# Patient Record
Sex: Male | Born: 1955
Health system: Southern US, Community
[De-identification: ages and names within clinical notes are randomized; demographics above are authoritative.]

## PROBLEM LIST (undated history)

## (undated) DIAGNOSIS — F17200 Nicotine dependence, unspecified, uncomplicated: Secondary | ICD-10-CM

## (undated) DIAGNOSIS — M1990 Unspecified osteoarthritis, unspecified site: Secondary | ICD-10-CM

## (undated) DIAGNOSIS — I10 Essential (primary) hypertension: Secondary | ICD-10-CM

## (undated) DIAGNOSIS — E785 Hyperlipidemia, unspecified: Secondary | ICD-10-CM

## (undated) DIAGNOSIS — G4730 Sleep apnea, unspecified: Secondary | ICD-10-CM

## (undated) DIAGNOSIS — R079 Chest pain, unspecified: Secondary | ICD-10-CM

## (undated) DIAGNOSIS — G510 Bell's palsy: Secondary | ICD-10-CM

## (undated) HISTORY — DX: Unspecified osteoarthritis, unspecified site: M19.90

## (undated) HISTORY — DX: Sleep apnea, unspecified: G47.30

## (undated) HISTORY — DX: Nicotine dependence, unspecified, uncomplicated: F17.200

## (undated) HISTORY — DX: Bell's palsy: G51.0

## (undated) HISTORY — DX: Essential (primary) hypertension: I10

## (undated) HISTORY — DX: Hyperlipidemia, unspecified: E78.5

## (undated) HISTORY — PX: NECK SURGERY: SHX720

## (undated) HISTORY — DX: Chest pain, unspecified: R07.9

---

## 1990-03-23 HISTORY — PX: WRIST SURGERY: SHX841

## 1998-01-29 ENCOUNTER — Ambulatory Visit (HOSPITAL_BASED_OUTPATIENT_CLINIC_OR_DEPARTMENT_OTHER): Admission: RE | Admit: 1998-01-29 | Discharge: 1998-01-29 | Payer: Self-pay

## 1998-04-22 ENCOUNTER — Ambulatory Visit (HOSPITAL_BASED_OUTPATIENT_CLINIC_OR_DEPARTMENT_OTHER): Admission: RE | Admit: 1998-04-22 | Discharge: 1998-04-22 | Payer: Self-pay

## 1998-04-23 HISTORY — PX: HERNIA REPAIR: SHX51

## 1999-07-09 ENCOUNTER — Emergency Department (HOSPITAL_COMMUNITY): Admission: EM | Admit: 1999-07-09 | Discharge: 1999-07-09 | Payer: Self-pay | Admitting: Emergency Medicine

## 2002-05-22 ENCOUNTER — Ambulatory Visit (HOSPITAL_COMMUNITY): Admission: RE | Admit: 2002-05-22 | Discharge: 2002-05-22 | Payer: Self-pay | Admitting: Sports Medicine

## 2002-05-22 ENCOUNTER — Encounter: Payer: Self-pay | Admitting: Chiropractic Medicine

## 2002-06-20 ENCOUNTER — Inpatient Hospital Stay (HOSPITAL_COMMUNITY): Admission: RE | Admit: 2002-06-20 | Discharge: 2002-06-21 | Payer: Self-pay | Admitting: Neurosurgery

## 2002-06-20 ENCOUNTER — Encounter: Payer: Self-pay | Admitting: Neurosurgery

## 2004-10-26 ENCOUNTER — Emergency Department (HOSPITAL_COMMUNITY): Admission: EM | Admit: 2004-10-26 | Discharge: 2004-10-26 | Payer: Self-pay | Admitting: Emergency Medicine

## 2005-11-18 ENCOUNTER — Emergency Department (HOSPITAL_COMMUNITY): Admission: EM | Admit: 2005-11-18 | Discharge: 2005-11-18 | Payer: Self-pay | Admitting: Family Medicine

## 2005-12-22 ENCOUNTER — Ambulatory Visit (HOSPITAL_COMMUNITY): Admission: RE | Admit: 2005-12-22 | Discharge: 2005-12-22 | Payer: Self-pay | Admitting: Chiropractic Medicine

## 2006-01-22 ENCOUNTER — Ambulatory Visit (HOSPITAL_COMMUNITY): Admission: RE | Admit: 2006-01-22 | Discharge: 2006-01-23 | Payer: Self-pay | Admitting: Orthopaedic Surgery

## 2008-06-21 ENCOUNTER — Emergency Department (HOSPITAL_COMMUNITY): Admission: EM | Admit: 2008-06-21 | Discharge: 2008-06-21 | Payer: Self-pay | Admitting: Emergency Medicine

## 2008-06-21 DIAGNOSIS — G510 Bell's palsy: Secondary | ICD-10-CM

## 2008-06-21 HISTORY — DX: Bell's palsy: G51.0

## 2010-04-12 ENCOUNTER — Encounter: Payer: Self-pay | Admitting: Chiropractic Medicine

## 2010-08-08 NOTE — Op Note (Signed)
Shaun Lang, HOPFENSPERGER                ACCOUNT NO.:  [id number]   MEDICAL RECORD NO.:  [id number]          PATIENT TYPE:  OIB   LOCATION:  5015                         FACILITY:  [facility]   PHYSICIAN:  Sharolyn Douglas, M.D.        DATE OF BIRTH:  02-Mar-1956   DATE OF PROCEDURE:  01/22/2006  DATE OF DISCHARGE:  01/23/2006                                 OPERATIVE REPORT   DIAGNOSIS:  Cervical spondylotic myeloradiculopathy C6-C7 and C7-T1 below  the previous C5-C6 fusion.   PROCEDURE:  1. Exploration of C5-C6 fusion.  2. Anterior cervical discectomy C6-C7 and C7-T1 with decompression of the      spinal cord and nerve roots bilaterally.  3. Anterior cervical arthrodesis C6-C7 and C7-T1 with placement of two      allograft prosthesis spacers packed with local autogenous bone graft.  4. Anterior cervical plating C6 to T1 using the Abbott spine system.   SURGEON:  Sharolyn Douglas, M.D.   ASSISTANT:  Colleen Mahar, P.A.-C.   ANESTHESIA:  General endotracheal.   ESTIMATED BLOOD LOSS:  Minimal.   COMPLICATIONS:  None.   COUNTS:  Needle and sponge count correct.   INDICATIONS:  The patient is a pleasant 55 year old male with progressive  neck and bilateral upper extremity pain consistent with a  myeloradiculopathy.  He has had a previous C5-C6 fusion done by another  surgeon using a BAK cage.  He has developed severe adjacent degenerative  changes with spinal stenosis and foraminal narrowing.  It is unclear from  his radiographs whether the fusion at C5-C6 is fused.  He now presents for  exploration of the fusion at C5-C6, extension of the decompression and  fusion across C6-C7 and C7-T1 anteriorly.  The risks, benefits, and  alternatives were reviewed and he elected to proceed.   DESCRIPTION OF PROCEDURE:  After informed consent, he was taken to the  operating room.  He underwent general endotracheal anesthesia without  difficulty and given prophylactic IV antibiotics.  He was carefully  positioned with his neck in slight extension.  5 pounds of halter traction  applied.  The neck was prepped and draped in the usual sterile fashion.  A  transverse incision was made just below the cricoid cartilage.  Dissection  was carried sharply through the platysma muscle in a longitudinal fashion.  There was quite a bit of scar from the previous operation and great care was  taken to develop the plane between the SCM and strap muscles medially.  The  prevertebral space was entered and, again, there was scarring noted from the  previous operation.  This was dissected bluntly.  We then exposed the  anterior cervical spine.  There was an osteophyte at C6-C7 which was easily  identifiable.  At C5-C6, the previous anterior cervical fusion could be  identified.  A spinal needle was placed in C6-C7 to confirm the level.  We  then completed careful exposure of the previous fusion at C5-C6.  We found  that there was some bridging bone across the disc space anteriorly and could  not identify any evidence  of a pseudoarthrosis.  We then completed  additional exposure of the C6-C7 and C7-T1 disc spaces by elevating the  longus coli muscle.  Deep Shadowline retractors were placed.  The esophagus,  trachea, and carotid sheath were identified and protected at all times.  Caspar distraction pins were placed in the C6, C7 and T1 vertebral bodies.  Gentle distraction applied.  The microscope was draped and brought into the  field.  The remainder of the operation was done directly under the  microscope.  We then completed discectomies at C6-C7 and C7-T1.  At C6-C7,  the disc was extremely narrowed and degenerative.  The uncovertebral joints  and posterior vertebral margins were taken down using a high speed bur.  This bone was saved for later autografting.  We then completed wide  foraminotomies using the micro-Kerrison punches.  The posterior longitudinal  ligament was taken down.  Once we were satisfied  with the decompression, we  placed a 6-mm allograft prosthesis spacer which had been packed with local  bone graft obtained from the drill.  This was countersunk 2 mm into the  interspace.  We had good distraction.  We then performed a similar procedure  at C7-T1.  At this level, the disc space was a little bit higher, although  still quite degenerative.  The uncovertebral joints were drilled out and  wide foraminotomies were completed, the posterior longitudinal ligament  taken down, and the spinal canal decompressed.  We placed a 7 mm allograft  prosthesis spacer packed with local bone graft at this level and again  countersunk it 2 mm.  We then turned our attention to placing an anterior  cervical plate from C6 to T1 which measured 44 mm.  We utilized six 13 mm  screws with good purchase.  We ensured that the locking mechanism engaged.  Hemostasis was achieved.  A deep Penrose drain was left in place.  The  platysma was closed with interrupted 2-0 Vicryl, the subcutaneous layer  closed with interrupted 3-0 Vicryl, followed by a running 4-0 subcuticular  Vicryl suture on the skin edges.  Benzoin and Steri-Strips placed.  A  sterile dressing was applied.  The patient was then transferred to recovery  in stable condition able to move his upper and lower extremities.   It should be noted my assistant [facility], P.A.-C., was present  throughout the procedure including the positioning, the exposure, the  decompression, the instrumentation and she also assisted with wound closure.      Sharolyn Douglas, M.D.  Electronically Signed     MC/MEDQ  D:  01/22/2006  T:  01/23/2006  Job:  [id number]

## 2010-08-08 NOTE — Op Note (Signed)
Shaun Lang, Shaun Lang                          ACCOUNT NO.:  [id number]   MEDICAL RECORD NO.:  [id number]                   PATIENT TYPE:  INP   LOCATION:  3172                                 FACILITY:  [facility]   PHYSICIAN:  Clydene Fake, M.D.               DATE OF BIRTH:  1955/10/18   DATE OF PROCEDURE:  06/20/2002  DATE OF DISCHARGE:                                 OPERATIVE REPORT   PREOPERATIVE DIAGNOSIS:  Herniated nucleus pulposus, spondylosis, cord  compression, myelopathy.   POSTOPERATIVE DIAGNOSIS:  Herniated nucleus pulposus, spondylosis, cord  compression, myelopathy.   OPERATION PERFORMED:  Anterior cervical decompression and diskectomy with  fusion at C5-6 with a BAK/C cage.   SURGEON:  Clydene Fake, M.D.   ASSISTANT:  Danae Orleans. Venetia Maxon, M.D.   ANESTHESIA:  General endotracheal tube.   ESTIMATED BLOOD LOSS:  None.   BLOOD REPLACED:  None.   DRAINS:  None.   COMPLICATIONS:  None.   INDICATIONS FOR PROCEDURE:  The patient is a 55 year old right-handed  gentleman with a neck injury four or five weeks ago and has posterior neck  pain with trouble moving his right arm, moving his fingers, some burning  like pain down the arm and some problems __________ gait.  Some of burning  pains resolved, the weakness persists.  MRI was done showing cord contusion  at 5-6 with severe stenosis at that level due to the spondylosis and HNP.  The patient brought in for decompression and fusion at this level.   DESCRIPTION OF PROCEDURE:  The patient was brought to the operating room and  general anesthesia induced.  The patient was placed in halter traction with  10 pounds, prepped and draped in the usual sterile fashion.  Site of  incision was injected with 10 ml of 1% lidocaine with epinephrine.  An  incision was then made in the left side of the neck from the midline to the  anterior border of the sternocleidomastoid muscle and incision taken down to  the platysma and  hemostasis obtained with Bovie cauterization.  The platysma  was incised with the Bovie with blunt dissection taken from the anterior  cervical fascia to the anterior cervical spine.  Needle was placed in the  interspace.  Fluoroscopy was used and this confirmed we were at the 5-6  level.  The disk space was incised with a 16 blade.  The needle was removed.  The longus colli muscle was reflected laterally on each side using the Bovie  and self-retaining retractor system placed.  The osteophytes over the disk  space were then removed with an osteophyte remover.  Partial diskectomy  performed with pituitary rongeurs.  Distraction pins were placed in the C5  and 6 and the interspace distracted as we started the diskectomy.  Using  high speed drill, curets and pituitary rongeurs, diskectomy was performed  taking it down to posterior  longitudinal ligament.  The space was extremely  deep.  A high speed drill was used to remove the cartilaginous end plate on  each side opening up the interspace.  The posterior longitudinal ligament  along with posterior osteophytes was removed with 1 to 2 mm Kerrison  punches.  A 1 mm Kerrison punch broke, the small end, was there in the  epidural space as we were attempting to pull it out.  It was sucked up the  sucker and was not found.  At the end of the case, x-rays showed no metal in  the epidural space at that level.  After a good decompression of the central  thecal sac and bilateral foramen with the Kerrison punches, the wound was  irrigated with antibiotic solution.  The interspace was measured and a 7 mm  paddle for a 10 mm cage was tapped into place over the 5-6 interspace for a  BAK/C cage.  The interspace was reamed down to 20 mm.  We still had plenty  of bone left, about 30 mm, the vertebral body.  Both end plates were fairly  symmetrical in the reaming.  All bone was shaved and placed into a 10 mm  BAK/C cage.  The interspace was tapped and then the  cage was placed down the  __________  was then removed and the cage was put in the final position and  countersunk 3 mm.  Fluoroscopic imaging AP and lateral was done showing good  position of the cage.  The wound was irrigated with antibiotic solution.  Retractors were removed with good hemostasis and the platysma was closed  with 3-0 Vicryl interrupted sutures, the subcutaneous tissue was closed with  the same, the skin closed with Benzoin and Steri-Strips.  Dry sterile  dressing was placed.  Patient was placed in a soft sterile collar and gently  awoke from anesthesia and transferred to recovery room in stable condition.                                               Clydene Fake, M.D.    JRH/MEDQ  D:  06/20/2002  T:  06/20/2002  Job:  [id number]

## 2011-12-23 ENCOUNTER — Encounter (INDEPENDENT_AMBULATORY_CARE_PROVIDER_SITE_OTHER): Payer: Self-pay | Admitting: General Surgery

## 2011-12-23 ENCOUNTER — Ambulatory Visit (INDEPENDENT_AMBULATORY_CARE_PROVIDER_SITE_OTHER): Payer: Medicaid Other | Admitting: General Surgery

## 2011-12-23 VITALS — BP 130/86 | HR 70 | Temp 99.4°F | Ht 68.0 in | Wt 192.0 lb

## 2011-12-23 DIAGNOSIS — R1032 Left lower quadrant pain: Secondary | ICD-10-CM

## 2011-12-23 MED ORDER — PREGABALIN 75 MG PO CAPS: 75.0000 mg | ORAL_CAPSULE | Freq: Two times a day (BID) | ORAL | Status: DC

## 2011-12-23 NOTE — Progress Notes (Signed)
Subjective:     Patient ID: Shaun Lang, male   DOB: 1955/10/01, 56 y.o.   MRN: [id number]  HPI Patient is status post left inguinal hernia repair with mesh by Dr. Orpah Greek In 2000. About 6 months to a year ago he did develop pain around his external inguinal canal. It comes and goes. He has not noticed a mass.He's had no change in bowel or bladder habits. The pain is localized in his left inguinal region medially. It is exacerbated by laying on his left side.  Review of Systems  Constitutional: Negative for fever, chills and unexpected weight change.  HENT: Negative for hearing loss, congestion, sore throat, trouble swallowing and voice change.   Eyes: Negative for visual disturbance.  Respiratory: Negative for cough and wheezing.   Cardiovascular: Negative for chest pain, palpitations and leg swelling.  Gastrointestinal: Negative for nausea, vomiting, abdominal pain, diarrhea, constipation, blood in stool, abdominal distention, anal bleeding and rectal pain.       See history of present illness  Genitourinary: Negative for hematuria and difficulty urinating.  Musculoskeletal: Negative for arthralgias.  Skin: Negative for rash and wound.  Neurological: Negative for seizures, syncope, weakness and headaches.       Chronic pain from cervical disc disease  Hematological: Negative for adenopathy. Does not bruise/bleed easily.  Psychiatric/Behavioral: Negative for confusion.       Objective:   Physical Exam  Constitutional: He is oriented to person, place, and time. He appears well-developed and well-nourished.  HENT:  Head: Normocephalic and atraumatic.  Eyes: EOM are normal. Pupils are equal, round, and reactive to light.  Neck: Normal range of motion. Neck supple.  Cardiovascular: Normal rate, regular rhythm and intact distal pulses.   Pulmonary/Chest: Effort normal. No respiratory distress. He has no wheezes. He has no rales.  Abdominal: Soft. He exhibits no distension. There is no  tenderness. There is no rebound.       Inguinal exam reveals no evidence of left inguinal hernia. There is tenderness over his external ring area. No masses felt. Both testes are descended. No evidence of right inguinal hernia.  Genitourinary: Penis normal. No penile tenderness.  Musculoskeletal: Normal range of motion.  Neurological: He is alert and oriented to person, place, and time.       Assessment:     Left inguinal pain likely due to nerve inflammation with scar tissue from previous left renal hernia repair with mesh   Plan:     Lyrica 75 mg by mouth twice a day. I cautioned the patient to see how it affects him before he drives. This is usually effective in decreasing this nerve inflammation and pain. I will see him back in 6 weeks. We will plan a 2 month course of Lyrica.

## 2011-12-24 ENCOUNTER — Ambulatory Visit (INDEPENDENT_AMBULATORY_CARE_PROVIDER_SITE_OTHER): Payer: Medicaid Other | Admitting: General Surgery

## 2012-01-13 ENCOUNTER — Other Ambulatory Visit (INDEPENDENT_AMBULATORY_CARE_PROVIDER_SITE_OTHER): Payer: Self-pay | Admitting: General Surgery

## 2012-01-13 MED ORDER — PREGABALIN 75 MG PO CAPS: 75.0000 mg | ORAL_CAPSULE | Freq: Two times a day (BID) | ORAL | Status: DC

## 2012-02-03 ENCOUNTER — Ambulatory Visit (INDEPENDENT_AMBULATORY_CARE_PROVIDER_SITE_OTHER): Payer: Medicaid Other | Admitting: General Surgery

## 2012-02-03 ENCOUNTER — Encounter (INDEPENDENT_AMBULATORY_CARE_PROVIDER_SITE_OTHER): Payer: Self-pay | Admitting: General Surgery

## 2012-02-03 VITALS — BP 140/90 | HR 74 | Temp 97.0°F | Resp 18 | Ht 68.0 in | Wt 189.8 lb

## 2012-02-03 DIAGNOSIS — R1032 Left lower quadrant pain: Secondary | ICD-10-CM

## 2012-02-03 MED ORDER — PREGABALIN 75 MG PO CAPS: 75.0000 mg | ORAL_CAPSULE | Freq: Two times a day (BID) | ORAL | Status: DC

## 2012-02-03 NOTE — Progress Notes (Signed)
Subjective:     Patient ID: Shaun Lang, male   DOB: December 21, 1955, 56 y.o.   MRN: [id number]  HPI Patient is status post left anal hernia repair by Dr. Orpah Greek in 2000. I saw him last month for some left inguinal pain the seems neuropathic. He started on Lyrica. He is tolerating this fairly well. The pain is reduced but not completely resolved.  Review of Systems     Objective:   Physical Exam  Constitutional: He is oriented to person, place, and time. He appears well-developed and well-nourished.  Pulmonary/Chest: Effort normal. No respiratory distress.  Neurological: He is alert and oriented to person, place, and time.  Skin: Skin is warm and dry.  Inguinal exam reveals no evidence of right inguinal hernia. Both testes are descended. No evidence of left inguinal hernia. There is pain palpating over his canal and on the left lateral aspect of his pubic tubercle.     Assessment:     Left inguinal pain status post left inguinal hernia repair with mesh likely due to nerve inflammation with scar tissue, improving on their    Plan:     I refilled his Lyrica and I will see him back in 6 weeks. Hopefully this will completely resolve. Otherwise we will pursue physical therapy or pain medicine referral.

## 2012-03-09 ENCOUNTER — Encounter (INDEPENDENT_AMBULATORY_CARE_PROVIDER_SITE_OTHER): Payer: Self-pay | Admitting: General Surgery

## 2012-03-09 ENCOUNTER — Ambulatory Visit (INDEPENDENT_AMBULATORY_CARE_PROVIDER_SITE_OTHER): Payer: Medicaid Other | Admitting: General Surgery

## 2012-03-09 VITALS — BP 130/82 | HR 72 | Temp 98.2°F | Resp 18 | Ht 68.0 in | Wt 183.0 lb

## 2012-03-09 DIAGNOSIS — R1032 Left lower quadrant pain: Secondary | ICD-10-CM

## 2012-03-09 NOTE — Progress Notes (Signed)
Subjective:     Patient ID: Shaun Lang, male   DOB: 08-20-1955, 56 y.o.   MRN: [id number]  HPI Patient status post left inguinal hernia repair by Dr. Rosamaria Lints. He developed some neuropathic pain. It is improved but not resolved on Lyrica.  Review of Systems     Objective:   Physical Exam Awake and alert Abdomen soft and nontender. Left inguinal exam reveals no evidence of recurrent left ankle hernia, there is tenderness at the external canal on palpation, no testicular tenderness or swelling, no evidence of infection    Assessment:     Left inguinal pain with history of left ankle hernia repair in the past, likely neuropathic in origin    Plan:     We will refer for physical therapy. I also offered pain clinic referral for injections. Patient would prefer to try physical therapy option. There is a local therapy office that offers this treatment. I will contact him and make the arrangements.  Return in 2 months.

## 2012-03-10 ENCOUNTER — Other Ambulatory Visit: Payer: Self-pay | Admitting: General Surgery

## 2012-03-10 ENCOUNTER — Telehealth (INDEPENDENT_AMBULATORY_CARE_PROVIDER_SITE_OTHER): Payer: Self-pay

## 2012-03-10 DIAGNOSIS — R1032 Left lower quadrant pain: Secondary | ICD-10-CM

## 2012-03-10 NOTE — Telephone Encounter (Signed)
I called and let the pt know that Dr Janee Morn ordered outpt therapy for his inguinal pain.  The referral is made.  Their # is [phone].  I asked the pt to call.

## 2012-05-11 ENCOUNTER — Other Ambulatory Visit (INDEPENDENT_AMBULATORY_CARE_PROVIDER_SITE_OTHER): Payer: Self-pay

## 2012-05-11 ENCOUNTER — Ambulatory Visit (INDEPENDENT_AMBULATORY_CARE_PROVIDER_SITE_OTHER): Payer: Medicaid Other | Admitting: General Surgery

## 2012-05-11 ENCOUNTER — Encounter (INDEPENDENT_AMBULATORY_CARE_PROVIDER_SITE_OTHER): Payer: Self-pay | Admitting: General Surgery

## 2012-05-11 VITALS — BP 128/82 | HR 66 | Temp 97.2°F | Resp 18 | Ht 68.0 in | Wt 186.0 lb

## 2012-05-11 DIAGNOSIS — R1030 Lower abdominal pain, unspecified: Secondary | ICD-10-CM

## 2012-05-11 DIAGNOSIS — R1032 Left lower quadrant pain: Secondary | ICD-10-CM

## 2012-05-11 MED ORDER — PREGABALIN 75 MG PO CAPS: 75.0000 mg | ORAL_CAPSULE | Freq: Two times a day (BID) | ORAL | Status: DC

## 2012-05-11 NOTE — Progress Notes (Addendum)
Subjective:     Patient ID: Shaun Lang, male   DOB: August 22, 1955, 57 y.o.   MRN: [id number]  HPI Patient is here for followup for left inguinal pain. 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 I referred him for physical therapy. He chose not to go. He is been taking Lyrica 75 mg twice a day with some improvement but not complete relief. He still having some throbbing intermittent pain in his left groin medially near his pubic tubercle.  Review of Systems     Objective:   Physical Exam  Constitutional: He appears well-developed and well-nourished. No distress.  Cardiovascular: Normal rate.   Pulmonary/Chest: Effort normal. No respiratory distress.  Abdominal: Soft. He exhibits no distension and no mass. There is no tenderness. There is no rebound and no guarding.  Right inguinal exam reveals no evidence of inguinal hernia, left inguinal exam reveals no evidence of recurrent left inguinal hernia, there is some tenderness lateral to his pubic tubercle  Genitourinary: Penis normal.       Assessment:     Left internal pain with history of left inguinal hernia repair, likely neuropathic, incomplete response to Lyrica    Plan:     I discussed the benefits of physical therapy again with the patient. I was offered referral to a pain clinic for injection. He has agreed to this and we will arrange. We also discussed the possibility of surgery to cut the nerve. He would like to avoid that. I refilled his Lyrica. I will see him back in 6 weeks. We will contact him tomorrow with the appointment time for his injection.

## 2012-05-11 NOTE — Patient Instructions (Addendum)
We will contact you tomorrow regarding pain clinic referral

## 2012-05-12 ENCOUNTER — Telehealth (INDEPENDENT_AMBULATORY_CARE_PROVIDER_SITE_OTHER): Payer: Self-pay

## 2012-05-12 NOTE — Telephone Encounter (Signed)
I called the pt and let him know that the pain clinic referral was made and I sent all his records to them.  They will call him with an appointment.

## 2012-05-16 ENCOUNTER — Telehealth (INDEPENDENT_AMBULATORY_CARE_PROVIDER_SITE_OTHER): Payer: Self-pay

## 2012-05-16 NOTE — Telephone Encounter (Signed)
I received a fax from [facility] for refill on Lyrica  75mg  po bid #60 and that I need to get prior authorization.  I got the ok from Dr Janee Morn to refill plus one additional.  I called [phone] to get prior authorization.  They gave me a confirmation # of [id number] and said I could check on that in 24 hrs.  I called in the refill and auth # to [facility] [id number].

## 2012-05-18 ENCOUNTER — Encounter: Payer: Self-pay | Admitting: Physical Medicine & Rehabilitation

## 2012-06-01 ENCOUNTER — Ambulatory Visit: Payer: Self-pay | Admitting: Physical Medicine & Rehabilitation

## 2012-06-01 ENCOUNTER — Encounter: Payer: Medicaid Other | Attending: Physical Medicine & Rehabilitation | Admitting: Physical Medicine & Rehabilitation

## 2012-06-01 ENCOUNTER — Encounter: Payer: Self-pay | Admitting: Physical Medicine & Rehabilitation

## 2012-06-01 VITALS — BP 128/77 | HR 83 | Resp 17 | Ht 68.0 in | Wt 185.0 lb

## 2012-06-01 DIAGNOSIS — G5792 Unspecified mononeuropathy of left lower limb: Secondary | ICD-10-CM

## 2012-06-01 DIAGNOSIS — G8928 Other chronic postprocedural pain: Secondary | ICD-10-CM | POA: Insufficient documentation

## 2012-06-01 DIAGNOSIS — R109 Unspecified abdominal pain: Secondary | ICD-10-CM | POA: Insufficient documentation

## 2012-06-01 DIAGNOSIS — E785 Hyperlipidemia, unspecified: Secondary | ICD-10-CM | POA: Insufficient documentation

## 2012-06-01 DIAGNOSIS — G5790 Unspecified mononeuropathy of unspecified lower limb: Secondary | ICD-10-CM

## 2012-06-01 DIAGNOSIS — I10 Essential (primary) hypertension: Secondary | ICD-10-CM | POA: Insufficient documentation

## 2012-06-01 DIAGNOSIS — F17200 Nicotine dependence, unspecified, uncomplicated: Secondary | ICD-10-CM | POA: Insufficient documentation

## 2012-06-01 DIAGNOSIS — R1032 Left lower quadrant pain: Secondary | ICD-10-CM

## 2012-06-01 DIAGNOSIS — Z5181 Encounter for therapeutic drug level monitoring: Secondary | ICD-10-CM

## 2012-06-01 HISTORY — DX: Unspecified mononeuropathy of unspecified lower limb: G57.90

## 2012-06-01 MED ORDER — PREGABALIN 75 MG PO CAPS: 75.0000 mg | ORAL_CAPSULE | Freq: Three times a day (TID) | ORAL | Status: DC

## 2012-06-01 NOTE — Progress Notes (Signed)
Subjective:    Patient ID: Shaun Lang, male    DOB: December 12, 1955, 57 y.o.   MRN: [id number]  HPI  This is a 57 yo AA male who developed left inguinal pain in the 90's. He developed an inguinal hernia which was repaired in 2000 by Dr. Orpah Greek. 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 He was laid off the job, and hasn't been back to work since the (at least regular work.)  He began to notice his pain began increase again in 2004.   The pain bothers him when he sits up in a straight chair or if he lies on his left side. It will bother him during sexual intercourse as well. He notices it while he walks, but the pain is not serious. The pain does not radiate into the inner thigh. The pain is very similar to the pain he was feeling when he had his initial hernia problem. None of the medications he is taking including hydrocodone, flexeril, meloxicam, lyrica seem to have much effect. If anything the hydrocodone helps the most.  He has not tried any stretching exercises, heat, ice, etc. Sometimes positional changes while he sit Pain Inventory Average Pain 7 Pain Right Now 7 My pain is sharp, burning, stabbing and tingling  In the last 24 hours, has pain interfered with the following? General activity 6 Relation with others 7 Enjoyment of life 6 What TIME of day is your pain at its worst? morning, night time Sleep (in general) Poor  Pain is worse with: sitting Pain improves with: n/a Relief from Meds: 0  Mobility walk without assistance how many minutes can you walk? 1 hour ability to climb steps?  yes do you drive?  yes Do you have any goals in this area?  no  Function disabled: date disabled 10-18-11  Neuro/Psych weakness tingling spasms  Prior Studies Any changes since last visit?  no  Physicians involved in your care Primary care Dr Lerry Liner   Family History  Problem Relation Age of Onset  . Cancer Sister     breast   History   Social History  . Marital Status: Single    Spouse Name: N/A    Number of Children: N/A  . Years of Education: N/A   Social History Main Topics  . Smoking status: Current Every Day Smoker -- 1.00 packs/day    Types: Cigarettes  . Smokeless tobacco: None  . Alcohol Use: No  . Drug Use: No  . Sexually Active:    Other Topics Concern  . None   Social History Narrative  . None   Past Surgical History  Procedure Laterality Date  . Neck surgery  07/21/02 and 01/2006  . Hernia repair  04/1998    [facility]  . Wrist surgery  1992    right   Past Medical History  Diagnosis Date  . Hyperlipidemia   . Hypertension    BP 128/77  Pulse 83  Resp 17  Ht 5\' 8"  (1.727 m)  Wt 185 lb (83.915 kg)  BMI 28.14 kg/m2  SpO2 100%      Review of Systems  Constitutional: Positive for diaphoresis and appetite change.  Neurological: Positive for weakness.       Tingling,spasms  All other systems reviewed and are negative.       Objective:   Physical  Exam  General: Alert and oriented x 3, No apparent distress HEENT: Head is normocephalic, atraumatic, PERRLA, EOMI, sclera anicteric, oral mucosa pink and moist, dentition intact, ext ear canals clear,  Neck: Supple without JVD or lymphadenopathy Heart: Reg rate and rhythm. No murmurs rubs or gallops Chest: CTA bilaterally without wheezes, rales, or rhonchi; no distress Abdomen: Soft, non-tender, non-distended, bowel sounds positive. Extremities: No clubbing, cyanosis, or edema. Pulses are 2+ Skin: Clean and intact without signs of breakdown Neuro: Pt is cognitively appropriate with normal insight, memory, and awareness. Cranial nerves 2-12 are intact. Sensory exam is normal. Reflexes are 2+ in all 4's. Fine motor coordination is intact. No tremors. Motor function is grossly 5/5.  Musculoskeletal: tenderness at the left inguinal line with  palpable mesh just medial to the ASIS and superior to the inguinal ligament. There is significant pain with palpation but no consistent referral elsewhere. Pain is a bit more pronounced with lumbar flexion especailly to the left. Posture is fair.  Psych: Pt's affect is appropriate. Pt is cooperative         Assessment & Plan:  1. Chronic left inguinal pain- appears to be related to his prior inguinal hernia (indirect) and mesh repair. He may have some pain in the ilioinguinal neuralgia as well, but I favor this pain being more related to scar tissue, muscle imbalance, posture, etc. 2. Tobacco abuse   Plan: 1. Made a referral to [facility] on [facility] to address core muscle, pelvic, low back, abdominal posture, ROM, strength. I will also have them try ultrasound, deep tissue massage, scar tissue mobilization, heat/ice, etc.  2. Resume regular use of meloxicam 15mg  qd with food 3. Increase lyrica to 75 mg tid for any neuropathic pain component 4. Consider ilioinguinal nerve block although the effects of this would only be temporary unless #1 is addressed in full. 5. Follow up with me in 6 weeks. 30 minutes of face to face patient care time were spent during this visit. All questions were encouraged and answered.

## 2012-06-01 NOTE — Patient Instructions (Signed)
TAKE YOUR LYRICA THREE X PER DAY.    TAKE MELOXICAM EACH DAY WITH BREAKFAST

## 2012-06-01 NOTE — Addendum Note (Signed)
Addended by: Doreene Eland on: 06/01/2012 12:49 PM   Modules accepted: Orders

## 2012-06-09 ENCOUNTER — Ambulatory Visit: Payer: Medicaid Other | Attending: Physical Medicine & Rehabilitation | Admitting: Physical Therapy

## 2012-06-09 DIAGNOSIS — M545 Low back pain, unspecified: Secondary | ICD-10-CM | POA: Insufficient documentation

## 2012-06-09 DIAGNOSIS — IMO0001 Reserved for inherently not codable concepts without codable children: Secondary | ICD-10-CM | POA: Insufficient documentation

## 2012-06-09 DIAGNOSIS — M25559 Pain in unspecified hip: Secondary | ICD-10-CM | POA: Insufficient documentation

## 2012-06-10 ENCOUNTER — Telehealth: Payer: Self-pay

## 2012-06-10 NOTE — Telephone Encounter (Signed)
Message copied by Judd Gaudier on Fri Jun 10, 2012  9:19 AM ------      Message from: Su Monks      Created: Thu Jun 09, 2012  3:37 PM       Please educate patient, he also has to be educated at his next visit, and also tell him that if this happens again it would not be safe for [place] to prescribe narcotics. ------

## 2012-06-10 NOTE — Telephone Encounter (Signed)
Left message for patient to call office regarding urine drug screen results. 

## 2012-06-14 NOTE — Telephone Encounter (Signed)
Patient informed he should not drink alcohol while taking narcotic medications.  He tried to get Lyrica filled but he said the pharmacy needed to contact us regarding the script.  Per [facility] lyrica needs prior authorization.  They will refax request.

## 2012-06-15 ENCOUNTER — Telehealth: Payer: Self-pay

## 2012-06-15 MED ORDER — GABAPENTIN 300 MG PO CAPS: 300.0000 mg | ORAL_CAPSULE | Freq: Three times a day (TID) | ORAL | Status: DC

## 2012-06-15 NOTE — Telephone Encounter (Signed)
Gabapentin refilled.  Left message advising patient gabapentin was sent to pharmacy to replace lyrica.

## 2012-06-15 NOTE — Telephone Encounter (Signed)
Patients insurance will not cover lyrica.  He has to try and fail gabapentin, carbamazepine, tricyclic antidepressant or valproic acid.  Per patient he has not tried any of these.  Please advise.

## 2012-06-15 NOTE — Telephone Encounter (Signed)
i used/increased lyrica as that was what he came to office on.  Gabapentin 300mg  tid #90

## 2012-06-17 ENCOUNTER — Ambulatory Visit: Payer: Medicaid Other

## 2012-06-22 ENCOUNTER — Encounter (INDEPENDENT_AMBULATORY_CARE_PROVIDER_SITE_OTHER): Payer: Self-pay | Admitting: General Surgery

## 2012-06-22 ENCOUNTER — Ambulatory Visit (INDEPENDENT_AMBULATORY_CARE_PROVIDER_SITE_OTHER): Payer: Medicaid Other | Admitting: General Surgery

## 2012-06-22 ENCOUNTER — Encounter (INDEPENDENT_AMBULATORY_CARE_PROVIDER_SITE_OTHER): Payer: Medicaid Other | Admitting: General Surgery

## 2012-06-22 VITALS — BP 120/84 | HR 76 | Temp 97.7°F | Resp 16 | Ht 68.0 in | Wt 185.6 lb

## 2012-06-22 DIAGNOSIS — G5790 Unspecified mononeuropathy of unspecified lower limb: Secondary | ICD-10-CM

## 2012-06-22 DIAGNOSIS — R1032 Left lower quadrant pain: Secondary | ICD-10-CM

## 2012-06-22 DIAGNOSIS — G5792 Unspecified mononeuropathy of left lower limb: Secondary | ICD-10-CM

## 2012-06-22 NOTE — Progress Notes (Signed)
Subjective:     Patient ID: Shaun Lang, male   DOB: 11-03-1955, 57 y.o.   MRN: [id number]  HPI Patient presents for follow left ilioinguinal pain. He has a remote history of Left Inguinal hernia repair.I referred him to Dr. Hermelinda Medicus for further evaluation. He set up physical therapy. He is beginning that program tomorrow. The pain is improved. He was switched to Neurontin by Dr. Hermelinda Medicus due to [facility] coverage. He completed his Lyrica but then switched to Neurontin when his Lyrica ran out. Pain is significantly better but has not completely resolved. He still gets some achiness at night.  Review of Systems     Objective:   Physical Exam  Constitutional: He appears well-developed and well-nourished.  HENT:  Head: Normocephalic.  Neck: Normal range of motion. Neck supple. No tracheal deviation present.  Cardiovascular: Normal rate, normal heart sounds and intact distal pulses.   Pulmonary/Chest: Effort normal and breath sounds normal.  Abdominal: Soft. Bowel sounds are normal. He exhibits no distension. There is no tenderness. There is no rebound and no guarding.  No evidence of right inguinal hernia, no evidence of recurrent left anal hernia, tenderness persists along the external canal, testes descended and nonedematous  Genitourinary: Penis normal.       Assessment:     Left inguinal pain with history of left inguinal hernia repair    Plan:     Continue Neurontin and physical therapy plan as per Dr. Hermelinda Medicus. I will be happy to see him back as needed. I look forward to further followup from Dr. Hermelinda Medicus. His input is appreciated.

## 2012-06-23 ENCOUNTER — Ambulatory Visit: Payer: Medicaid Other | Admitting: Physical Therapy

## 2012-06-27 ENCOUNTER — Ambulatory Visit: Payer: Medicaid Other | Attending: Physical Medicine & Rehabilitation | Admitting: Physical Therapy

## 2012-06-27 DIAGNOSIS — M25559 Pain in unspecified hip: Secondary | ICD-10-CM | POA: Insufficient documentation

## 2012-06-27 DIAGNOSIS — IMO0001 Reserved for inherently not codable concepts without codable children: Secondary | ICD-10-CM | POA: Insufficient documentation

## 2012-06-27 DIAGNOSIS — M545 Low back pain, unspecified: Secondary | ICD-10-CM | POA: Insufficient documentation

## 2012-06-28 ENCOUNTER — Ambulatory Visit: Payer: Medicaid Other | Admitting: Physical Therapy

## 2012-06-30 ENCOUNTER — Ambulatory Visit: Payer: Medicaid Other | Admitting: Physical Therapy

## 2012-07-04 ENCOUNTER — Ambulatory Visit: Payer: Medicaid Other | Admitting: Physical Therapy

## 2012-07-12 ENCOUNTER — Encounter: Payer: Medicaid Other | Attending: Physical Medicine & Rehabilitation | Admitting: Physical Medicine & Rehabilitation

## 2012-07-12 ENCOUNTER — Encounter: Payer: Self-pay | Admitting: Physical Medicine & Rehabilitation

## 2012-07-12 VITALS — BP 131/83 | HR 83 | Resp 14 | Ht 68.0 in | Wt 182.0 lb

## 2012-07-12 DIAGNOSIS — R1032 Left lower quadrant pain: Secondary | ICD-10-CM

## 2012-07-12 DIAGNOSIS — G5792 Unspecified mononeuropathy of left lower limb: Secondary | ICD-10-CM

## 2012-07-12 DIAGNOSIS — G5790 Unspecified mononeuropathy of unspecified lower limb: Secondary | ICD-10-CM

## 2012-07-12 MED ORDER — LIDOCAINE 5 % EX OINT
TOPICAL_OINTMENT | Freq: Three times a day (TID) | CUTANEOUS | Status: DC
Start: 2012-07-12 — End: 2012-09-08

## 2012-07-12 NOTE — Progress Notes (Signed)
Subjective:    Patient ID: Shaun Lang, male    DOB: 01-Nov-1955, 57 y.o.   MRN: [id number]  HPICurtis is back regarding his left inguinal pain. He had 3 visits with PT which he felt were very helpful. He was limited in further visits because of his MCD. His pain during the day is generally much better. He still will have pain when he sleeps sidelying to the left over the area. The pain generally remains localized to his mesh site with occasional referral medial towards the scrotum.  We had to change the lyrica to gabapentin per MCD request and he appears to have done fairly well with this.   Pain Inventory Average Pain 7 Pain Right Now 3 My pain is sharp, stabbing, tingling and aching  In the last 24 hours, has pain interfered with the following? General activity 7 Relation with others 7 Enjoyment of life 7 What TIME of day is your pain at its worst? morning and night Sleep (in general) Poor  Pain is worse with: bending, sitting and some activites Pain improves with: rest, heat/ice and therapy/exercise Relief from Meds: 7  Mobility walk without assistance how many minutes can you walk? 60 ability to climb steps?  yes do you drive?  no Do you have any goals in this area?  yes  Function disabled: date disabled 38 retired  Neuro/Psych bowel control problems  Prior Studies Any changes since last visit?  no  Physicians involved in your care Any changes since last visit?  no   Family History  Problem Relation Age of Onset  . Cancer Sister     breast   History   Social History  . Marital Status: Single    Spouse Name: N/A    Number of Children: N/A  . Years of Education: N/A   Social History Main Topics  . Smoking status: Current Every Day Smoker -- 1.00 packs/day    Types: Cigarettes  . Smokeless tobacco: None  . Alcohol Use: No  . Drug Use: No  . Sexually Active:    Other Topics Concern  . None   Social History Narrative  . None   Past Surgical  History  Procedure Laterality Date  . Neck surgery  07/21/02 and 01/2006  . Hernia repair  04/1998    [facility]  . Wrist surgery  1992    right   Past Medical History  Diagnosis Date  . Hyperlipidemia   . Hypertension    BP 131/83  Pulse 83  Resp 14  Ht 5\' 8"  (1.727 m)  Wt 182 lb (82.555 kg)  BMI 27.68 kg/m2  SpO2 99%     Review of Systems  All other systems reviewed and are negative.       Objective:   Physical Exam General: Alert and oriented x 3, No apparent distress  HEENT: Head is normocephalic, atraumatic, PERRLA, EOMI, sclera anicteric, oral mucosa pink and moist, dentition intact, ext ear canals clear,  Neck: Supple without JVD or lymphadenopathy  Heart: Reg rate and rhythm. No murmurs rubs or gallops  Chest: CTA bilaterally without wheezes, rales, or rhonchi; no distress  Abdomen: Soft, non-tender, non-distended, bowel sounds positive.  Extremities: No clubbing, cyanosis, or edema. Pulses are 2+  Skin: Clean and intact without signs of breakdown  Neuro: Pt is cognitively appropriate with normal insight, memory, and awareness. Cranial nerves 2-12 are intact. Sensory exam is normal. Reflexes are 2+ in all 4's. Fine motor coordination is intact. No tremors. Motor  function is grossly 5/5.  Musculoskeletal: mild tenderness at the left inguinal line with palpable mesh just medial to the ASIS and superior to the inguinal ligament. There is less  pain with palpation with no consistent referral elsewhere. Pain is  more pronounced with lumbar flexion especailly to the left. Posture is fair.  Psych: Pt's affect is appropriate. Pt is cooperative   Assessment & Plan:   1. Chronic left inguinal pain- appears to be related to his prior inguinal hernia (indirect) and mesh repair.  This pain appears to be more related to scar tissue, muscle imbalance, posture, etc. His pain has shown improvement since our last visit and tends to be more of a problem when he lays over or puts pressure  upon the area. 2. Tobacco abuse   Plan:  1. The patient will continue with the principles taught to him by PT. He needs to be regular with, ice/heat stretching, massage, desensitization, strengthening, posture. .  2. Continue regular use of meloxicam 15mg  qd with food  3. Continue neurontin at 300mg  tid.   4. left ilioinguinal nerve block is still in consideration although benefits likely to very transient given the likely etiology of the pain.   5. Will try lidocaine gel 5% to left groin area to give local relief at especially at night when he tries to sleep. 6. Follow up with me in 3 months. 15 minutes of face to face patient care time were spent during this visit. All questions were encouraged and answered.

## 2012-07-12 NOTE — Patient Instructions (Signed)
CONTINUE WORKING ON STRENGTHENING UP YOUR ABDOMEN AND LOW BACK. PRACTICE GOOD POSTURE. AVOID STRAINING.   WORK ON MANAGING YOUR CONSTIPATION AS WELL.   AVOID SLEEPING ON YOUR LEFT SIDE.

## 2012-07-18 HISTORY — PX: COLONOSCOPY: SHX174

## 2012-07-28 ENCOUNTER — Telehealth: Payer: Self-pay | Admitting: Physical Medicine & Rehabilitation

## 2012-07-28 NOTE — Telephone Encounter (Signed)
Patient came to window for a refill for his medication Gabapentin 300mg 

## 2012-07-29 MED ORDER — GABAPENTIN 300 MG PO CAPS: 300.0000 mg | ORAL_CAPSULE | Freq: Three times a day (TID) | ORAL | Status: DC

## 2012-07-29 NOTE — Telephone Encounter (Signed)
Medication refilled and sent to [facility]. Patient aware.

## 2012-09-06 ENCOUNTER — Encounter: Payer: Self-pay | Admitting: Cardiology

## 2012-09-06 DIAGNOSIS — M1990 Unspecified osteoarthritis, unspecified site: Secondary | ICD-10-CM

## 2012-09-06 DIAGNOSIS — I10 Essential (primary) hypertension: Secondary | ICD-10-CM

## 2012-09-06 DIAGNOSIS — G510 Bell's palsy: Secondary | ICD-10-CM

## 2012-09-06 DIAGNOSIS — Z720 Tobacco use: Secondary | ICD-10-CM | POA: Insufficient documentation

## 2012-09-06 DIAGNOSIS — R079 Chest pain, unspecified: Secondary | ICD-10-CM

## 2012-09-06 DIAGNOSIS — F17200 Nicotine dependence, unspecified, uncomplicated: Secondary | ICD-10-CM

## 2012-09-06 DIAGNOSIS — E785 Hyperlipidemia, unspecified: Secondary | ICD-10-CM | POA: Insufficient documentation

## 2012-09-07 ENCOUNTER — Encounter: Payer: Self-pay | Admitting: Cardiovascular Disease

## 2012-09-08 ENCOUNTER — Ambulatory Visit (INDEPENDENT_AMBULATORY_CARE_PROVIDER_SITE_OTHER): Payer: Medicaid Other | Admitting: Cardiovascular Disease

## 2012-09-08 ENCOUNTER — Encounter: Payer: Self-pay | Admitting: Cardiovascular Disease

## 2012-09-08 VITALS — BP 124/80 | HR 74 | Ht 68.0 in | Wt 191.0 lb

## 2012-09-08 DIAGNOSIS — R079 Chest pain, unspecified: Secondary | ICD-10-CM

## 2012-09-08 DIAGNOSIS — I10 Essential (primary) hypertension: Secondary | ICD-10-CM

## 2012-09-08 DIAGNOSIS — E785 Hyperlipidemia, unspecified: Secondary | ICD-10-CM

## 2012-09-08 NOTE — Assessment & Plan Note (Signed)
Well-controlled on current medications 

## 2012-09-08 NOTE — Assessment & Plan Note (Signed)
On statin therapy with an LDL of 59 and a total cholesterol of 113

## 2012-09-08 NOTE — Progress Notes (Signed)
09/08/2012 Shaun Lang   Aug 17, 1955  [id number]  Primary Physician Shaun Lesch, MD Primary Cardiologist: Shaun Gess MD Shaun Lang   HPI:  The patient is a 57 year old thin appearing divorced African American male father of 4, grandfather of 5 grandchildren who I last saw 3 months ago. He was referred for chest pain. He had a negative Myoview. His pain has essentially resolved. Risk factors include continued tobacco abuse of 1 pack per day, hypertension, hyperlipidemia. I did started him on atorvastatin. He is resistant to risk factor modification.TLS saw him 9 months ago he continues to smoke one pack per day. He denies chest pain or shortness of breath. He lipid profile performed 03/30/12 revealing a glucose of 113, LDL 59 and HDL of 40      Current Outpatient Prescriptions  Medication Sig Dispense Refill  . amLODipine (NORVASC) 5 MG tablet Take 5 mg by mouth daily.      [name] atorvastatin (LIPITOR) 20 MG tablet Take 20 mg by mouth daily.      . cyclobenzaprine (FLEXERIL) 10 MG tablet Take 10 mg by mouth 2 (two) times daily.      [name] gabapentin (NEURONTIN) 300 MG capsule Take 1 capsule (300 mg total) by mouth 3 (three) times daily.  90 capsule  1  . HYDROcodone-acetaminophen (NORCO) 10-325 MG per tablet Take 1 tablet by mouth every 6 (six) hours as needed for pain.      . meloxicam (MOBIC) 15 MG tablet Take 15 mg by mouth daily.       No current facility-administered medications for this visit.    No Known Allergies  History   Social History  . Marital Status: Single    Spouse Name: N/A    Number of Children: N/A  . Years of Education: N/A   Occupational History  . Not on file.   Social History Main Topics  . Smoking status: Current Every Day Smoker -- 1.00 packs/day    Types: Cigarettes  . Smokeless tobacco: Not on file  . Alcohol Use: No  . Drug Use: No  . Sexually Active: Not on file   Other Topics Concern  . Not on file   Social History  Narrative  . No narrative on file     Review of Systems: General: negative for chills, fever, night sweats or weight changes.  Cardiovascular: negative for chest pain, dyspnea on exertion, edema, orthopnea, palpitations, paroxysmal nocturnal dyspnea or shortness of breath Dermatological: negative for rash Respiratory: negative for cough or wheezing Urologic: negative for hematuria Abdominal: negative for nausea, vomiting, diarrhea, bright red blood per rectum, melena, or hematemesis Neurologic: negative for visual changes, syncope, or dizziness All other systems reviewed and are otherwise negative except as noted above.    Blood pressure 124/80, pulse 74, height 5\' 8"  (1.727 m), weight 191 lb (86.637 kg).  General appearance: alert and no distress Neck: no adenopathy, no carotid bruit, no JVD, supple, symmetrical, trachea midline and thyroid not enlarged, symmetric, no tenderness/mass/nodules Lungs: clear to auscultation bilaterally Heart: regular rate and rhythm, S1, S2 normal, no murmur, click, rub or gallop Extremities: extremities normal, atraumatic, no cyanosis or edema  EKG normal sinus rhythm at 74 with a left anterior fascicular block and left ventricular hypertrophy by voltage unchanged from prior EKGs  ASSESSMENT AND PLAN:   Hyperlipidemia On statin therapy with an LDL of 59 and a total cholesterol of 113  Hypertension Well-controlled on current medications      Shaun Gess  MD Shaun Lang, Shaun Lang 09/08/2012 10:17 AM

## 2012-09-08 NOTE — Patient Instructions (Addendum)
Dr. Allyson Sabal wants to see you back in 12 months.

## 2012-10-10 ENCOUNTER — Encounter: Payer: Medicaid Other | Attending: Physical Medicine & Rehabilitation | Admitting: Physical Medicine & Rehabilitation

## 2012-10-10 ENCOUNTER — Encounter: Payer: Self-pay | Admitting: Physical Medicine & Rehabilitation

## 2012-10-10 VITALS — BP 141/85 | HR 76 | Resp 14 | Ht 68.0 in | Wt 186.0 lb

## 2012-10-10 DIAGNOSIS — R1032 Left lower quadrant pain: Secondary | ICD-10-CM | POA: Insufficient documentation

## 2012-10-10 DIAGNOSIS — G5790 Unspecified mononeuropathy of unspecified lower limb: Secondary | ICD-10-CM | POA: Insufficient documentation

## 2012-10-10 DIAGNOSIS — G5792 Unspecified mononeuropathy of left lower limb: Secondary | ICD-10-CM

## 2012-10-10 MED ORDER — CYCLOBENZAPRINE HCL 10 MG PO TABS: 10.0000 mg | ORAL_TABLET | Freq: Two times a day (BID) | ORAL | Status: DC | PRN

## 2012-10-10 MED ORDER — HYDROCODONE-ACETAMINOPHEN 10-325 MG PO TABS: 1.0000 | ORAL_TABLET | Freq: Four times a day (QID) | ORAL | Status: DC | PRN

## 2012-10-10 NOTE — Progress Notes (Signed)
Subjective:    Patient ID: Shaun Lang, male    DOB: 07-18-1955, 57 y.o.   MRN: [id number]  HPI  Mr. Lampert is back regarding his groin pain. Hi pain continues to improve. It is down from 7 to a 5. Currently he's using some heat and ice as well as massage. He has also adjusted the position of his bed as well.   He maintains on the meloxicam and gabapentin for pain control. Sometimes he takes two gabapentin other days he takes 3 three. He uses meloxicam essentially PRN a few times a week.   Regarding his hydrocodone he takes about 10 per week. Typically he uses this most at night.     Pain Inventory Average Pain 5 Pain Right Now 5 My pain is dull, stabbing and tingling  In the last 24 hours, has pain interfered with the following? General activity 5 Relation with others 5 Enjoyment of life 5 What TIME of day is your pain at its worst? night Sleep (in general) Poor  Pain is worse with: bending and sitting Pain improves with: medication Relief from Meds: 7  Mobility how many minutes can you walk? 60 ability to climb steps?  yes do you drive?  yes  Function Do you have any goals in this area?  no  Neuro/Psych weakness spasms  Prior Studies Any changes since last visit?  no  Physicians involved in your care Any changes since last visit?  no   Family History  Problem Relation Age of Onset  . Cancer Sister     breast   History   Social History  . Marital Status: Single    Spouse Name: N/A    Number of Children: N/A  . Years of Education: N/A   Social History Main Topics  . Smoking status: Current Every Day Smoker -- 1.00 packs/day    Types: Cigarettes  . Smokeless tobacco: None  . Alcohol Use: No  . Drug Use: No  . Sexually Active: None   Other Topics Concern  . None   Social History Narrative  . None   Past Surgical History  Procedure Laterality Date  . Neck surgery  07/21/02 and 01/2006  . Hernia repair  04/1998    [facility]  . Wrist surgery  1992     right   Past Medical History  Diagnosis Date  . Hyperlipidemia   . Hypertension   . DJD (degenerative joint disease)     disabilty secondary to neck issues  . Bell's palsy April 2010  . Chest pain with low risk for cardiac etiology     Low Risk Myoview Sept 2013  . Smoker    BP 141/85  Pulse 76  Resp 14  Ht 5\' 8"  (1.727 m)  Wt 186 lb (84.369 kg)  BMI 28.29 kg/m2  SpO2 98%     Review of Systems  Respiratory: Positive for cough.   Gastrointestinal: Positive for constipation.  Neurological: Positive for weakness.  All other systems reviewed and are negative.       Objective:   Physical Exam General: Alert and oriented x 3, No apparent distress  HEENT: Head is normocephalic, atraumatic, PERRLA, EOMI, sclera anicteric, oral mucosa pink and moist, dentition intact, ext ear canals clear,  Neck: Supple without JVD or lymphadenopathy  Heart: Reg rate and rhythm. No murmurs rubs or gallops  Chest: CTA bilaterally without wheezes, rales, or rhonchi; no distress  Abdomen: Soft, non-tender, non-distended, bowel sounds positive.  Extremities: No clubbing, cyanosis,  or edema. Pulses are 2+  Skin: Clean and intact without signs of breakdown  Neuro: Pt is cognitively appropriate with normal insight, memory, and awareness. Cranial nerves 2-12 are intact. Sensory exam is normal. Reflexes are 2+ in all 4's. Fine motor coordination is intact. No tremors. Motor function is grossly 5/5.  Musculoskeletal: mild tenderness at the left inguinal line with palpable mesh just medial to the ASIS and superior to the inguinal ligament. There is minimal with palpation with no consistent referral elsewhere. Pain is more pronounced with lumbar flexion especailly to the left. Posture is fair.  Psych: Pt's affect is appropriate. Pt is cooperative    Assessment & Plan:   1. Chronic left inguinal pain- appears to be related to his prior inguinal hernia (indirect) and mesh repair. This pain appears to be  more related to scar tissue, muscle imbalance, posture, etc. His pain has shown improvement since our last visit and tends to be more of a problem when he lays over or puts pressure upon the area.  2. Tobacco abuse    Plan:  1. The patient will continue with the principles taught to him by PT. He needs to be regular with, ice/heat stretching, massage, desensitization, strengthening, posture. He needs to be reasonable with posture, technique, etc as well which we reviewed today.  2. Continue PRN USE ONLY of meloxicam 15mg    with food  3. Continue neurontin at 300mg  bid to tid.  4. left ilioinguinal nerve block is still in consideration although benefits likely to be very transient given the likely etiology of the pain. At this point I don't believe it will be necessary. 5. Refilled hydrocodone and flexeril today. A CSA was signed. 6. Follow up with me in 4 months. 15 minutes of face to face patient care time were spent during this visit. All questions were encouraged and answered.

## 2012-10-10 NOTE — Patient Instructions (Signed)
YOU NEED TO USE PROPER POSTURE AND TECHNIQUE. WORK ON REGULAR STRETCHING

## 2012-10-14 ENCOUNTER — Telehealth: Payer: Self-pay

## 2012-10-14 NOTE — Telephone Encounter (Signed)
Patient had questions about controlled stubstance agreement.  Copy mailed to him.

## 2012-10-25 ENCOUNTER — Other Ambulatory Visit: Payer: Self-pay | Admitting: Cardiovascular Disease

## 2012-10-25 NOTE — Telephone Encounter (Signed)
Rx was sent to pharmacy electronically. 

## 2012-10-31 DIAGNOSIS — M62838 Other muscle spasm: Secondary | ICD-10-CM | POA: Diagnosis not present

## 2012-10-31 DIAGNOSIS — I10 Essential (primary) hypertension: Secondary | ICD-10-CM | POA: Diagnosis not present

## 2012-10-31 DIAGNOSIS — G8929 Other chronic pain: Secondary | ICD-10-CM | POA: Diagnosis not present

## 2012-11-09 ENCOUNTER — Telehealth: Payer: Self-pay | Admitting: Physical Medicine & Rehabilitation

## 2012-11-09 MED ORDER — GABAPENTIN 300 MG PO CAPS: 300.0000 mg | ORAL_CAPSULE | Freq: Three times a day (TID) | ORAL | Status: DC

## 2012-11-09 NOTE — Telephone Encounter (Signed)
Patient came to window requesting a refill on his medication for hydrocodone and gabapentin.  Please call patient

## 2012-11-09 NOTE — Telephone Encounter (Signed)
Gabapentin refilled.  Patient should have 1 refill of hydrocodone per last written script.  Left message informing patient of this.

## 2012-12-15 ENCOUNTER — Encounter
Payer: Medicare Other | Attending: Physical Medicine and Rehabilitation | Admitting: Physical Medicine and Rehabilitation

## 2012-12-15 ENCOUNTER — Encounter: Payer: Self-pay | Admitting: Physical Medicine and Rehabilitation

## 2012-12-15 ENCOUNTER — Telehealth: Payer: Self-pay

## 2012-12-15 VITALS — BP 144/85 | HR 78 | Resp 14 | Ht 68.0 in | Wt 186.0 lb

## 2012-12-15 DIAGNOSIS — G5790 Unspecified mononeuropathy of unspecified lower limb: Secondary | ICD-10-CM | POA: Diagnosis not present

## 2012-12-15 DIAGNOSIS — Z981 Arthrodesis status: Secondary | ICD-10-CM | POA: Diagnosis not present

## 2012-12-15 DIAGNOSIS — R109 Unspecified abdominal pain: Secondary | ICD-10-CM | POA: Insufficient documentation

## 2012-12-15 DIAGNOSIS — M961 Postlaminectomy syndrome, not elsewhere classified: Secondary | ICD-10-CM

## 2012-12-15 DIAGNOSIS — Z79899 Other long term (current) drug therapy: Secondary | ICD-10-CM | POA: Insufficient documentation

## 2012-12-15 DIAGNOSIS — F17200 Nicotine dependence, unspecified, uncomplicated: Secondary | ICD-10-CM | POA: Insufficient documentation

## 2012-12-15 DIAGNOSIS — G8929 Other chronic pain: Secondary | ICD-10-CM | POA: Insufficient documentation

## 2012-12-15 DIAGNOSIS — R1032 Left lower quadrant pain: Secondary | ICD-10-CM

## 2012-12-15 DIAGNOSIS — E785 Hyperlipidemia, unspecified: Secondary | ICD-10-CM | POA: Diagnosis not present

## 2012-12-15 DIAGNOSIS — I10 Essential (primary) hypertension: Secondary | ICD-10-CM | POA: Insufficient documentation

## 2012-12-15 DIAGNOSIS — G5792 Unspecified mononeuropathy of left lower limb: Secondary | ICD-10-CM

## 2012-12-15 DIAGNOSIS — M47817 Spondylosis without myelopathy or radiculopathy, lumbosacral region: Secondary | ICD-10-CM | POA: Diagnosis not present

## 2012-12-15 MED ORDER — HYDROCODONE-ACETAMINOPHEN 10-325 MG PO TABS: 1.0000 | ORAL_TABLET | Freq: Four times a day (QID) | ORAL | Status: DC | PRN

## 2012-12-15 MED ORDER — CYCLOBENZAPRINE HCL 10 MG PO TABS: 10.0000 mg | ORAL_TABLET | Freq: Two times a day (BID) | ORAL | Status: DC | PRN

## 2012-12-15 NOTE — Progress Notes (Signed)
Subjective:    Patient ID: Shaun Lang, male    DOB: 19-Sep-1955, 57 y.o.   MRN: [id number]  HPI Mr. Kienast is back regarding his left groin pain. His pain has been stable.Currently he's using some heat and ice as well as massage and he is doing some stretches, walking and yoga. He has also adjusted the position of his bed as well.  He maintains on the meloxicam and gabapentin for pain control. Sometimes he takes two gabapentin other days he takes 3 three. He uses meloxicam essentially PRN a few times a week.  Regarding his hydrocodone he takes about 10 per week. Typically he uses this most at night.   Pain Inventory Average Pain 8 Pain Right Now 8 My pain is intermittent, sharp, stabbing, tingling and aching  In the last 24 hours, has pain interfered with the following? General activity 7 Relation with others 9 Enjoyment of life 8 What TIME of day is your pain at its worst? evening and night Sleep (in general) Poor  Pain is worse with: sitting, inactivity and standing Pain improves with: rest and medication Relief from Meds: 8  Mobility walk without assistance how many minutes can you walk? 20 ability to climb steps?  yes do you drive?  no  Function disabled: date disabled 2008  Neuro/Psych tingling spasms  Prior Studies Any changes since last visit?  no  Physicians involved in your care Any changes since last visit?  no   Family History  Problem Relation Age of Onset  . Cancer Sister     breast   History   Social History  . Marital Status: Single    Spouse Name: N/A    Number of Children: N/A  . Years of Education: N/A   Social History Main Topics  . Smoking status: Current Every Day Smoker -- 1.00 packs/day    Types: Cigarettes  . Smokeless tobacco: None  . Alcohol Use: No  . Drug Use: No  . Sexual Activity: None   Other Topics Concern  . None   Social History Narrative  . None   Past Surgical History  Procedure Laterality Date  . Neck  surgery  07/21/02 and 01/2006  . Hernia repair  04/1998    [facility]  . Wrist surgery  1992    right   Past Medical History  Diagnosis Date  . Hyperlipidemia   . Hypertension   . DJD (degenerative joint disease)     disabilty secondary to neck issues  . Bell's palsy April 2010  . Chest pain with low risk for cardiac etiology     Low Risk Myoview Sept 2013  . Smoker    BP 144/85  Pulse 78  Resp 14  Ht 5\' 8"  (1.727 m)  Wt 186 lb (84.369 kg)  BMI 28.29 kg/m2  SpO2 98%     Review of Systems  Gastrointestinal: Positive for constipation.  Musculoskeletal:       Spasms  Neurological:       Tingling  All other systems reviewed and are negative.       Objective:   Physical Exam  General: Alert and oriented x 3, No apparent distress  HEENT: Head is normocephalic, atraumatic,    Extremities: No clubbing, cyanosis, or edema.  Skin: Clean and intact without signs of breakdown  Neuro: Pt is cognitively appropriate with normal insight, memory, and awareness.  Symmetric normal motor tone is noted throughout. Normal muscle bulk. Muscle testing reveals 5/5 muscle strength of the  upper extremity,except left triceps 4/5 and 5/5 of the lower extremity, except bilateral gastroc 4-4-/5. Full range of motion in upper and lower extremities. ROM of spine is  restricted. Fine motor movements are normal in both hands. Sensory is intact and symmetric to light touch, pinprick and proprioception. DTR in the upper and lower extremity are present and symmetric 2+, except left biceps trace to 1+. No clonus is noted.  Patient arises from chair without difficulty. Wide based gait with normal arm swing bilateral , able to walk on heels and toes, but only with flexed knees .  Good finger to nose and heel to shin testing. No tremor, dystaxia or dysmetria noted.       Assessment & Plan:  1. Chronic left inguinal pain- appears to be related to his prior inguinal hernia (indirect) and mesh repair. This pain  appears to be more related to scar tissue, muscle imbalance, posture, etc. His pain has shown improvement since our last visit and tends to be more of a problem when he lays over or puts pressure upon the area.  2. Hx of ACDF x2, last in 2007, by Dr. Noel Gerold 3. Spondylosis of L-spine,per patient, could not find images, consider x-ray in the future, and MRI if necessary.  Mild weakness in gastroc bilateral 4. Tobacco abuse  Plan:  1. The patient will continue with the principles taught to him by PT. He needs to be regular with, ice/heat stretching, massage, desensitization, strengthening, posture. He needs to be reasonable with posture, technique, etc as well which we reviewed today.  2. Continue PRN USE ONLY of meloxicam 15mg  with food  3. Continue neurontin at 300mg  bid to tid.  4. left ilioinguinal nerve block is still in consideration although benefits likely to be very transient given the likely etiology of the pain. At this point I don't believe it will be necessary.  5. Refilled hydrocodone and flexeril today. .  6. Follow up with me in 1 months. 25 min of face to face patient care time were spent during this visit, explained to patient in detail how to exercise to strengthen his LE muscles, and how to improve his balance. Recommended and showed him exercises he should do in the pool.. All questions were encouraged and answered.

## 2012-12-15 NOTE — Patient Instructions (Addendum)
Try to work out in the pool, try to [employer], and to walk on your toes with straight knees !

## 2012-12-15 NOTE — Telephone Encounter (Signed)
Patient called requesting a refill on his hydrocodone.  He has missed appointments due to school.  Appointment made today for him to see Shaun Lang to get refill.

## 2012-12-24 DIAGNOSIS — M545 Low back pain: Secondary | ICD-10-CM | POA: Diagnosis not present

## 2012-12-24 DIAGNOSIS — M62838 Other muscle spasm: Secondary | ICD-10-CM | POA: Diagnosis not present

## 2012-12-24 DIAGNOSIS — I10 Essential (primary) hypertension: Secondary | ICD-10-CM | POA: Diagnosis not present

## 2013-01-13 ENCOUNTER — Encounter
Payer: Medicare Other | Attending: Physical Medicine and Rehabilitation | Admitting: Physical Medicine and Rehabilitation

## 2013-01-13 ENCOUNTER — Encounter: Payer: Self-pay | Admitting: Physical Medicine and Rehabilitation

## 2013-01-13 VITALS — BP 152/77 | HR 88 | Resp 14 | Ht 68.0 in | Wt 186.0 lb

## 2013-01-13 DIAGNOSIS — M961 Postlaminectomy syndrome, not elsewhere classified: Secondary | ICD-10-CM

## 2013-01-13 DIAGNOSIS — Z5181 Encounter for therapeutic drug level monitoring: Secondary | ICD-10-CM

## 2013-01-13 DIAGNOSIS — R109 Unspecified abdominal pain: Secondary | ICD-10-CM | POA: Insufficient documentation

## 2013-01-13 DIAGNOSIS — G510 Bell's palsy: Secondary | ICD-10-CM | POA: Diagnosis not present

## 2013-01-13 DIAGNOSIS — G8929 Other chronic pain: Secondary | ICD-10-CM | POA: Insufficient documentation

## 2013-01-13 DIAGNOSIS — Z981 Arthrodesis status: Secondary | ICD-10-CM | POA: Diagnosis not present

## 2013-01-13 DIAGNOSIS — G5790 Unspecified mononeuropathy of unspecified lower limb: Secondary | ICD-10-CM

## 2013-01-13 DIAGNOSIS — M47817 Spondylosis without myelopathy or radiculopathy, lumbosacral region: Secondary | ICD-10-CM | POA: Diagnosis not present

## 2013-01-13 DIAGNOSIS — R1032 Left lower quadrant pain: Secondary | ICD-10-CM | POA: Diagnosis not present

## 2013-01-13 DIAGNOSIS — I10 Essential (primary) hypertension: Secondary | ICD-10-CM | POA: Insufficient documentation

## 2013-01-13 DIAGNOSIS — E785 Hyperlipidemia, unspecified: Secondary | ICD-10-CM | POA: Insufficient documentation

## 2013-01-13 DIAGNOSIS — F17200 Nicotine dependence, unspecified, uncomplicated: Secondary | ICD-10-CM | POA: Diagnosis not present

## 2013-01-13 DIAGNOSIS — G5792 Unspecified mononeuropathy of left lower limb: Secondary | ICD-10-CM

## 2013-01-13 DIAGNOSIS — M1990 Unspecified osteoarthritis, unspecified site: Secondary | ICD-10-CM | POA: Diagnosis not present

## 2013-01-13 DIAGNOSIS — Z79899 Other long term (current) drug therapy: Secondary | ICD-10-CM | POA: Insufficient documentation

## 2013-01-13 MED ORDER — GABAPENTIN 300 MG PO CAPS: ORAL_CAPSULE | ORAL | Status: DC

## 2013-01-13 MED ORDER — HYDROCODONE-ACETAMINOPHEN 10-325 MG PO TABS: 1.0000 | ORAL_TABLET | Freq: Four times a day (QID) | ORAL | Status: DC | PRN

## 2013-01-13 NOTE — Patient Instructions (Signed)
Continue with your exercise program 

## 2013-01-13 NOTE — Progress Notes (Signed)
Subjective:    Patient ID: Shaun Lang, male    DOB: 1956/02/05, 57 y.o.   MRN: [id number]  HPI Shaun Lang is back regarding his left groin pain. His burning nerve pain has increased.Currently he's using some heat and ice as well as massage and he is doing some stretches, walking and yoga. He has also adjusted the position of his bed as well.  He maintains on the meloxicam and gabapentin for pain control. Sometimes he takes two gabapentin other days he takes 3 three. He uses meloxicam essentially PRN a few times a week.     Pain Inventory Average Pain 9 Pain Right Now 8 My pain is sharp, burning, dull, stabbing, tingling and aching  In the last 24 hours, has pain interfered with the following? General activity 9 Relation with others 9 Enjoyment of life 10 What TIME of day is your pain at its worst? day, evening, night Sleep (in general) Poor  Pain is worse with: bending, sitting and standing Pain improves with: rest Relief from Meds: 6  Mobility walk without assistance how many minutes can you walk? 20 ability to climb steps?  yes do you drive?  no Do you have any goals in this area?  no  Function disabled: date disabled 2013  Neuro/Psych tingling spasms dizziness  Prior Studies Any changes since last visit?  no  Physicians involved in your care Any changes since last visit?  no   Family History  Problem Relation Age of Onset  . Cancer Sister     breast   History   Social History  . Marital Status: Single    Spouse Name: N/A    Number of Children: N/A  . Years of Education: N/A   Social History Main Topics  . Smoking status: Current Every Day Smoker -- 1.00 packs/day    Types: Cigarettes  . Smokeless tobacco: None  . Alcohol Use: No  . Drug Use: No  . Sexual Activity: None   Other Topics Concern  . None   Social History Narrative  . None   Past Surgical History  Procedure Laterality Date  . Neck surgery  07/21/02 and 01/2006  . Hernia  repair  04/1998    [facility]  . Wrist surgery  1992    right   Past Medical History  Diagnosis Date  . Hyperlipidemia   . Hypertension   . DJD (degenerative joint disease)     disabilty secondary to neck issues  . Bell's palsy April 2010  . Chest pain with low risk for cardiac etiology     Low Risk Myoview Sept 2013  . Smoker    BP 152/77  Pulse 88  Resp 14  Ht 5\' 8"  (1.727 m)  Wt 186 lb (84.369 kg)  BMI 28.29 kg/m2  SpO2 100%     Review of Systems  Constitutional: Positive for diaphoresis and appetite change.  Gastrointestinal: Positive for abdominal pain and constipation.  Musculoskeletal: Positive for back pain.  Neurological: Positive for dizziness.       Spasms, tingling  All other systems reviewed and are negative.       Objective:   Physical Exam General: Alert and oriented x 3, No apparent distress  HEENT: Head is normocephalic, atraumatic,  Extremities: No clubbing, cyanosis, or edema.  Skin: Clean and intact without signs of breakdown  Neuro: Pt is cognitively appropriate with normal insight, memory, and awareness.  Symmetric normal motor tone is noted throughout. Normal muscle bulk. Muscle testing reveals  5/5 muscle strength of the upper extremity,except left triceps 4/5 and 5/5 of the lower extremity, except bilateral gastroc 4-4-/5. Full range of motion in upper and lower extremities. ROM of spine is restricted. Fine motor movements are normal in both hands.  Sensory is intact and symmetric to light touch, pinprick and proprioception.  DTR in the upper and lower extremity are present and symmetric 2+, except left biceps trace to 1+. No clonus is noted.  Patient arises from chair without difficulty. Wide based gait with normal arm swing bilateral , able to walk on heels and toes, but only with flexed knees .  Good finger to nose and heel to shin testing. No tremor, dystaxia or dysmetria noted.         Assessment & Plan:  1. Chronic left inguinal pain-  appears to be related to his prior inguinal hernia (indirect) and mesh repair, in 2000. This pain appears to be more related to scar tissue, muscle imbalance, posture, etc. His pain has increased since our last visit and tends to be more of a problem when he lays over or puts pressure upon the area.  2. Hx of ACDF x2, last in 2007, by Dr. Noel Gerold  3. Spondylosis of L-spine,per patient, could not find images, consider x-ray in the future, and MRI if necessary. Mild weakness in gastroc bilateral  4. Tobacco abuse  Plan:  1. The patient will continue with the principles taught to him by PT. He needs to be regular with, ice/heat stretching, massage, desensitization, strengthening, posture. He needs to be reasonable with posture, technique, etc as well which we reviewed today.  2. Continue PRN USE ONLY of meloxicam 15mg  with food  3. Continue neurontin at 300mg , he was taking it tid, increased to 1 in the am, 1 at mid day, and 2 at bedtime, for his increased pain especially at night.   4. Wrote referral to general surgeon, Dr. Abbey Chatters, to evaluate his left inguinal pain, patient has Hx of hernia repair, with mesh, also to make sure that nothing is compressing the inguinal nerve on the left.  Left ilioinguinal nerve block is still in consideration although benefits likely to be very transient given the likely etiology of the pain. 5. Refilled hydrocodone and Gabapentin today.   6. Follow up with me in 1 months.   All questions were encouraged and answered.

## 2013-01-25 DIAGNOSIS — G5760 Lesion of plantar nerve, unspecified lower limb: Secondary | ICD-10-CM | POA: Diagnosis not present

## 2013-01-25 DIAGNOSIS — M722 Plantar fascial fibromatosis: Secondary | ICD-10-CM | POA: Diagnosis not present

## 2013-01-26 ENCOUNTER — Ambulatory Visit (INDEPENDENT_AMBULATORY_CARE_PROVIDER_SITE_OTHER): Payer: Medicaid Other | Admitting: General Surgery

## 2013-01-30 ENCOUNTER — Ambulatory Visit (INDEPENDENT_AMBULATORY_CARE_PROVIDER_SITE_OTHER): Payer: Medicare Other | Admitting: General Surgery

## 2013-01-30 ENCOUNTER — Encounter (INDEPENDENT_AMBULATORY_CARE_PROVIDER_SITE_OTHER): Payer: Self-pay

## 2013-01-30 ENCOUNTER — Encounter (INDEPENDENT_AMBULATORY_CARE_PROVIDER_SITE_OTHER): Payer: Self-pay | Admitting: General Surgery

## 2013-01-30 VITALS — BP 138/80 | HR 84 | Temp 98.0°F | Resp 16 | Ht 68.0 in | Wt 181.8 lb

## 2013-01-30 DIAGNOSIS — R1032 Left lower quadrant pain: Secondary | ICD-10-CM | POA: Diagnosis not present

## 2013-01-30 NOTE — Patient Instructions (Signed)
I recommend you have the injection.

## 2013-01-30 NOTE — Progress Notes (Signed)
Subjective:     Patient ID: Shaun Lang, male   DOB: [birth year], 58 y.o.   MRN: [id number]  HPI  He is here for follow up of his chronic neuropathic left groin pain following an left inguinal hernia repair with mesh 2000. He began having the pain in 2003 or 2004. He has been seen by Dr. Janee Morn  a number of times in this office. He tried medical therapy for this but still has some breakthrough pain. A recommendation has been made for him to have the nerve injected.  He denies any left groin bulges.   Review of Systems     Objective:   Physical Exam General-WDWN in NAD GU-left groin scars present. The left inguinal floor is solid. There is tenderness in this area. No bulging. Left inguinal floor demonstrates no bulging.    Assessment:     Chronic neuropathic pain following left inguinal repair with mesh. Still having breakthrough symptoms with medical treatment.     Plan:     I suggested he proceed with the nerve injection to see if he gets relief. This is been offered at the pain clinic before. I do not recommend surgery at this time because even removing the mesh and doing a trineurectomy may not definitely take care of his pain.

## 2013-02-03 DIAGNOSIS — I10 Essential (primary) hypertension: Secondary | ICD-10-CM | POA: Diagnosis not present

## 2013-02-03 DIAGNOSIS — G8929 Other chronic pain: Secondary | ICD-10-CM | POA: Diagnosis not present

## 2013-02-03 DIAGNOSIS — Z5181 Encounter for therapeutic drug level monitoring: Secondary | ICD-10-CM | POA: Diagnosis not present

## 2013-02-03 DIAGNOSIS — Z79899 Other long term (current) drug therapy: Secondary | ICD-10-CM | POA: Diagnosis not present

## 2013-02-09 ENCOUNTER — Encounter: Payer: Medicaid Other | Admitting: Physical Medicine and Rehabilitation

## 2013-02-13 ENCOUNTER — Telehealth: Payer: Self-pay

## 2013-02-13 DIAGNOSIS — G5792 Unspecified mononeuropathy of left lower limb: Secondary | ICD-10-CM

## 2013-02-13 DIAGNOSIS — R1032 Left lower quadrant pain: Secondary | ICD-10-CM

## 2013-02-13 MED ORDER — HYDROCODONE-ACETAMINOPHEN 10-325 MG PO TABS: 1.0000 | ORAL_TABLET | Freq: Four times a day (QID) | ORAL | Status: DC | PRN

## 2013-02-13 NOTE — Telephone Encounter (Signed)
rx printed for Clydie Braun to sign

## 2013-02-13 NOTE — Telephone Encounter (Signed)
rx available for pick up, must keep next appt

## 2013-02-13 NOTE — Telephone Encounter (Signed)
Patient is requesting medication refills(did not say which meds). He had appt last week he canceled, new appt is 12/5.

## 2013-02-24 ENCOUNTER — Encounter: Payer: Medicare Other | Attending: Physical Medicine and Rehabilitation | Admitting: Physical Medicine & Rehabilitation

## 2013-02-24 ENCOUNTER — Encounter: Payer: Self-pay | Admitting: Physical Medicine & Rehabilitation

## 2013-02-24 VITALS — BP 136/75 | HR 82 | Resp 14 | Ht 68.0 in | Wt 185.0 lb

## 2013-02-24 DIAGNOSIS — G5792 Unspecified mononeuropathy of left lower limb: Secondary | ICD-10-CM

## 2013-02-24 DIAGNOSIS — R109 Unspecified abdominal pain: Secondary | ICD-10-CM | POA: Diagnosis not present

## 2013-02-24 DIAGNOSIS — Z79899 Other long term (current) drug therapy: Secondary | ICD-10-CM | POA: Diagnosis not present

## 2013-02-24 DIAGNOSIS — I10 Essential (primary) hypertension: Secondary | ICD-10-CM | POA: Diagnosis not present

## 2013-02-24 DIAGNOSIS — Z981 Arthrodesis status: Secondary | ICD-10-CM | POA: Diagnosis not present

## 2013-02-24 DIAGNOSIS — R1032 Left lower quadrant pain: Secondary | ICD-10-CM | POA: Diagnosis not present

## 2013-02-24 DIAGNOSIS — E785 Hyperlipidemia, unspecified: Secondary | ICD-10-CM | POA: Insufficient documentation

## 2013-02-24 DIAGNOSIS — F17200 Nicotine dependence, unspecified, uncomplicated: Secondary | ICD-10-CM | POA: Insufficient documentation

## 2013-02-24 DIAGNOSIS — M47817 Spondylosis without myelopathy or radiculopathy, lumbosacral region: Secondary | ICD-10-CM | POA: Diagnosis not present

## 2013-02-24 DIAGNOSIS — G5790 Unspecified mononeuropathy of unspecified lower limb: Secondary | ICD-10-CM | POA: Diagnosis not present

## 2013-02-24 DIAGNOSIS — G8929 Other chronic pain: Secondary | ICD-10-CM | POA: Insufficient documentation

## 2013-02-24 NOTE — Patient Instructions (Signed)
CALL ME WITH ANY PROBLEMS OR QUESTIONS (#[phone]).    HAPPY HOLIDAYS!!!!!   

## 2013-02-24 NOTE — Progress Notes (Signed)
Subjective:    Patient ID: Shaun Lang, male    DOB: 01/26/1956, 57 y.o.   MRN: [id number]  HPI  Shaun Lang is back regarding his chronic left groin pain. His left groin has been generally stable. He still does some do some general stretching, yoga. He's not doing much in the way of massage.   He has backed off the gabapentin to 300mg  BID on most days. He will hold off the third dose if he's feeling well on a given day.   He takes hydrocodone once to twice daily for breakthrough pain. Typically he uses the day time dose after activity and the night time dose is more regular.     Pain Inventory Average Pain 8 Pain Right Now 8 My pain is burning, stabbing and aching  In the last 24 hours, has pain interfered with the following? General activity 7 Relation with others 10 Enjoyment of life 9 What TIME of day is your pain at its worst? day/night Sleep (in general) Poor  Pain is worse with: sitting and some activites Pain improves with: medication Relief from Meds: 6  Mobility how many minutes can you walk? 45 Do you have any goals in this area?  no  Function disabled: date disabled 10/22/2011  Neuro/Psych weakness numbness tingling spasms dizziness  Prior Studies Any changes since last visit?  no  Physicians involved in your care Any changes since last visit?  no   Family History  Problem Relation Age of Onset  . Cancer Sister     breast   History   Social History  . Marital Status: Single    Spouse Name: N/A    Number of Children: N/A  . Years of Education: N/A   Social History Main Topics  . Smoking status: Current Every Day Smoker -- 1.00 packs/day    Types: Cigarettes  . Smokeless tobacco: None  . Alcohol Use: No  . Drug Use: No  . Sexual Activity: None   Other Topics Concern  . None   Social History Narrative  . None   Past Surgical History  Procedure Laterality Date  . Neck surgery  07/21/02 and 01/2006  . Hernia repair  04/1998   [facility]  . Wrist surgery  1992    right   Past Medical History  Diagnosis Date  . Hyperlipidemia   . Hypertension   . DJD (degenerative joint disease)     disabilty secondary to neck issues  . Bell's palsy April 2010  . Chest pain with low risk for cardiac etiology     Low Risk Myoview Sept 2013  . Smoker    BP 136/75  Pulse 82  Resp 14  Ht 5\' 8"  (1.727 m)  Wt 185 lb (83.915 kg)  BMI 28.14 kg/m2  SpO2 96%     Review of Systems  Constitutional: Positive for appetite change.  Neurological: Positive for dizziness, weakness and numbness.  All other systems reviewed and are negative.       Objective:   Physical Exam  General: Alert and oriented x 3, No apparent distress  HEENT: Head is normocephalic, atraumatic, PERRLA, EOMI, sclera anicteric, oral mucosa pink and moist, dentition intact, ext ear canals clear,  Neck: Supple without JVD or lymphadenopathy  Heart: Reg rate and rhythm. No murmurs rubs or gallops  Chest: CTA bilaterally without wheezes, rales, or rhonchi; no distress  Abdomen: Soft, non-tender, non-distended, bowel sounds positive.  Extremities: No clubbing, cyanosis, or edema. Pulses are 2+  Skin: Clean and intact without signs of breakdown  Neuro: Pt is cognitively appropriate with normal insight, memory, and awareness. Cranial nerves 2-12 are intact. Sensory exam is normal. Reflexes are 2+ in all 4's. Fine motor coordination is intact. No tremors. Motor function is grossly 5/5.  Musculoskeletal: minimal tenderness at the left inguinal line with palpable mesh just medial to the ASIS and superior to the inguinal ligament. Posture is improved.  Psych: Pt's affect is appropriate. Pt is cooperative   Assessment & Plan:   1. Chronic left inguinal pain- appears to be related to his prior inguinal hernia (indirect) and mesh repair. This pain appears to be more related to scar tissue, muscle imbalance, posture, etc. Overall pain is much improved.  2. Tobacco abuse.    Plan:  1. Continue with regular ice/heat stretching, massage, desensitization, strengthening, posture. He needs to work on the massage more however. 2. Continue neurontin at 300mg  bid to tid.  3. Refilled hydrocodone and flexeril today. A CSA was signed. I would like for him to decrease the hydrocodone further if possible.  6. Follow up with me in 4 months. 15 minutes of face to face patient care time were spent during this visit. All questions were encouraged and answered.

## 2013-03-13 ENCOUNTER — Telehealth: Payer: Self-pay

## 2013-03-13 DIAGNOSIS — R1032 Left lower quadrant pain: Secondary | ICD-10-CM

## 2013-03-13 DIAGNOSIS — G5792 Unspecified mononeuropathy of left lower limb: Secondary | ICD-10-CM

## 2013-03-13 MED ORDER — HYDROCODONE-ACETAMINOPHEN 10-325 MG PO TABS: 1.0000 | ORAL_TABLET | Freq: Four times a day (QID) | ORAL | Status: DC | PRN

## 2013-03-13 NOTE — Telephone Encounter (Signed)
Patient is requesting a refill of Hydrocodone. RX is printed for Dr. Riley Kill to sign.

## 2013-04-17 ENCOUNTER — Telehealth: Payer: Self-pay | Admitting: *Deleted

## 2013-04-17 DIAGNOSIS — R1032 Left lower quadrant pain: Secondary | ICD-10-CM

## 2013-04-17 MED ORDER — HYDROCODONE-ACETAMINOPHEN 10-325 MG PO TABS: 1.0000 | ORAL_TABLET | Freq: Four times a day (QID) | ORAL | Status: DC | PRN

## 2013-04-17 NOTE — Telephone Encounter (Signed)
Refill on his hydrocodone.  Printed for Riley KillSwartz to sign.

## 2013-04-19 NOTE — Telephone Encounter (Signed)
Patient informed his hydrocodone rx is ready for pick up.

## 2013-05-17 ENCOUNTER — Telehealth: Payer: Self-pay

## 2013-05-17 DIAGNOSIS — R1032 Left lower quadrant pain: Secondary | ICD-10-CM

## 2013-05-17 MED ORDER — HYDROCODONE-ACETAMINOPHEN 10-325 MG PO TABS: 1.0000 | ORAL_TABLET | Freq: Four times a day (QID) | ORAL | Status: DC | PRN

## 2013-05-17 MED ORDER — CYCLOBENZAPRINE HCL 10 MG PO TABS: 10.0000 mg | ORAL_TABLET | Freq: Two times a day (BID) | ORAL | Status: DC | PRN

## 2013-05-17 NOTE — Telephone Encounter (Signed)
Patient called requesting a refill on Flexeril and Hydrocodone. Printed RX for Dr. Riley KillSwartz to sign. Patient will pick up around 2:30 pm.

## 2013-06-14 ENCOUNTER — Telehealth: Payer: Self-pay

## 2013-06-14 NOTE — Telephone Encounter (Signed)
Patient called requesting a medication refill.

## 2013-06-14 NOTE — Telephone Encounter (Signed)
Yes, please have him come in for an RN visit to review meds/ get RF etc

## 2013-06-14 NOTE — Telephone Encounter (Signed)
Patient is requesting a refill on Hydrocodone and Gabapentin. Patient was last seen 12/5. OV says F/U in 4 months, did you want patient to come in for a RN visit since he is taking Hydrocodone? Please advise.

## 2013-06-15 NOTE — Telephone Encounter (Signed)
Contacted patient to inform him per Dr. Riley KillSwartz he would need to come in for a RN visit. Patient scheduled a appt for 3/26 @ 1:20pm.

## 2013-06-16 ENCOUNTER — Encounter: Payer: Medicare Other | Attending: Physical Medicine & Rehabilitation | Admitting: *Deleted

## 2013-06-16 ENCOUNTER — Other Ambulatory Visit: Payer: Self-pay | Admitting: *Deleted

## 2013-06-16 ENCOUNTER — Encounter: Payer: Self-pay | Admitting: *Deleted

## 2013-06-16 VITALS — BP 152/74 | HR 85 | Resp 14

## 2013-06-16 DIAGNOSIS — F17200 Nicotine dependence, unspecified, uncomplicated: Secondary | ICD-10-CM | POA: Diagnosis not present

## 2013-06-16 DIAGNOSIS — R1032 Left lower quadrant pain: Secondary | ICD-10-CM

## 2013-06-16 DIAGNOSIS — I10 Essential (primary) hypertension: Secondary | ICD-10-CM | POA: Insufficient documentation

## 2013-06-16 DIAGNOSIS — M1990 Unspecified osteoarthritis, unspecified site: Secondary | ICD-10-CM

## 2013-06-16 DIAGNOSIS — G8929 Other chronic pain: Secondary | ICD-10-CM | POA: Insufficient documentation

## 2013-06-16 MED ORDER — HYDROCODONE-ACETAMINOPHEN 10-325 MG PO TABS: 1.0000 | ORAL_TABLET | Freq: Four times a day (QID) | ORAL | Status: DC | PRN

## 2013-06-16 MED ORDER — GABAPENTIN 300 MG PO CAPS: ORAL_CAPSULE | ORAL | Status: DC

## 2013-06-16 NOTE — Progress Notes (Addendum)
Here for pill count and medication refills. Did not bring bottle today.  Warned he must have at each visit to get new refill.  We will refill today but at next appt if no bottle he will have to go get for refill to be given.  VSS.     Pain level:8  He is having problems with his right shoulder going numb at night and having some spasms with use.  He was encouraged to use his flexeril for this and can discuss with Shaun Lang at next visit.  If this worsens he can call to see if there is a cancellation and can be seen earlier than his 07/07/13 appt.  He has had no falls in the last six months and is a low fall risk.  He does not have an Advanced Directive and I have given him the packet with instructions to call the number in the packet if he wishes to set up.  He will return to see Shaun Lang on 07/07/13.  Also, Shaun Lang's reason for not having his bottle is that he left it on the table where he was eating with a friend who has had neck surgery. I [facility] but I may have been mistaken. I cautioned him about leaving his bottle where other people can see that he is taking narcotics and that he should never throw his bottle away with his name because he risks others breaking in and taking them.  He acknowledges understanding.

## 2013-06-16 NOTE — Telephone Encounter (Signed)
Rx printed for MD to signed for RN visit 06/16/13

## 2013-06-23 ENCOUNTER — Ambulatory Visit: Payer: Medicare Other | Admitting: Physical Medicine & Rehabilitation

## 2013-07-07 ENCOUNTER — Encounter: Payer: Self-pay | Admitting: Physical Medicine & Rehabilitation

## 2013-07-07 ENCOUNTER — Encounter: Payer: Medicare Other | Attending: Physical Medicine & Rehabilitation | Admitting: Physical Medicine & Rehabilitation

## 2013-07-07 VITALS — BP 146/89 | HR 74 | Resp 14 | Ht 68.0 in | Wt 190.0 lb

## 2013-07-07 DIAGNOSIS — M67919 Unspecified disorder of synovium and tendon, unspecified shoulder: Secondary | ICD-10-CM | POA: Diagnosis not present

## 2013-07-07 DIAGNOSIS — M1990 Unspecified osteoarthritis, unspecified site: Secondary | ICD-10-CM | POA: Diagnosis not present

## 2013-07-07 DIAGNOSIS — Z79899 Other long term (current) drug therapy: Secondary | ICD-10-CM | POA: Diagnosis not present

## 2013-07-07 DIAGNOSIS — G5790 Unspecified mononeuropathy of unspecified lower limb: Secondary | ICD-10-CM

## 2013-07-07 DIAGNOSIS — M719 Bursopathy, unspecified: Secondary | ICD-10-CM | POA: Insufficient documentation

## 2013-07-07 DIAGNOSIS — M7581 Other shoulder lesions, right shoulder: Secondary | ICD-10-CM

## 2013-07-07 DIAGNOSIS — R109 Unspecified abdominal pain: Secondary | ICD-10-CM | POA: Insufficient documentation

## 2013-07-07 DIAGNOSIS — R1032 Left lower quadrant pain: Secondary | ICD-10-CM

## 2013-07-07 DIAGNOSIS — Z5181 Encounter for therapeutic drug level monitoring: Secondary | ICD-10-CM | POA: Diagnosis not present

## 2013-07-07 MED ORDER — HYDROCODONE-ACETAMINOPHEN 10-325 MG PO TABS
1.0000 | ORAL_TABLET | Freq: Two times a day (BID) | ORAL | Status: DC | PRN
Start: 2013-07-07 — End: 2013-08-11

## 2013-07-07 NOTE — Patient Instructions (Signed)

## 2013-07-07 NOTE — Progress Notes (Signed)
Subjective:    Patient ID: Shaun Lang, male    DOB: Apr 15, 1955, 58 y.o.   MRN: [id number]  HPI  [name] is back regarding his chronic groin pain. He complains of tingling pain in his right shoulder for the last two weeks associated tingling and pain in his neck.   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 There is no heavy work involved but he has to do a lot of manipulation.  His groin area has remained stable. He continues on the gabapentin and hydrocodone for pain control.   Pain Inventory Average Pain 8 Pain Right Now 7 My pain is sharp, stabbing, tingling and aching  In the last 24 hours, has pain interfered with the following? General activity 8 Relation with others 10 Enjoyment of life 8 What TIME of day is your pain at its worst? n/a Sleep (in general) Fair  Pain is worse with: bending, inactivity and standing Pain improves with: medication Relief from Meds: n/a  Mobility how many minutes can you walk? 60 ability to climb steps?  yes do you drive?  yes Do you have any goals in this area?  yes  Function employed # of hrs/week 24-32 retired  Neuro/Psych numbness tremor tingling spasms  Prior Studies Any changes since last visit?  no  Physicians involved in your care Any changes since last visit?  no   Family History  Problem Relation Age of Onset  . Cancer Sister     breast   History   Social History  . Marital Status: Single    Spouse Name: N/A    Number of Children: N/A  . Years of Education: N/A   Social History Main Topics  . Smoking status: Current Every Day Smoker -- 1.00 packs/day    Types: Cigarettes  . Smokeless tobacco: Never Used  . Alcohol Use: No  . Drug Use: No  . Sexual Activity: None   Other Topics Concern  . None   Social History Narrative  . None   Past Surgical History  Procedure Laterality Date  . Neck surgery  07/21/02 and 01/2006  . Hernia repair  04/1998    [facility]  .  Wrist surgery  1992    right   Past Medical History  Diagnosis Date  . Hyperlipidemia   . Hypertension   . DJD (degenerative joint disease)     disabilty secondary to neck issues  . Bell's palsy April 2010  . Chest pain with low risk for cardiac etiology     Low Risk Myoview Sept 2013  . Smoker    BP 146/89  Pulse 74  Resp 14  Ht 5\' 8"  (1.727 m)  Wt 190 lb (86.183 kg)  BMI 28.90 kg/m2  SpO2 99%  Opioid Risk Score:   Fall Risk Score: High Fall Risk (>13 points) (patient educated handout declined)   Review of Systems  Respiratory: Positive for shortness of breath and wheezing.   Gastrointestinal: Positive for constipation.  Musculoskeletal: Positive for back pain and neck pain.  Neurological: Positive for tremors and numbness.  All other systems reviewed and are negative.      Objective:   Physical Exam  General: Alert and oriented x 3, No apparent distress  HEENT: Head is normocephalic, atraumatic, PERRLA, EOMI, sclera anicteric, oral mucosa pink and moist, dentition intact, ext ear canals clear,  Neck: Supple without JVD or lymphadenopathy  Heart: Reg rate and rhythm. No murmurs rubs or  gallops  Chest: CTA bilaterally without wheezes, rales, or rhonchi; no distress  Abdomen: Soft, non-tender, non-distended, bowel sounds positive.  Extremities: No clubbing, cyanosis, or edema. Pulses are 2+  Skin: Clean and intact without signs of breakdown  Neuro: Pt is cognitively appropriate with normal insight, memory, and awareness. Cranial nerves 2-12 are intact. Sensory exam is normal. Reflexes are 2+ in all 4's. Fine motor coordination is intact. No tremors. Motor function is grossly 5/5 in all muscles tested.  Musculoskeletal: minimal tenderness at the left inguinal line with palpable mesh just medial to the ASIS and superior to the inguinal ligament. Right shoulder is negative for impingement or rotator cuff maneuvers. Tinel's test + at wrists, Phalen's negative.  Psych: Pt's  affect is appropriate. Pt is cooperative   Assessment & Plan:   1. Chronic left inguinal pain- appears to be related to his prior inguinal hernia (indirect) and mesh repair. This pain appears to be more related to scar tissue, muscle imbalance, posture, etc. Overall pain is much improved.  2. Mild right rotator cuff syndrome/bursitis 3. Mild CTS   Plan:  1. Rotator cuff exercises were provided. Also counseled pt on CTS and taking rest breaks.   2. Continue neurontin at 300mg  bid to tid.  3. Refilled hydrocodone and flexeril today.  4. Follow up with me in 4 months. 15 minutes of face to face patient care time were spent during this visit. All questions were encouraged and answered.

## 2013-07-17 ENCOUNTER — Other Ambulatory Visit: Payer: Self-pay | Admitting: Physical Medicine & Rehabilitation

## 2013-07-17 NOTE — Progress Notes (Signed)
Urine drug screen from 07/07/2013 was consistent. 

## 2013-08-11 ENCOUNTER — Telehealth: Payer: Self-pay

## 2013-08-11 DIAGNOSIS — R1032 Left lower quadrant pain: Secondary | ICD-10-CM

## 2013-08-11 DIAGNOSIS — Z79899 Other long term (current) drug therapy: Secondary | ICD-10-CM

## 2013-08-11 DIAGNOSIS — Z5181 Encounter for therapeutic drug level monitoring: Secondary | ICD-10-CM

## 2013-08-11 DIAGNOSIS — G5790 Unspecified mononeuropathy of unspecified lower limb: Secondary | ICD-10-CM

## 2013-08-11 DIAGNOSIS — M1990 Unspecified osteoarthritis, unspecified site: Secondary | ICD-10-CM

## 2013-08-11 DIAGNOSIS — M7581 Other shoulder lesions, right shoulder: Secondary | ICD-10-CM

## 2013-08-11 MED ORDER — HYDROCODONE-ACETAMINOPHEN 10-325 MG PO TABS: 1.0000 | ORAL_TABLET | Freq: Two times a day (BID) | ORAL | Status: DC | PRN

## 2013-08-11 NOTE — Telephone Encounter (Signed)
Patient request oxycontin refill.  Advised him we were closed 05/25 due to holiday.  He said that is fine.  Records show were are prescribing hydrocodone.  May need to call patient to clarify.

## 2013-08-11 NOTE — Telephone Encounter (Signed)
Printed RX for Hydrocodone for Shaun Lang to sign since Dr. Riley Kill had left for the day. Contacted patient to inform him that the refill is ready for pickup.

## 2013-09-12 ENCOUNTER — Telehealth: Payer: Self-pay | Admitting: Physical Medicine & Rehabilitation

## 2013-09-12 ENCOUNTER — Ambulatory Visit: Payer: Medicare Other | Admitting: Cardiovascular Disease

## 2013-09-12 DIAGNOSIS — M1990 Unspecified osteoarthritis, unspecified site: Secondary | ICD-10-CM

## 2013-09-12 DIAGNOSIS — M7581 Other shoulder lesions, right shoulder: Secondary | ICD-10-CM

## 2013-09-12 DIAGNOSIS — G5790 Unspecified mononeuropathy of unspecified lower limb: Secondary | ICD-10-CM

## 2013-09-12 DIAGNOSIS — Z5181 Encounter for therapeutic drug level monitoring: Secondary | ICD-10-CM

## 2013-09-12 DIAGNOSIS — Z79899 Other long term (current) drug therapy: Secondary | ICD-10-CM

## 2013-09-12 DIAGNOSIS — R1032 Left lower quadrant pain: Secondary | ICD-10-CM

## 2013-09-12 MED ORDER — HYDROCODONE-ACETAMINOPHEN 10-325 MG PO TABS: 1.0000 | ORAL_TABLET | Freq: Two times a day (BID) | ORAL | Status: DC | PRN

## 2013-09-12 NOTE — Telephone Encounter (Signed)
Hydrocodone rx printed.  Will contact patient when signed and ready for pick up. 

## 2013-09-12 NOTE — Telephone Encounter (Signed)
Pt came in today to say he needs a refill on his Hydrocod/acetamin he is out/ please call his cell number on file... Thanks so much... Ad

## 2013-09-12 NOTE — Telephone Encounter (Signed)
Patient informed hydrocodone rx is ready for pick up. 

## 2013-10-11 ENCOUNTER — Telehealth: Payer: Self-pay | Admitting: *Deleted

## 2013-10-11 DIAGNOSIS — G5790 Unspecified mononeuropathy of unspecified lower limb: Secondary | ICD-10-CM

## 2013-10-11 DIAGNOSIS — M7581 Other shoulder lesions, right shoulder: Secondary | ICD-10-CM

## 2013-10-11 DIAGNOSIS — M1990 Unspecified osteoarthritis, unspecified site: Secondary | ICD-10-CM

## 2013-10-11 DIAGNOSIS — R1032 Left lower quadrant pain: Secondary | ICD-10-CM

## 2013-10-11 DIAGNOSIS — Z5181 Encounter for therapeutic drug level monitoring: Secondary | ICD-10-CM

## 2013-10-11 DIAGNOSIS — Z79899 Other long term (current) drug therapy: Secondary | ICD-10-CM

## 2013-10-11 MED ORDER — HYDROCODONE-ACETAMINOPHEN 10-325 MG PO TABS
1.0000 | ORAL_TABLET | Freq: Two times a day (BID) | ORAL | Status: DC | PRN
Start: 2013-10-11 — End: 2013-10-25

## 2013-10-11 NOTE — Telephone Encounter (Signed)
Called for refill of hydrocodone.  Refill appropriate.  Has appt 11/06/13 with Dr Riley KillSwartz.

## 2013-10-18 ENCOUNTER — Ambulatory Visit (INDEPENDENT_AMBULATORY_CARE_PROVIDER_SITE_OTHER): Payer: Medicare Other | Admitting: Cardiovascular Disease

## 2013-10-18 ENCOUNTER — Encounter: Payer: Self-pay | Admitting: Cardiovascular Disease

## 2013-10-18 VITALS — BP 138/80 | HR 71 | Ht 68.0 in | Wt 187.5 lb

## 2013-10-18 DIAGNOSIS — E785 Hyperlipidemia, unspecified: Secondary | ICD-10-CM | POA: Diagnosis not present

## 2013-10-18 DIAGNOSIS — I10 Essential (primary) hypertension: Secondary | ICD-10-CM | POA: Diagnosis not present

## 2013-10-18 DIAGNOSIS — Z79899 Other long term (current) drug therapy: Secondary | ICD-10-CM | POA: Diagnosis not present

## 2013-10-18 DIAGNOSIS — F17200 Nicotine dependence, unspecified, uncomplicated: Secondary | ICD-10-CM | POA: Diagnosis not present

## 2013-10-18 NOTE — Patient Instructions (Signed)
PLEASE HAVE LABS -LIVER,LIPIDS  Your physician wants you to follow-up in 12 MONThS DR  [place] You will receive a reminder letter in the mail two months in advance. If you don't receive a letter, please call our office to schedule the follow-up appointment.

## 2013-10-18 NOTE — Progress Notes (Signed)
     10/18/2013 Shaun RichardsCurtis L Dromgoole   01/16/1956  [id number]  Primary Physician Jearld LeschWILLIAMS,DWIGHT M, MD Primary Cardiologist: Runell GessJonathan J. Berry MD Roseanne RenoFACP,FACC,FAHA, FSCAI   HPI:  The patient is a 58 year old thin appearing divorced African American male father of 4, grandfather of 5 grandchildren who I last saw 13 months ago. He was referred for chest pain. He had a negative Myoview. His pain has essentially resolved. Risk factors include continued tobacco abuse of 1 pack per day, hypertension, hyperlipidemia. I did start him on atorvastatin. He is resistant to risk factor modification. Since I saw him one year ago he really denies chest pain. 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   Current Outpatient Prescriptions  Medication Sig Dispense Refill  . amLODipine (NORVASC) 5 MG tablet Take 5 mg by mouth daily.      [name]. atorvastatin (LIPITOR) 20 MG tablet TAKE ONE TABLET BY MOUTH EVERY DAY  30 tablet  10  . cyclobenzaprine (FLEXERIL) 10 MG tablet Take 1 tablet (10 mg total) by mouth 2 (two) times daily as needed for muscle spasms.  60 tablet  3  . HYDROcodone-acetaminophen (NORCO) 10-325 MG per tablet Take 1 tablet by mouth every 12 (twelve) hours as needed.  45 tablet  0  . meloxicam (MOBIC) 15 MG tablet Take 15 mg by mouth daily.       No current facility-administered medications for this visit.    No Known Allergies  History   Social History  . Marital Status: Single    Spouse Name: N/A    Number of Children: N/A  . Years of Education: N/A   Occupational History  . Not on file.   Social History Main Topics  . Smoking status: Current Every Day Smoker -- 1.00 packs/day    Types: Cigarettes  . Smokeless tobacco: Never Used  . Alcohol Use: No  . Drug Use: No  . Sexual Activity: Not on file   Other Topics Concern  . Not on file   Social History Narrative  . No narrative on file     Review of Systems: General: negative for chills, fever, night sweats or  weight changes.  Cardiovascular: negative for chest pain, dyspnea on exertion, edema, orthopnea, palpitations, paroxysmal nocturnal dyspnea or shortness of breath Dermatological: negative for rash Respiratory: negative for cough or wheezing Urologic: negative for hematuria Abdominal: negative for nausea, vomiting, diarrhea, bright red blood per rectum, melena, or hematemesis Neurologic: negative for visual changes, syncope, or dizziness All other systems reviewed and are otherwise negative except as noted above.    Blood pressure 138/80, pulse 71, height 5\' 8"  (1.727 m), weight 187 lb 8 oz (85.049 kg).  General appearance: alert and no distress Neck: no adenopathy, no carotid bruit, no JVD, supple, symmetrical, trachea midline and thyroid not enlarged, symmetric, no tenderness/mass/nodules Lungs: clear to auscultation bilaterally Heart: regular rate and rhythm, S1, S2 normal, no murmur, click, rub or gallop Extremities: extremities normal, atraumatic, no cyanosis or edema  EKG normal sinus rhythm at 71 with nonspecific ST and T-wave changes and left axis deviation  ASSESSMENT AND PLAN:   Hypertension Controlled on current medications  Hyperlipidemia On statin therapy. We will recheck a fasting lipid and liver profile  Smoker Smoking one pack per day but he says he is "trying to stop"      Runell GessJonathan J. Berry MD [facility] HospitalFACP,FACC,FAHA, [facility] CenterFSCAI 10/18/2013 10:15 AM

## 2013-10-18 NOTE — Assessment & Plan Note (Signed)
Controlled on current medications 

## 2013-10-18 NOTE — Assessment & Plan Note (Signed)
On statin therapy. We will recheck a fasting lipid and liver profile

## 2013-10-18 NOTE — Assessment & Plan Note (Signed)
Smoking one pack per day but he says he is "trying to stop"

## 2013-10-25 ENCOUNTER — Encounter: Payer: Self-pay | Admitting: Physical Medicine & Rehabilitation

## 2013-10-25 ENCOUNTER — Encounter: Payer: Medicare Other | Attending: Physical Medicine & Rehabilitation | Admitting: Physical Medicine & Rehabilitation

## 2013-10-25 VITALS — BP 146/77 | HR 82 | Resp 14 | Wt 189.8 lb

## 2013-10-25 DIAGNOSIS — Z5181 Encounter for therapeutic drug level monitoring: Secondary | ICD-10-CM | POA: Diagnosis not present

## 2013-10-25 DIAGNOSIS — M719 Bursopathy, unspecified: Secondary | ICD-10-CM | POA: Diagnosis not present

## 2013-10-25 DIAGNOSIS — Z79899 Other long term (current) drug therapy: Secondary | ICD-10-CM

## 2013-10-25 DIAGNOSIS — R109 Unspecified abdominal pain: Secondary | ICD-10-CM | POA: Diagnosis not present

## 2013-10-25 DIAGNOSIS — G5790 Unspecified mononeuropathy of unspecified lower limb: Secondary | ICD-10-CM

## 2013-10-25 DIAGNOSIS — G8929 Other chronic pain: Secondary | ICD-10-CM | POA: Diagnosis not present

## 2013-10-25 DIAGNOSIS — M67919 Unspecified disorder of synovium and tendon, unspecified shoulder: Secondary | ICD-10-CM | POA: Diagnosis not present

## 2013-10-25 DIAGNOSIS — G5792 Unspecified mononeuropathy of left lower limb: Secondary | ICD-10-CM

## 2013-10-25 DIAGNOSIS — M1990 Unspecified osteoarthritis, unspecified site: Secondary | ICD-10-CM | POA: Diagnosis not present

## 2013-10-25 DIAGNOSIS — R1032 Left lower quadrant pain: Secondary | ICD-10-CM

## 2013-10-25 DIAGNOSIS — M7581 Other shoulder lesions, right shoulder: Secondary | ICD-10-CM

## 2013-10-25 MED ORDER — HYDROCODONE-ACETAMINOPHEN 10-325 MG PO TABS: 1.0000 | ORAL_TABLET | Freq: Two times a day (BID) | ORAL | Status: DC | PRN

## 2013-10-25 NOTE — Patient Instructions (Signed)
PLEASE CALL ME WITH ANY PROBLEMS OR QUESTIONS (#[phone]).      

## 2013-10-25 NOTE — Progress Notes (Signed)
Subjective:    Patient ID: Shaun Lang, male    DOB: 1955-06-15, 58 y.o.   MRN: [id number]  HPI  Shaun Lang is back regarding his chronic left inguinal/groin pain. He states things are about the same.   He worked last night at [employer] standing working on [employer] which exacerbates his symptoms. He typically works [employer] thru Thursday each week. He has been back working for the last several months---work picked up over the last few months. 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 He admittedly is working more than he had been in the spring.  He continues to take his medications as prescribed. No other meds have been added.   Pain Inventory Average Pain 9 Pain Right Now 8 My pain is sharp, stabbing, tingling and aching  In the last 24 hours, has pain interfered with the following? General activity 9 Relation with others 10 Enjoyment of life 9 What TIME of day is your pain at its worst? daytime and evening Sleep (in general) Fair  Pain is worse with: bending, sitting and standing Pain improves with: medication Relief from Meds: did not answer  Mobility how many minutes can you walk? 30 ability to climb steps?  no do you drive?  no  Function employed # of hrs/week 24 retired I need assistance with the following:  household duties  Neuro/Psych weakness tingling spasms  Prior Studies Any changes since last visit?  no  Physicians involved in your care Any changes since last visit?  no   Family History  Problem Relation Age of Onset  . Cancer Sister     breast   History   Social History  . Marital Status: Single    Spouse Name: N/A    Number of Children: N/A  . Years of Education: N/A   Social History Main Topics  . Smoking status: Current Every Day Smoker -- 1.00 packs/day    Types: Cigarettes  . Smokeless tobacco: Never Used  . Alcohol Use: No  . Drug Use: No  .  Sexual Activity: None   Other Topics Concern  . None   Social History Narrative  . None   Past Surgical History  Procedure Laterality Date  . Neck surgery  07/21/02 and 01/2006  . Hernia repair  04/1998    [facility]  . Wrist surgery  1992    right   Past Medical History  Diagnosis Date  . Hyperlipidemia   . Hypertension   . DJD (degenerative joint disease)     disabilty secondary to neck issues  . Bell's palsy April 2010  . Chest pain with low risk for cardiac etiology     Low Risk Myoview Sept 2013  . Smoker    BP 146/77  Pulse 82  Resp 14  Wt 189 lb 12.8 oz (86.093 kg)  SpO2 96%  Opioid Risk Score:   Fall Risk Score: Moderate Fall Risk (6-13 points) (previously educated and handout declined) Review of Systems  Constitutional: Positive for appetite change.  Respiratory: Positive for apnea and shortness of breath.   Gastrointestinal: Positive for constipation.  Endocrine:       High blood sugars  Musculoskeletal:       Spasms  Neurological: Positive for weakness.       Tingling  All other systems reviewed and are negative.      Objective:   Physical Exam  General: Alert and oriented x 3, No apparent distress  HEENT: Head is normocephalic, atraumatic, PERRLA, EOMI, sclera anicteric, oral mucosa pink and moist, dentition intact, ext ear canals clear,  Neck: Supple without JVD or lymphadenopathy  Heart: Reg rate and rhythm. No murmurs rubs or gallops  Chest: CTA bilaterally without wheezes, rales, or rhonchi; no distress  Abdomen: Soft, non-tender, non-distended, bowel sounds positive.  Extremities: No clubbing, cyanosis, or edema. Pulses are 2+  Skin: Clean and intact without signs of breakdown  Neuro: Pt is cognitively appropriate with normal insight, memory, and awareness. Cranial nerves 2-12 are intact. Sensory exam is normal. Reflexes are 2+ in all 4's. Fine motor coordination is intact. No tremors. Motor function is grossly 5/5 in all muscles tested.    Musculoskeletal: mild tenderness at the left inguinal line with palpable mesh just medial to the ASIS and superior to the inguinal ligament. Has pain with weight bearing. Minimal pain with lumbar ROM. Right shoulder is negative for impingement or rotator cuff maneuvers. FABER equivocal.  Psych: Pt's affect is appropriate. Pt is cooperative  Assessment & Plan:   1. Chronic left inguinal pain- appears to be related to his prior inguinal hernia (indirect) and mesh repair. This pain appears to be more related to scar tissue, muscle imbalance, posture, etc. Pain worsens with activity. I have concerns this could be hip joint as well 2. Mild right rotator cuff syndrome/bursitis  3. Mild CTS    Plan:  1. Provided him a note for work allowing him a rest break to sit for 10 minutes every two hours of standing 2. Continue neurontin at 300mg  bid to tid.  3. Refilled hydrocodone   today.  4. Ordered xrays of his left hip given his ongoing groin pain and pain with weight bearing 5. Follow up with me in 2 months. 15 minutes of face to face patient care time were spent during this visit. All questions were encouraged and answered.

## 2013-11-06 ENCOUNTER — Ambulatory Visit: Payer: Medicare Other | Admitting: Physical Medicine & Rehabilitation

## 2013-11-06 ENCOUNTER — Ambulatory Visit (HOSPITAL_COMMUNITY)
Admission: RE | Admit: 2013-11-06 | Discharge: 2013-11-06 | Disposition: A | Discharge status: Home / Self Care | Payer: Medicare Other | Source: Ambulatory Visit | Attending: Physical Medicine & Rehabilitation | Admitting: Physical Medicine & Rehabilitation

## 2013-11-06 DIAGNOSIS — F17200 Nicotine dependence, unspecified, uncomplicated: Secondary | ICD-10-CM | POA: Diagnosis not present

## 2013-11-06 DIAGNOSIS — M25559 Pain in unspecified hip: Secondary | ICD-10-CM | POA: Diagnosis not present

## 2013-11-06 DIAGNOSIS — R1032 Left lower quadrant pain: Secondary | ICD-10-CM

## 2013-11-06 DIAGNOSIS — I10 Essential (primary) hypertension: Secondary | ICD-10-CM | POA: Diagnosis not present

## 2013-11-06 DIAGNOSIS — M169 Osteoarthritis of hip, unspecified: Secondary | ICD-10-CM | POA: Diagnosis not present

## 2013-11-06 DIAGNOSIS — Z79899 Other long term (current) drug therapy: Secondary | ICD-10-CM | POA: Diagnosis not present

## 2013-11-06 DIAGNOSIS — M1990 Unspecified osteoarthritis, unspecified site: Secondary | ICD-10-CM

## 2013-11-06 DIAGNOSIS — M1610 Unilateral primary osteoarthritis, unspecified hip: Secondary | ICD-10-CM | POA: Diagnosis not present

## 2013-11-06 DIAGNOSIS — E785 Hyperlipidemia, unspecified: Secondary | ICD-10-CM | POA: Diagnosis not present

## 2013-11-06 LAB — HEPATIC FUNCTION PANEL
ALT: 18 U/L (ref 0–53)
AST: 18 U/L (ref 0–37)
Albumin: 4.3 g/dL (ref 3.5–5.2)
Alkaline Phosphatase: 49 U/L (ref 39–117)
BILIRUBIN TOTAL: 0.5 mg/dL (ref 0.2–1.2)
Bilirubin, Direct: 0.1 mg/dL (ref 0.0–0.3)
Indirect Bilirubin: 0.4 mg/dL (ref 0.2–1.2)
Total Protein: 6.8 g/dL (ref 6.0–8.3)

## 2013-11-06 LAB — LIPID PANEL
CHOL/HDL RATIO: 2.7 ratio
CHOLESTEROL: 169 mg/dL (ref 0–200)
HDL: 63 mg/dL (ref >39)
LDL Cholesterol: 89 mg/dL (ref 0–99)
Triglycerides: 85 mg/dL (ref <150)
VLDL: 17 mg/dL (ref 0–40)

## 2013-11-07 ENCOUNTER — Encounter: Payer: Self-pay | Admitting: *Deleted

## 2013-11-08 ENCOUNTER — Telehealth: Payer: Self-pay | Admitting: Physical Medicine & Rehabilitation

## 2013-11-08 NOTE — Telephone Encounter (Signed)
Contacted patient to inform him per Dr. Riley KillSwartz his X Ray showed minimal arthritis, and no change in treatment plan.

## 2013-11-08 NOTE — Telephone Encounter (Signed)
Please inform [name] that there is minimal arthritis on his left hip XR.  Thanks---no change in plan

## 2013-12-07 ENCOUNTER — Telehealth: Payer: Self-pay

## 2013-12-07 DIAGNOSIS — Z79899 Other long term (current) drug therapy: Secondary | ICD-10-CM

## 2013-12-07 DIAGNOSIS — M1990 Unspecified osteoarthritis, unspecified site: Secondary | ICD-10-CM

## 2013-12-07 DIAGNOSIS — G5790 Unspecified mononeuropathy of unspecified lower limb: Secondary | ICD-10-CM

## 2013-12-07 DIAGNOSIS — M7581 Other shoulder lesions, right shoulder: Secondary | ICD-10-CM

## 2013-12-07 DIAGNOSIS — Z5181 Encounter for therapeutic drug level monitoring: Secondary | ICD-10-CM

## 2013-12-07 DIAGNOSIS — R1032 Left lower quadrant pain: Secondary | ICD-10-CM

## 2013-12-07 MED ORDER — HYDROCODONE-ACETAMINOPHEN 10-325 MG PO TABS: 1.0000 | ORAL_TABLET | Freq: Two times a day (BID) | ORAL | Status: DC | PRN

## 2013-12-07 NOTE — Telephone Encounter (Signed)
Contacted patient to inform him the Hydrocodone RX is ready for pickup.

## 2013-12-07 NOTE — Telephone Encounter (Signed)
patient is requesting a refill on Hydrocodone. Last fill was 8/5. Next appt is 10/5. RX printed for [place] to sign. Will contacted patient when ready for pickup.

## 2013-12-18 DIAGNOSIS — G510 Bell's palsy: Secondary | ICD-10-CM | POA: Diagnosis not present

## 2013-12-18 DIAGNOSIS — H524 Presbyopia: Secondary | ICD-10-CM | POA: Diagnosis not present

## 2013-12-18 DIAGNOSIS — H2510 Age-related nuclear cataract, unspecified eye: Secondary | ICD-10-CM | POA: Diagnosis not present

## 2013-12-18 DIAGNOSIS — H04219 Epiphora due to excess lacrimation, unspecified lacrimal gland: Secondary | ICD-10-CM | POA: Diagnosis not present

## 2013-12-25 ENCOUNTER — Encounter: Payer: Medicare Other | Attending: Physical Medicine & Rehabilitation | Admitting: Physical Medicine & Rehabilitation

## 2014-01-08 ENCOUNTER — Telehealth: Payer: Self-pay | Admitting: *Deleted

## 2014-01-08 DIAGNOSIS — M1990 Unspecified osteoarthritis, unspecified site: Secondary | ICD-10-CM

## 2014-01-08 DIAGNOSIS — G894 Chronic pain syndrome: Secondary | ICD-10-CM

## 2014-01-08 DIAGNOSIS — M7581 Other shoulder lesions, right shoulder: Secondary | ICD-10-CM

## 2014-01-08 DIAGNOSIS — R1030 Lower abdominal pain, unspecified: Secondary | ICD-10-CM

## 2014-01-08 DIAGNOSIS — G5790 Unspecified mononeuropathy of unspecified lower limb: Secondary | ICD-10-CM

## 2014-01-08 DIAGNOSIS — Z5181 Encounter for therapeutic drug level monitoring: Secondary | ICD-10-CM

## 2014-01-08 DIAGNOSIS — M159 Polyosteoarthritis, unspecified: Secondary | ICD-10-CM

## 2014-01-08 DIAGNOSIS — M150 Primary generalized (osteo)arthritis: Secondary | ICD-10-CM

## 2014-01-08 DIAGNOSIS — R1032 Left lower quadrant pain: Secondary | ICD-10-CM

## 2014-01-08 MED ORDER — HYDROCODONE-ACETAMINOPHEN 10-325 MG PO TABS: 1.0000 | ORAL_TABLET | Freq: Two times a day (BID) | ORAL | Status: DC | PRN

## 2014-01-08 NOTE — Telephone Encounter (Signed)
Printed rx hydrocodone for Dr Riley KillSwartz to sign

## 2014-01-09 NOTE — Telephone Encounter (Signed)
Called patient and left message with patients son to call us back in the morning on Wednesday 01/10/14

## 2014-01-11 NOTE — Telephone Encounter (Signed)
Duplicate - already took care of this

## 2014-02-12 ENCOUNTER — Telehealth: Payer: Self-pay | Admitting: *Deleted

## 2014-02-12 NOTE — Telephone Encounter (Signed)
Needs refill on his medication, hydrocodone-acetaminophen, need to call and make an appt?

## 2014-02-12 NOTE — Telephone Encounter (Signed)
Mr Shaun Lang has an appt 03/02/14 with Dr Riley KillSwartz.  He is needing a refill on his hydrocodone and [name] says to schedule appt.  Can this be moved up like asap?

## 2014-02-12 NOTE — Telephone Encounter (Signed)
Shaun LinkKen Please tell the patient to make an appointment. Thank you

## 2014-02-12 NOTE — Telephone Encounter (Signed)
Dr. Riley KillSwartz does not have anything earlier

## 2014-02-13 ENCOUNTER — Encounter: Payer: Self-pay | Admitting: *Deleted

## 2014-02-13 ENCOUNTER — Other Ambulatory Visit: Payer: Self-pay | Admitting: Physical Medicine & Rehabilitation

## 2014-02-13 ENCOUNTER — Telehealth: Payer: Self-pay | Admitting: *Deleted

## 2014-02-13 ENCOUNTER — Encounter: Payer: Medicare Other | Attending: Physical Medicine & Rehabilitation | Admitting: *Deleted

## 2014-02-13 VITALS — BP 152/98 | HR 74 | Resp 14 | Wt 189.0 lb

## 2014-02-13 DIAGNOSIS — M7581 Other shoulder lesions, right shoulder: Secondary | ICD-10-CM

## 2014-02-13 DIAGNOSIS — M150 Primary generalized (osteo)arthritis: Secondary | ICD-10-CM

## 2014-02-13 DIAGNOSIS — R1032 Left lower quadrant pain: Secondary | ICD-10-CM

## 2014-02-13 DIAGNOSIS — G5790 Unspecified mononeuropathy of unspecified lower limb: Secondary | ICD-10-CM

## 2014-02-13 DIAGNOSIS — R1030 Lower abdominal pain, unspecified: Secondary | ICD-10-CM | POA: Diagnosis not present

## 2014-02-13 DIAGNOSIS — G894 Chronic pain syndrome: Secondary | ICD-10-CM | POA: Diagnosis not present

## 2014-02-13 DIAGNOSIS — Z79899 Other long term (current) drug therapy: Secondary | ICD-10-CM

## 2014-02-13 DIAGNOSIS — Z5181 Encounter for therapeutic drug level monitoring: Secondary | ICD-10-CM | POA: Diagnosis present

## 2014-02-13 DIAGNOSIS — M159 Polyosteoarthritis, unspecified: Secondary | ICD-10-CM

## 2014-02-13 MED ORDER — HYDROCODONE-ACETAMINOPHEN 10-325 MG PO TABS: 1.0000 | ORAL_TABLET | Freq: Two times a day (BID) | ORAL | Status: DC | PRN

## 2014-02-13 NOTE — Progress Notes (Signed)
Mr Shaun Lang in for pill count and med refill.  He is out of his pills and did not bring bottle today.  I have warned him that he must have the bottle for next refill in order to get his next rx. We collected a random UDS today.  His BP is 152/98 and he has not taken his bp medication.  I have encouraged him to take it as soon as he gets home.  He says that sometimes he gets a little fluttering in his chest and has had some dizziness and some pain.  i have encouraged him STRONGLY to go see his PCP to have this checked and have reviewed signs and symptoms of heart attack as well as stroke.  He agrees to follow up.  He will return in December 03/02/14 with a previously scheduled appt with Dr Riley KillSwartz.

## 2014-02-14 LAB — PMP ALCOHOL METABOLITE (ETG): ETGU: NEGATIVE ng/mL

## 2014-02-17 LAB — OPIATES/OPIOIDS (LC/MS-MS)
Codeine Urine: NEGATIVE ng/mL (ref <50)
HYDROCODONE: 110 ng/mL (ref <50)
Hydromorphone: 167 ng/mL (ref <50)
Morphine Urine: NEGATIVE ng/mL (ref <50)
Norhydrocodone, Ur: 302 ng/mL (ref <50)
Noroxycodone, Ur: NEGATIVE ng/mL (ref <50)
OXYCODONE, UR: NEGATIVE ng/mL (ref <50)
Oxymorphone: NEGATIVE ng/mL (ref <50)

## 2014-02-20 LAB — PRESCRIPTION MONITORING PROFILE (SOLSTAS)
Amphetamine/Meth: NEGATIVE ng/mL
Barbiturate Screen, Urine: NEGATIVE ng/mL
Benzodiazepine Screen, Urine: NEGATIVE ng/mL
Buprenorphine, Urine: NEGATIVE ng/mL
CANNABINOID SCRN UR: NEGATIVE ng/mL
CARISOPRODOL, URINE: NEGATIVE ng/mL
COCAINE METABOLITES: NEGATIVE ng/mL
CREATININE, URINE: 149.02 mg/dL (ref >20.0)
Fentanyl, Ur: NEGATIVE ng/mL
MDMA URINE: NEGATIVE ng/mL
MEPERIDINE UR: NEGATIVE ng/mL
METHADONE SCREEN, URINE: NEGATIVE ng/mL
Nitrites, Initial: NEGATIVE ug/mL
OXYCODONE SCRN UR: NEGATIVE ng/mL
Propoxyphene: NEGATIVE ng/mL
Tapentadol, urine: NEGATIVE ng/mL
Tramadol Scrn, Ur: NEGATIVE ng/mL
Zolpidem, Urine: NEGATIVE ng/mL
pH, Initial: 6 pH (ref 4.5–8.9)

## 2014-02-28 NOTE — Progress Notes (Signed)
Urine drug screen for this encounter is consistent for prescribed medication 

## 2014-03-02 ENCOUNTER — Encounter: Payer: Self-pay | Admitting: Physical Medicine & Rehabilitation

## 2014-03-02 ENCOUNTER — Encounter: Payer: Medicare Other | Attending: Physical Medicine & Rehabilitation | Admitting: Physical Medicine & Rehabilitation

## 2014-03-02 VITALS — BP 137/76 | HR 64 | Resp 14 | Ht 68.0 in | Wt 186.0 lb

## 2014-03-02 DIAGNOSIS — G5790 Unspecified mononeuropathy of unspecified lower limb: Secondary | ICD-10-CM | POA: Diagnosis present

## 2014-03-02 DIAGNOSIS — G894 Chronic pain syndrome: Secondary | ICD-10-CM | POA: Insufficient documentation

## 2014-03-02 DIAGNOSIS — Z5181 Encounter for therapeutic drug level monitoring: Secondary | ICD-10-CM | POA: Insufficient documentation

## 2014-03-02 DIAGNOSIS — M150 Primary generalized (osteo)arthritis: Secondary | ICD-10-CM | POA: Insufficient documentation

## 2014-03-02 DIAGNOSIS — M7581 Other shoulder lesions, right shoulder: Secondary | ICD-10-CM | POA: Diagnosis present

## 2014-03-02 DIAGNOSIS — R1032 Left lower quadrant pain: Secondary | ICD-10-CM

## 2014-03-02 DIAGNOSIS — M159 Polyosteoarthritis, unspecified: Secondary | ICD-10-CM

## 2014-03-02 DIAGNOSIS — R1030 Lower abdominal pain, unspecified: Secondary | ICD-10-CM | POA: Diagnosis present

## 2014-03-02 MED ORDER — HYDROCODONE-ACETAMINOPHEN 10-325 MG PO TABS: 1.0000 | ORAL_TABLET | Freq: Two times a day (BID) | ORAL | Status: DC | PRN

## 2014-03-02 NOTE — Progress Notes (Signed)
Subjective:    Patient ID: Shaun Lang, male    DOB: 10/10/1955, 58 y.o.   MRN: [id number]  HPI   Shaun Lang is back regarding his chronic inguinal pain. He has continued to work at [employer] and the more he works the more she generally has pain. He uses his hydrocodone for breakthrough pain.   He does complain of right shoulder pain which has been increasing over the last couple months. He hasn't been doing much to treat the pain other than rest and hydrocodone. He is not using the meloxicam regularly.    Pain Inventory Average Pain 9 Pain Right Now 8 My pain is sharp, burning, tingling and aching  In the last 24 hours, has pain interfered with the following? General activity 8 Relation with others 10 Enjoyment of life 9 What TIME of day is your pain at its worst? evening, night Sleep (in general) Poor  Pain is worse with: walking, bending and some activites Pain improves with: rest and medication Relief from Meds: 7  Mobility walk without assistance how many minutes can you walk? 15 ability to climb steps?  yes do you drive?  yes Do you have any goals in this area?  yes  Function employed # of hrs/week 28 disabled: date disabled . retired  Neuro/Psych tremor spasms dizziness  Prior Studies Any changes since last visit?  no  Physicians involved in your care Any changes since last visit?  no   Family History  Problem Relation Age of Onset  . Cancer Sister     breast   History   Social History  . Marital Status: Single    Spouse Name: N/A    Number of Children: N/A  . Years of Education: N/A   Social History Main Topics  . Smoking status: Current Every Day Smoker -- 1.00 packs/day    Types: Cigarettes  . Smokeless tobacco: Never Used  . Alcohol Use: No  . Drug Use: No  . Sexual Activity: None   Other Topics Concern  . None   Social History Narrative   Past Surgical History  Procedure Laterality Date  . Neck surgery  07/21/02 and 01/2006  .  Hernia repair  04/1998    [facility]  . Wrist surgery  1992    right   Past Medical History  Diagnosis Date  . Hyperlipidemia   . Hypertension   . DJD (degenerative joint disease)     disabilty secondary to neck issues  . Bell's palsy April 2010  . Chest pain with low risk for cardiac etiology     Low Risk Myoview Sept 2013  . Smoker    BP 137/76 mmHg  Pulse 64  Resp 14  Ht 5\' 8"  (1.727 m)  Wt 186 lb (84.369 kg)  BMI 28.29 kg/m2  SpO2 100%  Opioid Risk Score:   Fall Risk Score: Moderate Fall Risk (6-13 points) (pt given fall prevention pamphlet during todays visit)  Review of Systems  Neurological: Positive for dizziness and tremors.       Spasms  All other systems reviewed and are negative.      Objective:   Physical Exam General: Alert and oriented x 3, No apparent distress  HEENT: Head is normocephalic, atraumatic, PERRLA, EOMI, sclera anicteric, oral mucosa pink and moist, dentition intact, ext ear canals clear,  Neck: Supple without JVD or lymphadenopathy  Heart: Reg rate and rhythm. No murmurs rubs or gallops  Chest: CTA bilaterally without wheezes, rales, or rhonchi;  no distress  Abdomen: Soft, non-tender, non-distended, bowel sounds positive.  Extremities: No clubbing, cyanosis, or edema. Pulses are 2+  Skin: Clean and intact without signs of breakdown  Neuro: Pt is cognitively appropriate with normal insight, memory, and awareness. Cranial nerves 2-12 are intact. Sensory exam is normal. Reflexes are 2+ in all 4's. Fine motor coordination is intact. No tremors. Motor function is grossly 5/5 in all muscles tested.  Musculoskeletal: mild tenderness at the left inguinal line with palpable mesh just medial to the ASIS and superior to the inguinal ligament. Has pain with weight bearing. Minimal pain with lumbar ROM. Right shoulder is negative for impingement or rotator cuff maneuvers. FABER equivocal.  Psych: Pt's affect is appropriate. Pt is cooperative    Assessment &  Plan:   1. Chronic left inguinal pain- appears to be related to his prior inguinal hernia (indirect) and mesh repair. This pain appears to be more related to scar tissue, muscle imbalance, posture, etc. Pain worsens with activity. I have concerns this could be hip joint as well  2. Mild right rotator cuff syndrome/bursitis  3. Mild CTS    Plan:  1. Needs to continue to take breaks to help with his work tolerance. He has to be realistic with activity expectations! 2. Continue neurontin at 300mg  bid to tid.  3. Refilled hydrocodone today.  4. Recommended mobic, ice , and heat for right shoulder---symptoms are pretty mild overall. 5. Follow up with me in 3 months. 15 minutes of face to face patient care time were spent during this visit. All questions were encouraged and answered.

## 2014-03-02 NOTE — Patient Instructions (Signed)
TRY ICE, HEAT, GENTLE STRETCHING FOR RIGHT SHOULDER. IF YOUR RIGHT SHOULDER HURTS AT WORK, YOU NEED TO GIVE IT A REST---DON'T KEEP PUSHING THROUGH IT!

## 2014-03-20 NOTE — Telephone Encounter (Signed)
error 

## 2014-04-18 ENCOUNTER — Telehealth: Payer: Self-pay | Admitting: *Deleted

## 2014-04-18 DIAGNOSIS — G5790 Unspecified mononeuropathy of unspecified lower limb: Secondary | ICD-10-CM

## 2014-04-18 DIAGNOSIS — R1032 Left lower quadrant pain: Secondary | ICD-10-CM

## 2014-04-18 DIAGNOSIS — M150 Primary generalized (osteo)arthritis: Secondary | ICD-10-CM

## 2014-04-18 DIAGNOSIS — M159 Polyosteoarthritis, unspecified: Secondary | ICD-10-CM

## 2014-04-18 DIAGNOSIS — M7581 Other shoulder lesions, right shoulder: Secondary | ICD-10-CM

## 2014-04-18 DIAGNOSIS — Z5181 Encounter for therapeutic drug level monitoring: Secondary | ICD-10-CM

## 2014-04-18 MED ORDER — HYDROCODONE-ACETAMINOPHEN 10-325 MG PO TABS: 1.0000 | ORAL_TABLET | Freq: Two times a day (BID) | ORAL | Status: DC | PRN

## 2014-04-18 NOTE — Telephone Encounter (Signed)
I believe he meant to say hydrocodone (he has not been prescribed oxycontin) his next appt is 05/31/13 so he will need two rx for hydrocodone to get him to the appt..  I notified him that he can pick up his rx's and MUST keep his appt 06/01/14 @10 :30 am. Will do UDS at next visit since he is not seen monthly.

## 2014-04-18 NOTE — Telephone Encounter (Signed)
Pt asking for refill on his oxycontin

## 2014-05-01 DIAGNOSIS — M62838 Other muscle spasm: Secondary | ICD-10-CM | POA: Diagnosis not present

## 2014-05-01 DIAGNOSIS — I10 Essential (primary) hypertension: Secondary | ICD-10-CM | POA: Diagnosis not present

## 2014-05-01 DIAGNOSIS — R5382 Chronic fatigue, unspecified: Secondary | ICD-10-CM | POA: Diagnosis not present

## 2014-05-01 DIAGNOSIS — E785 Hyperlipidemia, unspecified: Secondary | ICD-10-CM | POA: Diagnosis not present

## 2014-05-02 DIAGNOSIS — M62838 Other muscle spasm: Secondary | ICD-10-CM | POA: Diagnosis not present

## 2014-05-02 DIAGNOSIS — I10 Essential (primary) hypertension: Secondary | ICD-10-CM | POA: Diagnosis not present

## 2014-05-02 DIAGNOSIS — E785 Hyperlipidemia, unspecified: Secondary | ICD-10-CM | POA: Diagnosis not present

## 2014-05-02 DIAGNOSIS — F5221 Male erectile disorder: Secondary | ICD-10-CM | POA: Diagnosis not present

## 2014-06-01 ENCOUNTER — Ambulatory Visit: Payer: Medicare Other | Admitting: Physical Medicine & Rehabilitation

## 2014-06-05 ENCOUNTER — Other Ambulatory Visit: Payer: Self-pay | Admitting: Physical Medicine & Rehabilitation

## 2014-06-05 ENCOUNTER — Encounter: Payer: Self-pay | Admitting: Physical Medicine & Rehabilitation

## 2014-06-05 ENCOUNTER — Encounter: Payer: Medicare Other | Attending: Physical Medicine & Rehabilitation | Admitting: Physical Medicine & Rehabilitation

## 2014-06-05 VITALS — BP 133/83 | HR 63 | Resp 14

## 2014-06-05 DIAGNOSIS — G5792 Unspecified mononeuropathy of left lower limb: Secondary | ICD-10-CM

## 2014-06-05 DIAGNOSIS — R1030 Lower abdominal pain, unspecified: Secondary | ICD-10-CM | POA: Diagnosis not present

## 2014-06-05 DIAGNOSIS — M25562 Pain in left knee: Secondary | ICD-10-CM | POA: Diagnosis not present

## 2014-06-05 DIAGNOSIS — M25561 Pain in right knee: Secondary | ICD-10-CM | POA: Diagnosis not present

## 2014-06-05 DIAGNOSIS — M159 Polyosteoarthritis, unspecified: Secondary | ICD-10-CM

## 2014-06-05 DIAGNOSIS — G5790 Unspecified mononeuropathy of unspecified lower limb: Secondary | ICD-10-CM | POA: Diagnosis not present

## 2014-06-05 DIAGNOSIS — G5791 Unspecified mononeuropathy of right lower limb: Secondary | ICD-10-CM | POA: Diagnosis not present

## 2014-06-05 DIAGNOSIS — Z79899 Other long term (current) drug therapy: Secondary | ICD-10-CM

## 2014-06-05 DIAGNOSIS — R1032 Left lower quadrant pain: Secondary | ICD-10-CM

## 2014-06-05 DIAGNOSIS — M150 Primary generalized (osteo)arthritis: Secondary | ICD-10-CM | POA: Insufficient documentation

## 2014-06-05 DIAGNOSIS — M7581 Other shoulder lesions, right shoulder: Secondary | ICD-10-CM | POA: Diagnosis present

## 2014-06-05 DIAGNOSIS — Z5181 Encounter for therapeutic drug level monitoring: Secondary | ICD-10-CM

## 2014-06-05 DIAGNOSIS — G894 Chronic pain syndrome: Secondary | ICD-10-CM | POA: Insufficient documentation

## 2014-06-05 MED ORDER — HYDROCODONE-ACETAMINOPHEN 10-325 MG PO TABS: 1.0000 | ORAL_TABLET | Freq: Two times a day (BID) | ORAL | Status: DC | PRN

## 2014-06-05 NOTE — Progress Notes (Signed)
Subjective:    Patient ID: Shaun RichardsCurtis L Lang, male    DOB: [birth year], 59 y.o.   MRN: [id number]  HPI   Shaun Lang is back regarding his chronic inguinal pain.  He states his pain is about the same. He is looking into some work at [employer] with the [employer].   His inguinal region has remained steady. He is able to find positions where he is more comfortable. His pain medication remains effective.   He developed some knee pain after walking 4 miles the other day.    Pain Inventory Average Pain 9 Pain Right Now 7 My pain is sharp, burning, tingling and aching  In the last 24 hours, has pain interfered with the following? General activity 8 Relation with others 5 Enjoyment of life 7 What TIME of day is your pain at its worst? morning, evening  Sleep (in general) Poor  Pain is worse with: bending and some activites Pain improves with: medication Relief from Meds: 8  Mobility walk without assistance how many minutes can you walk? 30 ability to climb steps?  yes do you drive?  yes Do you have any goals in this area?  yes  Function retired  Neuro/Psych spasms  Prior Studies Any changes since last visit?  no  Physicians involved in your care Any changes since last visit?  no   Family History  Problem Relation Age of Onset  . Cancer Sister     breast   History   Social History  . Marital Status: Single    Spouse Name: N/A  . Number of Children: N/A  . Years of Education: N/A   Social History Main Topics  . Smoking status: Current Every Day Smoker -- 1.00 packs/day    Types: Cigarettes  . Smokeless tobacco: Never Used  . Alcohol Use: No  . Drug Use: No  . Sexual Activity: Not on file   Other Topics Concern  . None   Social History Narrative   Past Surgical History  Procedure Laterality Date  . Neck surgery  07/21/02 and 01/2006  . Hernia repair  04/1998    [facility]  . Wrist surgery  1992    right   Past Medical History  Diagnosis Date  .  Hyperlipidemia   . Hypertension   . DJD (degenerative joint disease)     disabilty secondary to neck issues  . Bell's palsy April 2010  . Chest pain with low risk for cardiac etiology     Low Risk Myoview Sept 2013  . Smoker    There were no vitals taken for this visit.  Opioid Risk Score:   Fall Risk Score: Low Fall Risk (0-5 points)  Review of Systems  Gastrointestinal: Positive for constipation.  Neurological:       Spasms   All other systems reviewed and are negative.      Objective:   Physical Exam   General: Alert and oriented x 3, No apparent distress  HEENT: Head is normocephalic, atraumatic, PERRLA, EOMI, sclera anicteric, oral mucosa pink and moist, dentition intact, ext ear canals clear,  Neck: Supple without JVD or lymphadenopathy  Heart: Reg rate and rhythm. No murmurs rubs or gallops  Chest: CTA bilaterally without wheezes, rales, or rhonchi; no distress  Abdomen: Soft, non-tender, non-distended, bowel sounds positive.  Extremities: No clubbing, cyanosis, or edema. Pulses are 2+  Skin: Clean and intact without signs of breakdown  Neuro: Pt is cognitively appropriate with normal insight, memory, and awareness. Cranial  nerves 2-12 are intact. Sensory exam is normal. Reflexes are 2+ in all 4's. Fine motor coordination is intact. No tremors. Motor function is grossly 5/5 in all muscles tested.  Musculoskeletal: mild tenderness at the left inguinal line with palpable mesh just medial to the ASIS and superior to the inguinal ligament. Both knees with mild crepitus and minimal pain.. Minimal pain with lumbar ROM. Right shoulder is negative for impingement or rotator cuff maneuvers. FABER equivocal.  Psych: Pt's affect is appropriate. Pt is cooperative    Assessment & Plan:   1. Chronic left inguinal pain- appears to be related to his prior inguinal hernia (indirect) and mesh repair. This pain appears to be more related to scar tissue, muscle imbalance, posture, etc.  Pain worsens with activity. I have concerns this could be hip joint as well  2. Mild right rotator cuff syndrome/bursitis  3. Mild CTS   Plan:  1. Needs to continue to take breaks to help with his work tolerance. Proper technique was emphasized. 2. Continue neurontin at  bid to tid.  3. Refilled hydrocodone today with second refill for next month. 4. Knee pain is likely due to the sudden increase in his physical activity. Needs to be "step-wise" in his exercise approach 5. Follow up with me in 3 months. 15 minutes of face to face patient care time were spent during this visit. All questions were encouraged and answered.

## 2014-06-05 NOTE — Patient Instructions (Signed)
PRACTICE GOOD POSTURE AND TECHNIQUE  TAKE YOUR TIME AND REMEMBER TO STRETCH

## 2014-06-05 NOTE — Addendum Note (Signed)
Addended by: Angela NevinWESSLING, Gennavieve Huq D on: 06/05/2014 12:06 PM   Modules accepted: Orders

## 2014-06-06 LAB — PMP ALCOHOL METABOLITE (ETG): Ethyl Glucuronide (EtG): NEGATIVE ng/mL

## 2014-06-09 LAB — OPIATES/OPIOIDS (LC/MS-MS)
CODEINE URINE: NEGATIVE ng/mL (ref <50)
HYDROCODONE: 71 ng/mL (ref <50)
Hydromorphone: 108 ng/mL (ref <50)
Morphine Urine: NEGATIVE ng/mL (ref <50)
Norhydrocodone, Ur: 254 ng/mL (ref <50)
Noroxycodone, Ur: NEGATIVE ng/mL (ref <50)
OXYCODONE, UR: NEGATIVE ng/mL (ref <50)
OXYMORPHONE, URINE: NEGATIVE ng/mL (ref <50)

## 2014-06-09 LAB — PRESCRIPTION MONITORING PROFILE (SOLSTAS)
Amphetamine/Meth: NEGATIVE ng/mL
Barbiturate Screen, Urine: NEGATIVE ng/mL
Benzodiazepine Screen, Urine: NEGATIVE ng/mL
Buprenorphine, Urine: NEGATIVE ng/mL
CANNABINOID SCRN UR: NEGATIVE ng/mL
CARISOPRODOL, URINE: NEGATIVE ng/mL
COCAINE METABOLITES: NEGATIVE ng/mL
Creatinine, Urine: 64.88 mg/dL (ref >20.0)
Fentanyl, Ur: NEGATIVE ng/mL
MDMA URINE: NEGATIVE ng/mL
MEPERIDINE UR: NEGATIVE ng/mL
Methadone Screen, Urine: NEGATIVE ng/mL
Nitrites, Initial: NEGATIVE ug/mL
Oxycodone Screen, Ur: NEGATIVE ng/mL
PROPOXYPHENE: NEGATIVE ng/mL
TAPENTADOLUR: NEGATIVE ng/mL
TRAMADOL UR: NEGATIVE ng/mL
Zolpidem, Urine: NEGATIVE ng/mL
pH, Initial: 5.8 pH (ref 4.5–8.9)

## 2014-06-27 NOTE — Progress Notes (Signed)
Urine drug screen for this encounter is consistent for prescribed medication 

## 2014-08-03 ENCOUNTER — Encounter: Payer: Self-pay | Admitting: Registered Nurse

## 2014-08-03 ENCOUNTER — Encounter: Payer: Medicare HMO | Attending: Physical Medicine & Rehabilitation | Admitting: Registered Nurse

## 2014-08-03 VITALS — BP 156/90 | HR 69 | Resp 14

## 2014-08-03 DIAGNOSIS — G894 Chronic pain syndrome: Secondary | ICD-10-CM | POA: Insufficient documentation

## 2014-08-03 DIAGNOSIS — G5790 Unspecified mononeuropathy of unspecified lower limb: Secondary | ICD-10-CM | POA: Insufficient documentation

## 2014-08-03 DIAGNOSIS — M47817 Spondylosis without myelopathy or radiculopathy, lumbosacral region: Secondary | ICD-10-CM

## 2014-08-03 DIAGNOSIS — Z79899 Other long term (current) drug therapy: Secondary | ICD-10-CM

## 2014-08-03 DIAGNOSIS — R1030 Lower abdominal pain, unspecified: Secondary | ICD-10-CM | POA: Insufficient documentation

## 2014-08-03 DIAGNOSIS — M150 Primary generalized (osteo)arthritis: Secondary | ICD-10-CM | POA: Insufficient documentation

## 2014-08-03 DIAGNOSIS — Z5181 Encounter for therapeutic drug level monitoring: Secondary | ICD-10-CM | POA: Insufficient documentation

## 2014-08-03 DIAGNOSIS — R1032 Left lower quadrant pain: Secondary | ICD-10-CM

## 2014-08-03 DIAGNOSIS — M7581 Other shoulder lesions, right shoulder: Secondary | ICD-10-CM | POA: Insufficient documentation

## 2014-08-03 MED ORDER — HYDROCODONE-ACETAMINOPHEN 10-325 MG PO TABS: 1.0000 | ORAL_TABLET | Freq: Two times a day (BID) | ORAL | Status: DC | PRN

## 2014-08-03 NOTE — Progress Notes (Signed)
Subjective:    Patient ID: Shaun RichardsCurtis L Rosekrans, male    DOB: 07-19-1955, 59 y.o.   MRN: [id number]  HPI: Mr. Shaun RichardsCurtis L Boullion is a 59 year old male who returns for follow up for chronic pain and medication refill. He says his pain is located in his neck, left groin and lower back. He rates his pain 9. His current exercise regime performing stretching exercises and walking.    Pain Inventory Average Pain 8 Pain Right Now 9 My pain is sharp, stabbing, tingling and aching  In the last 24 hours, has pain interfered with the following? General activity 9 Relation with others 10 Enjoyment of life 10 What TIME of day is your pain at its worst? daytime, night Sleep (in general) Poor  Pain is worse with: walking, bending and some activites Pain improves with: medication Relief from Meds: 8  Mobility walk without assistance how many minutes can you walk? 20 ability to climb steps?  yes do you drive?  yes Do you have any goals in this area?  yes  Function employed # of hrs/week 15 disabled: date disabled . I need assistance with the following:  household duties Do you have any goals in this area?  yes  Neuro/Psych tremor tingling spasms  Prior Studies Any changes since last visit?  no  Physicians involved in your care Any changes since last visit?  no   Family History  Problem Relation Age of Onset  . Cancer Sister     breast   History   Social History  . Marital Status: Single    Spouse Name: N/A  . Number of Children: N/A  . Years of Education: N/A   Social History Main Topics  . Smoking status: Current Every Day Smoker -- 1.00 packs/day    Types: Cigarettes  . Smokeless tobacco: Never Used  . Alcohol Use: No  . Drug Use: No  . Sexual Activity: Not on file   Other Topics Concern  . None   Social History Narrative   Past Surgical History  Procedure Laterality Date  . Neck surgery  07/21/02 and 01/2006  . Hernia repair  04/1998    [facility]  . Wrist surgery   1992    right   Past Medical History  Diagnosis Date  . Hyperlipidemia   . Hypertension   . DJD (degenerative joint disease)     disabilty secondary to neck issues  . Bell's palsy April 2010  . Chest pain with low risk for cardiac etiology     Low Risk Myoview Sept 2013  . Smoker    BP 156/90 mmHg  Pulse 69  Resp 14  SpO2 100%  Opioid Risk Score:   Fall Risk Score: Low Fall Risk (0-5 points)`1  Depression screen PHQ 2/9  Depression screen PHQ 2/9 06/05/2014  Decreased Interest 1  Down, Depressed, Hopeless 0  PHQ - 2 Score 1  Altered sleeping 3  Tired, decreased energy 2  Change in appetite 1  Feeling bad or failure about yourself  0  Trouble concentrating 2  Moving slowly or fidgety/restless 0  Suicidal thoughts 0  PHQ-9 Score 9     Review of Systems  Constitutional:       Niught sweats Poor appetite   Gastrointestinal: Positive for constipation.  Endocrine:       High blood sugar  Neurological: Positive for tremors.       Tingling Spasms   All other systems reviewed and are negative.  Objective:   Physical Exam  Constitutional: He is oriented to person, place, and time. He appears well-developed and well-nourished.  HENT:  Head: Normocephalic and atraumatic.  Neck: Normal range of motion. Neck supple.  Cardiovascular: Normal rate and regular rhythm.   Pulmonary/Chest: Effort normal and breath sounds normal.  Musculoskeletal:  Normal Muscle Bulk and Muscle Testing Reveals: Upper Extremities: Full ROM and Muscle Strength 5/5 Back without spinal or paraspinal tenderness Lower Extremities: Full ROM and Muscle Strength 5/5 Arises from chair with ease Narrow Based gait  Neurological: He is alert and oriented to person, place, and time.  Skin: Skin is warm and dry.  Psychiatric: He has a normal mood and affect.  Nursing note and vitals reviewed.         Assessment & Plan:  1. Chronic left inguinal pain: S/P inguinal hernia (indirect) and  mesh repair. Continue to Monitor Refilled: Hydrocodone 10/325 mg one tablet every 12 hours as needed #60. Second prescription given 2. Back Pain: Continue with Exercise regime. Continue to Monitor  20 minutes of face to face patient care time was spent during this visit. All questions were encouraged and answered.   F/U in 2 months

## 2014-10-08 ENCOUNTER — Encounter: Payer: Medicare HMO | Attending: Physical Medicine & Rehabilitation | Admitting: Registered Nurse

## 2014-10-08 ENCOUNTER — Encounter: Payer: Self-pay | Admitting: Registered Nurse

## 2014-10-08 VITALS — BP 152/89 | HR 60 | Resp 14

## 2014-10-08 DIAGNOSIS — R1032 Left lower quadrant pain: Secondary | ICD-10-CM

## 2014-10-08 DIAGNOSIS — G5790 Unspecified mononeuropathy of unspecified lower limb: Secondary | ICD-10-CM | POA: Insufficient documentation

## 2014-10-08 DIAGNOSIS — M150 Primary generalized (osteo)arthritis: Secondary | ICD-10-CM | POA: Diagnosis present

## 2014-10-08 DIAGNOSIS — M47817 Spondylosis without myelopathy or radiculopathy, lumbosacral region: Secondary | ICD-10-CM | POA: Diagnosis not present

## 2014-10-08 DIAGNOSIS — M7581 Other shoulder lesions, right shoulder: Secondary | ICD-10-CM | POA: Diagnosis present

## 2014-10-08 DIAGNOSIS — G894 Chronic pain syndrome: Secondary | ICD-10-CM | POA: Diagnosis present

## 2014-10-08 DIAGNOSIS — Z5181 Encounter for therapeutic drug level monitoring: Secondary | ICD-10-CM | POA: Insufficient documentation

## 2014-10-08 DIAGNOSIS — R1030 Lower abdominal pain, unspecified: Secondary | ICD-10-CM

## 2014-10-08 DIAGNOSIS — Z79899 Other long term (current) drug therapy: Secondary | ICD-10-CM

## 2014-10-08 MED ORDER — HYDROCODONE-ACETAMINOPHEN 10-325 MG PO TABS: 1.0000 | ORAL_TABLET | Freq: Two times a day (BID) | ORAL | Status: DC | PRN

## 2014-10-08 NOTE — Progress Notes (Signed)
Subjective:    Patient ID: Shaun Lang, male    DOB: Jan 22, 1956, 59 y.o.   MRN: [id number]  HPI: Shaun Lang is a 59 year old male who returns for follow up for chronic pain and medication refill. He says his pain is located in his right hand and lower back. He rates his pain 7. His current exercise regime performing stretching exercises and walking.   Pain Inventory Average Pain 10 Pain Right Now 7 My pain is intermittent, stabbing, tingling and aching  In the last 24 hours, has pain interfered with the following? General activity 8 Relation with others 9 Enjoyment of life 9 What TIME of day is your pain at its worst? VARIES Sleep (in general) Fair  Pain is worse with: bending, sitting, standing and some activites Pain improves with: rest Relief from Meds: 3  Mobility walk without assistance how many minutes can you walk? 20 ability to climb steps?  yes do you drive?  yes  Function retired  Neuro/Psych tingling spasms dizziness  Prior Studies Any changes since last visit?  no  Physicians involved in your care Any changes since last visit?  no   Family History  Problem Relation Age of Onset  . Cancer Sister     breast   History   Social History  . Marital Status: Single    Spouse Name: N/A  . Number of Children: N/A  . Years of Education: N/A   Social History Main Topics  . Smoking status: Current Every Day Smoker -- 1.00 packs/day    Types: Cigarettes  . Smokeless tobacco: Never Used  . Alcohol Use: No  . Drug Use: No  . Sexual Activity: Not on file   Other Topics Concern  . None   Social History Narrative   Past Surgical History  Procedure Laterality Date  . Neck surgery  07/21/02 and 01/2006  . Hernia repair  04/1998    [facility]  . Wrist surgery  1992    right   Past Medical History  Diagnosis Date  . Hyperlipidemia   . Hypertension   . DJD (degenerative joint disease)     disabilty secondary to neck issues  . Bell's palsy  April 2010  . Chest pain with low risk for cardiac etiology     Low Risk Myoview Sept 2013  . Smoker    BP 152/89 mmHg  Pulse 60  Resp 14  SpO2 100%  Opioid Risk Score:   Fall Risk Score: Low Fall Risk (0-5 points)`1  Depression screen PHQ 2/9  Depression screen PHQ 2/9 06/05/2014  Decreased Interest 1  Down, Depressed, Hopeless 0  PHQ - 2 Score 1  Altered sleeping 3  Tired, decreased energy 2  Change in appetite 1  Feeling bad or failure about yourself  0  Trouble concentrating 2  Moving slowly or fidgety/restless 0  Suicidal thoughts 0  PHQ-9 Score 9     Review of Systems  Constitutional:       Night sweats  HENT: Negative.   Respiratory: Positive for shortness of breath.   Cardiovascular: Negative.   Gastrointestinal: Negative.   Endocrine: Negative.   Genitourinary: Negative.   Musculoskeletal: Positive for back pain, arthralgias and neck pain.       Pain in right shoulder sometimes  Skin: Negative.   Allergic/Immunologic: Negative.   Neurological: Positive for dizziness.       Tingling, spasms  Hematological: Negative.   Psychiatric/Behavioral: Negative.  Objective:   Physical Exam  Constitutional: He is oriented to person, place, and time. He appears well-developed and well-nourished.  HENT:  Head: Normocephalic and atraumatic.  Neck: Normal range of motion. Neck supple.  Cardiovascular: Normal rate and regular rhythm.   Pulmonary/Chest: Effort normal and breath sounds normal.  Musculoskeletal:  Normal Muscle Bulk and Muscle Testing Reveals: Upper Extremities: Full ROM and Muscle Strength 5/5 Lumbar Paraspinal Tenderness: L-3- L-4 Lower Extremities: Full ROM and Muscle Strength 5/5 Arises from chair with ease Narrow Based Gait   Neurological: He is alert and oriented to person, place, and time.  Skin: Skin is warm and dry.  Psychiatric: He has a normal mood and affect.  Nursing note and vitals reviewed.         Assessment & Plan:    1. Chronic left inguinal pain: S/P inguinal hernia (indirect) and mesh repair. Continue to Monitor Refilled: Hydrocodone 10/325 mg one tablet every 12 hours as needed #60. Second prescription given for following month 2. Back Pain: Continue with Exercise regime. Continue to Monitor  20 minutes of face to face patient care time was spent during this visit. All questions were encouraged and answered.   F/U in 2 months

## 2014-10-19 ENCOUNTER — Encounter: Payer: Self-pay | Admitting: Cardiovascular Disease

## 2014-10-19 ENCOUNTER — Ambulatory Visit (INDEPENDENT_AMBULATORY_CARE_PROVIDER_SITE_OTHER): Payer: Medicare HMO | Admitting: Cardiovascular Disease

## 2014-10-19 VITALS — BP 126/90 | HR 67 | Ht 68.0 in | Wt 188.2 lb

## 2014-10-19 DIAGNOSIS — E785 Hyperlipidemia, unspecified: Secondary | ICD-10-CM

## 2014-10-19 DIAGNOSIS — Z79899 Other long term (current) drug therapy: Secondary | ICD-10-CM | POA: Diagnosis not present

## 2014-10-19 DIAGNOSIS — I10 Essential (primary) hypertension: Secondary | ICD-10-CM | POA: Diagnosis not present

## 2014-10-19 NOTE — Assessment & Plan Note (Signed)
Continued tobacco abuse of one pack per day. Recalcitrant to risk factor modification.

## 2014-10-19 NOTE — Assessment & Plan Note (Signed)
History of hyperlipidemia on atorvastatin. We will recheck a lipid and liver profile 

## 2014-10-19 NOTE — Assessment & Plan Note (Signed)
History of hypertension or blood pressure measured 126/90. He is on amlodipine. Continue current meds at current dosing

## 2014-10-19 NOTE — Patient Instructions (Signed)
Your physician recommends that you return for lab work at your earliest convenience - FASTING.  Dr Berry recommends that you schedule a follow-up appointment in 1 year. You will receive a reminder letter in the mail two months in advance. If you don't receive a letter, please call our office to schedule the follow-up appointment. 

## 2014-10-19 NOTE — Progress Notes (Signed)
     10/19/2014 ENES ROKOSZ   10-17-1955  [id number]  Primary Physician No primary care provider on file. Primary Cardiologist: Runell Gess MD [name]   HPI:  The patient is a 59 year old thin appearing divorced African American male father of 4, grandfather of 5 grandchildren who I last saw 12months ago. He was referred for chest pain. He had a negative Myoview. His pain has essentially resolved. Risk factors include continued tobacco abuse of 1 pack per day, hypertension, hyperlipidemia. I did start him on atorvastatin. He is resistant to risk factor modification. Since I saw him one year ago he really denies chest pain. He currently works at [employer].  Current Outpatient Prescriptions  Medication Sig Dispense Refill  . amLODipine (NORVASC) 5 MG tablet Take 5 mg by mouth daily.    [name] atorvastatin (LIPITOR) 20 MG tablet TAKE ONE TABLET BY MOUTH EVERY DAY 30 tablet 10  . cyclobenzaprine (FLEXERIL) 10 MG tablet Take 1 tablet (10 mg total) by mouth 2 (two) times daily as needed for muscle spasms. 60 tablet 3  . HYDROcodone-acetaminophen (NORCO) 10-325 MG per tablet Take 1 tablet by mouth every 12 (twelve) hours as needed. 60 tablet 0  . meloxicam (MOBIC) 15 MG tablet Take 15 mg by mouth daily.     No current facility-administered medications for this visit.    No Known Allergies  History   Social History  . Marital Status: Single    Spouse Name: N/A  . Number of Children: N/A  . Years of Education: N/A   Occupational History  . Not on file.   Social History Main Topics  . Smoking status: Current Every Day Smoker -- 1.00 packs/day    Types: Cigarettes  . Smokeless tobacco: Never Used  . Alcohol Use: No  . Drug Use: No  . Sexual Activity: Not on file   Other Topics Concern  . Not on file   Social History Narrative     Review of Systems: General: negative for chills, fever, night sweats or weight changes.  Cardiovascular:  negative for chest pain, dyspnea on exertion, edema, orthopnea, palpitations, paroxysmal nocturnal dyspnea or shortness of breath Dermatological: negative for rash Respiratory: negative for cough or wheezing Urologic: negative for hematuria Abdominal: negative for nausea, vomiting, diarrhea, bright red blood per rectum, melena, or hematemesis Neurologic: negative for visual changes, syncope, or dizziness All other systems reviewed and are otherwise negative except as noted above.    Blood pressure 126/90, pulse 67, height  (1.727 m), weight 188 lb 3.2 oz (85.367 kg).  General appearance: alert and no distress Neck: no adenopathy, no carotid bruit, no JVD, supple, symmetrical, trachea midline and thyroid not enlarged, symmetric, no tenderness/mass/nodules Lungs: clear to auscultation bilaterally Heart: regular rate and rhythm, S1, S2 normal, no murmur, click, rub or gallop Extremities: extremities normal, atraumatic, no cyanosis or edema  EKG normal sinus rhythm at 67 with evidence of LVH with repolarization changes  ASSESSMENT AND PLAN:   Smoker Continued tobacco abuse of one pack per day. Recalcitrant to risk factor modification.  Hypertension History of hypertension or blood pressure measured 126/90. He is on amlodipine. Continue current meds at current dosing  Hyperlipidemia History of hyperlipidemia on atorvastatin. We will recheck a lipid and liver profile      Runell Gess MD [facility], [facility] 10/19/2014 8:20 AM

## 2014-11-07 LAB — LIPID PANEL
CHOL/HDL RATIO: 3 ratio (ref <=5.0)
CHOLESTEROL: 198 mg/dL (ref 125–200)
HDL: 65 mg/dL (ref >=40)
LDL Cholesterol: 116 mg/dL (ref <130)
Triglycerides: 87 mg/dL (ref <150)
VLDL: 17 mg/dL (ref <30)

## 2014-11-07 LAB — HEPATIC FUNCTION PANEL
ALK PHOS: 49 U/L (ref 40–115)
ALT: 15 U/L (ref 9–46)
AST: 16 U/L (ref 10–35)
Albumin: 4.3 g/dL (ref 3.6–5.1)
BILIRUBIN TOTAL: 0.4 mg/dL (ref 0.2–1.2)
Bilirubin, Direct: 0.1 mg/dL (ref <=0.2)
Indirect Bilirubin: 0.3 mg/dL (ref 0.2–1.2)
Total Protein: 6.7 g/dL (ref 6.1–8.1)

## 2014-11-08 ENCOUNTER — Telehealth: Payer: Self-pay

## 2014-11-08 ENCOUNTER — Other Ambulatory Visit: Payer: Self-pay

## 2014-11-08 DIAGNOSIS — E785 Hyperlipidemia, unspecified: Secondary | ICD-10-CM

## 2014-11-08 DIAGNOSIS — Z79899 Other long term (current) drug therapy: Secondary | ICD-10-CM

## 2014-11-08 MED ORDER — ATORVASTATIN CALCIUM 40 MG PO TABS: 40.0000 mg | ORAL_TABLET | Freq: Every day | ORAL | Status: DC

## 2014-11-08 NOTE — Telephone Encounter (Signed)
-----   Message from Runell Gess, MD sent at 11/07/2014  4:13 PM EDT ----- Lipid and liver profile still not at goal for primary prevention. Increase Atorva to next higher lose and re check

## 2014-11-08 NOTE — Telephone Encounter (Signed)
Atorvastatin increased to 40 mg. A new prescription has been sent to patient's electronically. Patient notified to recheck labs 6-8 weeks. Labs ordered, released, and will be mailed to patient along with lab results.

## 2014-12-03 ENCOUNTER — Encounter: Payer: Self-pay | Admitting: Registered Nurse

## 2014-12-03 ENCOUNTER — Encounter: Payer: Medicare HMO | Attending: Physical Medicine & Rehabilitation | Admitting: Registered Nurse

## 2014-12-03 ENCOUNTER — Other Ambulatory Visit: Payer: Self-pay | Admitting: Registered Nurse

## 2014-12-03 VITALS — BP 144/82 | HR 62

## 2014-12-03 DIAGNOSIS — R1030 Lower abdominal pain, unspecified: Secondary | ICD-10-CM | POA: Diagnosis present

## 2014-12-03 DIAGNOSIS — M47817 Spondylosis without myelopathy or radiculopathy, lumbosacral region: Secondary | ICD-10-CM

## 2014-12-03 DIAGNOSIS — Z79899 Other long term (current) drug therapy: Secondary | ICD-10-CM | POA: Diagnosis not present

## 2014-12-03 DIAGNOSIS — M150 Primary generalized (osteo)arthritis: Secondary | ICD-10-CM | POA: Insufficient documentation

## 2014-12-03 DIAGNOSIS — M7581 Other shoulder lesions, right shoulder: Secondary | ICD-10-CM | POA: Insufficient documentation

## 2014-12-03 DIAGNOSIS — G5790 Unspecified mononeuropathy of unspecified lower limb: Secondary | ICD-10-CM | POA: Diagnosis present

## 2014-12-03 DIAGNOSIS — Z5181 Encounter for therapeutic drug level monitoring: Secondary | ICD-10-CM | POA: Diagnosis not present

## 2014-12-03 DIAGNOSIS — R1032 Left lower quadrant pain: Secondary | ICD-10-CM

## 2014-12-03 DIAGNOSIS — G894 Chronic pain syndrome: Secondary | ICD-10-CM | POA: Insufficient documentation

## 2014-12-03 MED ORDER — HYDROCODONE-ACETAMINOPHEN 10-325 MG PO TABS: 1.0000 | ORAL_TABLET | Freq: Two times a day (BID) | ORAL | Status: DC | PRN

## 2014-12-03 NOTE — Progress Notes (Signed)
Subjective:    Patient ID: Shaun Lang, male    DOB: 22-Dec-1955, 59 y.o.   MRN: [id number]  HPI: [name] is a 59 year old male who returns for follow up for chronic pain and medication refill. He says his pain is located in his right hand and  lower back. He rates his pain 8. His current exercise regime performing stretching exercises and walking.   Pain Inventory Average Pain 9 Pain Right Now 8 My pain is sharp, burning, tingling and aching  In the last 24 hours, has pain interfered with the following? General activity 9 Relation with others 10 Enjoyment of life 10 What TIME of day is your pain at its worst? daytime, night Sleep (in general) NA  Pain is worse with: walking, bending, standing and some activites Pain improves with: na Relief from Meds: na  Mobility how many minutes can you walk? 15 ability to climb steps?  yes do you drive?  yes Do you have any goals in this area?  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  no  Physicians involved in your care na   Family History  Problem Relation Age of Onset  . Cancer Sister     breast   Social History   Social History  . Marital Status: Single    Spouse Name: N/A  . Number of Children: N/A  . Years of Education: N/A   Social History Main Topics  . Smoking status: Current Every Day Smoker -- 1.00 packs/day    Types: Cigarettes  . Smokeless tobacco: Never Used  . Alcohol Use: No  . Drug Use: No  . Sexual Activity: Not Asked   Other Topics Concern  . None   Social History Narrative   Past Surgical History  Procedure Laterality Date  . Neck surgery  07/21/02 and 01/2006  . Hernia repair  04/1998    [facility]  . Wrist surgery  1992    right   Past Medical History  Diagnosis Date  . Hyperlipidemia   . Hypertension   . DJD (degenerative joint disease)     disabilty secondary to  neck issues  . Bell's palsy April 2010  . Chest pain with low risk for cardiac etiology     Low Risk Myoview Sept 2013  . Smoker    BP 144/82 mmHg  Pulse 62  SpO2 99%  Opioid Risk Score:   Fall Risk Score:  `1  Depression screen PHQ 2/9  Depression screen [facility] 2/9 12/03/2014 06/05/2014  Decreased Interest 2 1  Down, Depressed, Hopeless 2 0  PHQ - 2 Score 4 1  Altered sleeping - 3  Tired, decreased energy - 2  Change in appetite - 1  Feeling bad or failure about yourself  - 0  Trouble concentrating - 2  Moving slowly or fidgety/restless - 0  Suicidal thoughts - 0  PHQ-9 Score - 9     Review of Systems  Constitutional:       Night sweats  Respiratory: Positive for wheezing.   Gastrointestinal: Positive for constipation.  All other systems reviewed and are negative.      Objective:   Physical Exam  Constitutional: He is oriented to person, place, and time. He appears well-developed and well-nourished.  HENT:  Head: Normocephalic and atraumatic.  Neck: Normal range of motion. Neck supple.  Cardiovascular: Normal rate and  regular rhythm.   Pulmonary/Chest: Effort normal and breath sounds normal.  Musculoskeletal:  Normal Muscle Bulk and Muscle Testing Reveals: Upper Extremities: Full ROM and Muscle Strength 5/5 Back without spinal or paraspinal tenderness Lower Extremities: Full ROM and Muscle Strength 5/5 Arises from chair with ease Narrow Based gait  Neurological: He is alert and oriented to person, place, and time.  Skin: Skin is warm and dry.  Psychiatric: He has a normal mood and affect.  Nursing note and vitals reviewed.         Assessment & Plan:  1. Chronic left inguinal pain: S/P inguinal hernia (indirect) and mesh repair. Continue to Monitor Refilled: Hydrocodone 10/325 mg one tablet every 12 hours as needed #60. Second prescription given for following month 2. Back Pain: Continue with Exercise regime. Continue to Monitor  20 minutes of face to  face patient care time was spent during this visit. All questions were encouraged and answered.   F/U in 2 months

## 2014-12-04 LAB — PMP ALCOHOL METABOLITE (ETG)

## 2014-12-08 LAB — PRESCRIPTION MONITORING PROFILE (SOLSTAS)
Amphetamine/Meth: NEGATIVE ng/mL
BENZODIAZEPINE SCREEN, URINE: NEGATIVE ng/mL
Barbiturate Screen, Urine: NEGATIVE ng/mL
Buprenorphine, Urine: NEGATIVE ng/mL
COCAINE METABOLITES: NEGATIVE ng/mL
CREATININE, URINE: 115.31 mg/dL (ref >20.0)
Cannabinoid Scrn, Ur: NEGATIVE ng/mL
Carisoprodol, Urine: NEGATIVE ng/mL
ECSTASY: NEGATIVE ng/mL
FENTANYL URINE: NEGATIVE ng/mL
MEPERIDINE UR: NEGATIVE ng/mL
METHADONE SCREEN, URINE: NEGATIVE ng/mL
NITRITES URINE, INITIAL: NEGATIVE ug/mL
OXYCODONE SCRN UR: NEGATIVE ng/mL
PH URINE, INITIAL: 5.2 pH (ref 4.5–8.9)
Propoxyphene: NEGATIVE ng/mL
TAPENTADOLUR: NEGATIVE ng/mL
Tramadol Scrn, Ur: NEGATIVE ng/mL
Zolpidem, Urine: NEGATIVE ng/mL

## 2014-12-08 LAB — OPIATES/OPIOIDS (LC/MS-MS)
CODEINE URINE: NEGATIVE ng/mL (ref <50)
HYDROCODONE: 1842 ng/mL (ref <50)
Hydromorphone: 242 ng/mL (ref <50)
MORPHINE: NEGATIVE ng/mL (ref <50)
NORHYDROCODONE, UR: 1297 ng/mL (ref <50)
Noroxycodone, Ur: NEGATIVE ng/mL (ref <50)
OXYMORPHONE, URINE: NEGATIVE ng/mL (ref <50)
Oxycodone, ur: NEGATIVE ng/mL (ref <50)

## 2014-12-08 LAB — ETHYL GLUCURONIDE, URINE
ETGU: 774 ng/mL — AB (ref <500)
Ethyl Sulfate (ETS): 535 ng/mL — ABNORMAL HIGH (ref <100)

## 2014-12-19 NOTE — Progress Notes (Signed)
Urine drug screen for this encounter is consistent for prescribed medication but positive for alcohol.  He was given a verbal warning in March of 2014 for a previous positive.  A formal warning letter will be mailed.

## 2015-01-30 ENCOUNTER — Encounter: Payer: Medicare HMO | Attending: Physical Medicine & Rehabilitation | Admitting: Physical Medicine & Rehabilitation

## 2015-01-30 ENCOUNTER — Encounter: Payer: Self-pay | Admitting: Physical Medicine & Rehabilitation

## 2015-01-30 VITALS — BP 124/77 | HR 62 | Resp 14

## 2015-01-30 DIAGNOSIS — R1032 Left lower quadrant pain: Secondary | ICD-10-CM

## 2015-01-30 DIAGNOSIS — Z5181 Encounter for therapeutic drug level monitoring: Secondary | ICD-10-CM | POA: Diagnosis present

## 2015-01-30 DIAGNOSIS — G5790 Unspecified mononeuropathy of unspecified lower limb: Secondary | ICD-10-CM | POA: Insufficient documentation

## 2015-01-30 DIAGNOSIS — R1030 Lower abdominal pain, unspecified: Secondary | ICD-10-CM | POA: Insufficient documentation

## 2015-01-30 DIAGNOSIS — M1990 Unspecified osteoarthritis, unspecified site: Secondary | ICD-10-CM

## 2015-01-30 DIAGNOSIS — M7581 Other shoulder lesions, right shoulder: Secondary | ICD-10-CM | POA: Insufficient documentation

## 2015-01-30 DIAGNOSIS — M75102 Unspecified rotator cuff tear or rupture of left shoulder, not specified as traumatic: Secondary | ICD-10-CM | POA: Insufficient documentation

## 2015-01-30 DIAGNOSIS — M150 Primary generalized (osteo)arthritis: Secondary | ICD-10-CM | POA: Insufficient documentation

## 2015-01-30 DIAGNOSIS — M19012 Primary osteoarthritis, left shoulder: Secondary | ICD-10-CM | POA: Insufficient documentation

## 2015-01-30 DIAGNOSIS — G894 Chronic pain syndrome: Secondary | ICD-10-CM | POA: Diagnosis present

## 2015-01-30 MED ORDER — HYDROCODONE-ACETAMINOPHEN 10-325 MG PO TABS: 1.0000 | ORAL_TABLET | Freq: Two times a day (BID) | ORAL | Status: DC | PRN

## 2015-01-30 NOTE — Patient Instructions (Signed)
PLEASE DO NOT MAKE CHANGES TO YOUR MEDICATION REGIMEN (MEDS WE PRESCRIBE) WITHOUT CONTACTING US FIRST.  PLEASE CALL ME WITH ANY PROBLEMS OR QUESTIONS [phone]).  HAVE A GOOD DAY!

## 2015-01-30 NOTE — Progress Notes (Signed)
Subjective:    Patient ID: Shaun Lang, male    DOB: December 02, 1955, 59 y.o.   MRN: [id number]  HPI  Shaun Lang is here in follow up of his chronic pain. [facility] was given a steroid injection and a HEP. He has been taking more of his hydrocodone as a result. The steroid injection helped the lateral shoulder pain but not his medial/clavicular area.   His left inguinal pain has been up and down. It tends to bother him more in the evening after he's been working.      Pain Inventory Average Pain 9 Pain Right Now 9 My pain is sharp, stabbing, tingling and aching  In the last 24 hours, has pain interfered with the following? General activity 10 Relation with others 9 Enjoyment of life 10 What TIME of day is your pain at its worst? morning, evening  Sleep (in general) Poor  Pain is worse with: walking and some activites Pain improves with: medication and injections Relief from Meds: 5  Mobility walk without assistance how many minutes can you walk? 10 ability to climb steps?  yes do you drive?  yes Do you have any goals in this area?  yes  Function employed # of hrs/week 15  Neuro/Psych weakness numbness tingling spasms  Prior Studies Any changes since last visit?  no  Physicians involved in your care Any changes since last visit?  no   Family History  Problem Relation Age of Onset  . Cancer Sister     breast   Social History   Social History  . Marital Status: Single    Spouse Name: N/A  . Number of Children: N/A  . Years of Education: N/A   Social History Main Topics  . Smoking status: Current Every Day Smoker -- 1.00 packs/day    Types: Cigarettes  . Smokeless tobacco: Never Used  . Alcohol Use: No  . Drug Use: No  . Sexual Activity: Not Asked   Other Topics Concern  . None   Social History Narrative   Past Surgical History  Procedure Laterality Date  . Neck surgery  07/21/02 and 01/2006  . Hernia  repair  04/1998    [facility]  . Wrist surgery  1992    right   Past Medical History  Diagnosis Date  . Hyperlipidemia   . Hypertension   . DJD (degenerative joint disease)     disabilty secondary to neck issues  . Bell's palsy April 2010  . Chest pain with low risk for cardiac etiology     Low Risk Myoview Sept 2013  . Smoker    BP 124/77 mmHg  Pulse 62  Resp 14  SpO2 100%  Opioid Risk Score:   Fall Risk Score:  `1  Depression screen PHQ 2/9  [facility] 2/9 12/03/2014 06/05/2014  Decreased Interest 2 1  Down, Depressed, Hopeless 2 0  PHQ - 2 Score 4 1  Altered sleeping - 3  Tired, decreased energy - 2  Change in appetite - 1  Feeling bad or failure about yourself  - 0  Trouble concentrating - 2  Moving slowly or fidgety/restless - 0  Suicidal thoughts - 0  PHQ-9 Score - 9     Review of Systems  Constitutional: Positive for diaphoresis, appetite change and unexpected weight change.  Respiratory: Positive for apnea.   Gastrointestinal: Positive for constipation.  Musculoskeletal:       Spasms  Neurological: Positive  for weakness and numbness.       Tingling  All other systems reviewed and are negative.      Objective:   Physical Exam  General: Alert and oriented x 3, No apparent distress  HEENT: Head is normocephalic, atraumatic, PERRLA, EOMI, sclera anicteric, oral mucosa pink and moist, dentition intact, ext ear canals clear,  Neck: Supple without JVD or lymphadenopathy  Heart: Reg rate and rhythm. No murmurs rubs or gallops  Chest: CTA bilaterally without wheezes, rales, or rhonchi; no distress  Abdomen: Soft, non-tender, non-distended, bowel sounds positive.  Extremities: No clubbing, cyanosis, or edema. Pulses are 2+  Skin: Clean and intact without signs of breakdown  Neuro: Pt is cognitively appropriate with normal insight, memory, and awareness. Cranial nerves 2-12 are intact. Sensory exam is normal. Reflexes are 2+ in all 4's. Fine motor  coordination is intact. No tremors. Motor function is grossly 5/5 in all muscles tested.  Musculoskeletal: mild tenderness at the left inguinal line with palpable mesh just medial to the ASIS and superior to the inguinal ligament. Both knees with mild crepitus and minimal pain.. Minimal pain with lumbar ROM. leftt shoulder is tender with ER/IR. Left AC joint tender. Cross armed maneuver equivocal.    Psych: Pt's affect is appropriate. Pt is cooperative   Assessment & Plan:   1. Chronic left inguinal pain- appears to be related to his prior inguinal hernia (indirect) and mesh repair. This pain appears to be more related to scar tissue, muscle imbalance, posture, etc. Pain worsens with activity. I have concerns this could be hip joint as well  2. Left rotator cuff syndrome/bursitis and associated left AC joint arthritis 3. Mild CTS  Plan:  1. STILL Needs to continue to take breaks to help with his work tolerance. Proper technique was emphasized.  2. Continue neurontin at  bid to tid.  3. Refilled hydrocodone today with second refill for next month. Advised him to call us if he makes any changes to his schedule.  4.  After informed consent and preparation of the skin with betadine and isopropyl alcohol, I injected  (1cc) of celestone and 4cc of 1% lidocaine into  the left AC joint via anterior approach. Additionally, aspiration was performed prior to injection. The patient tolerated well, and no complications were encountered. Afterward the area was cleaned and dressed. Post- injection instructions were provided.   5. Follow up with me in 2 months. 15 minutes of face to face patient care time were spent during this visit. All questions were encouraged and answered.

## 2015-03-27 ENCOUNTER — Other Ambulatory Visit: Payer: Self-pay | Admitting: *Deleted

## 2015-03-27 DIAGNOSIS — R1032 Left lower quadrant pain: Secondary | ICD-10-CM

## 2015-03-27 DIAGNOSIS — G5790 Unspecified mononeuropathy of unspecified lower limb: Secondary | ICD-10-CM

## 2015-03-27 DIAGNOSIS — Z5181 Encounter for therapeutic drug level monitoring: Secondary | ICD-10-CM

## 2015-03-27 MED ORDER — HYDROCODONE-ACETAMINOPHEN 10-325 MG PO TABS: 1.0000 | ORAL_TABLET | Freq: Two times a day (BID) | ORAL | Status: DC | PRN

## 2015-03-27 NOTE — Telephone Encounter (Signed)
[name] called, his appointment 04/01/2015 was cancelled due to the doctor being out of office.  His appointment was rescheduled to 04/08/2015. He will run out of meds.  I checked Dr. Rosalyn ChartersSwartz's schedule and it is booked.  I consulted Dr. Riley KillSwartz. A script was printed and signed. Patient was called and advised that script could be picked up tomorrow. Patient was also told to come to his appointment on the 16th of June.

## 2015-04-01 ENCOUNTER — Ambulatory Visit: Payer: Medicare HMO | Admitting: Physical Medicine & Rehabilitation

## 2015-04-08 ENCOUNTER — Encounter: Payer: Medicare HMO | Attending: Physical Medicine & Rehabilitation | Admitting: Physical Medicine & Rehabilitation

## 2015-04-08 ENCOUNTER — Encounter: Payer: Self-pay | Admitting: Physical Medicine & Rehabilitation

## 2015-04-08 VITALS — BP 135/81 | HR 66 | Resp 14

## 2015-04-08 DIAGNOSIS — M7581 Other shoulder lesions, right shoulder: Secondary | ICD-10-CM | POA: Insufficient documentation

## 2015-04-08 DIAGNOSIS — R1030 Lower abdominal pain, unspecified: Secondary | ICD-10-CM | POA: Insufficient documentation

## 2015-04-08 DIAGNOSIS — Z5181 Encounter for therapeutic drug level monitoring: Secondary | ICD-10-CM

## 2015-04-08 DIAGNOSIS — G894 Chronic pain syndrome: Secondary | ICD-10-CM | POA: Insufficient documentation

## 2015-04-08 DIAGNOSIS — M150 Primary generalized (osteo)arthritis: Secondary | ICD-10-CM | POA: Insufficient documentation

## 2015-04-08 DIAGNOSIS — M1990 Unspecified osteoarthritis, unspecified site: Secondary | ICD-10-CM

## 2015-04-08 DIAGNOSIS — G5790 Unspecified mononeuropathy of unspecified lower limb: Secondary | ICD-10-CM | POA: Insufficient documentation

## 2015-04-08 DIAGNOSIS — G5792 Unspecified mononeuropathy of left lower limb: Secondary | ICD-10-CM

## 2015-04-08 DIAGNOSIS — R1032 Left lower quadrant pain: Secondary | ICD-10-CM

## 2015-04-08 DIAGNOSIS — M19012 Primary osteoarthritis, left shoulder: Secondary | ICD-10-CM

## 2015-04-08 DIAGNOSIS — Z79899 Other long term (current) drug therapy: Secondary | ICD-10-CM

## 2015-04-08 MED ORDER — HYDROCODONE-ACETAMINOPHEN 10-325 MG PO TABS: 1.0000 | ORAL_TABLET | Freq: Two times a day (BID) | ORAL | Status: DC | PRN

## 2015-04-08 MED ORDER — MELOXICAM 15 MG PO TABS: 15.0000 mg | ORAL_TABLET | Freq: Every day | ORAL | Status: DC

## 2015-04-08 NOTE — Progress Notes (Signed)
Subjective:    Patient ID: Shaun Lang, male    DOB: [birth year], 60 y.o.   MRN: [id number]  HPI  Shaun Lang is here in follow up of his chronic pain. He states that his pain is progressively getting worse. The injection i did in November helped a week or two but the pain has since come back. His outer shoulder is also tender. His inguinal region remains an ongoing issue as well.   He has stopped taking the meloxicam because he ran out a few months ago.   Pain Inventory Average Pain 10 Pain Right Now 10 My pain is sharp, burning, stabbing, tingling and aching  In the last 24 hours, has pain interfered with the following? General activity 9 Relation with others 10 Enjoyment of life 10 What TIME of day is your pain at its worst? morning, daytime, evening, night Sleep (in general) NA  Pain is worse with: walking, bending, standing and some activites Pain improves with: NA Relief from Meds: 7  Mobility how many minutes can you walk? a few ability to climb steps?  yes do you drive?  yes Do you have any goals in this area?  yes  Function employed # of hrs/week 15 Do you have any goals in this area?  yes  Neuro/Psych spasms dizziness  Prior Studies Any changes since last visit?  no  Physicians involved in your care Primary care .   Family History  Problem Relation Age of Onset  . Cancer Sister     breast   Social History   Social History  . Marital Status: Single    Spouse Name: N/A  . Number of Children: N/A  . Years of Education: N/A   Social History Main Topics  . Smoking status: Current Every Day Smoker -- 1.00 packs/day    Types: Cigarettes  . Smokeless tobacco: Never Used  . Alcohol Use: No  . Drug Use: No  . Sexual Activity: Not Asked   Other Topics Concern  . None   Social History Narrative   Past Surgical History  Procedure Laterality Date  . Neck surgery  07/21/02 and 01/2006  . Hernia repair  04/1998    [facility]  . Wrist surgery  1992   right   Past Medical History  Diagnosis Date  . Hyperlipidemia   . Hypertension   . DJD (degenerative joint disease)     disabilty secondary to neck issues  . Bell's palsy April 2010  . Chest pain with low risk for cardiac etiology     Low Risk Myoview Sept 2013  . Smoker    BP 135/81 mmHg  Pulse 66  Resp 14  SpO2 98%  Opioid Risk Score:   Fall Risk Score:  `1  Depression screen PHQ 2/9  Depression screen [facility] 2/9 12/03/2014 06/05/2014  Decreased Interest 2 1  Down, Depressed, Hopeless 2 0  PHQ - 2 Score 4 1  Altered sleeping - 3  Tired, decreased energy - 2  Change in appetite - 1  Feeling bad or failure about yourself  - 0  Trouble concentrating - 2  Moving slowly or fidgety/restless - 0  Suicidal thoughts - 0  PHQ-9 Score - 9     Review of Systems  Constitutional: Positive for appetite change.  Respiratory: Positive for apnea and cough.   Gastrointestinal: Positive for constipation.  Neurological: Positive for dizziness.       Spasms  All other systems reviewed and are negative.  Objective:   Physical Exam  General: Alert and oriented x 3, No apparent distress  HEENT: Head is normocephalic, atraumatic, PERRLA, EOMI, sclera anicteric, oral mucosa pink and moist, dentition intact, ext ear canals clear,  Neck: Supple without JVD or lymphadenopathy  Heart: Reg rate and rhythm. No murmurs rubs or gallops  Chest: CTA bilaterally without wheezes, rales, or rhonchi; no distress  Abdomen: Soft, non-tender, non-distended, bowel sounds positive.  Extremities: No clubbing, cyanosis, or edema. Pulses are 2+  Skin: Clean and intact without signs of breakdown  Neuro: Pt is cognitively appropriate with normal insight, memory, and awareness. Cranial nerves 2-12 are intact. Sensory exam is normal. Reflexes are 2+ in all 4's. Fine motor coordination is intact. No tremors. Motor function is grossly 5/5 in all muscles tested.  Musculoskeletal: mild tenderness at the left  inguinal line with palpable mesh just medial to the ASIS and superior to the inguinal ligament. Both knees with mild crepitus and minimal pain.. Minimal pain with lumbar ROM. leftt shoulder is somewhat tender with ER/IR. Left AC joint tender but lesser so today.Shaun Lang. Cross armed maneuver equivocal.  Psych: Pt's affect is appropriate. Pt is cooperative   Assessment & Plan:   1. Chronic left inguinal pain- appears to be related to his prior inguinal hernia (indirect) and mesh repair. This pain appears to be more related to scar tissue, muscle imbalance, posture, etc. Pain worsens with activity. I have concerns this could be hip joint as well  2. Left rotator cuff syndrome/bursitis and associated left AC joint arthritis  3. Mild CTS    Plan:  1. Information regarding AC joint arthritis and management was provided today. Will not pursue an injection at this time. Consider shoulder xr 2. Continue neurontin at 300mg  bid to tid.  3. Refilled hydrocodone today with second refill for next month. Advised him to call us if he makes any changes to his schedule.  4. Resume mobic which should help his left shoulder pain. .   5. Follow up with me in 2 months. 15 minutes of face to face patient care time were spent during this visit. All questions were encouraged and answered.

## 2015-04-08 NOTE — Patient Instructions (Signed)
PLEASE CALL ME WITH ANY PROBLEMS OR QUESTIONS (#[phone]).      Treatment of AC joint arthritis: Nonsurgical Treatment As with other arthritic conditions, initial treatment of arthritis of the shoulder is nonsurgical. Your doctor may recommend the following treatment options:  Rest or change in activities to avoid provoking pain. You may need to change the way you move your arm to do things.  Physical therapy exercises may improve the range of motion in your shoulder.  Nonsteroidal anti-inflammatory medications (NSAIDs), such as aspirin or ibuprofen, may reduce inflammation and pain. These medications can irritate the stomach lining and cause internal bleeding. They should be taken with food. Consult with your doctor before taking over-the-counter NSAIDs if you have a history of ulcers or are taking blood thinning medication.  Corticosteroid injections in the shoulder can dramatically reduce the inflammation and pain. However, the effect is often temporary.  Moist heat  Ice your shoulder for 20 to 30 minutes two or three times a day to reduce inflammation and ease pain.  If you have rheumatoid arthritis, your doctor may prescribe a disease-modifying drug, such as methotrexate.  Dietary supplements, such as glucosamine and chondroitin sulfate may help relieve pain. (Note: There is little scientific evidence to support the use of glucosamine and chondroitin sulfate to treat arthritis. In addition, the [place] Food and Drug Administration does not test dietary supplements. These compounds may cause negative interactions with other medications. Always consult your doctor before taking dietary supplements.)  Surgical Treatment Your doctor may consider surgery if your pain causes disability and is not relieved with nonsurgical options.  Arthroscopy. Cases of mild glenohumeral arthritis may be treated with arthroscopy, During arthroscopy, the surgeon inserts a small camera, called an arthroscope, into the  shoulder joint. The camera displays pictures on a television screen, and the surgeon uses these images to guide miniature surgical instruments.  Because the arthroscope and surgical instruments are thin, the surgeon can use very small incisions (cuts), rather than the larger incision needed for standard, open surgery.  During the procedure, your surgeon can debride (clean out) the inside of the joint. Although the procedure provides pain relief, it will not eliminate the arthritis from the joint. If the arthritis progresses, further surgery may be needed in the future.  Shoulder joint replacement (arthroplasty). Advanced arthritis of the glenohumeral joint can be treated with shoulder replacement surgery, in which the damaged parts of the shoulder are removed and replaced with artificial components, called a prosthesis.   (Left) A conventional total shoulder replacement (arthroplasty) mimics the normal anatomy of the shoulder. (Right) In a reverse total shoulder replacement, the plastic cup inserts on the humerus, and the metal ball screws into the shoulder socket.  Replacement surgery options include: Hemiarthroplasty. Just the head of the humerus is replaced by an artificial component.  Total shoulder arthroplasty. Both the head of the humerus and the glenoid are replaced. A plastic "cup" is fitted into the glenoid, and a metal "ball" is attached to the top of the humerus.  Reverse total shoulder arthroplasty. In a reverse total shoulder replacement, the socket and metal ball are opposite a conventional total shoulder arthroplasty. The metal ball is fixed to the glenoid and the plastic cup is fixed to the upper end of the humerus. A reverse total shoulder replacement works better for people with cuff tear arthropathy because it relies on different muscles - not the rotator cuff - to move the arm.  Resection arthroplasty. The most common surgical procedure used to  treat arthritis of the acromioclavicular  joint is a resection arthroplasty. Your surgeon may choose to do this arthroscopically.  In this procedure, a small amount of bone from the end of the collarbone is removed, leaving a space that gradually fills in with scar tissue.  Recovery. Surgical treatment of arthritis of the shoulder is generally very effective in reducing pain and restoring motion. Recovery time and rehabilitation plans depend upon the type of surgery performed.  Pain management. After surgery, you will feel some pain. This is a natural part of the healing process. Your doctor and nurses will work to reduce your pain, which can help you recover from surgery faster.  Medications are often prescribed for short-term pain relief after surgery. Many types of medicines are available to help manage pain, including opioids, non-steroidal anti-inflammatory drugs (NSAIDs), and local anesthetics. Your doctor may use a combination of these medications to improve pain relief, as well as minimize the need for opioids.  Be aware that although opioids help relieve pain after surgery, they are a narcotic and can be addictive. Opioid dependency and overdose has become a critical public health issue in the [place] It is important to use opioids only as directed by your doctor. As soon as your pain begins to improve, stop taking opioids. Talk to your doctor if your pain has not begun to improve within a few days of your surgery.  Complications. As with all surgeries, there are some risks and possible complications. Potential problems after shoulder surgery include infection, excessive bleeding, blood clots, and damage to blood vessels or nerves.  Your surgeon will discuss the possible complications with you before your operation.

## 2015-04-08 NOTE — Addendum Note (Signed)
Addended by: Doreene ElandSHUMAKER, Daizha Anand W on: 04/08/2015 03:34 PM   Modules accepted: Orders

## 2015-04-13 LAB — TOXASSURE SELECT,+ANTIDEPR,UR: PDF: 0

## 2015-04-19 NOTE — Progress Notes (Signed)
Urine drug screen for this encounter is consistent for prescribed medication 

## 2015-05-08 ENCOUNTER — Encounter: Payer: Medicare HMO | Attending: Physical Medicine & Rehabilitation | Admitting: Physical Medicine & Rehabilitation

## 2015-05-08 ENCOUNTER — Encounter: Payer: Self-pay | Admitting: Physical Medicine & Rehabilitation

## 2015-05-08 VITALS — BP 137/80 | HR 63

## 2015-05-08 DIAGNOSIS — G5792 Unspecified mononeuropathy of left lower limb: Secondary | ICD-10-CM | POA: Diagnosis not present

## 2015-05-08 DIAGNOSIS — G5790 Unspecified mononeuropathy of unspecified lower limb: Secondary | ICD-10-CM | POA: Insufficient documentation

## 2015-05-08 DIAGNOSIS — M7581 Other shoulder lesions, right shoulder: Secondary | ICD-10-CM | POA: Diagnosis present

## 2015-05-08 DIAGNOSIS — G894 Chronic pain syndrome: Secondary | ICD-10-CM | POA: Insufficient documentation

## 2015-05-08 DIAGNOSIS — M1990 Unspecified osteoarthritis, unspecified site: Secondary | ICD-10-CM

## 2015-05-08 DIAGNOSIS — R1032 Left lower quadrant pain: Secondary | ICD-10-CM

## 2015-05-08 DIAGNOSIS — M150 Primary generalized (osteo)arthritis: Secondary | ICD-10-CM | POA: Diagnosis present

## 2015-05-08 DIAGNOSIS — R1030 Lower abdominal pain, unspecified: Secondary | ICD-10-CM | POA: Insufficient documentation

## 2015-05-08 DIAGNOSIS — Z5181 Encounter for therapeutic drug level monitoring: Secondary | ICD-10-CM | POA: Diagnosis not present

## 2015-05-08 DIAGNOSIS — M19012 Primary osteoarthritis, left shoulder: Secondary | ICD-10-CM

## 2015-05-08 MED ORDER — HYDROCODONE-ACETAMINOPHEN 10-325 MG PO TABS: 1.0000 | ORAL_TABLET | Freq: Two times a day (BID) | ORAL | Status: DC | PRN

## 2015-05-08 NOTE — Patient Instructions (Signed)
REMEMBER TO USE ICE THREE X PER DAY TO YOUR LEFT SHOULDER  WORK ON SIMPLE RANGE OF MOTION WITHOUT RESISTANCE ON THE LEFT SIDE UNTIL PAIN IS LESS   AVOID SLEEPING ON YOUR LEFT SIDE   I CAN INJECT THE JOINT IF NEEDED.    PLEASE CALL ME WITH ANY PROBLEMS OR QUESTIONS (#[phone]).

## 2015-05-08 NOTE — Progress Notes (Signed)
Subjective:    Patient ID: Shaun Lang, male    DOB: [birth year], 60 y.o.   MRN: [id number]  HPI   Hakim is here in follow up of his chronic pain. He states his back is a "little" tight with the cool weather. His left inguinal area is stable. His left shoulder is still hurting, but is doing a bit better. He has not been icing the shoulder but has been stretching and doing some resistance exercises. He has not been consistent with the meloxicam. Lying on the left side irritates his back ( he likes to sleep on his left side)  He is using the vicodin for breakthrough pain.    Pain Inventory Average Pain 8 Pain Right Now 9 My pain is burning, stabbing, tingling and aching  In the last 24 hours, has pain interfered with the following? General activity 9 Relation with others 10 Enjoyment of life 10 What TIME of day is your pain at its worst? morning and evening Sleep (in general) Poor  Pain is worse with: walking, bending and some activites Pain improves with: medication Relief from Meds: 7  Mobility walk without assistance how many minutes can you walk? 20 ability to climb steps?  yes do you drive?  yes  Function employed # of hrs/week 15 disabled: date disabled .  Neuro/Psych weakness spasms dizziness  Prior Studies Any changes since last visit?  no  Physicians involved in your care Any changes since last visit?  no   Family History  Problem Relation Age of Onset  . Cancer Sister     breast   Social History   Social History  . Marital Status: Single    Spouse Name: N/A  . Number of Children: N/A  . Years of Education: N/A   Social History Main Topics  . Smoking status: Current Every Day Smoker -- 1.00 packs/day    Types: Cigarettes  . Smokeless tobacco: Never Used  . Alcohol Use: No  . Drug Use: No  . Sexual Activity: Not Asked   Other Topics Concern  . None   Social History Narrative   Past Surgical History  Procedure Laterality Date  .  Neck surgery  07/21/02 and 01/2006  . Hernia repair  04/1998    [facility]  . Wrist surgery  1992    right   Past Medical History  Diagnosis Date  . Hyperlipidemia   . Hypertension   . DJD (degenerative joint disease)     disabilty secondary to neck issues  . Bell's palsy April 2010  . Chest pain with low risk for cardiac etiology     Low Risk Myoview Sept 2013  . Smoker    BP 137/80 mmHg  Pulse 63  SpO2 100%  Opioid Risk Score:   Fall Risk Score:  `1  Depression screen PHQ 2/9  Depression screen [facility] 2/9 05/08/2015 12/03/2014 06/05/2014  Decreased Interest Down, Depressed, Hopeless 2 2 0  PHQ - 2 Score Altered sleeping - - 3  Tired, decreased energy - - 2  Change in appetite - - 1  Feeling bad or failure about yourself  - - 0  Trouble concentrating - - 2  Moving slowly or fidgety/restless - - 0  Suicidal thoughts - - 0  PHQ-9 Score - - 9     Review of Systems  Constitutional: Positive for diaphoresis.  Gastrointestinal: Positive for constipation.  All other systems reviewed and are negative.  Objective:   Physical Exam  General: Alert and oriented x 3, No apparent distress  HEENT: Head is normocephalic, atraumatic, PERRLA, EOMI, sclera anicteric, oral mucosa pink and moist, dentition intact, ext ear canals clear,  Neck: Supple without JVD or lymphadenopathy  Heart: Reg rate and rhythm. No murmurs rubs or gallops  Chest: CTA bilaterally without wheezes, rales, or rhonchi; no distress  Abdomen: Soft, non-tender, non-distended, bowel sounds positive.  Extremities: No clubbing, cyanosis, or edema. Pulses are 2+  Skin: Clean and intact without signs of breakdown  Neuro: Pt is cognitively appropriate with normal insight, memory, and awareness. Cranial nerves 2-12 are intact. Sensory exam is normal. Reflexes are 2+ in all 4's. Fine motor coordination is intact. No tremors. Motor function is grossly 5/5 in all muscles tested.  Musculoskeletal: mild tenderness  at the left inguinal line with palpable mesh just medial to the ASIS and superior to the inguinal ligament. Both knees with mild crepitus and minimal pain.. Minimal pain with lumbar ROM. leftt shoulder is somewhat tender with ER/IR. Left AC joint tender but lesser so today.Jed Limerick armed maneuver equivocal.  Psych: Pt's affect is appropriate. Pt is cooperative   Assessment & Plan:   1. Chronic left inguinal pain- appears to be related to his prior inguinal hernia (indirect) and mesh repair. This pain appears to be more related to scar tissue, muscle imbalance, posture, etc. Pain worsens with activity. I have concerns this could be hip joint as well  2. Left rotator cuff syndrome/bursitis and associated left AC joint arthritis  3. Mild CTS    Plan:  1. Needs to ice shoulder. Consider injection if need be. Encouraged ROM exercises without resistance until pain decreases. Needs to avoid sleeping on his left shoulder also  2. Continue neurontin at  bid to tid.  3. Refilled hydrocodone today with second refill for next month. Advised him to call us if he makes any changes to his schedule.  4. Needs to take mobic daily.   5. Follow up with me in 2 months. 15 minutes of face to face patient care time were spent during this visit. All questions were encouraged and answered.

## 2015-07-03 ENCOUNTER — Encounter: Payer: Medicare HMO | Attending: Physical Medicine & Rehabilitation | Admitting: Physical Medicine & Rehabilitation

## 2015-07-03 ENCOUNTER — Encounter: Payer: Self-pay | Admitting: Physical Medicine & Rehabilitation

## 2015-07-03 VITALS — BP 158/91 | HR 61

## 2015-07-03 DIAGNOSIS — G5790 Unspecified mononeuropathy of unspecified lower limb: Secondary | ICD-10-CM

## 2015-07-03 DIAGNOSIS — M75102 Unspecified rotator cuff tear or rupture of left shoulder, not specified as traumatic: Secondary | ICD-10-CM | POA: Diagnosis not present

## 2015-07-03 DIAGNOSIS — M1990 Unspecified osteoarthritis, unspecified site: Secondary | ICD-10-CM | POA: Diagnosis not present

## 2015-07-03 DIAGNOSIS — M150 Primary generalized (osteo)arthritis: Secondary | ICD-10-CM | POA: Insufficient documentation

## 2015-07-03 DIAGNOSIS — M19012 Primary osteoarthritis, left shoulder: Secondary | ICD-10-CM

## 2015-07-03 DIAGNOSIS — R1030 Lower abdominal pain, unspecified: Secondary | ICD-10-CM | POA: Diagnosis not present

## 2015-07-03 DIAGNOSIS — Z5181 Encounter for therapeutic drug level monitoring: Secondary | ICD-10-CM

## 2015-07-03 DIAGNOSIS — G894 Chronic pain syndrome: Secondary | ICD-10-CM | POA: Diagnosis present

## 2015-07-03 DIAGNOSIS — M7581 Other shoulder lesions, right shoulder: Secondary | ICD-10-CM | POA: Insufficient documentation

## 2015-07-03 DIAGNOSIS — R1032 Left lower quadrant pain: Secondary | ICD-10-CM

## 2015-07-03 MED ORDER — HYDROCODONE-ACETAMINOPHEN 10-325 MG PO TABS
1.0000 | ORAL_TABLET | Freq: Two times a day (BID) | ORAL | Status: DC | PRN
Start: 2015-07-03 — End: 2015-08-28

## 2015-07-03 MED ORDER — HYDROCODONE-ACETAMINOPHEN 10-325 MG PO TABS: 1.0000 | ORAL_TABLET | Freq: Two times a day (BID) | ORAL | Status: DC | PRN

## 2015-07-03 NOTE — Progress Notes (Signed)
Subjective:    Patient ID: Shaun Lang, male    DOB: [birth year], 60 y.o.   MRN: [id number]  HPI   Shaun Lang is here in follow up of his chronic pain. 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 He states things are fairly stable. I asked him about his "9" pain rating and after discussion he realized that it wasn't an accurate portrayal of his pain level. He states it's more like a 4-5. The pain is predominantly in his left groin. He also has pain in his left shoulder and neck. He still often sleeps on his left side.       Pain Inventory Average Pain 9 Pain Right Now 9 My pain is sharp, burning, stabbing, tingling and aching  In the last 24 hours, has pain interfered with the following? General activity 8 Relation with others 10 Enjoyment of life 9 What TIME of day is your pain at its worst? evening and night Sleep (in general) Poor  Pain is worse with: bending, standing and some activites Pain improves with: pacing activities Relief from Meds: 8  Mobility walk without assistance how many minutes can you walk? 15 ability to climb steps?  yes do you drive?  yes  Function employed # of hrs/week 15 retired  Neuro/Psych spasms  Prior Studies Any changes since last visit?  no  Physicians involved in your care Any changes since last visit?  no   Family History  Problem Relation Age of Onset  . Cancer Sister     breast   Social History   Social History  . Marital Status: Single    Spouse Name: N/A  . Number of Children: N/A  . Years of Education: N/A   Social History Main Topics  . Smoking status: Current Every Day Smoker -- 1.00 packs/day    Types: Cigarettes  . Smokeless tobacco: Never Used  . Alcohol Use: No  . Drug Use: No  . Sexual Activity: Not Asked   Other Topics Concern  . None   Social History Narrative   Past Surgical History  Procedure Laterality Date  . Neck surgery  07/21/02 and 01/2006  . Hernia repair  04/1998   [facility]  . Wrist surgery  1992    right   Past Medical History  Diagnosis Date  . Hyperlipidemia   . Hypertension   . DJD (degenerative joint disease)     disabilty secondary to neck issues  . Bell's palsy April 2010  . Chest pain with low risk for cardiac etiology     Low Risk Myoview Sept 2013  . Smoker    BP 158/91 mmHg  Pulse 61  SpO2 100%  Opioid Risk Score:   Fall Risk Score:  `1  Depression screen PHQ 2/9  [facility] 07/03/2015 05/08/2015 12/03/2014 06/05/2014  Decreased Interest 2 2 2 1   Down, Depressed, Hopeless 2 2 2  0  PHQ - 2 Score 4 4 4 1   Altered sleeping - - - 3  Tired, decreased energy - - - 2  Change in appetite - - - 1  Feeling bad or failure about yourself  - - - 0  Trouble concentrating - - - 2  Moving slowly or fidgety/restless - - - 0  Suicidal thoughts - - - 0  PHQ-9 Score - - - 9     Review of Systems  Constitutional: Positive for diaphoresis and appetite change.  Gastrointestinal: Positive for constipation.  All other systems reviewed and are negative.      Objective:   Physical Exam  General: Alert and oriented x 3, No apparent distress  HEENT: Head is normocephalic, atraumatic, PERRLA, EOMI, sclera anicteric, oral mucosa pink and moist, dentition intact, ext ear canals clear,  Neck: Supple without JVD or lymphadenopathy  Heart: Reg rate and rhythm. No murmurs rubs or gallops  Chest: CTA bilaterally without wheezes, rales, or rhonchi; no distress  Abdomen: Soft, non-tender, non-distended, bowel sounds positive.  Extremities: No clubbing, cyanosis, or edema. Pulses are 2+  Skin: Clean and intact without signs of breakdown  Neuro: Pt is cognitively appropriate with normal insight, memory, and awareness. Cranial nerves 2-12 are intact. Sensory exam is normal. Reflexes are 2+ in all 4's. Fine motor coordination is intact. No tremors. Motor function is grossly 5/5 in all muscles tested.  Musculoskeletal: mild tenderness at the left  inguinal line with palpable mesh just medial to the ASIS and superior to the inguinal ligament. Both knees with mild crepitus and minimal pain.. Minimal pain with lumbar ROM. leftt shoulder is somewhat tender with ER/IR. Left AC joint tender but lesser so today.Jed Limerick armed maneuver equivocal.  Psych: Pt's affect is appropriate. Pt is cooperative    Assessment & Plan:   1. Chronic left inguinal pain- appears to be related to his prior inguinal hernia (indirect) and mesh repair. This pain appears to be more related to scar tissue, muscle imbalance, posture, etc. Pain worsens with activity. I have concerns this could be hip joint as well  2. Left rotator cuff syndrome/bursitis and associated left AC joint arthritis  3. Mild CTS    Plan:  1. Reviewed posture with sleeping. Provided SCM stretches today 2. Continue neurontin at  bid to tid.  3. Refilled hydrocodone today with second refill for next month. Advised him to call us if he makes any changes to his schedule.  4. Advised to use mobic daily with food.  5. Follow up with NP in 2 months. 15 minutes of face to face patient care time were spent during this visit. All questions were encouraged and answered.

## 2015-08-28 ENCOUNTER — Encounter: Payer: Self-pay | Admitting: Registered Nurse

## 2015-08-28 ENCOUNTER — Encounter: Payer: Medicare HMO | Attending: Physical Medicine & Rehabilitation | Admitting: Registered Nurse

## 2015-08-28 VITALS — BP 153/90 | HR 61

## 2015-08-28 DIAGNOSIS — M150 Primary generalized (osteo)arthritis: Secondary | ICD-10-CM | POA: Diagnosis present

## 2015-08-28 DIAGNOSIS — Z5181 Encounter for therapeutic drug level monitoring: Secondary | ICD-10-CM | POA: Insufficient documentation

## 2015-08-28 DIAGNOSIS — G894 Chronic pain syndrome: Secondary | ICD-10-CM | POA: Diagnosis present

## 2015-08-28 DIAGNOSIS — R1030 Lower abdominal pain, unspecified: Secondary | ICD-10-CM | POA: Insufficient documentation

## 2015-08-28 DIAGNOSIS — M75102 Unspecified rotator cuff tear or rupture of left shoulder, not specified as traumatic: Secondary | ICD-10-CM

## 2015-08-28 DIAGNOSIS — G5790 Unspecified mononeuropathy of unspecified lower limb: Secondary | ICD-10-CM | POA: Diagnosis present

## 2015-08-28 DIAGNOSIS — M7581 Other shoulder lesions, right shoulder: Secondary | ICD-10-CM | POA: Insufficient documentation

## 2015-08-28 DIAGNOSIS — M47817 Spondylosis without myelopathy or radiculopathy, lumbosacral region: Secondary | ICD-10-CM

## 2015-08-28 DIAGNOSIS — Z79899 Other long term (current) drug therapy: Secondary | ICD-10-CM | POA: Diagnosis not present

## 2015-08-28 DIAGNOSIS — R1032 Left lower quadrant pain: Secondary | ICD-10-CM

## 2015-08-28 MED ORDER — HYDROCODONE-ACETAMINOPHEN 10-325 MG PO TABS
1.0000 | ORAL_TABLET | Freq: Two times a day (BID) | ORAL | Status: DC | PRN
Start: 2015-08-28 — End: 2015-08-28

## 2015-08-28 MED ORDER — HYDROCODONE-ACETAMINOPHEN 10-325 MG PO TABS
1.0000 | ORAL_TABLET | Freq: Two times a day (BID) | ORAL | Status: DC | PRN
Start: 2015-08-28 — End: 2015-11-05

## 2015-08-28 NOTE — Progress Notes (Signed)
Subjective:    Patient ID: Shaun RichardsCurtis L Fermin, male    DOB: Jan 16, 1956, 60 y.o.   MRN: [id number]  HPI: Mr. Shaun RichardsCurtis L Gene is a 60 year old male who returns for follow up for chronic pain and medication refill. He states his pain is located in his neck, left shoulder and lower back. He rates his pain 5. His current exercise regime performing stretching exercises and walking.   Pain Inventory Average Pain 6 Pain Right Now 5 My pain is burning, tingling and aching  In the last 24 hours, has pain interfered with the following? General activity 8 Relation with others 8 Enjoyment of life 7 What TIME of day is your pain at its worst? evening Sleep (in general) NA  Pain is worse with: bending, sitting, standing and some activites Pain improves with: NA Relief from Meds: NA  Mobility how many minutes can you walk? 5 ability to climb steps?  yes do you drive?  yes  Function retired Do you have any goals in this area?  no  Neuro/Psych spasms  Prior Studies Any changes since last visit?  no  Physicians involved in your care Any changes since last visit?  no   Family History  Problem Relation Age of Onset  . Cancer Sister     breast   Social History   Social History  . Marital Status: Single    Spouse Name: N/A  . Number of Children: N/A  . Years of Education: N/A   Social History Main Topics  . Smoking status: Current Every Day Smoker -- 1.00 packs/day    Types: Cigarettes  . Smokeless tobacco: Never Used  . Alcohol Use: No  . Drug Use: No  . Sexual Activity: Not on file   Other Topics Concern  . Not on file   Social History Narrative   Past Surgical History  Procedure Laterality Date  . Neck surgery  07/21/02 and 01/2006  . Hernia repair  04/1998    [facility]  . Wrist surgery  1992    right   Past Medical History  Diagnosis Date  . Hyperlipidemia   . Hypertension   . DJD (degenerative joint disease)     disabilty secondary to neck issues  . Bell's palsy  April 2010  . Chest pain with low risk for cardiac etiology     Low Risk Myoview Sept 2013  . Smoker    There were no vitals taken for this visit.  Opioid Risk Score:   Fall Risk Score:  `1  Depression screen PHQ 2/9  [facility] 07/03/2015 05/08/2015 12/03/2014 06/05/2014  Decreased Interest 2 2 2 1   Down, Depressed, Hopeless 2 2 2  0  PHQ - 2 Score 4 4 4 1   Altered sleeping - - - 3  Tired, decreased energy - - - 2  Change in appetite - - - 1  Feeling bad or failure about yourself  - - - 0  Trouble concentrating - - - 2  Moving slowly or fidgety/restless - - - 0  Suicidal thoughts - - - 0  PHQ-9 Score - - - 9        Review of Systems  All other systems reviewed and are negative.      Objective:   Physical Exam  Constitutional: He is oriented to person, place, and time. He appears well-developed and well-nourished.  HENT:  Head: Normocephalic and atraumatic.  Neck: Normal range of motion. Neck supple.  Cardiovascular: Normal  rate and regular rhythm.   Pulmonary/Chest: Effort normal and breath sounds normal.  Musculoskeletal:  Normal Muscle Bulk and Muscle Testing Reveals: Upper Extremities: Full ROM and Muscle Strength 5/5 Lumbar Paraspinal Tenderness: L-4- L-5 Lower Extremities: Full ROM and Muscle Strength 5/5  Arises from chair with ease Narrow based Gait   Neurological: He is alert and oriented to person, place, and time.  Skin: Skin is warm and dry.  Psychiatric: He has a normal mood and affect.  Nursing note and vitals reviewed.         Assessment & Plan:  1. Chronic left inguinal pain: S/P inguinal hernia (indirect) and mesh repair. Continue to Monitor Refilled: Hydrocodone 10/325 mg one tablet every 12 hours as needed #60. Second prescription given for following month. We will continue the opioid monitoring program, this consists of regular clinic visits, examinations, urine drug screen, pill counts as well as use of [place]  Controlled Substance Reporting system. 2. Back Pain: Continue with Exercise regime. Continue to Monitor  20 minutes of face to face patient care time was spent during this visit. All questions were encouraged and answered.   F/U in 2 months

## 2015-09-03 ENCOUNTER — Ambulatory Visit: Payer: Medicare HMO | Admitting: Registered Nurse

## 2015-09-04 LAB — TOXASSURE SELECT,+ANTIDEPR,UR: PDF: 0

## 2015-09-12 NOTE — Progress Notes (Signed)
Urine drug screen for this encounter is consistent for prescribed medication 

## 2015-10-04 ENCOUNTER — Telehealth: Payer: Self-pay | Admitting: Cardiovascular Disease

## 2015-10-09 NOTE — Telephone Encounter (Signed)
Closed encounter °

## 2015-10-22 ENCOUNTER — Ambulatory Visit (INDEPENDENT_AMBULATORY_CARE_PROVIDER_SITE_OTHER): Payer: Medicare Other | Admitting: Cardiovascular Disease

## 2015-10-22 ENCOUNTER — Encounter: Payer: Self-pay | Admitting: Cardiovascular Disease

## 2015-10-22 VITALS — BP 144/80 | HR 74 | Ht 68.0 in | Wt 188.0 lb

## 2015-10-22 DIAGNOSIS — Z79899 Other long term (current) drug therapy: Secondary | ICD-10-CM

## 2015-10-22 DIAGNOSIS — E785 Hyperlipidemia, unspecified: Secondary | ICD-10-CM

## 2015-10-22 DIAGNOSIS — F17200 Nicotine dependence, unspecified, uncomplicated: Secondary | ICD-10-CM

## 2015-10-22 DIAGNOSIS — Z720 Tobacco use: Secondary | ICD-10-CM

## 2015-10-22 NOTE — Assessment & Plan Note (Signed)
History of hypertensive blood pressure measured at 144/80. He is on amlodipine. Continue current meds at current dosing

## 2015-10-22 NOTE — Progress Notes (Signed)
10/22/2015 Shaun Lang   17-Dec-1955  [id number]  Primary Physician Jearld Lesch, MD Primary Cardiologist: Runell Gess MD Nicholes Calam[place], [place]  HPI:  The patient is a 60 year old thin appearing divorced African American male father of 4, grandfather of 5 grandchildren who I last saw 10/19/14. He was referred for chest pain. He had a negative Myoview. His pain has essentially resolved. Risk factors include continued tobacco abuse of 1 pack per day although he says he is trying to quit, hypertension, hyperlipidemia. I did start him on atorvastatin. He is resistant to risk factor modification. Since I saw him one year ago he really denies chest pain. He currently does not work and is disabled.   Current Outpatient Prescriptions  Medication Sig Dispense Refill  . amLODipine (NORVASC) 5 MG tablet Take 5 mg by mouth daily.    [name] atorvastatin (LIPITOR) 40 MG tablet Take 1 tablet (40 mg total) by mouth daily at 6 PM. 30 tablet 5  . cyclobenzaprine (FLEXERIL) 10 MG tablet Take 1 tablet (10 mg total) by mouth 2 (two) times daily as needed for muscle spasms. 60 tablet 3  . gabapentin (NEURONTIN) 300 MG capsule Take 300 mg by mouth 2 (two) times daily. Bid to tid    . HYDROcodone-acetaminophen (NORCO) 10-325 MG tablet Take 1 tablet by mouth every 12 (twelve) hours as needed. 60 tablet 0  . meloxicam (MOBIC) 15 MG tablet Take 1 tablet (15 mg total) by mouth daily. 30 tablet 4   No current facility-administered medications for this visit.     No Known Allergies  Social History   Social History  . Marital status: Single    Spouse name: N/A  . Number of children: N/A  . Years of education: N/A   Occupational History  . Not on file.   Social History Main Topics  . Smoking status: Current Every Day Smoker    Packs/day: 1.00    Types: Cigarettes  . Smokeless tobacco: Never Used  . Alcohol use No  . Drug use: No  . Sexual activity: Not on file   Other Topics Concern  . Not  on file   Social History Narrative  . No narrative on file     Review of Systems: General: negative for chills, fever, night sweats or weight changes.  Cardiovascular: negative for chest pain, dyspnea on exertion, edema, orthopnea, palpitations, paroxysmal nocturnal dyspnea or shortness of breath Dermatological: negative for rash Respiratory: negative for cough or wheezing Urologic: negative for hematuria Abdominal: negative for nausea, vomiting, diarrhea, bright red blood per rectum, melena, or hematemesis Neurologic: negative for visual changes, syncope, or dizziness All other systems reviewed and are otherwise negative except as noted above.    Blood pressure (!) 144/80, pulse 74, height 5\' 8"  (1.727 m), weight 188 lb (85.3 kg).  General appearance: alert and no distress Neck: no adenopathy, no carotid bruit, no JVD, supple, symmetrical, trachea midline and thyroid not enlarged, symmetric, no tenderness/mass/nodules Lungs: clear to auscultation bilaterally Heart: regular rate and rhythm, S1, S2 normal, no murmur, click, rub or gallop Extremities: extremities normal, atraumatic, no cyanosis or edema  EKG normal sinus at 74 evidence of LVH with repolarization changes and left axis deviation. Hypertensive urgency. She  ASSESSMENT AND PLAN:   Hypertension History of hypertensive blood pressure measured at 144/80. He is on amlodipine. Continue current meds at current dosing  Hyperlipidemia History of hyperlipidemia on statin therapy when he remembers. We will check a lipid  liver profile.  Smoker History of tobacco abuse with intention to quit      Runell Gess MD [facility], [facility] 10/22/2015 2:33 PM

## 2015-10-22 NOTE — Assessment & Plan Note (Signed)
History of tobacco abuse with intention to quit

## 2015-10-22 NOTE — Patient Instructions (Signed)
Medication Instructions:  Your physician recommends that you continue on your current medications as directed. Please refer to the Current Medication list given to you today.   Labwork: Your physician recommends that you return for lab work AT YOUR EARLIEST CONVENIENCE. You will need to be fasting. The lab can be found on the FIRST FLOOR of out building in [address]   Follow-Up: Your physician wants you to follow-up in: 12 MONTHS WITH DR BERRY. You will receive a reminder letter in the mail two months in advance. If you don't receive a letter, please call our office to schedule the follow-up appointment.  If you need a refill on your cardiac medications before your next appointment, please call your pharmacy.   

## 2015-10-22 NOTE — Assessment & Plan Note (Signed)
History of hyperlipidemia on statin therapy when he remembers. We will check a lipid liver profile.

## 2015-10-25 NOTE — Addendum Note (Signed)
Addended by: Freddi Starr on: 10/25/2015 09:01 AM   Modules accepted: Orders

## 2015-11-05 ENCOUNTER — Telehealth: Payer: Self-pay | Admitting: Physical Medicine & Rehabilitation

## 2015-11-05 ENCOUNTER — Encounter: Payer: Self-pay | Admitting: Registered Nurse

## 2015-11-05 ENCOUNTER — Encounter: Payer: Medicare Other | Attending: Physical Medicine & Rehabilitation | Admitting: Registered Nurse

## 2015-11-05 VITALS — BP 144/88 | HR 64 | Resp 16

## 2015-11-05 DIAGNOSIS — M47817 Spondylosis without myelopathy or radiculopathy, lumbosacral region: Secondary | ICD-10-CM

## 2015-11-05 DIAGNOSIS — Z5181 Encounter for therapeutic drug level monitoring: Secondary | ICD-10-CM | POA: Insufficient documentation

## 2015-11-05 DIAGNOSIS — G5790 Unspecified mononeuropathy of unspecified lower limb: Secondary | ICD-10-CM | POA: Insufficient documentation

## 2015-11-05 DIAGNOSIS — M150 Primary generalized (osteo)arthritis: Secondary | ICD-10-CM | POA: Insufficient documentation

## 2015-11-05 DIAGNOSIS — Z79899 Other long term (current) drug therapy: Secondary | ICD-10-CM

## 2015-11-05 DIAGNOSIS — R1030 Lower abdominal pain, unspecified: Secondary | ICD-10-CM | POA: Diagnosis present

## 2015-11-05 DIAGNOSIS — M7581 Other shoulder lesions, right shoulder: Secondary | ICD-10-CM | POA: Diagnosis present

## 2015-11-05 DIAGNOSIS — G894 Chronic pain syndrome: Secondary | ICD-10-CM

## 2015-11-05 DIAGNOSIS — M75102 Unspecified rotator cuff tear or rupture of left shoulder, not specified as traumatic: Secondary | ICD-10-CM

## 2015-11-05 DIAGNOSIS — R1032 Left lower quadrant pain: Secondary | ICD-10-CM

## 2015-11-05 MED ORDER — HYDROCODONE-ACETAMINOPHEN 10-325 MG PO TABS: 1.0000 | ORAL_TABLET | Freq: Two times a day (BID) | ORAL | 0 refills | Status: DC | PRN

## 2015-11-05 MED ORDER — HYDROCODONE-ACETAMINOPHEN 10-325 MG PO TABS
1.0000 | ORAL_TABLET | Freq: Two times a day (BID) | ORAL | 0 refills | Status: DC | PRN
Start: 2015-11-05 — End: 2015-11-05

## 2015-11-05 NOTE — Telephone Encounter (Signed)
April will call [name] with PCP phone mnumbers.

## 2015-11-05 NOTE — Progress Notes (Signed)
Subjective:    Patient ID: Shaun Lang, male    DOB: [birth year], 60 y.o.   MRN: [id number]  HPI: [name] is a 60 year old male who returns for follow up for chronic pain and medication refill. He states his pain is located in his neck, left shoulder and lower back. He rates his pain 7. His current exercise regime performing stretching exercises and walking.   Pain Inventory Average Pain 6 Pain Right Now 7 My pain is sharp, burning, stabbing, tingling and aching  In the last 24 hours, has pain interfered with the following? General activity 7 Relation with others 9 Enjoyment of life 8 What TIME of day is your pain at its worst? evening, night Sleep (in general) Poor  Pain is worse with: bending and some activites Pain improves with: medication Relief from Meds: 5  Mobility walk without assistance how many minutes can you walk? 5 ability to climb steps?  yes do you drive?  yes  Function retired Do you have any goals in this area?  yes  Neuro/Psych numbness spasms dizziness anxiety  Prior Studies Any changes since last visit?  no  Physicians involved in your care Any changes since last visit?  no   Family History  Problem Relation Age of Onset  . Cancer Sister     breast   Social History   Social History  . Marital status: Single    Spouse name: N/A  . Number of children: N/A  . Years of education: N/A   Social History Main Topics  . Smoking status: Current Every Day Smoker    Packs/day: 1.00    Types: Cigarettes  . Smokeless tobacco: Never Used  . Alcohol use No  . Drug use: No  . Sexual activity: Not Asked   Other Topics Concern  . None   Social History Narrative  . None   Past Surgical History:  Procedure Laterality Date  . HERNIA REPAIR  04/1998   LI[facility]  . NECK SURGERY  07/21/02 and 01/2006  . WRIST SURGERY  1992   right   Past Medical History:  Diagnosis Date  . Bell's palsy April 2010  . Chest pain with low risk for  cardiac etiology    Low Risk Myoview Sept 2013  . DJD (degenerative joint disease)    disabilty secondary to neck issues  . Hyperlipidemia   . Hypertension   . Smoker    BP (!) 144/88 (BP Location: Right Arm, Patient Position: Sitting, Cuff Size: Normal)   Pulse 64   Resp 16   SpO2 98%   Opioid Risk Score:   Fall Risk Score:  `1  Depression screen PHQ 2/9  [facility] 07/03/2015 05/08/2015 12/03/2014 06/05/2014  Decreased Interest 2 2 2 1   Down, Depressed, Hopeless 2 2 2  0  PHQ - 2 Score 4 4 4 1   Altered sleeping - - - 3  Tired, decreased energy - - - 2  Change in appetite - - - 1  Feeling bad or failure about yourself  - - - 0  Trouble concentrating - - - 2  Moving slowly or fidgety/restless - - - 0  Suicidal thoughts - - - 0  PHQ-9 Score - - - 9    Review of Systems  Constitutional: Positive for diaphoresis.  HENT: Negative.   Eyes: Negative.   Respiratory: Negative.   Gastrointestinal: Positive for constipation.  Endocrine: Negative.   Genitourinary: Negative.   Musculoskeletal:  Spasms   Allergic/Immunologic: Negative.   Neurological: Positive for dizziness and numbness.  Psychiatric/Behavioral: The patient is nervous/anxious.   All other systems reviewed and are negative.      Objective:   Physical Exam  Constitutional: He is oriented to person, place, and time. He appears well-developed and well-nourished.  HENT:  Head: Normocephalic and atraumatic.  Neck: Normal range of motion. Neck supple.  Cardiovascular: Normal rate and regular rhythm.   Pulmonary/Chest: Effort normal and breath sounds normal.  Musculoskeletal:  Normal Muscle Bulk and Muscle Testing Reveals: Upper Extremities: Full ROM and Muscle Strength 5/5 Back without spinal tenderness Lower Extremities: Full ROM and Muscle Strength 5/5 Arises from table with ease Narrow Based Gait  Neurological: He is alert and oriented to person, place, and time.  Skin: Skin is warm and  dry.  Psychiatric: He has a normal mood and affect.  Nursing note and vitals reviewed.         Assessment & Plan:  1. Chronic left inguinal pain: S/P inguinal hernia (indirect) and mesh repair. Continue to Monitor Refilled: Hydrocodone 10/325 mg one tablet every 12 hours as needed #60. Second prescription given for following month. We will continue the opioid monitoring program, this consists of regular clinic visits, examinations, urine drug screen, pill counts as well as [place] Cont[place]. 2. Back Pain: Continue with Exercise regime. Continue to Monitor  20 minutes of face to face patient care time was spent during this visit. All questions were encouraged and answered.   F/U in 2 months

## 2015-11-05 NOTE — Telephone Encounter (Signed)
Patient was just in office and was supposed to get some numbers for physicians to see.  Please call patient.

## 2015-11-07 ENCOUNTER — Other Ambulatory Visit: Payer: Self-pay | Admitting: *Deleted

## 2015-11-07 MED ORDER — ATORVASTATIN CALCIUM 40 MG PO TABS: 40.0000 mg | ORAL_TABLET | Freq: Every day | ORAL | 3 refills | Status: DC

## 2015-11-11 LAB — LIPID PANEL
Cholesterol: 170 mg/dL (ref 125–200)
HDL: 79 mg/dL (ref >=40)
LDL Cholesterol: 75 mg/dL (ref <130)
TRIGLYCERIDES: 79 mg/dL (ref <150)
Total CHOL/HDL Ratio: 2.2 Ratio (ref <=5.0)
VLDL: 16 mg/dL (ref <30)

## 2015-11-11 LAB — HEPATIC FUNCTION PANEL
ALBUMIN: 4.3 g/dL (ref 3.6–5.1)
ALK PHOS: 52 U/L (ref 40–115)
ALT: 18 U/L (ref 9–46)
AST: 17 U/L (ref 10–35)
Bilirubin, Direct: 0.1 mg/dL (ref <=0.2)
Indirect Bilirubin: 0.2 mg/dL (ref 0.2–1.2)
TOTAL PROTEIN: 6.8 g/dL (ref 6.1–8.1)
Total Bilirubin: 0.3 mg/dL (ref 0.2–1.2)

## 2015-11-12 ENCOUNTER — Encounter: Payer: Self-pay | Admitting: *Deleted

## 2015-12-19 ENCOUNTER — Encounter: Payer: Self-pay | Admitting: Family Medicine

## 2015-12-19 ENCOUNTER — Ambulatory Visit (INDEPENDENT_AMBULATORY_CARE_PROVIDER_SITE_OTHER): Payer: Medicare Other | Admitting: Family Medicine

## 2015-12-19 ENCOUNTER — Other Ambulatory Visit: Payer: Self-pay | Admitting: Family Medicine

## 2015-12-19 VITALS — BP 152/88 | HR 68 | Temp 98.5°F | Resp 14 | Ht 68.0 in | Wt 186.0 lb

## 2015-12-19 DIAGNOSIS — Z1159 Encounter for screening for other viral diseases: Secondary | ICD-10-CM

## 2015-12-19 DIAGNOSIS — M1990 Unspecified osteoarthritis, unspecified site: Secondary | ICD-10-CM

## 2015-12-19 DIAGNOSIS — Z114 Encounter for screening for human immunodeficiency virus [HIV]: Secondary | ICD-10-CM

## 2015-12-19 DIAGNOSIS — R1032 Left lower quadrant pain: Secondary | ICD-10-CM

## 2015-12-19 DIAGNOSIS — M19012 Primary osteoarthritis, left shoulder: Secondary | ICD-10-CM

## 2015-12-19 DIAGNOSIS — I10 Essential (primary) hypertension: Secondary | ICD-10-CM | POA: Diagnosis not present

## 2015-12-19 DIAGNOSIS — Z131 Encounter for screening for diabetes mellitus: Secondary | ICD-10-CM | POA: Diagnosis not present

## 2015-12-19 DIAGNOSIS — R1030 Lower abdominal pain, unspecified: Secondary | ICD-10-CM

## 2015-12-19 DIAGNOSIS — Z125 Encounter for screening for malignant neoplasm of prostate: Secondary | ICD-10-CM

## 2015-12-19 LAB — CBC WITH DIFFERENTIAL/PLATELET
BASOS PCT: 1 %
Basophils Absolute: 33 cells/uL (ref 0–200)
EOS PCT: 2 %
Eosinophils Absolute: 66 cells/uL (ref 15–500)
HCT: 39.7 % (ref 38.5–50.0)
HEMOGLOBIN: 13 g/dL — AB (ref 13.2–17.1)
LYMPHS ABS: 1386 {cells}/uL (ref 850–3900)
Lymphocytes Relative: 42 %
MCH: 28.2 pg (ref 27.0–33.0)
MCHC: 32.7 g/dL (ref 32.0–36.0)
MCV: 86.1 fL (ref 80.0–100.0)
MPV: 12 fL (ref 7.5–12.5)
Monocytes Absolute: 231 cells/uL (ref 200–950)
Monocytes Relative: 7 %
NEUTROS ABS: 1584 {cells}/uL (ref 1500–7800)
Neutrophils Relative %: 48 %
Platelets: 156 10*3/uL (ref 140–400)
RBC: 4.61 MIL/uL (ref 4.20–5.80)
RDW: 15.2 % — ABNORMAL HIGH (ref 11.0–15.0)
WBC: 3.3 10*3/uL — AB (ref 3.8–10.8)

## 2015-12-19 LAB — PSA: PSA: 0.6 ng/mL (ref <=4.0)

## 2015-12-19 MED ORDER — AMLODIPINE BESYLATE 5 MG PO TABS: 5.0000 mg | ORAL_TABLET | Freq: Every day | ORAL | 1 refills | Status: DC

## 2015-12-19 MED ORDER — MELOXICAM 15 MG PO TABS: 15.0000 mg | ORAL_TABLET | Freq: Every day | ORAL | 4 refills | Status: DC

## 2015-12-19 NOTE — Patient Instructions (Signed)
Follow-up with pain clinic as planed

## 2015-12-19 NOTE — Progress Notes (Signed)
Shaun Lang, is a 60 y.o. male  UEA:[id number]  YNW:[id number]  DOB - 04/02/1955  CC:  Chief Complaint  Patient presents with  . Establish Care  . Hypertension       HPI: Shaun Lang is a 60 y.o. male here to establish care. He has diagnoses of hypertension, hyperlipidemia, DJD with chronic pain. He is on atorvastatin 40 abd amlodipine 5 mg from [place]. He sees pain management for chronic pain. He denies any new onset of symptoms. He did not take his medications before coming today. His blood pressure is 152/88. He does want a refill of meloxicam. He follows no special diet and only walks occ. He is working on smoking cessation down to 10 a day from a pack a day.  Health maintenance: He declines immunizations today. He reports colonoscopy about 3 years ago. He needs screening for HIV, Hep C, Prostate cancer.  No Known Allergies Past Medical History:  Diagnosis Date  . Bell's palsy April 2010  . Chest pain with low risk for cardiac etiology    Low Risk Myoview Sept 2013  . DJD (degenerative joint disease)    disabilty secondary to neck issues  . Hyperlipidemia   . Hypertension   . Smoker    Current Outpatient Prescriptions on File Prior to Visit  Medication Sig Dispense Refill  . atorvastatin (LIPITOR) 40 MG tablet Take 1 tablet (40 mg total) by mouth daily at 6 PM. 90 tablet 3  . cyclobenzaprine (FLEXERIL) 10 MG tablet Take 1 tablet (10 mg total) by mouth 2 (two) times daily as needed for muscle spasms. 60 tablet 3  . gabapentin (NEURONTIN) 300 MG capsule Take 300 mg by mouth 2 (two) times daily. Bid to tid    . HYDROcodone-acetaminophen (NORCO) 10-325 MG tablet Take 1 tablet by mouth every 12 (twelve) hours as needed. 60 tablet 0   No current facility-administered medications on file prior to visit.    Family History  Problem Relation Age of Onset  . Cancer Sister     breast   Social History   Social History  . Marital status: Single    Spouse name: N/A  . Number of  children: N/A  . Years of education: N/A   Occupational History  . Not on file.   Social History Main Topics  . Smoking status: Current Every Day Smoker    Packs/day: 1.00    Types: Cigarettes  . Smokeless tobacco: Never Used  . Alcohol use Yes     Comment: rare  . Drug use: No  . Sexual activity: Not on file   Other Topics Concern  . Not on file   Social History Narrative  . No narrative on file    Review of Systems: Constitutional: Negative Skin: Negative HENT: Negative  Eyes: Negative  Neck: Negative Respiratory: Negative Cardiovascular: Negative Gastrointestinal: Negative Genitourinary: Negative  Musculoskeletal: +for shoulder, neck, lower back and hip pain Neurological: Negative for Hematological: Negative  Psychiatric/Behavioral: Negative    Objective:   Vitals:   12/19/15 0946  BP: (!) 152/88  Pulse: 68  Resp: 14  Temp: 98.5 F (36.9 C)    Physical Exam: Constitutional: Patient appears well-developed and well-nourished. No distress. HENT: Normocephalic, atraumatic, External right and left ear normal. Oropharynx is clear and moist.  Eyes: Conjunctivae and EOM are normal. PERRLA, no scleral icterus. Neck: Normal ROM. Neck supple. No lymphadenopathy, No thyromegaly. CVS: RRR, S1/S2 +, no murmurs, no gallops, no rubs Pulmonary: Effort and breath sounds normal,  no stridor, rhonchi, wheezes, rales.  Abdominal: Soft. Normoactive BS,, no distension, tenderness, rebound or guarding.  Musculoskeletal: Normal range of motion. No edema and no tenderness.  Neuro: Alert.Normal muscle tone coordination. Non-focal Skin: Skin is warm and dry. No rash noted. Not diaphoretic. No erythema. No pallor. Psychiatric: Normal mood and affect. Behavior, judgment, thought content normal.  No results found for: WBC, HGB, HCT, MCV, PLT No results found for: CREATININE, BUN, NA, K, CL, CO2  No results found for: HGBA1C Lipid Panel     Component Value Date/Time   CHOL 170  11/11/2015 0823   TRIG 79 11/11/2015 0823   HDL 79 11/11/2015 0823   CHOLHDL 2.2 11/11/2015 0823   VLDL 16 11/11/2015 0823   LDLCALC 75 11/11/2015 0823       Assessment and plan:   1. Essential hypertension  - CBC with Differential  2. Screening for diabetes mellitus  - Hemoglobin A1c  3. Screening for HIV (human immunodeficiency virus)  - HIV antibody (with reflex)  4. Need for hepatitis C screening test  - Hepatitis C Antibody  5. Arthritis of left acromioclavicular joint  - meloxicam (MOBIC) 15 MG tablet; Take 1 tablet (15 mg total) by mouth daily.  Dispense: 30 tablet; Refill: 4     Return in about 6 months (around 06/17/2016).  The patient was given clear instructions to go to ER or return to medical center if symptoms don't improve, worsen or new problems develop. The patient verbalized understanding.    Henrietta HooverLinda C Khalie Wince FNP  12/19/2015, 11:23 AM

## 2015-12-20 LAB — HEPATITIS C ANTIBODY: HCV AB: NEGATIVE

## 2015-12-20 LAB — HEMOGLOBIN A1C
Hgb A1c MFr Bld: 5.6 % (ref <5.7)
MEAN PLASMA GLUCOSE: 114 mg/dL

## 2015-12-20 LAB — HIV ANTIBODY (ROUTINE TESTING W REFLEX): HIV: NONREACTIVE

## 2016-01-02 ENCOUNTER — Encounter: Payer: Medicare Other | Attending: Physical Medicine & Rehabilitation | Admitting: Registered Nurse

## 2016-01-02 ENCOUNTER — Encounter: Payer: Self-pay | Admitting: Registered Nurse

## 2016-01-02 VITALS — BP 150/91 | HR 72

## 2016-01-02 DIAGNOSIS — M150 Primary generalized (osteo)arthritis: Secondary | ICD-10-CM | POA: Diagnosis present

## 2016-01-02 DIAGNOSIS — M47817 Spondylosis without myelopathy or radiculopathy, lumbosacral region: Secondary | ICD-10-CM | POA: Diagnosis not present

## 2016-01-02 DIAGNOSIS — G5790 Unspecified mononeuropathy of unspecified lower limb: Secondary | ICD-10-CM | POA: Diagnosis present

## 2016-01-02 DIAGNOSIS — G894 Chronic pain syndrome: Secondary | ICD-10-CM | POA: Diagnosis not present

## 2016-01-02 DIAGNOSIS — R1032 Left lower quadrant pain: Secondary | ICD-10-CM

## 2016-01-02 DIAGNOSIS — Z79899 Other long term (current) drug therapy: Secondary | ICD-10-CM

## 2016-01-02 DIAGNOSIS — M75102 Unspecified rotator cuff tear or rupture of left shoulder, not specified as traumatic: Secondary | ICD-10-CM

## 2016-01-02 DIAGNOSIS — Z5181 Encounter for therapeutic drug level monitoring: Secondary | ICD-10-CM | POA: Insufficient documentation

## 2016-01-02 DIAGNOSIS — R1030 Lower abdominal pain, unspecified: Secondary | ICD-10-CM | POA: Insufficient documentation

## 2016-01-02 DIAGNOSIS — M7581 Other shoulder lesions, right shoulder: Secondary | ICD-10-CM | POA: Diagnosis present

## 2016-01-02 MED ORDER — HYDROCODONE-ACETAMINOPHEN 10-325 MG PO TABS: 1.0000 | ORAL_TABLET | Freq: Two times a day (BID) | ORAL | 0 refills | Status: DC | PRN

## 2016-01-02 NOTE — Progress Notes (Signed)
Subjective:    Patient ID: Shaun RichardsCurtis L Guardino, male    DOB: Nov 14, 1955, 60 y.o.   MRN: [id number]  HPI: Mr. Shaun RichardsCurtis L Base is a 60 year old male who returns for follow up for chronic pain and medication refill. He states his pain is located in his right shoulder and lower back. He rates his pain 6. His current exercise regime performing stretching exercises and walking.   Pain Inventory Average Pain 6 Pain Right Now 6 My pain is burning, stabbing, tingling and aching  In the last 24 hours, has pain interfered with the following? General activity 8 Relation with others 9 Enjoyment of life 8 What TIME of day is your pain at its worst? evening, night Sleep (in general) Poor  Pain is worse with: bending, sitting and some activites Pain improves with: n/a Relief from Meds: 6  Mobility how many minutes can you walk? 5 ability to climb steps?  yes do you drive?  yes Do you have any goals in this area?  yes  Function retired Do you have any goals in this area?  yes  Neuro/Psych spasms dizziness  Prior Studies Any changes since last visit?  no  Physicians involved in your care Any changes since last visit?  no   Family History  Problem Relation Age of Onset  . Cancer Sister     breast   Social History   Social History  . Marital status: Single    Spouse name: N/A  . Number of children: N/A  . Years of education: N/A   Social History Main Topics  . Smoking status: Current Every Day Smoker    Packs/day: 1.00    Types: Cigarettes  . Smokeless tobacco: Never Used  . Alcohol use Yes     Comment: rare  . Drug use: No  . Sexual activity: Not on file   Other Topics Concern  . Not on file   Social History Narrative  . No narrative on file   Past Surgical History:  Procedure Laterality Date  . HERNIA REPAIR  04/1998   LIH  . NECK SURGERY  07/21/02 and 01/2006  . WRIST SURGERY  1992   right   Past Medical History:  Diagnosis Date  . Bell's palsy April 2010    . Chest pain with low risk for cardiac etiology    Low Risk Myoview Sept 2013  . DJD (degenerative joint disease)    disabilty secondary to neck issues  . Hyperlipidemia   . Hypertension   . Smoker    There were no vitals taken for this visit.  Opioid Risk Score:   Fall Risk Score:  `1  Depression screen PHQ 2/9  Dep[facility] screen [facility] 12/19/2015 07/03/2015 05/08/2015 12/03/2014 06/05/2014  Decreased Interest 1 2 2 2 1   Down, Depressed, Hopeless 0 2 2 2  0  PHQ - 2 Score 1 4 4 4 1   Altered sleeping - - - - 3  Tired, decreased energy - - - - 2  Change in appetite - - - - 1  Feeling bad or failure about yourself  - - - - 0  Trouble concentrating - - - - 2  Moving slowly or fidgety/restless - - - - 0  Suicidal thoughts - - - - 0  PHQ-9 Score - - - - 9     Review of Systems  Constitutional: Positive for diaphoresis.  Gastrointestinal: Positive for constipation.  Neurological: Positive for dizziness.  Spasms  All other systems reviewed and are negative.      Objective:   Physical Exam  Constitutional: He is oriented to person, place, and time. He appears well-developed and well-nourished.  HENT:  Head: Normocephalic and atraumatic.  Neck: Normal range of motion. Neck supple.  Cardiovascular: Normal rate and regular rhythm.   Pulmonary/Chest: Effort normal and breath sounds normal.  Musculoskeletal:  Normal Muscle Bulk and Muscle Testing Reveals: Upper Extremities: Full ROM and Muscle Strength 5/5 Back without spinal tenderness Lower Extremities: Full ROM and Muscle Strength 5/5 Arises from Table with ease Narrow Based Gait  Neurological: He is alert and oriented to person, place, and time.  Skin: Skin is warm and dry.  Psychiatric: He has a normal mood and affect.  Nursing note and vitals reviewed.         Assessment & Plan:  1. Chronic left inguinal pain: S/P inguinal hernia (indirect) and mesh repair. Continue to Monitor Refilled: Hydrocodone 10/325  mg one tablet every 12 hours as needed #60. Second prescription given for following month. We will continue the opioid monitoring program, this consists of regular clinic visits, examinations, urine drug screen, pill counts as well as use of [place] Controlled Substance Reporting system. 2. Back Pain: Continue with Exercise regime. Continue to Monitor  20 minutes of face to face patient care time was spent during this visit. All questions were encouraged and answered.   F/U in 2 months

## 2016-01-02 NOTE — Addendum Note (Signed)
Addended by: Osa CraverGUIJOZA, Merinda Victorino M on: 01/02/2016 09:16 AM   Modules accepted: Orders

## 2016-01-09 LAB — TOXASSURE SELECT,+ANTIDEPR,UR

## 2016-01-09 NOTE — Progress Notes (Signed)
Urine drug screen for this encounter is consistent for prescribed medication 

## 2016-02-27 ENCOUNTER — Encounter: Payer: Medicare Other | Attending: Physical Medicine & Rehabilitation | Admitting: Registered Nurse

## 2016-02-27 ENCOUNTER — Encounter: Payer: Self-pay | Admitting: Registered Nurse

## 2016-02-27 VITALS — BP 151/88 | HR 65 | Resp 14

## 2016-02-27 DIAGNOSIS — R1030 Lower abdominal pain, unspecified: Secondary | ICD-10-CM | POA: Diagnosis present

## 2016-02-27 DIAGNOSIS — Z5181 Encounter for therapeutic drug level monitoring: Secondary | ICD-10-CM | POA: Diagnosis not present

## 2016-02-27 DIAGNOSIS — G894 Chronic pain syndrome: Secondary | ICD-10-CM | POA: Diagnosis not present

## 2016-02-27 DIAGNOSIS — Z79899 Other long term (current) drug therapy: Secondary | ICD-10-CM

## 2016-02-27 DIAGNOSIS — M150 Primary generalized (osteo)arthritis: Secondary | ICD-10-CM | POA: Diagnosis present

## 2016-02-27 DIAGNOSIS — G5790 Unspecified mononeuropathy of unspecified lower limb: Secondary | ICD-10-CM | POA: Diagnosis present

## 2016-02-27 DIAGNOSIS — M75102 Unspecified rotator cuff tear or rupture of left shoulder, not specified as traumatic: Secondary | ICD-10-CM

## 2016-02-27 DIAGNOSIS — M7581 Other shoulder lesions, right shoulder: Secondary | ICD-10-CM | POA: Insufficient documentation

## 2016-02-27 DIAGNOSIS — M47817 Spondylosis without myelopathy or radiculopathy, lumbosacral region: Secondary | ICD-10-CM

## 2016-02-27 DIAGNOSIS — R1032 Left lower quadrant pain: Secondary | ICD-10-CM

## 2016-02-27 MED ORDER — HYDROCODONE-ACETAMINOPHEN 10-325 MG PO TABS: 1.0000 | ORAL_TABLET | Freq: Two times a day (BID) | ORAL | 0 refills | Status: DC | PRN

## 2016-02-27 NOTE — Progress Notes (Signed)
Subjective:    Patient ID: Shaun Lang, male    DOB: [birth year], 60 y.o.   MRN: [id number]  HPI: Shaun Lang is a 60 year old male who returns for follow up for chronic pain and medication refill. He states his pain is located in his bilateral shoulders and lower back. He rates his pain 6. His current exercise regime performing stretching exercises and walking.   Pain Inventory Average Pain 6 Pain Right Now 6 My pain is sharp, tingling and aching  In the last 24 hours, has pain interfered with the following? General activity 7 Relation with others 9 Enjoyment of life 7 What TIME of day is your pain at its worst? evening Sleep (in general) Poor  Pain is worse with: walking, bending and some activites Pain improves with: medication Relief from Meds: n/a  Mobility how many minutes can you walk? 10 ability to climb steps?  yes do you drive?  no Do you have any goals in this area?  yes  Function retired  Neuro/Psych numbness tingling spasms  Prior Studies Any changes since last visit?  no  Physicians involved in your care Any changes since last visit?  no   Family History  Problem Relation Age of Onset  . Cancer Sister     breast   Social History   Social History  . Marital status: Single    Spouse name: N/A  . Number of children: N/A  . Years of education: N/A   Social History Main Topics  . Smoking status: Current Every Day Smoker    Packs/day: 1.00    Types: Cigarettes  . Smokeless tobacco: Never Used  . Alcohol use Yes     Comment: rare  . Drug use: No  . Sexual activity: Not Asked   Other Topics Concern  . None   Social History Narrative  . None   Past Surgical History:  Procedure Laterality Date  . HERNIA REPAIR  04/1998   LIH  . NECK SURGERY  07/21/02 and 01/2006  . WRIST SURGERY  1992   right   Past Medical History:  Diagnosis Date  . Bell's palsy April 2010  . Chest pain with low risk for cardiac etiology    Low Risk  Myoview Sept 2013  . DJD (degenerative joint disease)    disabilty secondary to neck issues  . Hyperlipidemia   . Hypertension   . Smoker    BP (!) 151/88   Pulse 65   Resp 14   SpO2 98%   Opioid Risk Score:   Fall Risk Score:  `1  Depression screen PHQ 2/9  Depression screen [facility] 12/19/2015 07/03/2015 05/08/2015 12/03/2014 06/05/2014  Decreased Interest 1 2 2 2 1   Down, Depressed, Hopeless 0 2 2 2  0  PHQ - 2 Score 1 4 4 4 1   Altered sleeping - - - - 3  Tired, decreased energy - - - - 2  Change in appetite - - - - 1  Feeling bad or failure about yourself  - - - - 0  Trouble concentrating - - - - 2  Moving slowly or fidgety/restless - - - - 0  Suicidal thoughts - - - - 0  PHQ-9 Score - - - - 9     Review of Systems  Constitutional: Negative.   HENT: Negative.   Eyes: Negative.   Respiratory: Negative.   Cardiovascular: Negative.   Gastrointestinal: Negative.   Endocrine: Negative.   Genitourinary:  Negative.   Musculoskeletal: Negative.   Skin: Negative.   Allergic/Immunologic: Negative.   Neurological: Negative.   Hematological: Negative.   Psychiatric/Behavioral: Negative.   All other systems reviewed and are negative.      Objective:   Physical Exam  Constitutional: He is oriented to person, place, and time. He appears well-developed and well-nourished.  HENT:  Head: Normocephalic and atraumatic.  Neck: Normal range of motion. Neck supple.  Cardiovascular: Normal rate and regular rhythm.   Pulmonary/Chest: Effort normal and breath sounds normal.  Musculoskeletal:  Normal Muscle Bulk and Muscle Testing Reveals: Upper Extremities: Full ROM and Muscle Strength 5/5 Right AC Joint Tenderness Lower Extremities: Full ROM and Muscle Strength 5/5 Arises from chair with ease Narrow Based Gait   Neurological: He is alert and oriented to person, place, and time.  Skin: Skin is warm and dry.  Psychiatric: He has a normal mood and affect.  Nursing note and  vitals reviewed.         Assessment & Plan:  1. Chronic left inguinal pain: S/P inguinal hernia (indirect) and mesh repair. Continue to Monitor Refilled: Hydrocodone 10/325 mg one tablet every 12 hours as needed #60. Second prescription given for following month. We will continue the opioid monitoring program, this consists of regular clinic visits, examinations, urine drug screen, pill counts as well as use of [place] Cont[place] Sub[place] Reporting system. 2. Lumbosacral Spondylosis: Continue with Exercise regime. Continue to Monitor  20 minutes of face to face patient care time was spent during this visit. All questions were encouraged and answered.   F/U in 2 months

## 2016-04-16 ENCOUNTER — Ambulatory Visit (INDEPENDENT_AMBULATORY_CARE_PROVIDER_SITE_OTHER): Payer: Medicare Other | Admitting: Nurse Practitioner

## 2016-04-16 ENCOUNTER — Encounter: Payer: Self-pay | Admitting: Nurse Practitioner

## 2016-04-16 VITALS — BP 132/88 | HR 67 | Temp 98.8°F | Resp 16 | Ht 68.0 in | Wt 188.0 lb

## 2016-04-16 DIAGNOSIS — F17200 Nicotine dependence, unspecified, uncomplicated: Secondary | ICD-10-CM

## 2016-04-16 DIAGNOSIS — I10 Essential (primary) hypertension: Secondary | ICD-10-CM

## 2016-04-16 DIAGNOSIS — G4700 Insomnia, unspecified: Secondary | ICD-10-CM

## 2016-04-16 DIAGNOSIS — Z0000 Encounter for general adult medical examination without abnormal findings: Secondary | ICD-10-CM

## 2016-04-16 DIAGNOSIS — Z716 Tobacco abuse counseling: Secondary | ICD-10-CM

## 2016-04-16 DIAGNOSIS — E785 Hyperlipidemia, unspecified: Secondary | ICD-10-CM

## 2016-04-16 MED ORDER — SUVOREXANT 10 MG PO TABS: 1.0000 | ORAL_TABLET | Freq: Every evening | ORAL | Status: DC | PRN

## 2016-04-16 MED ORDER — NICOTINE 21 MG/24HR TD PT24: 21.0000 mg | MEDICATED_PATCH | Freq: Every day | TRANSDERMAL | 0 refills | Status: DC

## 2016-04-16 NOTE — Progress Notes (Signed)
Pre visit review using our clinic review tool, if applicable. No additional management support is needed unless otherwise documented below in the visit note. 

## 2016-04-16 NOTE — Patient Instructions (Signed)
Sign medical release to get colonoscopy report from [facility].  Call office if you want prescription for Belsomra.

## 2016-04-16 NOTE — Progress Notes (Signed)
Subjective:    Patient ID: Shaun Lang, male    DOB: June 11, 1955, 61 y.o.   MRN: [id number]  Patient presents today for establish care (new patient)  Insomnia  Primary symptoms: fragmented sleep, no sleep disturbance, difficulty falling asleep, no somnolence, frequent awakening, no malaise/fatigue, no napping.  The current episode started more than one year. The onset quality is gradual. The problem occurs nightly. The problem is unchanged. The symptoms are aggravated by tobacco. How many beverages per day that contain caffeine: 0 - 1.  Types of beverages you drink: coffee. Past treatments include alcohol. The treatment provided no relief. Typical bedtime:  10-11 P.M..  How long after going to bed to you fall asleep: other (3-4hours).   Duration of naps:  One to two hours.  PMH includes: hypertension, no depression, no family stress or anxiety, restless leg syndrome, no work related stressors, chronic pain, no apnea. Prior diagnostic workup includes:  No prior workup.  Nicotine Dependence  Presents for initial visit. Symptoms include cravings, insomnia and irritability. Symptoms are negative for sore throat. Preferred tobacco types include cigarettes. Preferred cigarette types include filtered. Preferred strength is regular. Preferred cigarettes are non-menthol. His urge triggers include company of smokers and stress. Risk factors do not include drinking alcohol or drinking coffee.His first smoke is from 6 to 8 AM. He smokes 1 pack of cigarettes per day. He started smoking when he was >61 years old. Past treatments include nicotine lozenge and nicotine gum. The treatment provided no relief. Compliance with prior treatments has been good. Shaun Lang is ready to quit. Shaun Lang has tried to quit 0 times. There is no history of alcohol abuse and drug use. (Snores at night)   He has been to several providers in last 1year looking for pcp.  Chronic pain: Managed by Dr. Riley KillSwartz.  Immunizations: (TDAP, Hep C  screen, Pneumovax, Influenza, zoster)  Health Maintenance  Topic Date Due  . Colon Cancer Screening  05/19/2005  . Flu Shot  06/21/2016*  . Shingles Vaccine  03/23/2017*  . Tetanus Vaccine  11/21/2024  .  Hepatitis C: One time screening is recommended by Center for Disease Control  (CDC) for  adults born from [birth year] through 1965.   Completed  . HIV Screening  Completed  *Topic was postponed. The date shown is not the original due date.   Diet:regular Weight:  Wt Readings from Last 3 Encounters:  04/16/16 188 lb (85.3 kg)  12/19/15 186 lb (84.4 kg)  10/22/15 188 lb (85.3 kg)   Exercise: walking Fall Risk: tripped on side walk due to improper lens Fall Risk  02/27/2016 01/02/2016 12/19/2015 11/05/2015 07/03/2015 05/08/2015 04/08/2015  Falls in the past year? No Yes No Yes Yes Yes Yes  Number falls in past yr: - 1 - 1 1 1 1   Injury with Fall? - No - No - No No  Risk for fall due to : - - - Medication side effect - Medication side effect -  Follow up - - - Education provided;Falls prevention discussed Education provided;Falls prevention discussed Education provided;Falls prevention discussed -   Home Safety:home alone Depression/Suicide: denies any depression [facility] screen [facility] 2/9 12/19/2015 07/03/2015 05/08/2015 12/03/2014 06/05/2014  Decreased Interest 1 2 2 2 1   Down, Depressed, Hopeless 0 2 2 2  0  PHQ - 2 Score 1 4 4 4 1   Altered sleeping - - - - 3  Tired, decreased energy - - - - 2  Change in appetite - - - - 1  Feeling bad or failure about yourself  - - - - 0  Trouble concentrating - - - - 2  Moving slowly or fidgety/restless - - - - 0  Suicidal thoughts - - - - 0  PHQ-9 Score - - - - 9   No flowsheet data found. Colonoscopy (every 5-55yrs, >50-86yrs):last done 80yrs ago, colon polyp removed, repeat in 58yrs, done by [facility]. PSA (yearly, >39yrs):up to date Vision:up to date cataracts Dental:up to date Advanced Directive: Advanced Directives 02/27/2016  Does Patient  Have a Medical Advance Directive? No  Would patient like information on creating a medical advance directive? -   Sexual History (birth control, marital status, STD):single, 4adults children, 5grand children  Medications and allergies reviewed with patient and updated if appropriate.  Patient Active Problem List   Diagnosis Date Noted  . Rotator cuff syndrome of left shoulder 01/30/2015  . Arthritis of left acromioclavicular joint 01/30/2015  . Knee pain, bilateral 06/05/2014  . Right rotator cuff tendinitis 07/07/2013  . Chest pain with low risk for cardiac etiology   . Hypertension   . Hyperlipidemia   . DJD (degenerative joint disease)   . Smoker   . Ilioinguinal neuralgia 06/01/2012  . Left inguinal pain 12/23/2011  . Bell's palsy 06/21/2008    Current Outpatient Prescriptions on File Prior to Visit  Medication Sig Dispense Refill  . amLODipine (NORVASC) 5 MG tablet Take 1 tablet (5 mg total) by mouth daily. 90 tablet 1  . atorvastatin (LIPITOR) 40 MG tablet Take 1 tablet (40 mg total) by mouth daily at 6 PM. 90 tablet 3  . cyclobenzaprine (FLEXERIL) 10 MG tablet Take 1 tablet (10 mg total) by mouth 2 (two) times daily as needed for muscle spasms. 60 tablet 3  . gabapentin (NEURONTIN) 300 MG capsule Take 300 mg by mouth 2 (two) times daily. Bid to tid    . HYDROcodone-acetaminophen (NORCO) 10-325 MG tablet Take 1 tablet by mouth every 12 (twelve) hours as needed. 60 tablet 0  . meloxicam (MOBIC) 15 MG tablet Take 1 tablet (15 mg total) by mouth daily. 30 tablet 4   No current facility-administered medications on file prior to visit.     Past Medical History:  Diagnosis Date  . Bell's palsy April 2010  . Chest pain with low risk for cardiac etiology    Low Risk Myoview Sept 2013  . DJD (degenerative joint disease)    disabilty secondary to neck issues  . Hyperlipidemia   . Hypertension   . Smoker     Past Surgical History:  Procedure Laterality Date  . HERNIA  REPAIR  04/1998   LIH  . NECK SURGERY  07/21/02 and 01/2006  . WRIST SURGERY  1992   right    Social History   Social History  . Marital status: Single    Spouse name: N/A  . Number of children: N/A  . Years of education: N/A   Social History Main Topics  . Smoking status: Current Every Day Smoker    Packs/day: 1.00    Types: Cigarettes  . Smokeless tobacco: Never Used  . Alcohol use Yes     Comment: rare  . Drug use: No  . Sexual activity: Not on file   Other Topics Concern  . Not on file   Social History Narrative  . No narrative on file    Family History  Problem Relation Age of Onset  . Diabetes Mother   . Cancer Sister  breast  . Diabetes Sister   . Stroke Sister 40        Review of Systems  Constitutional: Positive for irritability. Negative for fever, malaise/fatigue and weight loss.  HENT: Negative for congestion and sore throat.   Eyes:       Negative for visual changes  Respiratory: Negative for apnea, cough and shortness of breath.   Cardiovascular: Negative for chest pain, palpitations and leg swelling.  Gastrointestinal: Negative for blood in stool, constipation, diarrhea and heartburn.  Genitourinary: Negative for dysuria, frequency and urgency.  Musculoskeletal: Negative for falls, joint pain and myalgias.  Skin: Negative for rash.  Neurological: Negative for dizziness, sensory change and headaches.  Endo/Heme/Allergies: Does not bruise/bleed easily.  Psychiatric/Behavioral: Negative for depression, sleep disturbance, substance abuse and suicidal ideas. The patient has insomnia. The patient is not nervous/anxious.     Objective:   Vitals:   04/16/16 1458  BP: 132/88  Pulse: 67  Resp: 16  Temp: 98.8 F (37.1 C)    Body mass index is 28.59 kg/m.   Physical Examination:  Physical Exam  Constitutional: He is oriented to person, place, and time and well-developed, well-nourished, and in no distress. No distress.  HENT:  Right  Ear: External ear normal.  Left Ear: External ear normal.  Nose: Nose normal.  Mouth/Throat: Oropharynx is clear and moist. No oropharyngeal exudate.  Eyes: Conjunctivae and EOM are normal. Pupils are equal, round, and reactive to light. No scleral icterus.  Neck: Normal range of motion. Neck supple. No thyromegaly present.  Cardiovascular: Normal rate, normal heart sounds and intact distal pulses.   Pulmonary/Chest: Effort normal and breath sounds normal. He exhibits no tenderness.  Abdominal: Soft. Bowel sounds are normal. He exhibits no distension. There is no tenderness.  Musculoskeletal: Normal range of motion. He exhibits no edema.  Lymphadenopathy:    He has no cervical adenopathy.  Neurological: He is alert and oriented to person, place, and time. Gait normal.  Skin: Skin is warm and dry.  Psychiatric: Affect and judgment normal.    ASSESSMENT and PLAN:  Diagnoses and all orders for this visit:  Encounter for medical examination to establish care  Tobacco dependence -     nicotine (NICODERM CQ - DOSED IN MG/24 HOURS) 21 mg/24hr patch; Place 1 patch (21 mg total) onto the skin daily.  Encounter for tobacco use cessation counseling -     nicotine (NICODERM CQ - DOSED IN MG/24 HOURS) 21 mg/24hr patch; Place 1 patch (21 mg total) onto the skin daily.  Insomnia, unspecified type -     Suvorexant (BELSOMRA) 10 MG TABS; Take 1 tablet by mouth at bedtime as needed.  Essential hypertension  Hyperlipidemia, unspecified hyperlipidemia type   No problem-specific Assessment & Plan notes found for this encounter.     Follow up: Return in about 6 months (around 10/14/2016) for hyperlipidemia and HTN (fasting), tobacco use.  Alysia Penna, NP

## 2016-04-29 ENCOUNTER — Encounter: Payer: Self-pay | Admitting: Physical Medicine & Rehabilitation

## 2016-04-29 ENCOUNTER — Encounter: Payer: Medicare Other | Attending: Physical Medicine & Rehabilitation | Admitting: Physical Medicine & Rehabilitation

## 2016-04-29 VITALS — BP 162/95 | HR 68

## 2016-04-29 DIAGNOSIS — Z79899 Other long term (current) drug therapy: Secondary | ICD-10-CM

## 2016-04-29 DIAGNOSIS — G5790 Unspecified mononeuropathy of unspecified lower limb: Secondary | ICD-10-CM | POA: Diagnosis present

## 2016-04-29 DIAGNOSIS — R1030 Lower abdominal pain, unspecified: Secondary | ICD-10-CM | POA: Insufficient documentation

## 2016-04-29 DIAGNOSIS — G5792 Unspecified mononeuropathy of left lower limb: Secondary | ICD-10-CM

## 2016-04-29 DIAGNOSIS — M75102 Unspecified rotator cuff tear or rupture of left shoulder, not specified as traumatic: Secondary | ICD-10-CM | POA: Diagnosis not present

## 2016-04-29 DIAGNOSIS — R1032 Left lower quadrant pain: Secondary | ICD-10-CM | POA: Diagnosis not present

## 2016-04-29 DIAGNOSIS — G894 Chronic pain syndrome: Secondary | ICD-10-CM | POA: Diagnosis present

## 2016-04-29 DIAGNOSIS — Z5181 Encounter for therapeutic drug level monitoring: Secondary | ICD-10-CM

## 2016-04-29 DIAGNOSIS — M7581 Other shoulder lesions, right shoulder: Secondary | ICD-10-CM | POA: Diagnosis present

## 2016-04-29 DIAGNOSIS — M150 Primary generalized (osteo)arthritis: Secondary | ICD-10-CM | POA: Insufficient documentation

## 2016-04-29 MED ORDER — HYDROCODONE-ACETAMINOPHEN 10-325 MG PO TABS: 1.0000 | ORAL_TABLET | Freq: Two times a day (BID) | ORAL | 0 refills | Status: DC | PRN

## 2016-04-29 NOTE — Patient Instructions (Addendum)
ESTABLISH BETTER SLEEPING HABITS  YOU NEED TO MORE ACTIVE DURING THE DAY. THIS WILL ALSO HELP YOUR SLEEPING HABITS  MELATONIN FOR SLEEP 3-8MG  AT NIGHT.   DON'T USE THE HYDROCODONE JUST BECAUSE YOU HAVE, USE IT BECAUSE YOU NEED IT   PLEASE FEEL FREE TO CALL OUR OFFICE WITH ANY PROBLEMS OR QUESTIONS [phone])

## 2016-04-29 NOTE — Progress Notes (Signed)
Subjective:    Patient ID: Shaun Lang, male    DOB: [birth year], 61 y.o.   MRN: [id number]  HPI   [name] is here in follow up his chronic pain. He is exercising 3x per week at the [facility]. He unfortunately doesn't do a lot otherwise. He's not stretching. His sleep habits are poor, often going to bed at 6 or 7pm because he has "nothing better to do.". He was given belsomra samples by his primary  He is using the meloxicam and hydrocodone for pain control. He seems to be using less hydrocodone in general. He still uses it almost every day. Does report some constipation. No GI upset.   Otherwise he reports no new issues. Denies any medical changes.    Pain Inventory Average Pain 6 Pain Right Now 7 My pain is burning and dull  In the last 24 hours, has pain interfered with the following? General activity 7 Relation with others 9 Enjoyment of life 8 What TIME of day is your pain at its worst? night Sleep (in general) Poor  Pain is worse with: unsure Pain improves with: medication Relief from Meds: 9  Mobility walk without assistance ability to climb steps?  yes do you drive?  no  Function disabled: date disabled 2013  Neuro/Psych spasms  Prior Studies Any changes since last visit?  no  Physicians involved in your care Any changes since last visit?  no   Family History  Problem Relation Age of Onset  . Diabetes Mother   . Cancer Sister     breast  . Diabetes Sister   . Stroke Sister 16   Social History   Social History  . Marital status: Single    Spouse name: N/A  . Number of children: N/A  . Years of education: N/A   Social History Main Topics  . Smoking status: Current Every Day Smoker    Packs/day: 1.00    Types: Cigarettes  . Smokeless tobacco: Never Used  . Alcohol use Yes     Comment: rare  . Drug use: No  . Sexual activity: Not on file   Other Topics Concern  . Not on file   Social History Narrative  . No narrative on file   Past  Surgical History:  Procedure Laterality Date  . HERNIA REPAIR  04/1998   LIH  . NECK SURGERY  07/21/02 and 01/2006  . WRIST SURGERY  1992   right   Past Medical History:  Diagnosis Date  . Bell's palsy April 2010  . Chest pain with low risk for cardiac etiology    Low Risk Myoview Sept 2013  . DJD (degenerative joint disease)    disabilty secondary to neck issues  . Hyperlipidemia   . Hypertension   . Smoker    There were no vitals taken for this visit.  Opioid Risk Score:   Fall Risk Score:  `1  Depression screen PHQ 2/9  Depression screen [facility] 2/9 12/19/2015 07/03/2015 05/08/2015 12/03/2014 06/05/2014  Decreased Interest 1 2 2 2 1   Down, Depressed, Hopeless 0 2 2 2  0  PHQ - 2 Score 1 4 4 4 1   Altered sleeping - - - - 3  Tired, decreased energy - - - - 2  Change in appetite - - - - 1  Feeling bad or failure about yourself  - - - - 0  Trouble concentrating - - - - 2  Moving slowly or fidgety/restless - - - - 0  Suicidal thoughts - - - - 0  PHQ-9 Score - - - - 9   Review of Systems  Constitutional: Negative.   HENT: Negative.   Eyes: Negative.   Respiratory: Negative.   Cardiovascular: Negative.   Gastrointestinal: Negative.   Endocrine: Negative.   Genitourinary: Negative.   Musculoskeletal: Negative.   Skin: Negative.   Allergic/Immunologic: Negative.   Neurological: Negative.   Hematological: Negative.   Psychiatric/Behavioral: Negative.   All other systems reviewed and are negative.      Objective:   Physical Exam General: Alert and oriented x 3, No apparent distress  HEENT: PERRL Neck: Supple without JVD or lymphadenopathy  Heart: RRR  Chest: CTA  Abdomen: NT/ND.  Extremities: No clubbing, cyanosis, or edema. Pulses are 2+  Skin: Clean and intact without signs of breakdown  Neuro: Pt is cognitively appropriate with normal insight, memory, and awareness. Cranial nerves 2-12 are intact. Sensory exam is normal. Reflexes are 2+ in all 4's. Fine motor  coordination is intact. No tremors. Motor function is grossly 5/5 in all muscles tested.  Musculoskeletal: continued mild tenderness at the left inguinal line with mesh remains palpable just medial to the ASIS and superior to the inguinal ligament. Minimal knee pain. Minimal pain with lumbar ROM. leftt shoulder is less tender with ER/IR. Left AC joint tender to palp and cross arm maneuver Psych: Pt's affect is appropriate. Pt is cooperative    Assessment & Plan:   1. Chronic left inguinal pain- appears to be related to his prior inguinal hernia (indirect) and mesh repair. This pain appears to be more related to scar tissue, muscle imbalance, posture, etc. Pain worsens with activity. I have concerns this could be hip joint as well  2. Left rotator cuff syndrome/bursitis and associated left AC joint arthritis  3. Mild CTS  4. Insomnia   Plan:  1. Needs to be regular with stretching and exercise. This will help with his pain as well as his sleep habits as well.  2. Continue neurontin at 300mg  bid to tid.  3. Refilled hydrocodone today #60. Told him I expect this to last 2 months. He should be using this when he needs it not just because he can.  We will continue the opioid monitoring program, this consists of regular clinic visits, examinations, urine drug screen, pill counts as well as use of [place] Controlled Substance Reporting System. 4. Advised to use mobic daily with food.  5. Insomnia---need to have better sleep habits  -melatonin trial  -belsomra won't be covered by insurance  6. Follow up with NP in 2 months. 15 minutes of face to face patient care time were spent during this visit. All questions were encouraged and answered. Greater than 50% of time during this encounter was spent counseling patient/family in regard to pain mgt, sleep habits.

## 2016-04-29 NOTE — Addendum Note (Signed)
Addended by: Jerry CarasHAYNES, Kirstie Larsen I on: 04/29/2016 10:20 AM   Modules accepted: Orders

## 2016-05-05 LAB — TOXASSURE SELECT,+ANTIDEPR,UR

## 2016-05-08 NOTE — Progress Notes (Signed)
Urine drug screen for this encounter is consistent for prescribed medication 

## 2016-06-01 ENCOUNTER — Telehealth: Payer: Self-pay | Admitting: Physical Medicine & Rehabilitation

## 2016-06-01 ENCOUNTER — Other Ambulatory Visit: Payer: Self-pay | Admitting: Physical Medicine & Rehabilitation

## 2016-06-01 NOTE — Telephone Encounter (Signed)
Patient called back and states wants a refill

## 2016-06-02 NOTE — Telephone Encounter (Signed)
Dr Riley KillSwartz note says specifically says must last 2 months.  Mr Shaun Lang says he hurt his back when he fell down his steps. I explained to him that he must call and report and ask to take more medication, he can not escalate his medication on his own.  We can not give a refill on his hydrocodone.  We will call him back to move his 06/26/16 appt with Riley LamEunice up a little earlier.

## 2016-06-03 NOTE — Telephone Encounter (Signed)
error 

## 2016-06-17 ENCOUNTER — Encounter: Payer: Medicare Other | Admitting: Family Medicine

## 2016-06-17 NOTE — Progress Notes (Signed)
This encounter was created in error - please disregard.

## 2016-06-26 ENCOUNTER — Encounter: Payer: Self-pay | Admitting: Registered Nurse

## 2016-06-26 ENCOUNTER — Encounter: Payer: Medicare Other | Attending: Physical Medicine & Rehabilitation | Admitting: Registered Nurse

## 2016-06-26 VITALS — BP 162/99 | HR 66

## 2016-06-26 DIAGNOSIS — M47817 Spondylosis without myelopathy or radiculopathy, lumbosacral region: Secondary | ICD-10-CM | POA: Diagnosis not present

## 2016-06-26 DIAGNOSIS — G5792 Unspecified mononeuropathy of left lower limb: Secondary | ICD-10-CM | POA: Diagnosis not present

## 2016-06-26 DIAGNOSIS — Z5181 Encounter for therapeutic drug level monitoring: Secondary | ICD-10-CM | POA: Diagnosis not present

## 2016-06-26 DIAGNOSIS — R1032 Left lower quadrant pain: Secondary | ICD-10-CM | POA: Diagnosis not present

## 2016-06-26 DIAGNOSIS — M7581 Other shoulder lesions, right shoulder: Secondary | ICD-10-CM | POA: Insufficient documentation

## 2016-06-26 DIAGNOSIS — M62830 Muscle spasm of back: Secondary | ICD-10-CM

## 2016-06-26 DIAGNOSIS — M150 Primary generalized (osteo)arthritis: Secondary | ICD-10-CM | POA: Diagnosis present

## 2016-06-26 DIAGNOSIS — G894 Chronic pain syndrome: Secondary | ICD-10-CM | POA: Insufficient documentation

## 2016-06-26 DIAGNOSIS — G5790 Unspecified mononeuropathy of unspecified lower limb: Secondary | ICD-10-CM | POA: Insufficient documentation

## 2016-06-26 DIAGNOSIS — R1030 Lower abdominal pain, unspecified: Secondary | ICD-10-CM | POA: Insufficient documentation

## 2016-06-26 DIAGNOSIS — Z79899 Other long term (current) drug therapy: Secondary | ICD-10-CM

## 2016-06-26 MED ORDER — CYCLOBENZAPRINE HCL 10 MG PO TABS: 10.0000 mg | ORAL_TABLET | Freq: Two times a day (BID) | ORAL | 3 refills | Status: DC | PRN

## 2016-06-26 MED ORDER — HYDROCODONE-ACETAMINOPHEN 10-325 MG PO TABS: 1.0000 | ORAL_TABLET | Freq: Two times a day (BID) | ORAL | 0 refills | Status: DC | PRN

## 2016-06-26 NOTE — Progress Notes (Signed)
Subjective:    Patient ID: Shaun Lang, male    DOB: 14-Aug-1955, 61 y.o.   MRN: [id number]  HPI: [name] is a 61 year old male who returns for follow up appointment for chronic pain and medication refill. He states his pain is located in his lower back. He rates his pain 8. His current exercise regime performing stretching exercises and walking.  Spoke with Shaun Lang regarding his Hydrocodone, this prescription must last two months, he verbalizes understanding.   Pain Inventory Average Pain 8 Pain Right Now 8 My pain is sharp, burning, stabbing, tingling and aching  In the last 24 hours, has pain interfered with the following? General activity 8 Relation with others 9 Enjoyment of life 8 What TIME of day is your pain at its worst? day and night Sleep (in general) Poor  Pain is worse with: walking, bending, standing and some activites Pain improves with: medication Relief from Meds: 6  Mobility walk without assistance ability to climb steps?  yes do you drive?  no  Function disabled: date disabled .  Neuro/Psych tingling spasms confusion  Prior Studies Any changes since last visit?  no  Physicians involved in your care Any changes since last visit?  no   Family History  Problem Relation Age of Onset  . Diabetes Mother   . Cancer Sister     breast  . Diabetes Sister   . Stroke Sister 50   Social History   Social History  . Marital status: Single    Spouse name: N/A  . Number of children: N/A  . Years of education: N/A   Social History Main Topics  . Smoking status: Current Every Day Smoker    Packs/day: 1.00    Types: Cigarettes  . Smokeless tobacco: Never Used  . Alcohol use Yes     Comment: rare  . Drug use: No  . Sexual activity: Not Asked   Other Topics Concern  . None   Social History Narrative  . None   Past Surgical History:  Procedure Laterality Date  . HERNIA REPAIR  04/1998   LIH  . NECK SURGERY  07/21/02 and 01/2006   . WRIST SURGERY  1992   right   Past Medical History:  Diagnosis Date  . Bell's palsy April 2010  . Chest pain with low risk for cardiac etiology    Low Risk Myoview Sept 2013  . DJD (degenerative joint disease)    disabilty secondary to neck issues  . Hyperlipidemia   . Hypertension   . Smoker    There were no vitals taken for this visit.  Opioid Risk Score:   Fall Risk Score:  `1  Depression screen PHQ 2/9  Depression screen [facility] 2/9 12/19/2015 07/03/2015 05/08/2015 12/03/2014 06/05/2014  Decreased Interest Down, Depressed, Hopeless 0 0  PHQ - 2 Score Altered sleeping - - - - 3  Tired, decreased energy - - - - 2  Change in appetite - - - - 1  Feeling bad or failure about yourself  - - - - 0  Trouble concentrating - - - - 2  Moving slowly or fidgety/restless - - - - 0  Suicidal thoughts - - - - 0  PHQ-9 Score - - - - 9    Review of Systems  Constitutional: Positive for appetite change and diaphoresis.  HENT: Negative.   Eyes: Negative.  Respiratory: Negative.   Cardiovascular: Negative.   Gastrointestinal: Negative.   Endocrine: Negative.   Genitourinary: Negative.   Musculoskeletal: Negative.   Skin: Negative.   Allergic/Immunologic: Negative.   Neurological: Negative.   Hematological: Negative.   Psychiatric/Behavioral: Negative.   All other systems reviewed and are negative.      Objective:   Physical Exam  Constitutional: He is oriented to person, place, and time. He appears well-developed and well-nourished.  HENT:  Head: Normocephalic and atraumatic.  Neck: Normal range of motion. Neck supple.  Cardiovascular: Normal rate and regular rhythm.   Pulmonary/Chest: Effort normal and breath sounds normal.  Musculoskeletal:  Normal Muscle Bulk and Muscle Testing Reveals: Upper Extremities: Full ROM and Muscle Strength 5/5 Thoracic Paraspinal Tenderness: T-7-T-9 T- 11-T-12 Lumbar Hypersensitivity Lower Extremities: Full ROM  and Muscle Strength 5/5 Arises from chair with ease Narrow Based Gait    Neurological: He is alert and oriented to person, place, and time.  Skin: Skin is warm and dry.  Psychiatric: He has a normal mood and affect.  Nursing note and vitals reviewed.         Assessment & Plan:  1. Chronic left inguinal pain: S/P inguinal hernia (indirect) and mesh repair. Continue to Monitor. 06/26/2016 Refilled: Hydrocodone 10/325 mg one tablet every 12 hours as needed #60. This prescription must  We will continue the opioid monitoring program, this consists of regular clinic visits, examinations, urine drug screen, pill counts as well as use of [place] Controlled Substance Reporting system. 2. Lumbosacral Spondylosis: Continue with Exercise regime. Continue to Monitor.  15 minutes of face to face patient care time was spent during this visit. All questions were encouraged and answered.    F/U in 2 months

## 2016-07-07 ENCOUNTER — Emergency Department (HOSPITAL_COMMUNITY)
Admission: EM | Admit: 2016-07-07 | Discharge: 2016-07-07 | Disposition: A | Discharge status: Home / Self Care | Payer: Medicare Other | Attending: Emergency Medicine | Admitting: Emergency Medicine

## 2016-07-07 ENCOUNTER — Emergency Department (HOSPITAL_COMMUNITY): Payer: Medicare Other

## 2016-07-07 ENCOUNTER — Encounter (HOSPITAL_COMMUNITY): Payer: Self-pay | Admitting: Emergency Medicine

## 2016-07-07 DIAGNOSIS — R51 Headache: Secondary | ICD-10-CM | POA: Diagnosis not present

## 2016-07-07 DIAGNOSIS — F17210 Nicotine dependence, cigarettes, uncomplicated: Secondary | ICD-10-CM | POA: Insufficient documentation

## 2016-07-07 DIAGNOSIS — Y999 Unspecified external cause status: Secondary | ICD-10-CM | POA: Insufficient documentation

## 2016-07-07 DIAGNOSIS — G8929 Other chronic pain: Secondary | ICD-10-CM | POA: Insufficient documentation

## 2016-07-07 DIAGNOSIS — M542 Cervicalgia: Secondary | ICD-10-CM | POA: Diagnosis not present

## 2016-07-07 DIAGNOSIS — Z79899 Other long term (current) drug therapy: Secondary | ICD-10-CM | POA: Diagnosis not present

## 2016-07-07 DIAGNOSIS — M545 Low back pain: Secondary | ICD-10-CM | POA: Insufficient documentation

## 2016-07-07 DIAGNOSIS — I10 Essential (primary) hypertension: Secondary | ICD-10-CM | POA: Diagnosis not present

## 2016-07-07 DIAGNOSIS — Y92410 Unspecified street and highway as the place of occurrence of the external cause: Secondary | ICD-10-CM | POA: Diagnosis not present

## 2016-07-07 DIAGNOSIS — Y939 Activity, unspecified: Secondary | ICD-10-CM | POA: Insufficient documentation

## 2016-07-07 MED ORDER — OXYCODONE-ACETAMINOPHEN 5-325 MG PO TABS: 1.0000 | ORAL_TABLET | ORAL | 0 refills | Status: DC | PRN

## 2016-07-07 MED ORDER — OXYCODONE-ACETAMINOPHEN 5-325 MG PO TABS
1.0000 | ORAL_TABLET | Freq: Once | ORAL | Status: AC
  Administered 2016-07-07: 1 via ORAL
  Filled 2016-07-07: qty 1

## 2016-07-07 MED ORDER — CYCLOBENZAPRINE HCL 10 MG PO TABS
10.0000 mg | ORAL_TABLET | Freq: Once | ORAL | Status: AC
  Administered 2016-07-07: 10 mg via ORAL
  Filled 2016-07-07: qty 1
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
## 2016-07-07 NOTE — ED Notes (Signed)
Patient transported to CT 

## 2016-08-21 ENCOUNTER — Encounter: Payer: Self-pay | Admitting: Registered Nurse

## 2016-08-21 ENCOUNTER — Encounter: Payer: Medicare Other | Attending: Physical Medicine & Rehabilitation | Admitting: Registered Nurse

## 2016-08-21 VITALS — BP 151/93 | HR 73

## 2016-08-21 DIAGNOSIS — G894 Chronic pain syndrome: Secondary | ICD-10-CM

## 2016-08-21 DIAGNOSIS — R1030 Lower abdominal pain, unspecified: Secondary | ICD-10-CM | POA: Insufficient documentation

## 2016-08-21 DIAGNOSIS — Z5181 Encounter for therapeutic drug level monitoring: Secondary | ICD-10-CM

## 2016-08-21 DIAGNOSIS — Z79899 Other long term (current) drug therapy: Secondary | ICD-10-CM | POA: Diagnosis not present

## 2016-08-21 DIAGNOSIS — M150 Primary generalized (osteo)arthritis: Secondary | ICD-10-CM | POA: Diagnosis present

## 2016-08-21 DIAGNOSIS — G5790 Unspecified mononeuropathy of unspecified lower limb: Secondary | ICD-10-CM | POA: Insufficient documentation

## 2016-08-21 DIAGNOSIS — G5792 Unspecified mononeuropathy of left lower limb: Secondary | ICD-10-CM | POA: Diagnosis not present

## 2016-08-21 DIAGNOSIS — R1032 Left lower quadrant pain: Secondary | ICD-10-CM | POA: Diagnosis not present

## 2016-08-21 DIAGNOSIS — M7581 Other shoulder lesions, right shoulder: Secondary | ICD-10-CM | POA: Insufficient documentation

## 2016-08-21 DIAGNOSIS — M47817 Spondylosis without myelopathy or radiculopathy, lumbosacral region: Secondary | ICD-10-CM

## 2016-08-21 DIAGNOSIS — M62830 Muscle spasm of back: Secondary | ICD-10-CM

## 2016-08-21 MED ORDER — HYDROCODONE-ACETAMINOPHEN 10-325 MG PO TABS: 1.0000 | ORAL_TABLET | Freq: Two times a day (BID) | ORAL | 0 refills | Status: DC | PRN

## 2016-08-21 MED ORDER — MELOXICAM 15 MG PO TABS: 15.0000 mg | ORAL_TABLET | Freq: Every day | ORAL | 1 refills | Status: DC
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
## 2016-08-24 ENCOUNTER — Telehealth: Payer: Self-pay | Admitting: Registered Nurse

## 2016-08-24 NOTE — Telephone Encounter (Signed)
On 08/24/2016 the NCCSR was reviewed no conflict was seen on the [facility] [place] [place] Cont[place] System with multiple prescribers. [name] has a signed narcotic contract with our office. If there were any discrepancies this would have been reported to his physician.

## 2016-08-26 LAB — TOXASSURE SELECT,+ANTIDEPR,UR

## 2016-08-31 ENCOUNTER — Telehealth: Payer: Self-pay | Admitting: *Deleted
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
## 2016-10-14 ENCOUNTER — Encounter: Payer: Self-pay | Admitting: Nurse Practitioner

## 2016-10-14 ENCOUNTER — Ambulatory Visit (INDEPENDENT_AMBULATORY_CARE_PROVIDER_SITE_OTHER): Payer: Medicare Other | Admitting: Nurse Practitioner

## 2016-10-14 VITALS — BP 150/86 | HR 59 | Temp 98.0°F | Ht 68.0 in | Wt 188.0 lb

## 2016-10-14 DIAGNOSIS — G4700 Insomnia, unspecified: Secondary | ICD-10-CM | POA: Diagnosis not present

## 2016-10-14 DIAGNOSIS — E785 Hyperlipidemia, unspecified: Secondary | ICD-10-CM | POA: Diagnosis not present

## 2016-10-14 DIAGNOSIS — I10 Essential (primary) hypertension: Secondary | ICD-10-CM

## 2016-10-14 MED ORDER — TRIAMTERENE-HCTZ 37.5-25 MG PO TABS
1.0000 | ORAL_TABLET | Freq: Every day | ORAL | 0 refills | Status: DC
Start: 2016-10-14 — End: 2017-01-14

## 2016-10-14 MED ORDER — ZOLPIDEM TARTRATE 5 MG PO TABS: 5.0000 mg | ORAL_TABLET | Freq: Every evening | ORAL | 0 refills | Status: DC | PRN

## 2016-10-14 NOTE — Assessment & Plan Note (Signed)
BP not at goal with amlodipine. Added maxzide.

## 2016-10-14 NOTE — Assessment & Plan Note (Signed)
LDL at goal with lipitor. Repeat lipid panel today
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
## 2016-10-14 NOTE — Assessment & Plan Note (Signed)
No improvement with benadryl, melatonin, and belsomra. ambien prescribed. He was counseled about risk and benefits of using ambien in combination with hydrocodone. He was advised not to take medications within 2hrs of each other due to risk of respiratory depression

## 2016-10-14 NOTE — Patient Instructions (Addendum)
Go to basement for blood draw. You will be called with results   DASH Eating Plan DASH stands for "Dietary Approaches to Stop Hypertension." The DASH eating plan is a healthy eating plan that has been shown to reduce high blood pressure (hypertension). It may also reduce your risk for type 2 diabetes, heart disease, and stroke. The DASH eating plan may also help with weight loss. What are tips for following this plan? General guidelines  Avoid eating more than 2,300 mg (milligrams) of salt (sodium) a day. If you have hypertension, you may need to reduce your sodium intake to 1,500 mg a day.  Limit alcohol intake to no more than 1 drink a day for nonpregnant women and 2 drinks a day for men. One drink equals 12 oz of beer, 5 oz of wine, or 1 oz of hard liquor.  Work with your health care provider to maintain a healthy body weight or to lose weight. Ask what an ideal weight is for you.  Get at least 30 minutes of exercise that causes your heart to beat faster (aerobic exercise) most days of the week. Activities may include walking, swimming, or biking.  Work with your health care provider or diet and nutrition specialist (dietitian) to adjust your eating plan to your individual calorie needs. Reading food labels  Check food labels for the amount of sodium per serving. Choose foods with less than 5 percent of the Daily Value of sodium. Generally, foods with less than 300 mg of sodium per serving fit into this eating plan.  To find whole grains, look for the word "whole" as the first word in the ingredient list. Shopping  Buy products labeled as "low-sodium" or "no salt added."  Buy fresh foods. Avoid canned foods and premade or frozen meals. Cooking  Avoid adding salt when cooking. Use salt-free seasonings or herbs instead of table salt or sea salt. Check with your health care provider or pharmacist before using salt substitutes.  Do not fry foods. Cook foods using healthy methods such  as baking, boiling, grilling, and broiling instead.  Cook with heart-healthy oils, such as olive, canola, soybean, or sunflower oil. Meal planning   Eat a balanced diet that includes: ? 5 or more servings of fruits and vegetables each day. At each meal, try to fill half of your plate with fruits and vegetables. ? Up to 6-8 servings of whole grains each day. ? Less than 6 oz of lean meat, poultry, or fish each day. A 3-oz serving of meat is about the same size as a deck of cards. One egg equals 1 oz. ? 2 servings of low-fat dairy each day. ? A serving of nuts, seeds, or beans 5 times each week. ? Heart-healthy fats. Healthy fats called Omega-3 fatty acids are found in foods such as flaxseeds and coldwater fish, like sardines, salmon, and mackerel.  Limit how much you eat of the following: ? Canned or prepackaged foods. ? Food that is high in trans fat, such as fried foods. ? Food that is high in saturated fat, such as fatty meat. ? Sweets, desserts, sugary drinks, and other foods with added sugar. ? Full-fat dairy products.  Do not salt foods before eating.  Try to eat at least 2 vegetarian meals each week.  Eat more home-cooked food and less restaurant, buffet, and fast food.  When eating at a restaurant, ask that your food be prepared with less salt or no salt, if possible. What foods are recommended? The  items listed may not be a complete list. Talk with your dietitian about what dietary choices are best for you. Grains Whole-grain or whole-wheat bread. Whole-grain or whole-wheat pasta. Brown rice. [place]. Bulgur. Whole-grain and low-sodium cereals. Pita bread. Low-fat, low-sodium crackers. Whole-wheat flour tortillas. Vegetables Fresh or frozen vegetables (raw, steamed, roasted, or grilled). Low-sodium or reduced-sodium tomato and vegetable juice. Low-sodium or reduced-sodium tomato sauce and tomato paste. Low-sodium or reduced-sodium canned vegetables. Fruits All  fresh, dried, or frozen fruit. Canned fruit in natural juice (without added sugar). Meat and other protein foods Skinless chicken or [place]. Ground chicken or [place]. Pork with fat trimmed off. Fish and seafood. Egg whites. Dried beans, peas, or lentils. Unsalted nuts, nut butters, and seeds. Unsalted canned beans. Lean cuts of beef with fat trimmed off. Low-sodium, lean deli meat. Dairy Low-fat (1%) or fat-free (skim) milk. Fat-free, low-fat, or reduced-fat cheeses. Nonfat, low-sodium ricotta or cottage cheese. Low-fat or nonfat yogurt. Low-fat, low-sodium cheese. Fats and oils Soft margarine without trans fats. Vegetable oil. Low-fat, reduced-fat, or light mayonnaise and salad dressings (reduced-sodium). Canola, safflower, olive, soybean, and sunflower oils. Avocado. Seasoning and other foods Herbs. Spices. Seasoning mixes without salt. Unsalted popcorn and pretzels. Fat-free sweets. What foods are not recommended? The items listed may not be a complete list. Talk with your dietitian about what dietary choices are best for you. Grains Baked goods made with fat, such as croissants, muffins, or some breads. Dry pasta or rice meal packs. Vegetables Creamed or fried vegetables. Vegetables in a cheese sauce. Regular canned vegetables (not low-sodium or reduced-sodium). Regular canned tomato sauce and paste (not low-sodium or reduced-sodium). Regular tomato and vegetable juice (not low-sodium or reduced-sodium). Angie Fava. Olives. Fruits Canned fruit in a light or heavy syrup. Fried fruit. Fruit in cream or butter sauce. Meat and other protein foods Fatty cuts of meat. Ribs. Fried meat. Berniece Salines. Sausage. Bologna and other processed lunch meats. Salami. Fatback. Hotdogs. Bratwurst. Salted nuts and seeds. Canned beans with added salt. Canned or smoked fish. Whole eggs or egg yolks. Chicken or [place] with skin. Dairy Whole or 2% milk, cream, and half-and-half. Whole or full-fat cream cheese. Whole-fat or  sweetened yogurt. Full-fat cheese. Nondairy creamers. Whipped toppings. Processed cheese and cheese spreads. Fats and oils Butter. Stick margarine. Lard. Shortening. Ghee. Bacon fat. Tropical oils, such as coconut, palm kernel, or palm oil. Seasoning and other foods Salted popcorn and pretzels. Onion salt, garlic salt, seasoned salt, table salt, and sea salt. Worcestershire sauce. Tartar sauce. Barbecue sauce. Teriyaki sauce. Soy sauce, including reduced-sodium. Steak sauce. Canned and packaged gravies. Fish sauce. Oyster sauce. Cocktail sauce. Horseradish that you find on the shelf. Ketchup. Mustard. Meat flavorings and tenderizers. Bouillon cubes. Hot sauce and Tabasco sauce. Premade or packaged marinades. Premade or packaged taco seasonings. Relishes. Regular salad dressings. Where to find more information:  National Heart, Lung, and [place]: https://wilson-eaton.com/  American Heart Association: www.heart.org Summary  The DASH eating plan is a healthy eating plan that has been shown to reduce high blood pressure (hypertension). It may also reduce your risk for type 2 diabetes, heart disease, and stroke.  With the DASH eating plan, you should limit salt (sodium) intake to 2,300 mg a day. If you have hypertension, you may need to reduce your sodium intake to 1,500 mg a day.  When on the DASH eating plan, aim to eat more fresh fruits and vegetables, whole grains, lean proteins, low-fat dairy, and heart-healthy fats.  Work with your health care provider  or diet and nutrition specialist (dietitian) to adjust your eating plan to your individual calorie needs. This information is not intended to replace advice given to you by your health care provider. Make sure you discuss any questions you have with your health care provider. Document Released: 02/26/2011 Document Revised: 03/02/2016 Document Reviewed: 03/02/2016 Elsevier Interactive Patient Education  2017 [employer].

## 2016-10-21 ENCOUNTER — Encounter: Payer: Self-pay | Admitting: Registered Nurse

## 2016-10-21 ENCOUNTER — Encounter: Payer: Medicare Other | Attending: Physical Medicine & Rehabilitation | Admitting: Registered Nurse

## 2016-10-21 VITALS — BP 167/92 | HR 67

## 2016-10-21 DIAGNOSIS — M542 Cervicalgia: Secondary | ICD-10-CM | POA: Diagnosis not present

## 2016-10-21 DIAGNOSIS — R1030 Lower abdominal pain, unspecified: Secondary | ICD-10-CM | POA: Insufficient documentation

## 2016-10-21 DIAGNOSIS — M150 Primary generalized (osteo)arthritis: Secondary | ICD-10-CM | POA: Insufficient documentation

## 2016-10-21 DIAGNOSIS — R1032 Left lower quadrant pain: Secondary | ICD-10-CM | POA: Diagnosis not present

## 2016-10-21 DIAGNOSIS — M62830 Muscle spasm of back: Secondary | ICD-10-CM | POA: Diagnosis not present

## 2016-10-21 DIAGNOSIS — M7581 Other shoulder lesions, right shoulder: Secondary | ICD-10-CM | POA: Insufficient documentation

## 2016-10-21 DIAGNOSIS — G5790 Unspecified mononeuropathy of unspecified lower limb: Secondary | ICD-10-CM | POA: Diagnosis present

## 2016-10-21 DIAGNOSIS — G894 Chronic pain syndrome: Secondary | ICD-10-CM | POA: Insufficient documentation

## 2016-10-21 DIAGNOSIS — M47817 Spondylosis without myelopathy or radiculopathy, lumbosacral region: Secondary | ICD-10-CM

## 2016-10-21 DIAGNOSIS — Z79899 Other long term (current) drug therapy: Secondary | ICD-10-CM

## 2016-10-21 DIAGNOSIS — Z5181 Encounter for therapeutic drug level monitoring: Secondary | ICD-10-CM | POA: Diagnosis not present

## 2016-10-21 MED ORDER — HYDROCODONE-ACETAMINOPHEN 10-325 MG PO TABS: 1.0000 | ORAL_TABLET | Freq: Two times a day (BID) | ORAL | 0 refills | Status: DC | PRN

## 2016-10-21 NOTE — Progress Notes (Signed)
Subjective:    Patient ID: Shaun Lang, male    DOB: 06/16/1955, 61 y.o.   MRN: [id number]  HPI: Shaun Lang is a 61year old male who returns for follow up appointmentfor chronic pain and medication refill. He states his pain is located in his neck and  lower back. He rates his pain 7. His current exercise regime is walking.   Last UDS was performed on 08/21/2016.  Pain Inventory Average Pain 7 Pain Right Now 7 My pain is sharp, dull and aching  In the last 24 hours, has pain interfered with the following? General activity 9 Relation with others 9 Enjoyment of life 8 What TIME of day is your pain at its worst? morning Sleep (in general) Poor  Pain is worse with: walking, bending and some activites Pain improves with: medication Relief from Meds: 6  Mobility how many minutes can you walk? 10 ability to climb steps?  yes do you drive?  yes  Function retired  Neuro/Psych tingling spasms  Prior Studies Any changes since last visit?  no  Physicians involved in your care Any changes since last visit?  no   Family History  Problem Relation Age of Onset  . Diabetes Mother   . Cancer Sister        breast  . Diabetes Sister   . Stroke Sister [id number]   Social History   Social History  . Marital status: Single    Spouse name: N/A  . Number of children: N/A  . Years of education: N/A   Social History Main Topics  . Smoking status: Current Every Day Smoker    Packs/day: 1.00    Types: Cigarettes  . Smokeless tobacco: Never Used  . Alcohol use Yes     Comment: rare  . Drug use: No  . Sexual activity: Not Asked   Other Topics Concern  . None   Social History Narrative  . None   Past Surgical History:  Procedure Laterality Date  . HERNIA REPAIR  04/1998   LIH  . NECK SURGERY  07/21/02 and 01/2006  . WRIST SURGERY  1992   right   Past Medical History:  Diagnosis Date  . Bell's palsy April 2010  . Chest pain with low risk for cardiac  etiology    Low Risk Myoview Sept 2013  . DJD (degenerative joint disease)    disabilty secondary to neck issues  . Hyperlipidemia   . Hypertension   . Smoker    BP (!) 167/92 Comment: forgot to take his BP med  Pulse 67   SpO2 97%   Opioid Risk Score:  3 Fall Risk Score:  `1  Depression screen PHQ 2/9  [facility] 10/21/2016 08/21/2016 12/19/2015 07/03/2015 05/08/2015 12/03/2014 06/05/2014  Decreased Interest 0 0 1 2 2 2 1   Down, Depressed, Hopeless 0 0 0 2 2 2  0  PHQ - 2 Score 0 0 1 4 4 4 1   Altered sleeping - - - - - - 3  Tired, decreased energy - - - - - - 2  Change in appetite - - - - - - 1  Feeling bad or failure about yourself  - - - - - - 0  Trouble concentrating - - - - - - 2  Moving slowly or fidgety/restless - - - - - - 0  Suicidal thoughts - - - - - - 0  PHQ-9 Score - - - - - - 9  Review of Systems  Constitutional: Negative.   HENT: Negative.   Eyes: Negative.   Respiratory: Negative.   Cardiovascular: Negative.   Gastrointestinal: Negative.   Endocrine: Negative.   Genitourinary: Negative.   Musculoskeletal:       Spasm  Skin: Negative.   Allergic/Immunologic: Negative.   Neurological:       Tingling  Hematological: Negative.   Psychiatric/Behavioral: Negative.   All other systems reviewed and are negative.      Objective:   Physical Exam  Constitutional: He is oriented to person, place, and time. He appears well-developed and well-nourished.  HENT:  Head: Normocephalic and atraumatic.  Neck: Normal range of motion. Neck supple.  No Cervical Tenderness  Cardiovascular: Normal rate and regular rhythm.   Pulmonary/Chest: Effort normal and breath sounds normal.  Musculoskeletal:  Normal Muscle Bulk and Muscle Testing Reveals: Upper Extremities: Full ROM and Muscle Strength 5/5 Back without spinal tenderness noted Lower Extremities: Full ROM and Muscle Strength 5/5 Arises from chair with ease Narrow Based Gait  Neurological: He is  alert and oriented to person, place, and time.  Skin: Skin is warm and dry.  Psychiatric: He has a normal mood and affect.  Nursing note and vitals reviewed.         Assessment & Plan:  1. Chronic left inguinal pain: S/P inguinal hernia (indirect) and mesh repair. Continue to Monitor. 10/21/2016 Refilled: Hydrocodone 10/325 mg one tablet daily as needed #60. This prescription must last 60 days We will continue the opioid monitoring program, this consists of regular clinic visits, examinations, urine drug screen, pill counts as well as use of [place]  Controlled Substance Reporting system. 2. Lumbosacral Spondylosis: Continue with Exercise regime. Continue to Monitor. 10/21/2016 3. Muscle Spasm: Continue Flexeril. 10/21/2016  20 minutes of face to face patient care time was spent during this visit. All questions were encouraged and answered.  F/U in 2 months

## 2016-10-30 ENCOUNTER — Other Ambulatory Visit (INDEPENDENT_AMBULATORY_CARE_PROVIDER_SITE_OTHER): Payer: Medicare Other

## 2016-10-30 DIAGNOSIS — G4700 Insomnia, unspecified: Secondary | ICD-10-CM | POA: Diagnosis not present

## 2016-10-30 DIAGNOSIS — I10 Essential (primary) hypertension: Secondary | ICD-10-CM | POA: Diagnosis not present

## 2016-10-30 DIAGNOSIS — E785 Hyperlipidemia, unspecified: Secondary | ICD-10-CM | POA: Diagnosis not present

## 2016-10-30 LAB — BASIC METABOLIC PANEL
BUN: 15 mg/dL (ref 6–23)
CHLORIDE: 105 meq/L (ref 96–112)
CO2: 26 meq/L (ref 19–32)
CREATININE: 1.26 mg/dL (ref 0.40–1.50)
Calcium: 9.2 mg/dL (ref 8.4–10.5)
GFR: 74.71 mL/min (ref >60.00)
Glucose, Bld: 99 mg/dL (ref 70–99)
Potassium: 3.8 mEq/L (ref 3.5–5.1)
Sodium: 138 mEq/L (ref 135–145)

## 2016-10-30 LAB — HEPATIC FUNCTION PANEL
ALT: 23 U/L (ref 0–53)
AST: 19 U/L (ref 0–37)
Albumin: 4.5 g/dL (ref 3.5–5.2)
Alkaline Phosphatase: 55 U/L (ref 39–117)
BILIRUBIN DIRECT: 0.1 mg/dL (ref 0.0–0.3)
TOTAL PROTEIN: 7.3 g/dL (ref 6.0–8.3)
Total Bilirubin: 0.6 mg/dL (ref 0.2–1.2)

## 2016-10-30 LAB — LIPID PANEL
CHOL/HDL RATIO: 3
Cholesterol: 143 mg/dL (ref 0–200)
HDL: 51.9 mg/dL (ref >39.00)
LDL Cholesterol: 76 mg/dL (ref 0–99)
NONHDL: 91.57
Triglycerides: 80 mg/dL (ref 0.0–149.0)
VLDL: 16 mg/dL (ref 0.0–40.0)

## 2016-10-30 LAB — TSH: TSH: 0.64 u[IU]/mL (ref 0.35–4.50)

## 2016-11-02 ENCOUNTER — Telehealth: Payer: Self-pay | Admitting: Nurse Practitioner

## 2016-11-02 NOTE — Telephone Encounter (Signed)
Pt stated he will come in and drop off the report tomorrow.   This is a program that they are trying out different brand of cigarette with the low nicotine.

## 2016-11-02 NOTE — Telephone Encounter (Signed)
Spoke with pt, he said that he is trying to get into cigarette program but they require a normal EKG before he can get in. They need a letter or approval from [place] stating it is okey for pt to participate in this study because they found irregular heart beat during the EKG (they had to stop).

## 2016-11-02 NOTE — Telephone Encounter (Signed)
Is this an experimental program?  IF THEY SAY HE HAD IRREGULAR HEART SOUNDS ON EXAM, THEN HE NEEDS OV.

## 2016-11-03 NOTE — Telephone Encounter (Signed)
EKG reviewed. It is not different from ECG done 10/22/2015. No additional testing needed at this time.  I do not know the details of this study, nor how it will be conducted, hence I can not advise if it is safe or not to participate in it.

## 2016-11-03 NOTE — Telephone Encounter (Signed)
Place EKG on the desk to review.

## 2016-11-06 NOTE — Telephone Encounter (Signed)
Pt is aware. He said do not worry about this right now.

## 2016-12-01 ENCOUNTER — Ambulatory Visit (HOSPITAL_COMMUNITY)
Admission: EM | Admit: 2016-12-01 | Discharge: 2016-12-01 | Disposition: A | Discharge status: Home / Self Care | Payer: Medicare Other | Attending: Emergency Medicine | Admitting: Emergency Medicine

## 2016-12-01 ENCOUNTER — Encounter (HOSPITAL_COMMUNITY): Payer: Self-pay | Admitting: *Deleted

## 2016-12-01 DIAGNOSIS — R21 Rash and other nonspecific skin eruption: Secondary | ICD-10-CM | POA: Diagnosis not present

## 2016-12-01 MED ORDER — CEPHALEXIN 500 MG PO CAPS: 500.0000 mg | ORAL_CAPSULE | Freq: Four times a day (QID) | ORAL | 0 refills | Status: AC

## 2016-12-01 MED ORDER — MUPIROCIN 2 % EX OINT: 1.0000 "application " | TOPICAL_OINTMENT | Freq: Three times a day (TID) | CUTANEOUS | 0 refills | Status: DC

## 2016-12-01 NOTE — ED Triage Notes (Signed)
pT  REPORTS  HE  WAS   WORKING  OUTSIDE  ABOUT  1  WEEK  AGO  WHEN HE  DEVELOPED   A  RASH ON  HIS  L  KNEE      THE  LESIONS   ARE  DRAINING     AND  ITCH     AS   WELL HE  DENIES  ANY  SPECEFIC  INJURY  AND  IS  AMBULATORY  WITH STEADY  FLUID  GAIT
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
## 2016-12-01 NOTE — Discharge Instructions (Signed)
Try some Claritin or Zyrtec to help with the itching. Finish the Keflex and Bactroban which will take care of infection. Use the Bactroban for 10 days. Otherwise we'll set, water and keep it covered when you're out and about. If it is still itchy after several days, he may try some hydrocortisone cream on this. This could be a contact dermatitis from poison ivy.

## 2016-12-05 ENCOUNTER — Encounter (HOSPITAL_COMMUNITY): Payer: Self-pay | Admitting: Emergency Medicine

## 2016-12-05 ENCOUNTER — Emergency Department (HOSPITAL_COMMUNITY)
Admission: EM | Admit: 2016-12-05 | Discharge: 2016-12-06 | Disposition: A | Discharge status: Home / Self Care | Payer: Medicare Other | Attending: Emergency Medicine | Admitting: Emergency Medicine

## 2016-12-05 ENCOUNTER — Emergency Department (HOSPITAL_COMMUNITY): Payer: Medicare Other

## 2016-12-05 DIAGNOSIS — F10920 Alcohol use, unspecified with intoxication, uncomplicated: Secondary | ICD-10-CM | POA: Insufficient documentation

## 2016-12-05 DIAGNOSIS — Y939 Activity, unspecified: Secondary | ICD-10-CM | POA: Insufficient documentation

## 2016-12-05 DIAGNOSIS — S0990XA Unspecified injury of head, initial encounter: Secondary | ICD-10-CM | POA: Diagnosis present

## 2016-12-05 DIAGNOSIS — I10 Essential (primary) hypertension: Secondary | ICD-10-CM | POA: Insufficient documentation

## 2016-12-05 DIAGNOSIS — Y92410 Unspecified street and highway as the place of occurrence of the external cause: Secondary | ICD-10-CM | POA: Diagnosis not present

## 2016-12-05 DIAGNOSIS — F17210 Nicotine dependence, cigarettes, uncomplicated: Secondary | ICD-10-CM | POA: Diagnosis not present

## 2016-12-05 DIAGNOSIS — Y999 Unspecified external cause status: Secondary | ICD-10-CM | POA: Insufficient documentation

## 2016-12-05 DIAGNOSIS — Z79899 Other long term (current) drug therapy: Secondary | ICD-10-CM | POA: Diagnosis not present

## 2016-12-05 DIAGNOSIS — R55 Syncope and collapse: Secondary | ICD-10-CM | POA: Diagnosis not present

## 2016-12-05 DIAGNOSIS — S161XXA Strain of muscle, fascia and tendon at neck level, initial encounter: Secondary | ICD-10-CM | POA: Insufficient documentation

## 2016-12-05 LAB — CBG MONITORING, ED: GLUCOSE-CAPILLARY: 127 mg/dL — AB (ref 65–99)
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
## 2016-12-05 NOTE — ED Notes (Signed)
[employer] at bedside 

## 2016-12-05 NOTE — ED Notes (Signed)
Refusing blood draw

## 2016-12-05 NOTE — ED Notes (Signed)
Irrigated bilateral eyes with 0.9% normal saline for 15 minutes.
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
## 2016-12-05 NOTE — ED Notes (Signed)
Consulted Denise from [employer] of exposure to [employer] in eyes, they recommend continous irrigation for 15 minutes with saline.

## 2016-12-05 NOTE — ED Notes (Signed)
Patient transported to CT 

## 2016-12-06 ENCOUNTER — Emergency Department (HOSPITAL_COMMUNITY): Payer: Medicare Other

## 2016-12-06 DIAGNOSIS — S161XXA Strain of muscle, fascia and tendon at neck level, initial encounter: Secondary | ICD-10-CM | POA: Diagnosis not present
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
## 2016-12-06 NOTE — ED Notes (Signed)
Refused forensic lab draw

## 2016-12-06 NOTE — ED Notes (Signed)
Pt left at this time with all belongings.  

## 2016-12-06 NOTE — ED Notes (Signed)
Pt continuing to refuse lab collects. Will discontinue orders.

## 2016-12-16 ENCOUNTER — Encounter: Payer: Medicare Other | Attending: Physical Medicine & Rehabilitation | Admitting: Physical Medicine & Rehabilitation

## 2016-12-16 ENCOUNTER — Encounter: Payer: Self-pay | Admitting: Physical Medicine & Rehabilitation

## 2016-12-16 VITALS — BP 146/90 | HR 65

## 2016-12-16 DIAGNOSIS — R1030 Lower abdominal pain, unspecified: Secondary | ICD-10-CM | POA: Insufficient documentation

## 2016-12-16 DIAGNOSIS — I10 Essential (primary) hypertension: Secondary | ICD-10-CM | POA: Diagnosis not present

## 2016-12-16 DIAGNOSIS — Z5181 Encounter for therapeutic drug level monitoring: Secondary | ICD-10-CM | POA: Insufficient documentation

## 2016-12-16 DIAGNOSIS — M7581 Other shoulder lesions, right shoulder: Secondary | ICD-10-CM | POA: Diagnosis present

## 2016-12-16 DIAGNOSIS — G894 Chronic pain syndrome: Secondary | ICD-10-CM | POA: Diagnosis present

## 2016-12-16 DIAGNOSIS — M150 Primary generalized (osteo)arthritis: Secondary | ICD-10-CM | POA: Insufficient documentation

## 2016-12-16 DIAGNOSIS — G5790 Unspecified mononeuropathy of unspecified lower limb: Secondary | ICD-10-CM | POA: Diagnosis present

## 2016-12-16 DIAGNOSIS — R1032 Left lower quadrant pain: Secondary | ICD-10-CM | POA: Diagnosis not present

## 2016-12-16 MED ORDER — MELOXICAM 15 MG PO TABS: 15.0000 mg | ORAL_TABLET | Freq: Every day | ORAL | 3 refills | Status: DC | PRN

## 2016-12-16 MED ORDER — HYDROCODONE-ACETAMINOPHEN 10-325 MG PO TABS: 1.0000 | ORAL_TABLET | Freq: Two times a day (BID) | ORAL | 0 refills | Status: DC | PRN

## 2016-12-16 NOTE — Patient Instructions (Signed)
PLEASE FEEL FREE TO CALL OUR OFFICE WITH ANY PROBLEMS OR QUESTIONS ([phone])      

## 2016-12-16 NOTE — Progress Notes (Signed)
Subjective:    Patient ID: Shaun Lang, male    DOB: 1955/11/29, 61 y.o.   MRN: [id number]  HPI   Mr. Henard is here in follow up of his chronic pain. His pain levels have been fairly consistent. He hasn't noticed any change in pattern or consistency of pain. His pain medication remains effective. He is using hydrocodone for breakthrough pain usually twice per day. He hasn't been keeping up with a HEP although his weight has remained steady. [name]   His bowels and bladder are moving regularly.   He recently contracted poison ivy rash which has since improved. Otherwise his medical status has been stable. He checks his bp routinely at home and it's been under control. It was slightly elevated today in office which surprised him.      Pain Inventory Average Pain 7 Pain Right Now 8 My pain is sharp, stabbing, tingling and aching  In the last 24 hours, has pain interfered with the following? General activity 5 Relation with others 6 Enjoyment of life 8 What TIME of day is your pain at its worst? evening Sleep (in general) Poor  Pain is worse with: bending, standing and some activites Pain improves with: therapy/exercise and TENS Relief from Meds: 5  Mobility walk without assistance ability to climb steps?  yes do you drive?  no  Function retired  Neuro/Psych No problems in this area  Prior Studies Any changes since last visit?  no  Physicians involved in your care Any changes since last visit?  no   Family History  Problem Relation Age of Onset  . Diabetes Mother   . Cancer Sister        breast  . Diabetes Sister   . Stroke Sister 52   Social History   Social History  . Marital status: Single    Spouse name: N/A  . Number of children: N/A  . Years of education: N/A   Social History Main Topics  . Smoking status: Current Every Day Smoker    Packs/day: 1.00    Types: Cigarettes  . Smokeless tobacco: Never Used  . Alcohol use Yes     Comment: rare  .  Drug use: No  . Sexual activity: Not Asked   Other Topics Concern  . None   Social History Narrative  . None   Past Surgical History:  Procedure Laterality Date  . HERNIA REPAIR  04/1998   [facility]  . NECK SURGERY  07/21/02 and 01/2006  . WRIST SURGERY  1992   right   Past Medical History:  Diagnosis Date  . Bell's palsy April 2010  . Chest pain with low risk for cardiac etiology    Low Risk Myoview Sept 2013  . DJD (degenerative joint disease)    disabilty secondary to neck issues  . Hyperlipidemia   . Hypertension   . Smoker    BP (!) 146/90   Pulse 65   SpO2 98%   Opioid Risk Score:   Fall Risk Score:  `1  Depression screen PHQ 2/9  Depression screen [facility] 2/9 10/21/2016 08/21/2016 12/19/2015 07/03/2015 05/08/2015 12/03/2014 06/05/2014  Decreased Interest 0 0 Down, Depressed, Hopeless 0 0 0 0  PHQ - 2 Score 0 0 Altered sleeping - - - - - - 3  Tired, decreased energy - - - - - - 2  Change in appetite - - - - - -  1  Feeling bad or failure about yourself  - - - - - - 0  Trouble concentrating - - - - - - 2  Moving slowly or fidgety/restless - - - - - - 0  Suicidal thoughts - - - - - - 0  PHQ-9 Score - - - - - - 9     Review of Systems  Constitutional: Negative.   HENT: Negative.   Eyes: Negative.   Respiratory: Negative.   Cardiovascular: Negative.   Gastrointestinal: Negative.   Endocrine: Negative.   Genitourinary: Negative.   Musculoskeletal: Negative.   Skin: Negative.   Allergic/Immunologic: Negative.   Neurological: Negative.   Hematological: Negative.   Psychiatric/Behavioral: Negative.   All other systems reviewed and are negative.      Objective:   Physical Exam  General: NAD, weight stable HEENT:PERRL Neck:Supple without JVD or lymphadenopathy  Heart: RRR Chest:CTA B  Abdomen:NT/ND.  Extremities:No clubbing, cyanosis, or edema. Pulses are 2+  Skin:Clean and intact without signs of breakdown  Neuro:Normal  cognitiion. Strength 5/5 Musculoskeletal:continued pain in the left inguinal line medial to the ASIS and superior to the inguinal ligament. Minimal knee pain. Mild pain in the lumbar spine with ROM.  Able to bend to 90 degrees. Minimal discomfort with extension/rotation or lateral bending Psych: pleasant and cooperative   Assessment & Plan:  1. Chronic left inguinal pain- appears to be related to his prior inguinal hernia (indirect) and mesh repair. This pain appears to be more related to scar tissue, muscle imbalance, posture, etc. Pain worsens with activity. I have concerns this could be hip joint as well  2. Left rotator cuff syndrome/bursitis and associated left AC joint arthritis  3. Mild CTS  4. Insomnia   Plan:  1. Needs to be regular with stretching and exercise. This will help with his pain as well as his sleep habits as well.  2. Continue neurontin at  bid to tid. Refilled meloxicam today --use prn 3. Refilled hydrocodone today #60. Told him I expect this to last 2 months.  We will continue the opioid monitoring program, this consists of regular clinic visits, examinations, urine drug screen, pill counts as well as use of [place] Controlled Substance Reporting System. 4. Advised to use mobic daily with food.  5. Insomnia---reviewed appropriate sleep habits.  6. Follow up with NP in 2 months. 15 minutes of face to face patient care time were spent during this visit. All questions were encouraged and answered. Greater than 50% of time during this encounter was spent counseling patient/family in regard to HEP, pain mgt options, med review.  he will continue to check his bp at home. He was advised to contact his primary if he saw any consistent elevation

## 2017-01-14 ENCOUNTER — Ambulatory Visit: Payer: Medicare Other | Admitting: Nurse Practitioner

## 2017-01-14 ENCOUNTER — Ambulatory Visit (INDEPENDENT_AMBULATORY_CARE_PROVIDER_SITE_OTHER): Payer: Medicare Other | Admitting: Nurse Practitioner

## 2017-01-14 ENCOUNTER — Ambulatory Visit (INDEPENDENT_AMBULATORY_CARE_PROVIDER_SITE_OTHER)
Admission: RE | Admit: 2017-01-14 | Discharge: 2017-01-14 | Disposition: A | Discharge status: Home / Self Care | Payer: Medicare Other | Source: Ambulatory Visit | Attending: Nurse Practitioner | Admitting: Nurse Practitioner

## 2017-01-14 ENCOUNTER — Encounter: Payer: Self-pay | Admitting: Nurse Practitioner

## 2017-01-14 VITALS — BP 142/88 | HR 62 | Temp 97.6°F | Ht 68.0 in | Wt 187.0 lb

## 2017-01-14 DIAGNOSIS — G4700 Insomnia, unspecified: Secondary | ICD-10-CM

## 2017-01-14 DIAGNOSIS — I10 Essential (primary) hypertension: Secondary | ICD-10-CM

## 2017-01-14 DIAGNOSIS — M21612 Bunion of left foot: Secondary | ICD-10-CM

## 2017-01-14 DIAGNOSIS — M79675 Pain in left toe(s): Secondary | ICD-10-CM | POA: Diagnosis not present

## 2017-01-14 DIAGNOSIS — E785 Hyperlipidemia, unspecified: Secondary | ICD-10-CM | POA: Diagnosis not present

## 2017-01-14 DIAGNOSIS — S92412A Displaced fracture of proximal phalanx of left great toe, initial encounter for closed fracture: Secondary | ICD-10-CM

## 2017-01-14 MED ORDER — TRIAMTERENE-HCTZ 37.5-25 MG PO TABS: 1.0000 | ORAL_TABLET | Freq: Every day | ORAL | 1 refills | Status: DC

## 2017-01-14 MED ORDER — ATORVASTATIN CALCIUM 40 MG PO TABS: 40.0000 mg | ORAL_TABLET | Freq: Every day | ORAL | 1 refills | Status: DC

## 2017-01-14 MED ORDER — ZOLPIDEM TARTRATE 5 MG PO TABS: 5.0000 mg | ORAL_TABLET | Freq: Every evening | ORAL | 1 refills | Status: DC | PRN

## 2017-01-14 MED ORDER — AMLODIPINE BESYLATE 5 MG PO TABS: 5.0000 mg | ORAL_TABLET | Freq: Every day | ORAL | 1 refills | Status: DC

## 2017-01-14 NOTE — Patient Instructions (Addendum)
Go to basement for toe x-ray. You will be contacted with results.  You will be contacted to schedule appt for sleep test.  Continue current medications as prescribed. Maintain DASH diet.  Please discuss back pain with pain clinic.
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
## 2017-01-14 NOTE — Assessment & Plan Note (Signed)
No improvement with ambien. Entered referral for sleep study. Maintain ambien.

## 2017-01-14 NOTE — Assessment & Plan Note (Signed)
No improvement in BP. Noncompliance with medication. No change in medications today.

## 2017-01-15 ENCOUNTER — Ambulatory Visit (INDEPENDENT_AMBULATORY_CARE_PROVIDER_SITE_OTHER): Payer: Medicare Other | Admitting: Podiatry

## 2017-01-15 VITALS — BP 149/91 | HR 68 | Ht 68.0 in | Wt 182.0 lb

## 2017-01-15 DIAGNOSIS — M19079 Primary osteoarthritis, unspecified ankle and foot: Secondary | ICD-10-CM | POA: Diagnosis not present

## 2017-01-15 DIAGNOSIS — S92502D Displaced unspecified fracture of left lesser toe(s), subsequent encounter for fracture with routine healing: Secondary | ICD-10-CM
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
## 2017-01-18 ENCOUNTER — Telehealth: Payer: Self-pay | Admitting: Nurse Practitioner

## 2017-01-18 NOTE — Telephone Encounter (Signed)
PA for Ambien started, waiting for result  ZOX:WRUEAVKey:Harvis Mateo (Key: [id number])

## 2017-01-20 NOTE — Telephone Encounter (Signed)
[employer] work from [employer] came back, Editor, commissioningcorrections made, faxed back to company. Waiting for response.

## 2017-01-21 NOTE — Telephone Encounter (Signed)
[facility] notified PA approve for generic zolpidem 5mg .

## 2017-02-16 ENCOUNTER — Other Ambulatory Visit: Payer: Self-pay

## 2017-02-16 ENCOUNTER — Encounter: Payer: Self-pay | Admitting: Physical Medicine & Rehabilitation

## 2017-02-16 ENCOUNTER — Encounter: Payer: Medicare Other | Attending: Physical Medicine & Rehabilitation | Admitting: Physical Medicine & Rehabilitation

## 2017-02-16 VITALS — BP 142/88 | HR 70

## 2017-02-16 DIAGNOSIS — M150 Primary generalized (osteo)arthritis: Secondary | ICD-10-CM | POA: Diagnosis present

## 2017-02-16 DIAGNOSIS — F5101 Primary insomnia: Secondary | ICD-10-CM

## 2017-02-16 DIAGNOSIS — G5790 Unspecified mononeuropathy of unspecified lower limb: Secondary | ICD-10-CM | POA: Diagnosis present

## 2017-02-16 DIAGNOSIS — R1030 Lower abdominal pain, unspecified: Secondary | ICD-10-CM | POA: Insufficient documentation

## 2017-02-16 DIAGNOSIS — G894 Chronic pain syndrome: Secondary | ICD-10-CM | POA: Insufficient documentation

## 2017-02-16 DIAGNOSIS — R1032 Left lower quadrant pain: Secondary | ICD-10-CM | POA: Diagnosis not present

## 2017-02-16 DIAGNOSIS — Z5181 Encounter for therapeutic drug level monitoring: Secondary | ICD-10-CM | POA: Insufficient documentation

## 2017-02-16 DIAGNOSIS — M7581 Other shoulder lesions, right shoulder: Secondary | ICD-10-CM | POA: Insufficient documentation

## 2017-02-16 DIAGNOSIS — M7918 Myalgia, other site: Secondary | ICD-10-CM

## 2017-02-16 MED ORDER — HYDROCODONE-ACETAMINOPHEN 10-325 MG PO TABS: 1.0000 | ORAL_TABLET | Freq: Two times a day (BID) | ORAL | 0 refills | Status: DC | PRN

## 2017-02-16 NOTE — Patient Instructions (Signed)
Cervical Strain and Sprain Rehab Ask your health care provider which exercises are safe for you. Do exercises exactly as told by your health care provider and adjust them as directed. It is normal to feel mild stretching, pulling, tightness, or discomfort as you do these exercises, but you should stop right away if you feel sudden pain or your pain gets worse.Do not begin these exercises until told by your health care provider. Stretching and range of motion exercises These exercises warm up your muscles and joints and improve the movement and flexibility of your neck. These exercises also help to relieve pain, numbness, and tingling. Exercise A: Cervical side bend  1. Using good posture, sit on a stable chair or stand up. 2. Without moving your shoulders, slowly tilt your left / right ear to your shoulder until you feel a stretch in your neck muscles. You should be looking straight ahead. 3. Hold for __________ seconds. 4. Repeat with the other side of your neck. Repeat __________ times. Complete this exercise __________ times a day. Exercise B: Cervical rotation  1. Using good posture, sit on a stable chair or stand up. 2. Slowly turn your head to the side as if you are looking over your left / right shoulder. ? Keep your eyes level with the ground. ? Stop when you feel a stretch along the side and the back of your neck. 3. Hold for __________ seconds. 4. Repeat this by turning to your other side. Repeat __________ times. Complete this exercise __________ times a day. Exercise C: Thoracic extension and pectoral stretch 1. Roll a towel or a small blanket so it is about 4 inches (10 cm) in diameter. 2. Lie down on your back on a firm surface. 3. Put the towel lengthwise, under your spine in the middle of your back. It should not be not under your shoulder blades. The towel should line up with your spine from your middle back to your lower back. 4. Put your hands behind your head and let your  elbows fall out to your sides. 5. Hold for __________ seconds. Repeat __________ times. Complete this exercise __________ times a day. Strengthening exercises These exercises build strength and endurance in your neck. Endurance is the ability to use your muscles for a long time, even after your muscles get tired. Exercise D: Upper cervical flexion, isometric 1. Lie on your back with a thin pillow behind your head and a small rolled-up towel under your neck. 2. Gently tuck your chin toward your chest and nod your head down to look toward your feet. Do not lift your head off the pillow. 3. Hold for __________ seconds. 4. Release the tension slowly. Relax your neck muscles completely before you repeat this exercise. Repeat __________ times. Complete this exercise __________ times a day. Exercise E: Cervical extension, isometric  1. Stand about 6 inches (15 cm) away from a wall, with your back facing the wall. 2. Place a soft object, about 6-8 inches (15-20 cm) in diameter, between the back of your head and the wall. A soft object could be a small pillow, a ball, or a folded towel. 3. Gently tilt your head back and press into the soft object. Keep your jaw and forehead relaxed. 4. Hold for __________ seconds. 5. Release the tension slowly. Relax your neck muscles completely before you repeat this exercise. Repeat __________ times. Complete this exercise __________ times a day. Posture and body mechanics  Body mechanics refers to the movements and positions of   your body while you do your daily activities. Posture is part of body mechanics. Good posture and healthy body mechanics can help to relieve stress in your body's tissues and joints. Good posture means that your spine is in its natural S-curve position (your spine is neutral), your shoulders are pulled back slightly, and your head is not tipped forward. The following are general guidelines for applying improved posture and body mechanics to  your everyday activities. Standing  When standing, keep your spine neutral and keep your feet about hip-width apart. Keep a slight bend in your knees. Your ears, shoulders, and hips should line up.  When you do a task in which you stand in one place for a long time, place one foot up on a stable object that is 2-4 inches (5-10 cm) high, such as a footstool. This helps keep your spine neutral. Sitting   When sitting, keep your spine neutral and your keep feet flat on the floor. Use a footrest, if necessary, and keep your thighs parallel to the floor. Avoid rounding your shoulders, and avoid tilting your head forward.  When working at a desk or a computer, keep your desk at a height where your hands are slightly lower than your elbows. Slide your chair under your desk so you are close enough to maintain good posture.  When working at a computer, place your monitor at a height where you are looking straight ahead and you do not have to tilt your head forward or downward to look at the screen. Resting When lying down and resting, avoid positions that are most painful for you. Try to support your neck in a neutral position. You can use a contour pillow or a small rolled-up towel. Your pillow should support your neck but not push on it. This information is not intended to replace advice given to you by your health care provider. Make sure you discuss any questions you have with your health care provider. Document Released: 03/09/2005 Document Revised: 11/14/2015 Document Reviewed: 02/13/2015 Elsevier Interactive Patient Education  2018 [employer]  

## 2017-02-16 NOTE — Progress Notes (Signed)
Subjective:    Patient ID: Shaun Lang, male    DOB: 06-20-1955, 61 y.o.   MRN: [id number]  HPI   Shaun JesterCurtis is here in follow-up of his chronic inguinal pain.  From the standpoint of the inguinal pain he is been doing fairly well.  His meloxicam as well as gabapentin and hydrocodone seem to control this pain for the most part.  He actually complained more today about some cervical pain that he has been experiencing while he is sleeping and when he first wakes up in the morning.  It has been going on for a few weeks.  It is not continuous by any means and not really even bothering him today that much.  He is tried a little bit of ice on the area but really has not done anything further.  He has not worked on a range of motion etc.  His sleep remains an off and on issue and in fact tomorrow he is seeing neurology for a sleep assessment.  He continues on Ambien for sleep.  Pain Inventory Average Pain 7 Pain Right Now 7 My pain is .  In the last 24 hours, has pain interfered with the following? General activity 8 Relation with others 8 Enjoyment of life 8 What TIME of day is your pain at its worst? morning and night Sleep (in general) Poor  Pain is worse with: bending, inactivity and some activites Pain improves with: . Relief from Meds: 7  Mobility walk without assistance how many minutes can you walk? 10 ability to climb steps?  yes do you drive?  no Do you have any goals in this area?  yes  Function disabled: date disabled .  Neuro/Psych spasms  Prior Studies Any changes since last visit?  no  Physicians involved in your care Any changes since last visit?  no   Family History  Problem Relation Age of Onset  . Diabetes Mother   . Cancer Sister        breast  . Diabetes Sister   . Stroke Sister [id number]   Social History   Socioeconomic History  . Marital status: Single    Spouse name: Not on file  . Number of children: Not on file  . Years of education: Not on file    . Highest education level: Not on file  Social Needs  . Financial resource strain: Not on file  . Food insecurity - worry: Not on file  . Food insecurity - inability: Not on file  . Transportation needs - medical: Not on file  . Transportation needs - non-medical: Not on file  Occupational History  . Not on file  Tobacco Use  . Smoking status: Current Every Day Smoker    Packs/day: 1.00    Types: Cigarettes  . Smokeless tobacco: Never Used  Substance and Sexual Activity  . Alcohol use: Yes    Comment: rare  . Drug use: No  . Sexual activity: Not on file  Other Topics Concern  . Not on file  Social History Narrative  . Not on file   Past Surgical History:  Procedure Laterality Date  . HERNIA REPAIR  04/1998   [facility]  . NECK SURGERY  07/21/02 and 01/2006  . WRIST SURGERY  1992   right   Past Medical History:  Diagnosis Date  . Bell's palsy April 2010  . Chest pain with low risk for cardiac etiology    Low Risk Myoview Sept 2013  . DJD (degenerative  joint disease)    disabilty secondary to neck issues  . Hyperlipidemia   . Hypertension   . Smoker    There were no vitals taken for this visit.  Opioid Risk Score:  3 Fall Risk Score:  `1  Depression screen PHQ 2/9  [facility] 02/16/2017 10/21/2016 08/21/2016 12/19/2015 07/03/2015 05/08/2015 12/03/2014  Decreased Interest 0 0 0 1 2 2 2   Down, Depressed, Hopeless 0 0 0 0 2 2 2   PHQ - 2 Score 0 0 0 1 4 4 4   Altered sleeping - - - - - - -  Tired, decreased energy - - - - - - -  Change in appetite - - - - - - -  Feeling bad or failure about yourself  - - - - - - -  Trouble concentrating - - - - - - -  Moving slowly or fidgety/restless - - - - - - -  Suicidal thoughts - - - - - - -  PHQ-9 Score - - - - - - -     Review of Systems  Constitutional: Negative.   HENT: Negative.   Eyes: Negative.   Respiratory: Negative.   Cardiovascular: Negative.   Gastrointestinal: Positive for constipation.  Endocrine:  Negative.   Genitourinary: Negative.   Musculoskeletal: Negative.   Skin: Negative.   Allergic/Immunologic: Negative.   Neurological: Negative.   Hematological: Negative.   Psychiatric/Behavioral: Negative.        Objective:   Physical Exam General: NAD, weight stable HEENT:PERRL Neck:Supple without JVD or lymphadenopathy  Heart:RRR Chest:CTA B  Abdomen:NT/ND.  Extremities:No clubbing, cyanosis, or edema. Pulses are 2+  Skin:Clean and intact without signs of breakdown  Neuro:Normal cognitiion. Strength 5/5.  Normal sensory function Musculoskeletal:Mild tenderness and taut musculature over the upper trapezius and sternocleidomastoid.  He had some limitations with rotation to the right as well as right lateral bending of the neck.  Cervical extension and flexion were fairly functional although flexion was a bit limited due to discomfort.  Inguinal region was unchanged Psych: pleasant and cooperative   Assessment & Plan:  1. Chronic left inguinal pain- appears to be related to his prior inguinal hernia (indirect) and mesh repair. This pain appears to be more related to scar tissue, muscle imbalance, posture, etc. Pain worsens with activity. I have concerns this could be hip joint as well  2. Left rotator cuff syndrome/bursitis and associated left AC joint arthritis  3. Mild CTS  4. Insomnia 5. Cervical myofascial pain likely as a result of poor posture and sleeping positions.  Plan:  1. HEP discussed. Provided neck ROM exercises. Discussed use of heat /ice.  2. Continue neurontin at 300mg  bid to tid. Refilled meloxicam today 15mg --use prn 3. Refilled hydrocodone today #60 for a two month rx.  We will continue the opioid monitoring program, this consists of regular clinic visits, examinations, urine drug screen, pill counts as well as use of [place] Cont[place]. 4. Advised to use mobic daily with food.  5. Insomnia---reviewed  appropriate, neutral head positioning in bed. He is seeing neurology tomorrow for a formal assessment 6. Follow up with NP in 2 months. Fifteen minutes of face to face patient care time were spent during this visit. All questions were encouraged and answered.  Greater than 50% of time during this encounter was spent counseling patient/family in regard to cervical ROM and posture. .[name]

## 2017-02-17 ENCOUNTER — Ambulatory Visit (INDEPENDENT_AMBULATORY_CARE_PROVIDER_SITE_OTHER): Payer: Medicare Other | Admitting: Neurology

## 2017-02-17 ENCOUNTER — Encounter: Payer: Self-pay | Admitting: Neurology

## 2017-02-17 VITALS — BP 124/80 | HR 69 | Ht 68.0 in | Wt 190.0 lb

## 2017-02-17 DIAGNOSIS — Z79899 Other long term (current) drug therapy: Secondary | ICD-10-CM | POA: Diagnosis not present

## 2017-02-17 DIAGNOSIS — R0683 Snoring: Secondary | ICD-10-CM | POA: Diagnosis not present

## 2017-02-17 DIAGNOSIS — Z72821 Inadequate sleep hygiene: Secondary | ICD-10-CM | POA: Insufficient documentation

## 2017-02-17 DIAGNOSIS — G4719 Other hypersomnia: Secondary | ICD-10-CM | POA: Insufficient documentation

## 2017-02-17 DIAGNOSIS — G4709 Other insomnia: Secondary | ICD-10-CM | POA: Diagnosis not present

## 2017-02-17 NOTE — Patient Instructions (Signed)
Please remember to try to maintain good sleep hygiene, which means: Keep a regular sleep and wake schedule, try not to exercise or have a meal within 2 hours of your bedtime, try to keep your bedroom conducive for sleep, that is, cool and dark, without light distractors such as an illuminated alarm clock, and refrain from watching TV right before sleep or in the middle of the night and do not keep the TV or radio on during the night. Also, try not to use or play on electronic devices at bedtime, such as your cell phone, tablet PC or laptop. If you like to read at bedtime on an electronic device, try to dim the background light as much as possible. Do not eat in the middle of the night.   We will request a sleep study.    We will look for leg twitching and snoring or sleep apnea.   For chronic insomnia, you are best followed by a psychiatrist and/or sleep psychologist.   We will call you with the sleep study results and make a follow up appointment if needed.  Hypersomnia  Hypersomnia Hypersomnia is when you feel extremely tired during the day even though you're getting plenty of sleep at night. You may need to take naps during the day, and you may also be extremely difficult to wake up when you are sleeping. What are the causes? The cause of your hypersomnia may not be known. Hypersomnia may be caused by:  Medicines.  Sleep disorders, such as narcolepsy.  Trauma or injury to your head or nervous system.  Using drugs or alcohol.  Tumors.  Medical conditions, such as depression or hypothyroidism.  Genetics.  What are the signs or symptoms? The main symptoms of hypersomnia include:  Feeling extremely tired throughout the day.  Being very difficult to wake up.  Sleeping for longer and longer periods.  Taking naps throughout the day.  Other symptoms may include:  Feeling: ? Restless. ? Annoyed. ? Anxious. ? Low energy.  Having  difficulty: ? Remembering. ? Speaking. ? Thinking.  Losing your appetite.  Experiencing hallucinations.  How is this diagnosed? Hypersomnia may be diagnosed by:  Medical history and physical exam. This will include a sleep history.  Completing sleep logs.  Tests may also be done, such as: ? Polysomnography. ? Multiple sleep latency test (MSLT).  How is this treated? There is no cure for hypersomnia, but treatment can be very effective in helping manage the condition. Treatment may include:  Lifestyle and sleeping strategies to help cope with the condition.  Stimulant medicines.  Treating any underlying causes of hypersomnia.  Follow these instructions at home:  Take medicines only as directed by your health care provider.  Schedule short naps for when you feel sleepiest during the day. Tell your employer or teachers that you have hypersomnia. You may be able to adjust your schedule to include time for naps.  Avoid drinking alcohol or caffeinated beverages.  Do not eat a heavy meal before bedtime. Eat at about the same times every day.  Do not drive or operate heavy machinery if you are sleepy.  Do not swim or go out on the water without a life jacket.  If possible, adjust your schedule so that you do not have to work or be active at night.  Keep all follow-up visits as directed by your health care provider. This is important. Contact a health care provider if:  You have new symptoms.  Your symptoms get worse. Get  help right away if: You have serious thoughts of hurting yourself or someone else. This information is not intended to replace advice given to you by your health care provider. Make sure you discuss any questions you have with your health care provider. Document Released: 02/27/2002 Document Revised: 08/15/2015 Document Reviewed: 10/12/2013 Elsevier Interactive Patient Education  [employer]2018 [employer]
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
## 2017-02-19 ENCOUNTER — Encounter: Payer: Self-pay | Admitting: Podiatry

## 2017-02-19 ENCOUNTER — Ambulatory Visit (INDEPENDENT_AMBULATORY_CARE_PROVIDER_SITE_OTHER): Payer: Medicare Other | Admitting: Podiatry

## 2017-02-19 DIAGNOSIS — S92502D Displaced unspecified fracture of left lesser toe(s), subsequent encounter for fracture with routine healing: Secondary | ICD-10-CM

## 2017-02-19 DIAGNOSIS — L600 Ingrowing nail: Secondary | ICD-10-CM | POA: Diagnosis not present

## 2017-02-19 DIAGNOSIS — M21612 Bunion of left foot: Secondary | ICD-10-CM | POA: Diagnosis not present

## 2017-02-19 DIAGNOSIS — M2012 Hallux valgus (acquired), left foot: Secondary | ICD-10-CM

## 2017-02-19 NOTE — Progress Notes (Signed)
  Subjective:  Patient ID: Shaun Lang, male    DOB: February 27, 1956,  MRN: [id number]  Chief Complaint  Patient presents with  . Toe Pain    improving pain in toe, but still haing pain on push off at MPJ   61 y.o. male returns for the above complaint.  States that he is pain is somewhat better but he is having some pain when he is trying to push off his great toe and pain on the side as well.  Also complains of a ingrown toenail to his right great toe  Objective:  There were no vitals filed for this visit. General AA&O x3. Normal mood and affect.  Vascular Pedal pulses palpable.  Neurologic Epicritic sensation grossly intact.  Dermatologic No open lesions. Skin normal texture and turgor. Hallux nail slightly incurvated at the lateral border  Orthopedic:  Palpation left first MPJ with prominent dorsomedial eminence, pain on range of motion Palpation of right hallux nail   Assessment & Plan:  Patient was evaluated and treated and all questions answered.  Bunion L Foot -Injection offered, declined. -Pre-fab orthotics offered, declined. -Tube foam dispensed.  -Discussed possible surgical intervention with patient should all conservative therapy fail.  Will hold off at this time  R Hallux ingrowing nail -Nail debrided in slant back fashion  15 minutes of face to face time were spent with the patient. >50% of this was spent on counseling and coordination of care. Specifically discussed with patient the above diagnosis and treatment plans extended conversation had with patient about surgical treatment.  Return if symptoms worsen or fail to improve.

## 2017-03-04 ENCOUNTER — Ambulatory Visit (INDEPENDENT_AMBULATORY_CARE_PROVIDER_SITE_OTHER): Payer: Medicare Other | Admitting: Neurology

## 2017-03-04 DIAGNOSIS — G4710 Hypersomnia, unspecified: Secondary | ICD-10-CM

## 2017-03-04 DIAGNOSIS — G4719 Other hypersomnia: Secondary | ICD-10-CM

## 2017-03-04 DIAGNOSIS — Z79899 Other long term (current) drug therapy: Secondary | ICD-10-CM

## 2017-03-04 DIAGNOSIS — Z72821 Inadequate sleep hygiene: Secondary | ICD-10-CM

## 2017-03-04 DIAGNOSIS — R0683 Snoring: Secondary | ICD-10-CM

## 2017-03-04 DIAGNOSIS — G4709 Other insomnia: Secondary | ICD-10-CM

## 2017-04-05 ENCOUNTER — Other Ambulatory Visit: Payer: Self-pay | Admitting: Neurology

## 2017-04-05 ENCOUNTER — Telehealth: Payer: Self-pay | Admitting: Neurology

## 2017-04-05 DIAGNOSIS — I10 Essential (primary) hypertension: Secondary | ICD-10-CM

## 2017-04-05 DIAGNOSIS — G4719 Other hypersomnia: Secondary | ICD-10-CM

## 2017-04-05 DIAGNOSIS — Z720 Tobacco use: Secondary | ICD-10-CM

## 2017-04-05 DIAGNOSIS — G4733 Obstructive sleep apnea (adult) (pediatric): Secondary | ICD-10-CM

## 2017-04-05 DIAGNOSIS — R079 Chest pain, unspecified: Secondary | ICD-10-CM

## 2017-04-05 NOTE — Procedures (Signed)
[facility] [address] [place], [place] [address] NAME: Shaun DagoCurtis Lang                                                         DOB: 1955-10-31 MEDICAL RECORD No: [id number]                                DOS: 03/29/2017, repeated Home Study for failed apnea link from 03-08-2017 .  REFERRING PHYSICIAN: Bonna Gainsharlotte Lum, N.P. STUDY PERFORMED: HST on watch pat HISTORY: [name] is a 62 year old right-handed African-American gentleman who was seen here for a sleep evaluation based on a concern that he cannot sleep through the night. While he has been told that he snores it has never been mentioned to him if he has apnea.  He does not feel that he gets short of breath at night, air hungry, or experiences any choking sensations.  He wakes up feeling thirsty with a very dry mouth. The patient is trying to quit smoking and is currently using NicoDerm patches, he is also on Lipitor, multivitamin, and Mobic for joint pain, hydrocodone acetaminophen as needed for pain, gabapentin for pain, Flexeril for spasms, Maxzide and Norvasc. He was given 14 tablets of Ambien which have not helped to keep him sleeping through the night. Epworth Sleepiness Score was endorsed at 20 points, Fatigue severity score at 35. The BMI was 28.7  STUDY RESULTS: Total Recording Time: 8 hours, 42 minutes Total Apnea/Hypopnea Index (AHI): 13.9 /hr. RDI was 15.7/hr.  Average Oxygen Saturation:  96 %, Lowest Oxygen Desaturation:  85%  Total Time Oxygen Saturation Below 89% Sp02:  0.2 minutes  Average Heart Rate:  68 bpm (40-86)   IMPRESSION: Mild sleep apnea (AHI 13.9) associated with more severe snoring, but without hypoxemia.  RECOMMENDATION: Given the very high degree of sleepiness, this degree of apnea needs to be treated with CPAP, which will also address the snoring.  I will order auto CPAP 5-15 cm water with 3 cm EPR and mask of choice, under heated humidity.  I certify that I have reviewed  the raw data recording prior to the issuance of this report in accordance with the standards of the American Academy of Sleep Medicine (AASM).  Melvyn Novasarmen Dabid Godown, M.D. 01-14 2018  Medical Director of [facility], Diplomat of the ABPN and ABSM, and accredited by the AASM

## 2017-04-05 NOTE — Telephone Encounter (Signed)
I called pt. I advised pt that Dr. Vickey Hugerohmeier reviewed their sleep study results and found that pt has sleep apnea. Dr. Vickey Hugerohmeier recommends that pt starts CPAP. I reviewed PAP compliance expectations with the pt. Pt is agreeable to starting a CPAP. I advised pt that an order will be sent to a DME, [facility], and [facility] will call the pt within about one week after they file with the pt's insurance. [facility] will show the pt how to use the machine, fit for masks, and troubleshoot the CPAP if needed. A follow up appt was made for insurance purposes with Darrol Angelarolyn Martin, NP on June 21, 2017 at 10:45 am . Pt verbalized understanding to arrive 15 minutes early and bring their CPAP. A letter with all of this information in it will be mailed to the pt as a reminder. I verified with the pt that the address we have on file is correct. Pt verbalized understanding of results. Pt had no questions at this time but was encouraged to call back if questions arise.

## 2017-04-05 NOTE — Telephone Encounter (Signed)
-----   Message from Melvyn Novasarmen Dohmeier, MD sent at 04/05/2017  9:44 AM EST ----- IMPRESSION: Mild sleep apnea (AHI 13.9) associated with more  severe snoring, but without hypoxemia ( in a smoker) .   RECOMMENDATION: Given the very high degree of sleepiness ( Epworth of 20/24 points) , this  degree of apnea needs to be treated with CPAP, which will also  address the snoring.  I will order auto CPAP 5-15 cm water with 3 cm EPR and mask of  choice, under heated humidity.

## 2017-04-15 ENCOUNTER — Encounter: Payer: Self-pay | Admitting: Registered Nurse

## 2017-04-15 ENCOUNTER — Encounter: Payer: Medicare Other | Attending: Physical Medicine & Rehabilitation | Admitting: Registered Nurse

## 2017-04-15 VITALS — BP 149/87 | HR 70

## 2017-04-15 DIAGNOSIS — G894 Chronic pain syndrome: Secondary | ICD-10-CM

## 2017-04-15 DIAGNOSIS — R1030 Lower abdominal pain, unspecified: Secondary | ICD-10-CM | POA: Insufficient documentation

## 2017-04-15 DIAGNOSIS — M150 Primary generalized (osteo)arthritis: Secondary | ICD-10-CM | POA: Insufficient documentation

## 2017-04-15 DIAGNOSIS — Z5181 Encounter for therapeutic drug level monitoring: Secondary | ICD-10-CM | POA: Diagnosis present

## 2017-04-15 DIAGNOSIS — M7581 Other shoulder lesions, right shoulder: Secondary | ICD-10-CM | POA: Insufficient documentation

## 2017-04-15 DIAGNOSIS — M47817 Spondylosis without myelopathy or radiculopathy, lumbosacral region: Secondary | ICD-10-CM

## 2017-04-15 DIAGNOSIS — R1032 Left lower quadrant pain: Secondary | ICD-10-CM

## 2017-04-15 DIAGNOSIS — Z79899 Other long term (current) drug therapy: Secondary | ICD-10-CM | POA: Diagnosis not present

## 2017-04-15 DIAGNOSIS — M7918 Myalgia, other site: Secondary | ICD-10-CM

## 2017-04-15 DIAGNOSIS — G5790 Unspecified mononeuropathy of unspecified lower limb: Secondary | ICD-10-CM | POA: Diagnosis present

## 2017-04-15 DIAGNOSIS — F5101 Primary insomnia: Secondary | ICD-10-CM | POA: Diagnosis not present

## 2017-04-15 MED ORDER — HYDROCODONE-ACETAMINOPHEN 10-325 MG PO TABS: 1.0000 | ORAL_TABLET | Freq: Two times a day (BID) | ORAL | 0 refills | Status: DC | PRN

## 2017-04-15 NOTE — Progress Notes (Signed)
Subjective:    Patient ID: Shaun Lang, male    DOB: Sep 25, 1955, 62 y.o.   MRN: [id number]  HPI: [name] is a 62year old male who returns for follow up appointmentfor chronic pain and medication refill. He states his pain is located in his neck and lower back pain. He rates his pain 8. His current exercise regime is walking. Also reports he had a sleep study awaiting to pick up his Bi-Pap machine.  [name] arrived to office hypertensive, blood pressure re-checked. [name] states he didn't take his antihypertensive, educated on compliance. He verbalizes understanding.   [name] Morphine equivalent is 20.00 MME.  Last UDS was performed on 08/21/2016. Oral Swab Performed Today.   Pain Inventory Average Pain 7 Pain Right Now 8 My pain is sharp, dull and aching  In the last 24 hours, has pain interfered with the following? General activity 8 Relation with others 8 Enjoyment of life 9 What TIME of day is your pain at its worst? morning and evening Sleep (in general) Poor  Pain is worse with: walking, bending and some activites Pain improves with: medication Relief from Meds: 5  Mobility how many minutes can you walk? 5 ability to climb steps?  yes do you drive?  no  Function retired  Neuro/Psych tingling spasms  Prior Studies Any changes since last visit?  no  Physicians involved in your care Any changes since last visit?  no   Family History  Problem Relation Age of Onset  . Diabetes Mother   . Cancer Sister        breast  . Diabetes Sister   . Stroke Sister [id number]   Social History   Socioeconomic History  . Marital status: Single    Spouse name: None  . Number of children: None  . Years of education: None  . Highest education level: None  Social Needs  . Financial resource strain: None  . Food insecurity - worry: None  . Food insecurity - inability: None  . Transportation needs - medical: None  . Transportation needs - non-medical:  None  Occupational History  . None  Tobacco Use  . Smoking status: Current Every Day Smoker    Packs/day: 1.00    Types: Cigarettes  . Smokeless tobacco: Never Used  Substance and Sexual Activity  . Alcohol use: Yes    Comment: rare  . Drug use: No  . Sexual activity: None  Other Topics Concern  . None  Social History Narrative  . None   Past Surgical History:  Procedure Laterality Date  . HERNIA REPAIR  04/1998   [facility]  . NECK SURGERY  07/21/02 and 01/2006  . WRIST SURGERY  1992   right   Past Medical History:  Diagnosis Date  . Bell's palsy April 2010  . Chest pain with low risk for cardiac etiology    Low Risk Myoview Sept 2013  . DJD (degenerative joint disease)    disabilty secondary to neck issues  . Hyperlipidemia   . Hypertension   . Smoker    BP (!) 149/87 (BP Location: Left Arm, Patient Position: Sitting, Cuff Size: Normal)   Pulse 70   SpO2 98%   Opioid Risk Score:  3 Fall Risk Score:  `1  Depression screen PHQ 2/9  Depression screen [facility] 2/[facility] 02/16/2017 62/03/2016 08/21/2016 12/19/2015 07/03/2015 05/08/2015 12/03/2014  Decreased Interest 0 0 0 1 2 2 2   Down, Depressed, Hopeless 0 0 0 0 2  2 2  PHQ - 2 Score 0 0 0 1 4 4 4   Altered sleeping - - - - - - -  Tired, decreased energy - - - - - - -  Change in appetite - - - - - - -  Feeling bad or failure about yourself  - - - - - - -  Trouble concentrating - - - - - - -  Moving slowly or fidgety/restless - - - - - - -  Suicidal thoughts - - - - - - -  PHQ-9 Score - - - - - - -    Review of Systems  Constitutional: Positive for diaphoresis.  HENT: Negative.   Eyes: Negative.   Respiratory: Positive for wheezing.   Cardiovascular: Negative.   Gastrointestinal: Positive for constipation.  Endocrine: Negative.   Genitourinary: Positive for difficulty urinating.  Musculoskeletal:       Spasm  Skin: Negative.   Allergic/Immunologic: Negative.   Neurological:       Tingling  Hematological: Negative.     Psychiatric/Behavioral: Negative.   All other systems reviewed and are negative.      Objective:   Physical Exam  Constitutional: He is oriented to person, place, and time. He appears well-developed and well-nourished.  HENT:  Head: Normocephalic and atraumatic.  Neck: Normal range of motion. Neck supple.  No Cervical Tenderness  Cardiovascular: Normal rate and regular rhythm.  Pulmonary/Chest: Effort normal and breath sounds normal.  Musculoskeletal:  Normal Muscle Bulk and Muscle Testing Reveals: Upper Extremities: Full ROM and Muscle Strength 5/5 Lumbar Paraspinal Tenderness: L-3-L-5 Lower Extremities: Full ROM and Muscle Strength 5/5 Arises from chair with ease Narrow Based Gait  Neurological: He is alert and oriented to person, place, and time.  Skin: Skin is warm and dry.  Psychiatric: He has a normal mood and affect.  Nursing note and vitals reviewed.         Assessment & Plan:  1. Chronic left inguinal pain: S/P inguinal hernia (indirect) and mesh repair. Continue to Monitor. 04/15/2017 Refilled: Hydrocodone 10/325 mg one tablet daily as needed #60. This prescription must last 60 days We will continue the opioid monitoring program, this consists of regular clinic visits, examinations, urine drug screen, pill counts as well as use of [place] Controlled Substance Reporting system. 2. Lumbosacral Spondylosis: Continue with Exercise regime. Continue to Monitor. 04/15/2017 3. Muscle Spasm: Continue Flexeril. 01/24/201 4. Chronic Pain Syndrome: Continue Hydrocodone and Mobic. 04/15/2017.   20 minutes of face to face patient care time was spent during this visit. All questions were encouraged and answered.  F/U in 2 months

## 2017-04-19 LAB — DRUG TOX MONITOR 1 W/CONF, ORAL FLD
AMPHETAMINES: NEGATIVE ng/mL (ref <10)
BARBITURATES: NEGATIVE ng/mL (ref <10)
BUPRENORPHINE: NEGATIVE ng/mL (ref <0.10)
Benzodiazepines: NEGATIVE ng/mL (ref <0.50)
COCAINE: NEGATIVE ng/mL (ref <5.0)
Codeine: NEGATIVE ng/mL (ref <2.5)
Cotinine: 151 ng/mL — ABNORMAL HIGH (ref <5.0)
DIHYDROCODEINE: 3.4 ng/mL — AB (ref <2.5)
Fentanyl: NEGATIVE ng/mL (ref <0.10)
Heroin Metabolite: NEGATIVE ng/mL (ref <1.0)
Hydrocodone: 28.6 ng/mL — ABNORMAL HIGH (ref <2.5)
Hydromorphone: NEGATIVE ng/mL (ref <2.5)
MARIJUANA: NEGATIVE ng/mL (ref <2.5)
MDMA: NEGATIVE ng/mL (ref <10)
MEPROBAMATE: NEGATIVE ng/mL (ref <2.5)
METHADONE: NEGATIVE ng/mL (ref <5.0)
MORPHINE: NEGATIVE ng/mL (ref <2.5)
NORHYDROCODONE: 2.9 ng/mL — AB (ref <2.5)
Nicotine Metabolite: POSITIVE ng/mL — AB (ref <5.0)
Noroxycodone: NEGATIVE ng/mL (ref <2.5)
Opiates: POSITIVE ng/mL — AB (ref <2.5)
Oxycodone: NEGATIVE ng/mL (ref <2.5)
Oxymorphone: NEGATIVE ng/mL (ref <2.5)
Phencyclidine: NEGATIVE ng/mL (ref <10)
TRAMADOL: NEGATIVE ng/mL (ref <5.0)
Tapentadol: NEGATIVE ng/mL (ref <5.0)
Zolpidem: NEGATIVE ng/mL (ref <5.0)

## 2017-04-19 LAB — DRUG TOX ALC METAB W/CON, ORAL FLD: ALCOHOL METABOLITE: NEGATIVE ng/mL (ref <25)

## 2017-04-21 ENCOUNTER — Telehealth: Payer: Self-pay | Admitting: *Deleted

## 2017-04-21 NOTE — Telephone Encounter (Signed)
Oral swab drug screen was consistent for prescribed medications.  ?

## 2017-05-27 ENCOUNTER — Ambulatory Visit (INDEPENDENT_AMBULATORY_CARE_PROVIDER_SITE_OTHER): Payer: Medicare Other

## 2017-05-27 ENCOUNTER — Ambulatory Visit (INDEPENDENT_AMBULATORY_CARE_PROVIDER_SITE_OTHER): Payer: Medicare Other | Admitting: Podiatry

## 2017-05-27 DIAGNOSIS — S92502D Displaced unspecified fracture of left lesser toe(s), subsequent encounter for fracture with routine healing: Secondary | ICD-10-CM

## 2017-05-27 DIAGNOSIS — M21612 Bunion of left foot: Secondary | ICD-10-CM

## 2017-05-27 DIAGNOSIS — M79671 Pain in right foot: Secondary | ICD-10-CM

## 2017-05-27 DIAGNOSIS — M2012 Hallux valgus (acquired), left foot: Secondary | ICD-10-CM | POA: Diagnosis not present

## 2017-05-27 DIAGNOSIS — M19079 Primary osteoarthritis, unspecified ankle and foot: Secondary | ICD-10-CM | POA: Diagnosis not present

## 2017-05-27 NOTE — Patient Instructions (Signed)
Pre-Operative Instructions  Congratulations, you have decided to take an important step towards improving your quality of life.  You can be assured that the doctors and staff at [facility] will be with you every step of the way.  Here are some important things you should know:  1. Plan to be at the surgery center/hospital at least 1 (one) hour prior to your scheduled time, unless otherwise directed by the surgical center/hospital staff.  You must have a responsible adult accompany you, remain during the surgery and drive you home.  Make sure you have directions to the surgical center/hospital to ensure you arrive on time. 2. If you are having surgery at [facility] or [facility] hospitals, you will need a copy of your medical history and physical form from your family physician within one month prior to the date of surgery. We will give you a form for your primary physician to complete.  3. We make every effort to accommodate the date you request for surgery.  However, there are times where surgery dates or times have to be moved.  We will contact you as soon as possible if a change in schedule is required.   4. No aspirin/ibuprofen for one week before surgery.  If you are on aspirin, any non-steroidal anti-inflammatory medications (Mobic, Aleve, Ibuprofen) should not be taken seven (7) days prior to your surgery.  You make take Tylenol for pain prior to surgery.  5. Medications - If you are taking daily heart and blood pressure medications, seizure, reflux, allergy, asthma, anxiety, pain or diabetes medications, make sure you notify the surgery center/hospital before the day of surgery so they can tell you which medications you should take or avoid the day of surgery. 6. No food or drink after midnight the night before surgery unless directed otherwise by surgical center/hospital staff. 7. No alcoholic beverages 24-hours prior to surgery.  No smoking 24-hours prior or 24-hours after  surgery. 8. Wear loose pants or shorts. They should be loose enough to fit over bandages, boots, and casts. 9. Don't wear slip-on shoes. Sneakers are preferred. 10. Bring your boot with you to the surgery center/hospital.  Also bring crutches or a walker if your physician has prescribed it for you.  If you do not have this equipment, it will be provided for you after surgery. 11. If you have not been contacted by the surgery center/hospital by the day before your surgery, call to confirm the date and time of your surgery. 12. Leave-time from work may vary depending on the type of surgery you have.  Appropriate arrangements should be made prior to surgery with your employer. 13. Prescriptions will be provided immediately following surgery by your doctor.  Fill these as soon as possible after surgery and take the medication as directed. Pain medications will not be refilled on weekends and must be approved by the doctor. 14. Remove nail polish on the operative foot and avoid getting pedicures prior to surgery. 15. Wash the night before surgery.  The night before surgery wash the foot and leg well with water and the antibacterial soap provided. Be sure to pay special attention to beneath the toenails and in between the toes.  Wash for at least three (3) minutes. Rinse thoroughly with water and dry well with a towel.  Perform this wash unless told not to do so by your physician.  Enclosed: 1 Ice pack (please put in freezer the night before surgery)   1 Hibiclens skin cleaner     Pre-op instructions  If you have any questions regarding the instructions, please do not hesitate to call our office.  [place]: [address], [place], [place] [address] -- [phone]  [place]: [address] [place], [place] [address] -- [phone]  [place]: [address]  [place], [place] [address] -- [phone]  [place]: [address], [address], [place], [place] [address] -- [phone]  Website:  [email or web address] 

## 2017-06-01 ENCOUNTER — Encounter: Payer: Medicare Other | Admitting: Registered Nurse

## 2017-06-04 ENCOUNTER — Encounter: Payer: Medicare Other | Attending: Physical Medicine & Rehabilitation | Admitting: Registered Nurse

## 2017-06-04 ENCOUNTER — Encounter: Payer: Self-pay | Admitting: Registered Nurse

## 2017-06-04 VITALS — BP 156/95 | HR 63 | Resp 14

## 2017-06-04 DIAGNOSIS — M150 Primary generalized (osteo)arthritis: Secondary | ICD-10-CM | POA: Diagnosis present

## 2017-06-04 DIAGNOSIS — M47817 Spondylosis without myelopathy or radiculopathy, lumbosacral region: Secondary | ICD-10-CM

## 2017-06-04 DIAGNOSIS — R1030 Lower abdominal pain, unspecified: Secondary | ICD-10-CM | POA: Diagnosis present

## 2017-06-04 DIAGNOSIS — M7581 Other shoulder lesions, right shoulder: Secondary | ICD-10-CM | POA: Diagnosis present

## 2017-06-04 DIAGNOSIS — Z5181 Encounter for therapeutic drug level monitoring: Secondary | ICD-10-CM

## 2017-06-04 DIAGNOSIS — Z79899 Other long term (current) drug therapy: Secondary | ICD-10-CM | POA: Diagnosis not present

## 2017-06-04 DIAGNOSIS — M7918 Myalgia, other site: Secondary | ICD-10-CM | POA: Diagnosis not present

## 2017-06-04 DIAGNOSIS — G5790 Unspecified mononeuropathy of unspecified lower limb: Secondary | ICD-10-CM | POA: Insufficient documentation

## 2017-06-04 DIAGNOSIS — M542 Cervicalgia: Secondary | ICD-10-CM | POA: Diagnosis not present

## 2017-06-04 DIAGNOSIS — G894 Chronic pain syndrome: Secondary | ICD-10-CM | POA: Diagnosis present

## 2017-06-04 DIAGNOSIS — R1032 Left lower quadrant pain: Secondary | ICD-10-CM

## 2017-06-04 MED ORDER — CYCLOBENZAPRINE HCL 10 MG PO TABS: 10.0000 mg | ORAL_TABLET | Freq: Two times a day (BID) | ORAL | 3 refills | Status: DC | PRN

## 2017-06-04 MED ORDER — HYDROCODONE-ACETAMINOPHEN 10-325 MG PO TABS
1.0000 | ORAL_TABLET | Freq: Every day | ORAL | 0 refills | Status: DC | PRN
Start: 2017-06-04 — End: 2017-06-04

## 2017-06-04 MED ORDER — HYDROCODONE-ACETAMINOPHEN 10-325 MG PO TABS: 1.0000 | ORAL_TABLET | Freq: Every day | ORAL | 0 refills | Status: DC | PRN

## 2017-06-04 NOTE — Progress Notes (Signed)
Subjective:    Patient ID: Shaun Lang, male    DOB: 11-20-1955, 62 y.o.   MRN: [id number]  HPI: [name] is a 62 year old male who returns for follow up appointmentfor chronic pain and medication refill. He states his pain is located in his neck and lower back, also reports increase intensity of lower back pain, he states his hydrocodone  gives him about 8 hours of relief and his pain is uncontrollable for the remaining 4 hours, we will increase his tablets this month and evaluate next scheduled visit. He verbalizes understanding,  . He rates his pain 8. His current exercise regime is walking.   Shaun Lang states at times he feels pulsation in his neck, he denies at this time. He will follow up with his PCP he states.   Shaun Lang Morphine equivalent is 20.00 MME.  Oral Swab was  Performed on 04/15/2017 it was consistent.  Pain Inventory Average Pain 7 Pain Right Now 8 My pain is burning, stabbing and aching  In the last 24 hours, has pain interfered with the following? General activity 4 Relation with others 3 Enjoyment of life 5 What TIME of day is your pain at its worst? daytime, night Pain is worse with: bending, inactivity and some activites Pain improves with: medication Relief from Meds: 7  Mobility walk without assistance how many minutes can you walk? 5 ability to climb steps?  yes do you drive?  no Do you have any goals in this area?  yes  Function retired  Neuro/Psych spasms dizziness anxiety  Prior Studies Any changes since last visit?  no  Physicians involved in your care Any changes since last visit?  no   Family History  Problem Relation Age of Onset  . Diabetes Mother   . Cancer Sister        breast  . Diabetes Sister   . Stroke Sister 69   Social History   Socioeconomic History  . Marital status: Single    Spouse name: None  . Number of children: None  . Years of education: None  . Highest education level: None  Social  Needs  . Financial resource strain: None  . Food insecurity - worry: None  . Food insecurity - inability: None  . Transportation needs - medical: None  . Transportation needs - non-medical: None  Occupational History  . None  Tobacco Use  . Smoking status: Current Every Day Smoker    Packs/day: 1.00    Types: Cigarettes  . Smokeless tobacco: Never Used  Substance and Sexual Activity  . Alcohol use: Yes    Comment: rare  . Drug use: No  . Sexual activity: None  Other Topics Concern  . None  Social History Narrative  . None   Past Surgical History:  Procedure Laterality Date  . HERNIA REPAIR  04/1998   LIH  . NECK SURGERY  07/21/02 and 01/2006  . WRIST SURGERY  1992   right   Past Medical History:  Diagnosis Date  . Bell's palsy April 2010  . Chest pain with low risk for cardiac etiology    Low Risk Myoview Sept 2013  . DJD (degenerative joint disease)    disabilty secondary to neck issues  . Hyperlipidemia   . Hypertension   . Smoker    There were no vitals taken for this visit.  Opioid Risk Score:  3 Fall Risk Score:  `1  Depression screen PHQ 2/9  Depression  [facility] 02/16/2017 10/21/2016 08/21/2016 12/19/2015 07/03/2015 05/08/2015 12/03/2014  Decreased Interest 0 0 0 1 2 2 2   Down, Depressed, Hopeless 0 0 0 0 2 2 2   PHQ - 2 Score 0 0 0 1 4 4 4   Altered sleeping - - - - - - -  Tired, decreased energy - - - - - - -  Change in appetite - - - - - - -  Feeling bad or failure about yourself  - - - - - - -  Trouble concentrating - - - - - - -  Moving slowly or fidgety/restless - - - - - - -  Suicidal thoughts - - - - - - -  PHQ-9 Score - - - - - - -    Review of Systems  Constitutional: Positive for diaphoresis.  HENT: Negative.   Eyes: Negative.   Respiratory: Positive for apnea.   Cardiovascular: Negative.   Gastrointestinal: Positive for constipation.  Endocrine: Negative.   Genitourinary: Negative.   Musculoskeletal: Positive for back pain, neck pain  and neck stiffness.       Spasm  Skin: Negative.   Allergic/Immunologic: Negative.   Neurological: Positive for dizziness.       Tingling  Hematological: Negative.   Psychiatric/Behavioral: The patient is nervous/anxious.   All other systems reviewed and are negative.      Objective:   Physical Exam  Constitutional: He is oriented to person, place, and time. He appears well-developed and well-nourished.  HENT:  Head: Normocephalic and atraumatic.  Neck: Normal range of motion. Neck supple.  No Cervical Tenderness  Cardiovascular: Normal rate and regular rhythm.  Pulmonary/Chest: Effort normal and breath sounds normal.  Musculoskeletal:  Normal Muscle Bulk and Muscle Testing Reveals: Upper Extremities:Full ROM and Muscle Strength 5/5 Right AC Joint Tenderenss Lumbar Paraspinal Tenderness: L-3-L-5 Lower Extremities: Full ROM and Muscle Strength 5/5 Arises from chair with ease Narrow Based Gait  Neurological: He is alert and oriented to person, place, and time.  Skin: Skin is warm and dry.  Psychiatric: He has a normal mood and affect.  Nursing note and vitals reviewed.         Assessment & Plan:  1. Chronic left inguinal pain: S/P inguinal hernia (indirect) and mesh repair. Continue to Monitor. 06/04/2017 2. Lumbosacral Spondylosis: Continue with Home Exercise regime as Tolerated. Continue to Monitor.  Refilled:Increased Hydrocodone 10/325 mg one tablet daily as needed may take an extra tablet when pain is severe 15 days out of the month #45. Second script given, due to insurance coverage two separate prescriptions were e-scribed.  We will continue the opioid monitoring program, this consists of regular clinic visits, examinations, urine drug screen, pill counts as well as use of [place] Controlled Substance Reporting system. 3. Cervicalgia: Continue HEP and alternate with Heat and Ice Therapy. Continue to Monitor. 06/04/2017 3. Muscle Spasm: Continue Flexeril.  06/04/2017 4. Chronic Pain Syndrome: Continue Hydrocodone and Continue to Monitor. 06/04/2017.   20 minutes of face to face patient care time was spent during this visit. All questions were encouraged and answered.  F/U in 2 months

## 2017-06-12 ENCOUNTER — Encounter: Payer: Self-pay | Admitting: Nurse Practitioner

## 2017-06-16 ENCOUNTER — Encounter: Payer: Medicare Other | Admitting: Podiatry

## 2017-06-16 ENCOUNTER — Ambulatory Visit: Payer: Medicare Other | Admitting: Adult Health
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
## 2017-06-20 NOTE — Progress Notes (Signed)
  Subjective:  Patient ID: Shaun Lang, male    DOB: 1956/01/14,  MRN: [id number]  Chief Complaint  Patient presents with  . Bunions    left with fracture   62 y.o. male returns for the above complaint.  States the pain from his bunion is not getting better.  Would like to discuss further options  Objective:  There were no vitals filed for this visit. General AA&O x3. Normal mood and affect.  Vascular Pedal pulses palpable.  Neurologic Epicritic sensation grossly intact.  Dermatologic No open lesions. Skin normal texture and turgor.  Orthopedic:  Pain palpation left first MPJ with prominent dorsomedial eminence, pain on range of motion   Assessment & Plan:  Patient was evaluated and treated and all questions answered.  Left foot bunion -Patient has failed conservative therapy and wishes to proceed with surgical therapy. -We will plan for correction of left foot bunion with possible double osteotomy.  All risk benefits alternatives explained to patient.  No guarantees given.  Consent reviewed and signed by patient.  We will plan for surgical date in April.  15 minutes of face to face time were spent with the patient. >50% of this was spent on counseling and coordination of care. Specifically discussed with patient the above diagnosis and treatment plan including surgical plan, postoperative plan.  Return in about 1 week (around 06/03/2017), or around 07/14/17, for post op (surgery 07/07/17).

## 2017-06-21 ENCOUNTER — Encounter: Payer: Self-pay | Admitting: Nurse Practitioner

## 2017-06-21 ENCOUNTER — Ambulatory Visit (INDEPENDENT_AMBULATORY_CARE_PROVIDER_SITE_OTHER): Payer: Medicare Other | Admitting: Nurse Practitioner

## 2017-06-21 DIAGNOSIS — Z9989 Dependence on other enabling machines and devices: Secondary | ICD-10-CM

## 2017-06-21 DIAGNOSIS — G4733 Obstructive sleep apnea (adult) (pediatric): Secondary | ICD-10-CM | POA: Diagnosis not present

## 2017-06-21 NOTE — Patient Instructions (Signed)
CPAP compliance 10% greater than 4 hours Try to increase your time on the machine every night Needs to be greater than 70% for 4 hours Contact the equipment company for any issues with the machine Follow-up in 4 months for repeat compliance Good luck with your surgery

## 2017-06-22 NOTE — Progress Notes (Signed)
I agree with the assessment and plan as directed by NP .The patient is known to me .   Kia Stavros, MD  

## 2017-06-29 ENCOUNTER — Ambulatory Visit: Payer: Medicare Other | Admitting: Registered Nurse

## 2017-07-07 ENCOUNTER — Telehealth: Payer: Self-pay | Admitting: Podiatry

## 2017-07-07 ENCOUNTER — Other Ambulatory Visit: Payer: Self-pay | Admitting: Podiatry

## 2017-07-07 DIAGNOSIS — M2012 Hallux valgus (acquired), left foot: Secondary | ICD-10-CM | POA: Diagnosis not present

## 2017-07-07 MED ORDER — CEPHALEXIN 500 MG PO CAPS
500.0000 mg | ORAL_CAPSULE | Freq: Three times a day (TID) | ORAL | 0 refills | Status: DC
Start: 2017-07-07 — End: 2017-07-15

## 2017-07-07 MED ORDER — PROMETHAZINE HCL 25 MG PO TABS: 25.0000 mg | ORAL_TABLET | Freq: Three times a day (TID) | ORAL | 0 refills | Status: DC | PRN

## 2017-07-07 MED ORDER — OXYCODONE-ACETAMINOPHEN 10-325 MG PO TABS: 1.0000 | ORAL_TABLET | ORAL | 0 refills | Status: DC | PRN

## 2017-07-07 NOTE — Progress Notes (Signed)
Rx sent to patient's pharmacy

## 2017-07-07 NOTE — Telephone Encounter (Signed)
This is [facility] calling in regards to a prescription that was sent over by Dr. Samuella CotaPrice. This is in regards to the oxycodone prescription. I just wanted to go over what he has been taking and the last fill date of 04/15 and verify that Dr. Samuella CotaPrice is aware. Please give me a call back at [phone]. Thank you.

## 2017-07-07 NOTE — Telephone Encounter (Addendum)
[facility] [name] states pt received a refill of 30 days of hydrocodone on 07/05/2017, and he wanted to know if Dr. Samuella CotaPrice was aware. I told [name] to hold the oxycodone written today, because our records show pt received hydrocodone on 06/04/2017. I will contact Dr. Samuella CotaPrice by skype and routed message.

## 2017-07-08 ENCOUNTER — Telehealth: Payer: Self-pay | Admitting: Podiatry

## 2017-07-08 ENCOUNTER — Telehealth: Payer: Self-pay

## 2017-07-08 NOTE — Telephone Encounter (Signed)
I had foot surgery yesterday and when I went to pick up my medication they told me they were waiting on the doctor to call back before I can get the narcotics I guess. I was in pain last night. I need to know what's going on and why y'all aint calling back to [facility] on [address] to get the medicine filled. Call me back at [phone]. Thank you.

## 2017-07-08 NOTE — Progress Notes (Signed)
DOS 07/07/17  Correction of bunion left foot with double osteotomy.

## 2017-07-08 NOTE — Telephone Encounter (Signed)
Spoke to patient, he is doing OK after surgery yesterday. He will keep his scheduled post op appointment next week

## 2017-07-08 NOTE — Telephone Encounter (Signed)
I informed pt I had spoken to [facility] 07/07/2017 and he had stated pt had received a months worth of Hydrocodone on 07/05/2017. I told pt the hydrocodone would help with his surgery pain.

## 2017-07-13 ENCOUNTER — Telehealth: Payer: Self-pay | Admitting: *Deleted

## 2017-07-13 NOTE — Telephone Encounter (Signed)
I spoke with pt and he asked if he could take his boot off to shower. I told pt the boot was for protection and positioning and needed to remain in place, to cover boot and shower with a shower seat would be best. Pt states understanding.

## 2017-07-13 NOTE — Telephone Encounter (Signed)
Pt states he needs to know if he can take the pad off before his appt Thursday.

## 2017-07-15 ENCOUNTER — Ambulatory Visit: Payer: Medicare Other | Admitting: Nurse Practitioner

## 2017-07-15 ENCOUNTER — Ambulatory Visit (INDEPENDENT_AMBULATORY_CARE_PROVIDER_SITE_OTHER): Payer: Medicare Other

## 2017-07-15 ENCOUNTER — Encounter: Payer: Self-pay | Admitting: Podiatry

## 2017-07-15 ENCOUNTER — Ambulatory Visit (INDEPENDENT_AMBULATORY_CARE_PROVIDER_SITE_OTHER): Payer: Medicare Other | Admitting: Nurse Practitioner

## 2017-07-15 ENCOUNTER — Encounter: Payer: Medicare Other | Admitting: Podiatry

## 2017-07-15 ENCOUNTER — Ambulatory Visit (INDEPENDENT_AMBULATORY_CARE_PROVIDER_SITE_OTHER): Payer: Medicare Other | Admitting: Podiatry

## 2017-07-15 ENCOUNTER — Encounter: Payer: Self-pay | Admitting: Nurse Practitioner

## 2017-07-15 VITALS — BP 147/95 | HR 78 | Temp 98.0°F

## 2017-07-15 VITALS — BP 142/86 | HR 74 | Temp 97.8°F | Ht 68.0 in | Wt 192.0 lb

## 2017-07-15 DIAGNOSIS — M21612 Bunion of left foot: Secondary | ICD-10-CM

## 2017-07-15 DIAGNOSIS — S92502D Displaced unspecified fracture of left lesser toe(s), subsequent encounter for fracture with routine healing: Secondary | ICD-10-CM | POA: Diagnosis not present

## 2017-07-15 DIAGNOSIS — Z1211 Encounter for screening for malignant neoplasm of colon: Secondary | ICD-10-CM | POA: Diagnosis not present

## 2017-07-15 DIAGNOSIS — Z86010 Personal history of colonic polyps: Secondary | ICD-10-CM

## 2017-07-15 DIAGNOSIS — M2012 Hallux valgus (acquired), left foot: Secondary | ICD-10-CM

## 2017-07-15 DIAGNOSIS — I10 Essential (primary) hypertension: Secondary | ICD-10-CM

## 2017-07-15 DIAGNOSIS — E782 Mixed hyperlipidemia: Secondary | ICD-10-CM | POA: Diagnosis not present

## 2017-07-15 HISTORY — PX: BUNIONECTOMY: SHX129

## 2017-07-15 LAB — COMPREHENSIVE METABOLIC PANEL
ALK PHOS: 63 U/L (ref 39–117)
ALT: 30 U/L (ref 0–53)
AST: 21 U/L (ref 0–37)
Albumin: 4.4 g/dL (ref 3.5–5.2)
BUN: 23 mg/dL (ref 6–23)
CALCIUM: 9.6 mg/dL (ref 8.4–10.5)
CO2: 28 meq/L (ref 19–32)
Chloride: 103 mEq/L (ref 96–112)
Creatinine, Ser: 1.16 mg/dL (ref 0.40–1.50)
GFR: 82 mL/min (ref >60.00)
GLUCOSE: 97 mg/dL (ref 70–99)
Potassium: 4.3 mEq/L (ref 3.5–5.1)
Sodium: 137 mEq/L (ref 135–145)
TOTAL PROTEIN: 7.3 g/dL (ref 6.0–8.3)
Total Bilirubin: 0.3 mg/dL (ref 0.2–1.2)

## 2017-07-15 LAB — LIPID PANEL
CHOL/HDL RATIO: 3
Cholesterol: 176 mg/dL (ref 0–200)
HDL: 54 mg/dL (ref >39.00)
LDL Cholesterol: 105 mg/dL — ABNORMAL HIGH (ref 0–99)
NONHDL: 122.4
TRIGLYCERIDES: 88 mg/dL (ref 0.0–149.0)
VLDL: 17.6 mg/dL (ref 0.0–40.0)

## 2017-07-15 LAB — CBC
HCT: 40 % (ref 39.0–52.0)
Hemoglobin: 13.2 g/dL (ref 13.0–17.0)
MCHC: 33.1 g/dL (ref 30.0–36.0)
MCV: 87.5 fl (ref 78.0–100.0)
PLATELETS: 156 10*3/uL (ref 150.0–400.0)
RBC: 4.57 Mil/uL (ref 4.22–5.81)
RDW: 14.3 % (ref 11.5–15.5)
WBC: 3 10*3/uL — AB (ref 4.0–10.5)

## 2017-07-15 NOTE — Progress Notes (Signed)
Subjective:  Patient ID: Shaun Lang, male    DOB: 1955-10-21  Age: 62 y.o. MRN: [id number]  CC: Follow-up (F/U for HTN, has some issues with his neck as it vibrates??) and Back Pain (back has been getting stuck when he bends over.)  HPI  HTN: Stable BP, but not at goal. Noncompliant with CPAP use and diet. Current use of maxzide. Current tobacco use. BP Readings from Last 3 Encounters:  07/15/17 (!) 142/86  06/21/17 (!) 159/94  06/04/17 (!) 156/95    Chronic neck and back pain: Managed by dr. Maisie Fus with hydrocodone, meloxicam, gabapentin, and flexeril.  Outpatient Medications Prior to Visit  Medication Sig Dispense Refill  . cyclobenzaprine (FLEXERIL) 10 MG tablet Take 1 tablet (10 mg total) by mouth 2 (two) times daily as needed for muscle spasms. 60 tablet 3  . gabapentin (NEURONTIN) 300 MG capsule Take 300 mg by mouth at bedtime as needed (pain). Bid to tid    . HYDROcodone-acetaminophen (NORCO) 10-325 MG tablet Take 1 tablet by mouth daily as needed (pain). May Take an extra tablet when pain is moderate to sever, 15 days out of the month 45 tablet 0  . meloxicam (MOBIC) 15 MG tablet Take 1 tablet (15 mg total) by mouth daily as needed for pain. 30 tablet 3  . triamterene-hydrochlorothiazide (MAXZIDE-25) 37.5-25 MG tablet Take 1 tablet by mouth daily. 90 tablet 1  . hydrocortisone cream 1 % Apply 1 application topically 3 (three) times daily as needed for itching.    . Multiple Vitamin (MULTIVITAMIN WITH MINERALS) TABS tablet Take 1 tablet by mouth daily.    . nicotine (NICODERM CQ - DOSED IN MG/24 HOURS) 21 mg/24hr patch Place 21 mg onto the skin daily.    [name] zolpidem (AMBIEN) 5 MG tablet Take 1-2 tablets (5-10 mg) by mouth at bedtime as needed for sleep. 30 tablet 1  . atorvastatin (LIPITOR) 40 MG tablet Take 40 mg by mouth daily.    . cephALEXin (KEFLEX) 500 MG capsule Take 1 capsule (500 mg total) by mouth 3 (three) times daily. (Patient not taking: Reported on 07/15/2017)  21 capsule 0  . mupirocin ointment (BACTROBAN) 2 % Apply 1 application topically 3 (three) times daily. Apply after warm soak for 10 minutes (Patient not taking: Reported on 07/15/2017) 22 g 0  . oxyCODONE-acetaminophen (PERCOCET) 10-325 MG tablet Take 1 tablet by mouth every 4 (four) hours as needed for pain. (Patient not taking: Reported on 07/15/2017) 20 tablet 0  . promethazine (PHENERGAN) 25 MG tablet Take 1 tablet (25 mg total) by mouth every 8 (eight) hours as needed for nausea or vomiting. (Patient not taking: Reported on 07/15/2017) 20 tablet 0   No facility-administered medications prior to visit.     ROS See HPI  Objective:  BP (!) 142/86 (BP Location: Left Arm, Patient Position: Sitting, Cuff Size: Normal)   Pulse 74   Temp 97.8 F (36.6 C) (Oral)   Ht 5\' 8"  (1.727 m)   Wt 192 lb (87.1 kg)   SpO2 96%   BMI 29.19 kg/m   BP Readings from Last 3 Encounters:  07/15/17 (!) 147/95  07/15/17 (!) 142/86  06/21/17 (!) 159/94    Wt Readings from Last 3 Encounters:  07/15/17 192 lb (87.1 kg)  06/21/17 195 lb (88.5 kg)  02/17/17 190 lb (86.2 kg)    Physical Exam  Constitutional: He is oriented to person, place, and time. No distress.  Neck: Normal range of motion. Neck supple. No  JVD present. No thyromegaly present.  Cardiovascular: Normal rate, regular rhythm and intact distal pulses.  Pulses:      Carotid pulses are 0 on the right side, and 0 on the left side. Unable to assess distal pulse on left LE due to presence of cast.  Pulmonary/Chest: Effort normal and breath sounds normal.  Musculoskeletal: He exhibits no edema.  Neurological: He is alert and oriented to person, place, and time.  Vitals reviewed.   Lab Results  Component Value Date   WBC 3.0 (L) 07/15/2017   HGB 13.2 07/15/2017   HCT 40.0 07/15/2017   PLT 156.0 07/15/2017   GLUCOSE 97 07/15/2017   CHOL 176 07/15/2017   TRIG 88.0 07/15/2017   HDL 54.00 07/15/2017   LDLCALC 105 (H) 07/15/2017   ALT 30  07/15/2017   AST 21 07/15/2017   NA 137 07/15/2017   K 4.3 07/15/2017   CL 103 07/15/2017   CREATININE 1.16 07/15/2017   BUN 23 07/15/2017   CO2 28 07/15/2017   TSH 0.64 10/30/2016   PSA 0.6 12/19/2015   HGBA1C 5.6 12/19/2015    Dg Toe Great Left  Result Date: 01/14/2017 CLINICAL DATA:  Injury. Pain with weight-bearing. Limited range of motion. EXAM: LEFT GREAT TOE COMPARISON:  None. FINDINGS: There is a small chip fracture at the base of the proximal phalanx of the great toe, adjacent to the head of the second metatarsal. This is minimally displaced. There is soft tissue swelling. IMPRESSION: Small chip fracture at the base of the proximal phalanx great toe. Electronically Signed   By: Elsie StainJohn T Curnes M.D.   On: 01/14/2017 11:27    Assessment & Plan:   Shaun JesterCurtis was seen today for follow-up and back pain.  Diagnoses and all orders for this visit:  Essential hypertension -     CBC -     Comprehensive metabolic panel -     triamterene-hydrochlorothiazide (MAXZIDE-25) 37.5-25 MG tablet; Take 1 tablet by mouth daily. -     lisinopril (PRINIVIL,ZESTRIL) 5 MG tablet; Take 1 tablet (5 mg total) by mouth daily.  Mixed hyperlipidemia -     Comprehensive metabolic panel -     Lipid panel -     atorvastatin (LIPITOR) 40 MG tablet; Take 1 tablet (40 mg total) by mouth daily at 6 PM.  Colon cancer screening -     Ambulatory referral to Gastroenterology  Hx of colonic polyp -     Ambulatory referral to Gastroenterology   I have discontinued Shaun Lang's mupirocin ointment, oxyCODONE-acetaminophen, cephALEXin, and promethazine. I have also changed his atorvastatin. Additionally, I am having him start on lisinopril. Lastly, I am having him maintain his gabapentin, hydrocortisone cream, multivitamin with minerals, nicotine, meloxicam, zolpidem, cyclobenzaprine, HYDROcodone-acetaminophen, and triamterene-hydrochlorothiazide.  Meds ordered this encounter  Medications  . atorvastatin  (LIPITOR) 40 MG tablet    Sig: Take 1 tablet (40 mg total) by mouth daily at 6 PM.    Dispense:  90 tablet    Refill:  1    Order Specific Question:   Supervising Provider    Answer:   MATTHEWS, CODY [[id number]]  . triamterene-hydrochlorothiazide (MAXZIDE-25) 37.5-25 MG tablet    Sig: Take 1 tablet by mouth daily.    Dispense:  90 tablet    Refill:  1    Order Specific Question:   Supervising Provider    Answer:   MATTHEWS, CODY [[id number]]  . lisinopril (PRINIVIL,ZESTRIL) 5 MG tablet    Sig: Take 1 tablet (5 mg  total) by mouth daily.    Dispense:  90 tablet    Refill:  1    Order Specific Question:   Supervising Provider    Answer:   MATTHEWS, CODY [[id number]]    Follow-up: Return in about 6 months (around 01/14/2018) for HTN and , hyperlipidemia (fasting).  Alysia Penna, NP

## 2017-07-15 NOTE — Patient Instructions (Addendum)
Sign medical release to get records from [facility].  Stable lab results Medication refill sent  Advise about importance of compliance with DASH diet and CPAP use.

## 2017-07-15 NOTE — Assessment & Plan Note (Addendum)
Stable BP, but not at goal. Noncompliant with CPAP use and diet. Current use of maxzide. Current tobacco use. BP Readings from Last 3 Encounters:  07/15/17 (!) 142/86  06/21/17 (!) 159/94  06/04/17 (!) 156/95   Added lisinopril 5mg 

## 2017-07-16 ENCOUNTER — Encounter: Payer: Self-pay | Admitting: Nurse Practitioner

## 2017-07-16 MED ORDER — TRIAMTERENE-HCTZ 37.5-25 MG PO TABS: 1.0000 | ORAL_TABLET | Freq: Every day | ORAL | 1 refills | Status: DC

## 2017-07-16 MED ORDER — ATORVASTATIN CALCIUM 40 MG PO TABS: 40.0000 mg | ORAL_TABLET | Freq: Every day | ORAL | 1 refills | Status: DC

## 2017-07-16 MED ORDER — LISINOPRIL 5 MG PO TABS: 5.0000 mg | ORAL_TABLET | Freq: Every day | ORAL | 1 refills | Status: DC

## 2017-07-16 NOTE — Assessment & Plan Note (Signed)
Continue lipitor. Lipid Panel     Component Value Date/Time   CHOL 176 07/15/2017 1053   TRIG 88.0 07/15/2017 1053   HDL 54.00 07/15/2017 1053   CHOLHDL 3 07/15/2017 1053   VLDL 17.6 07/15/2017 1053   LDLCALC 105 (H) 07/15/2017 1053

## 2017-07-16 NOTE — Progress Notes (Signed)
  Subjective:  Patient ID: Shaun Lang, male    DOB: 1955/05/11,  MRN: [id number]  Chief Complaint  Patient presents with  . Routine [facility]    dos 04.17.2019 Double Osteotomy Lt " my foot feels fine"     DOS: 07/07/17 Procedure: Raoul PitchL Austin Bunionectomy  62 y.o. male returns for post-op check. Denies N/V/F/Ch. Pain is controlled with current medications.  Objective:   General AA&O x3. Normal mood and affect.  Vascular Foot warm and well perfused.  Neurologic Gross sensation intact.  Dermatologic Skin healing well without signs of infection. Skin edges well coapted without signs of infection.  Orthopedic: Tenderness to palpation noted about the surgical site.    Assessment & Plan:  Patient was evaluated and treated and all questions answered.  S/p Austin Bunionectomy L -X-rays taken reviewed consistent with postop state. -Progressing as expected post-operatively. -Sutures: intact. -Medications refilled: none -Foot redressed.  No follow-ups on file.

## 2017-07-23 ENCOUNTER — Ambulatory Visit (INDEPENDENT_AMBULATORY_CARE_PROVIDER_SITE_OTHER): Payer: Medicare Other | Admitting: Podiatry

## 2017-07-23 DIAGNOSIS — Z9889 Other specified postprocedural states: Secondary | ICD-10-CM

## 2017-07-29 ENCOUNTER — Encounter: Payer: Medicare Other | Attending: Physical Medicine & Rehabilitation | Admitting: Registered Nurse

## 2017-07-29 ENCOUNTER — Encounter: Payer: Self-pay | Admitting: Registered Nurse

## 2017-07-29 ENCOUNTER — Other Ambulatory Visit: Payer: Self-pay

## 2017-07-29 ENCOUNTER — Ambulatory Visit (INDEPENDENT_AMBULATORY_CARE_PROVIDER_SITE_OTHER): Payer: Medicare Other | Admitting: Podiatry

## 2017-07-29 VITALS — BP 117/80 | HR 86 | Ht 68.0 in | Wt 188.8 lb

## 2017-07-29 DIAGNOSIS — Z79899 Other long term (current) drug therapy: Secondary | ICD-10-CM

## 2017-07-29 DIAGNOSIS — Z5181 Encounter for therapeutic drug level monitoring: Secondary | ICD-10-CM | POA: Diagnosis not present

## 2017-07-29 DIAGNOSIS — R1032 Left lower quadrant pain: Secondary | ICD-10-CM

## 2017-07-29 DIAGNOSIS — R1030 Lower abdominal pain, unspecified: Secondary | ICD-10-CM | POA: Insufficient documentation

## 2017-07-29 DIAGNOSIS — Z9889 Other specified postprocedural states: Secondary | ICD-10-CM

## 2017-07-29 DIAGNOSIS — M150 Primary generalized (osteo)arthritis: Secondary | ICD-10-CM | POA: Diagnosis present

## 2017-07-29 DIAGNOSIS — M7918 Myalgia, other site: Secondary | ICD-10-CM | POA: Diagnosis not present

## 2017-07-29 DIAGNOSIS — G894 Chronic pain syndrome: Secondary | ICD-10-CM | POA: Diagnosis present

## 2017-07-29 DIAGNOSIS — M7581 Other shoulder lesions, right shoulder: Secondary | ICD-10-CM | POA: Diagnosis present

## 2017-07-29 DIAGNOSIS — M47817 Spondylosis without myelopathy or radiculopathy, lumbosacral region: Secondary | ICD-10-CM

## 2017-07-29 DIAGNOSIS — G5790 Unspecified mononeuropathy of unspecified lower limb: Secondary | ICD-10-CM | POA: Diagnosis present

## 2017-07-29 MED ORDER — HYDROCODONE-ACETAMINOPHEN 10-325 MG PO TABS: 1.0000 | ORAL_TABLET | Freq: Every day | ORAL | 0 refills | Status: DC | PRN

## 2017-07-29 MED ORDER — HYDROCODONE-ACETAMINOPHEN 10-325 MG PO TABS
1.0000 | ORAL_TABLET | Freq: Every day | ORAL | 0 refills | Status: DC | PRN
Start: 2017-07-29 — End: 2017-07-29

## 2017-07-29 NOTE — Progress Notes (Signed)
Subjective:    Patient ID: Shaun Lang, male    DOB: [birth year], 62 y.o.   MRN: [id number]  HPI: [name] is a 62 year old male who returns for follow up appointment for chronic pain and medication refill. He states his pain is located in his lower back and left foot. He rates his pain 7. His current exercise regime is walking short distances.   Shaun Lang Morphine Equivalent is 14.50 MME.  Last Oral Swab was Performed on 04/15/2017, it was consistent.    Shaun Lang had Left Bunionectomy by Dr. Samuella Cota, wearing open toe shoe.   Pain Inventory Average Pain 7 Pain Right Now 7 My pain is sharp, burning, stabbing and aching  In the last 24 hours, has pain interfered with the following? General activity 8 Relation with others 9 Enjoyment of life 7 What TIME of day is your pain at its worst? daytime evening night Sleep (in general) Poor  Pain is worse with: walking, bending, inactivity and standing Pain improves with: medication Relief from Meds: 6  Mobility walk with assistance use a cane do you drive?  no Do you have any goals in this area?  yes  Function disabled: date disabled n/a  Neuro/Psych spasms  Prior Studies Any changes since last visit?  no  Physicians involved in your care Any changes since last visit?  no   Family History  Problem Relation Age of Onset  . Diabetes Mother   . Cancer Sister        breast  . Diabetes Sister   . Stroke Sister 45   Social History   Socioeconomic History  . Marital status: Single    Spouse name: Not on file  . Number of children: Not on file  . Years of education: Not on file  . Highest education level: Not on file  Occupational History  . Not on file  Social Needs  . Financial resource strain: Not on file  . Food insecurity:    Worry: Not on file    Inability: Not on file  . Transportation needs:    Medical: Not on file    Non-medical: Not on file  Tobacco Use  . Smoking status: Current Every Day  Smoker    Packs/day: 1.00    Types: Cigarettes  . Smokeless tobacco: Never Used  Substance and Sexual Activity  . Alcohol use: Yes    Comment: rare  . Drug use: No  . Sexual activity: Not on file  Lifestyle  . Physical activity:    Days per week: Not on file    Minutes per session: Not on file  . Stress: Not on file  Relationships  . Social connections:    Talks on phone: Not on file    Gets together: Not on file    Attends religious service: Not on file    Active member of club or organization: Not on file    Attends meetings of clubs or organizations: Not on file    Relationship status: Not on file  Other Topics Concern  . Not on file  Social History Narrative  . Not on file   Past Surgical History:  Procedure Laterality Date  . HERNIA REPAIR  04/1998   LIH  . NECK SURGERY  07/21/02 and 01/2006  . WRIST SURGERY  1992   right   Past Medical History:  Diagnosis Date  . Bell's palsy April 2010  . Chest pain with low risk for  cardiac etiology    Low Risk Myoview Sept 2013  . DJD (degenerative joint disease)    disabilty secondary to neck issues  . Hyperlipidemia   . Hypertension   . Smoker    BP 117/80   Pulse 86   Ht  (1.727 m) Comment: pt reported  Wt 188 lb 12.8 oz (85.6 kg)   SpO2 96%   BMI 28.71 kg/m   Opioid Risk Score:   Fall Risk Score:  `1  Depression screen PHQ 2/9  Depression screen [facility] 2/9 07/29/2017 02/16/2017 10/21/2016 08/21/2016 12/19/2015 07/03/2015 05/08/2015  Decreased Interest 0 0 0 0 Down, Depressed, Hopeless 0 0 0 0 0 2 2  PHQ - 2 Score 0 0 0 0 Altered sleeping - - - - - - -  Tired, decreased energy - - - - - - -  Change in appetite - - - - - - -  Feeling bad or failure about yourself  - - - - - - -  Trouble concentrating - - - - - - -  Moving slowly or fidgety/restless - - - - - - -  Suicidal thoughts - - - - - - -  PHQ-9 Score - - - - - - -    Review of Systems  Constitutional:       Night sweats  HENT:  Negative.   Eyes: Negative.   Respiratory: Positive for apnea and cough.   Cardiovascular: Negative.   Gastrointestinal: Negative.   Endocrine: Negative.   Genitourinary: Negative.   Musculoskeletal: Negative.   Skin: Negative.   Allergic/Immunologic: Negative.   Neurological: Negative.   Hematological: Negative.   Psychiatric/Behavioral: Negative.   All other systems reviewed and are negative.      Objective:   Physical Exam  Constitutional: He is oriented to person, place, and time. He appears well-developed and well-nourished.  HENT:  Head: Normocephalic and atraumatic.  Neck: Normal range of motion. Neck supple.  Cardiovascular: Normal rate and regular rhythm.  Pulmonary/Chest: Effort normal and breath sounds normal.  Musculoskeletal:  Normal Muscle Bulk and Muscle Testing Reveals: Upper Extremities: Full ROM and Muscle Strength 5/5 Back without spinal tenderness  Lower Extremities: Full ROM and Muscle Strength 5/5 Arises from Table with ease Narrow Based gait  Neurological: He is alert and oriented to person, place, and time.  Skin: Skin is warm and dry.  Psychiatric: He has a normal mood and affect.  Nursing note and vitals reviewed.         Assessment & Plan:  1. Chronic left inguinal pain: S/P inguinal hernia (indirect) and mesh repair. Continue to Monitor. 07/29/2017 Refilled: Hydrocodone 10/325 mg one tablet daily as needed, may take and extra tablet when pain is moderate to sever 15 days out of the month. # 45. Second script sent for the following month.  We will continue the opioid monitoring program, this consists of regular clinic visits, examinations, urine drug screen, pill counts as well as use of [place] Controlled Substance Reporting system. 2. Lumbosacral Spondylosis: Continue with Exercise regime. Continue to Monitor. 07/29/2017 3. Muscle Spasm: Continue current medication regimen with Flexeril. 07/29/2017 4. Chronic Pain Syndrome: Continue  Hydrocodone. 07/29/2017.   20 minutes of face to face patient care time was spent during this visit. All questions were encouraged and answered.  F/U in 2 months

## 2017-08-12 ENCOUNTER — Ambulatory Visit: Payer: Medicare Other | Admitting: Podiatry

## 2017-08-19 ENCOUNTER — Ambulatory Visit (INDEPENDENT_AMBULATORY_CARE_PROVIDER_SITE_OTHER): Payer: Medicare Other | Admitting: Podiatry

## 2017-08-19 DIAGNOSIS — Z9889 Other specified postprocedural states: Secondary | ICD-10-CM

## 2017-08-19 NOTE — Progress Notes (Signed)
  Subjective:  Patient ID: Shaun Lang, male    DOB: 1955-05-30,  MRN: [id number]  No chief complaint on file.   DOS: 07/07/17 Procedure: Raoul Pitch Bunionectomy  62 y.o. male returns for post-op check. Denies N/V/F/Ch. Doing PT, reports R great toe stiffness.  Objective:   General AA&O x3. Normal mood and affect.  Vascular Foot warm and well perfused.  Neurologic Gross sensation intact.  Dermatologic Skin healing well without signs of infection. Skin edges well coapted without signs of infection.  Orthopedic: Tenderness to palpation noted about the surgical site. R 1st MPJ ROM reduced.    Assessment & Plan:  Patient was evaluated and treated and all questions answered.  S/p Austin Bunionectomy L -Transition to normal shoegear -Continue PT, work on ROM. -F/u in 1 month for recheck with new XRs.  Return in about 1 month (around 09/19/2017).

## 2017-08-20 NOTE — Progress Notes (Signed)
  Subjective:  Patient ID: Shaun Lang, male    DOB: 1955/09/03,  MRN: [id number]  Chief Complaint  Patient presents with  . Routine Post Op    left foot fracture - feeling better   DOS: 07/07/17 Procedure: Raoul Pitch Bunionectomy  62 y.o. male returns for post-op check. Denies N/V/F/Ch. Pain is controlled with current medications.  Doing well  Objective:   General AA&O x3. Normal mood and affect.  Vascular Foot warm and well perfused.  Neurologic Gross sensation intact.  Dermatologic Skin well-healed  Orthopedic: Tenderness to palpation noted about the surgical site.    Assessment & Plan:  Patient was evaluated and treated and all questions answered.  S/p Austin Bunionectomy L -X-rays taken reviewed consistent with postop state. -Progressing as expected post-operatively. -Medications refilled: none -Foot redressed. -Transition to surgical shoe  No follow-ups on file.

## 2017-08-20 NOTE — Progress Notes (Signed)
  Subjective:  Patient ID: Shaun Lang, male    DOB: 12-07-1955,  MRN: [id number]  No chief complaint on file.  DOS: 07/07/17 Procedure: Raoul Pitch Bunionectomy  62 y.o. male returns for post-op check. Denies N/V/F/Ch. Pain is controlled with current medications.  Doing well  Objective:   General AA&O x3. Normal mood and affect.  Vascular Foot warm and well perfused.  Neurologic Gross sensation intact.  Dermatologic Skin well-healed  Orthopedic: Tenderness to palpation noted about the surgical site.    Assessment & Plan:  Patient was evaluated and treated and all questions answered.  S/p Austin Bunionectomy L -X-rays taken reviewed consistent with postop state. -Progressing as expected post-operatively. -Sutures: Removed. -Medications refilled: none -Foot redressed. -Refer to therapy  Return in about 2 weeks (around 08/06/2017) for Post-op.

## 2017-08-31 ENCOUNTER — Telehealth: Payer: Self-pay | Admitting: Nurse Practitioner

## 2017-08-31 NOTE — Telephone Encounter (Signed)
Copied from CRM [phone]. Topic: Referral - Status >> Aug 26, 2017 12:24 PM Trula SladeWalter, Linda F wrote: Reason for CRM:   Patient would like to know the status of his colonoscopy referral  >> Aug 31, 2017  3:53 PM Marcha SoldersWalker, Monica D wrote: Left message on patient voicemail. Per [facility], there office has not received records from [facility] and is unable to schedule until records are received and scanned into chart. I left [facility] office phone number and our office number, if patient has any questions and concerns.

## 2017-08-31 NOTE — Telephone Encounter (Signed)
Called [facility] [phone] to check on the status of the record request we faxed over on 08/25/17, she said they received it and sent to [facility] to be process.   Called [facility] [phone] check on th status, rep said their system is down and might now be back until 09/03/17, she got all information about this and will work on it as soon as they have access to [facility] system.

## 2017-09-02 ENCOUNTER — Telehealth: Payer: Self-pay | Admitting: Gastroenterology

## 2017-09-02 NOTE — Telephone Encounter (Signed)
Received referral for patient to have next colon at this office. Records scanned into Epic for previous colon on 4.28.2014 with [facility]. Patient wants all [place] docs but is not requesting certain doc for office. DOD for referral date 4.25.19 is Dr.Gupta. Please review procedure and path report and advise on scheduling.

## 2017-09-03 NOTE — Telephone Encounter (Signed)
Have reviewed the previous colonoscopy report. Okay to schedule him for colonoscopy with a 2-day preparation.  Please use MiraLAX and Suprep.  I do not see any contraindication for LEC.

## 2017-09-15 NOTE — Telephone Encounter (Signed)
Per Epic note [facility] - 09/02/17, records have been received and review for there office to proceed with referral. They will call patient directly to schedule.

## 2017-09-16 ENCOUNTER — Encounter: Payer: Self-pay | Admitting: Podiatry

## 2017-09-16 ENCOUNTER — Encounter: Payer: Self-pay | Admitting: Gastroenterology

## 2017-09-16 ENCOUNTER — Ambulatory Visit (INDEPENDENT_AMBULATORY_CARE_PROVIDER_SITE_OTHER): Payer: Medicare Other | Admitting: Podiatry

## 2017-09-16 DIAGNOSIS — Z9889 Other specified postprocedural states: Secondary | ICD-10-CM

## 2017-09-16 NOTE — Progress Notes (Signed)
  Subjective:  Patient ID: Norvel RichardsCurtis L Perkin, male    DOB: 09/10/1955,  MRN: [id number]  Chief Complaint  Patient presents with  . Routine Post Op    DOS (07/07/17) s/p bunion surgery; pt stated,  "doing good but still have little pain on bottom under big toe when walking; still unable to bend toe all the way"    DOS: 07/07/17 Procedure: Raoul PitchL Austin Bunionectomy  62 y.o. male returns for post-op check. Denies N/V/F/Ch.  Completed PT still has some stiffness in the toe and a little pain on the big toe.  Objective:   General AA&O x3. Normal mood and affect.  Vascular Foot warm and well perfused.  Neurologic Gross sensation intact.  Dermatologic Skin healing well without signs of infection. Skin edges well coapted without signs of infection.  Orthopedic: No tenderness to palpation noted about the surgical site. R 1st MPJ ROM reduced.    Assessment & Plan:  Patient was evaluated and treated and all questions answered.  S/p Austin Bunionectomy L -Continue normal shoegear -Encouraged continued ROM exercises.  Return in about 2 months (around 11/16/2017) for Post-op.

## 2017-09-20 NOTE — Telephone Encounter (Signed)
Patient was [place]

## 2017-09-28 ENCOUNTER — Ambulatory Visit: Payer: Medicare Other | Admitting: Physical Medicine & Rehabilitation

## 2017-09-29 ENCOUNTER — Encounter: Payer: Self-pay | Admitting: Physical Medicine & Rehabilitation

## 2017-09-29 ENCOUNTER — Encounter: Payer: Medicare Other | Attending: Physical Medicine & Rehabilitation | Admitting: Physical Medicine & Rehabilitation

## 2017-09-29 VITALS — BP 145/89 | HR 65 | Resp 14 | Ht 68.0 in | Wt 189.0 lb

## 2017-09-29 DIAGNOSIS — Z5181 Encounter for therapeutic drug level monitoring: Secondary | ICD-10-CM | POA: Insufficient documentation

## 2017-09-29 DIAGNOSIS — Z79891 Long term (current) use of opiate analgesic: Secondary | ICD-10-CM | POA: Diagnosis not present

## 2017-09-29 DIAGNOSIS — R1032 Left lower quadrant pain: Secondary | ICD-10-CM

## 2017-09-29 DIAGNOSIS — M150 Primary generalized (osteo)arthritis: Secondary | ICD-10-CM | POA: Insufficient documentation

## 2017-09-29 DIAGNOSIS — M7581 Other shoulder lesions, right shoulder: Secondary | ICD-10-CM | POA: Insufficient documentation

## 2017-09-29 DIAGNOSIS — R1030 Lower abdominal pain, unspecified: Secondary | ICD-10-CM | POA: Diagnosis present

## 2017-09-29 DIAGNOSIS — M47817 Spondylosis without myelopathy or radiculopathy, lumbosacral region: Secondary | ICD-10-CM | POA: Diagnosis not present

## 2017-09-29 DIAGNOSIS — G5790 Unspecified mononeuropathy of unspecified lower limb: Secondary | ICD-10-CM | POA: Insufficient documentation

## 2017-09-29 DIAGNOSIS — G894 Chronic pain syndrome: Secondary | ICD-10-CM | POA: Insufficient documentation

## 2017-09-29 MED ORDER — HYDROCODONE-ACETAMINOPHEN 10-325 MG PO TABS: 1.0000 | ORAL_TABLET | Freq: Every day | ORAL | 0 refills | Status: DC | PRN

## 2017-09-29 MED ORDER — DICLOFENAC SODIUM 50 MG PO TBEC: 50.0000 mg | DELAYED_RELEASE_TABLET | Freq: Two times a day (BID) | ORAL | 3 refills | Status: DC

## 2017-09-29 NOTE — Patient Instructions (Signed)
  TRY 1/4" HEEL INSERT LEFT SHOE  VIDEO YOURSELF WALKING TO WATCH YOUR GAIT MECHANICS. YOU WANT TO EVEN OUT YOUR PELVIS SO THAT IT DOESN'T DROP SO MUCH WHEN YOU PUT WEIGHT ON YOUR LEFT LEG.

## 2017-09-29 NOTE — Progress Notes (Signed)
Subjective:    Patient ID: Shaun Lang, male    DOB: 22-Dec-1955, 62 y.o.   MRN: [id number]  HPI  Shaun Lang is here in follow-up of his chronic pain syndrome.  I last saw him in December.  He complains of ongoing low back pain.  Is more involving his left side.  He notices it when he walks or if he stands for longer periods of time.  He can affect his sleep as well.  He is using hydrocodone for breakthrough pain as well as Flexeril for spasms.  He remains on meloxicam which he uses daily as needed.  Pain generally is in the low back with radiation into the left hip and buttock.  He states that he has had ankle surgery since I saw him last.  He has tried using an insert into his left shoe but has been inconsistent at best with this.  Pain Inventory Average Pain 7 Pain Right Now 7 My pain is aching  In the last 24 hours, has pain interfered with the following? General activity 7 Relation with others 9 Enjoyment of life 8 What TIME of day is your pain at its worst? daytime Sleep (in general) Poor  Pain is worse with: bending, inactivity and standing Pain improves with: medication Relief from Meds: 7  Mobility walk without assistance walk with assistance use a cane how many minutes can you walk? 15 ability to climb steps?  yes do you drive?  no Do you have any goals in this area?  yes  Function disabled: date disabled . I need assistance with the following:  household duties  Neuro/Psych numbness tingling spasms  Prior Studies Any changes since last visit?  no  Physicians involved in your care Any changes since last visit?  no   Family History  Problem Relation Age of Onset  . Diabetes Mother   . Cancer Sister        breast  . Diabetes Sister   . Stroke Sister 34   Social History   Socioeconomic History  . Marital status: Single    Spouse name: Not on file  . Number of children: Not on file  . Years of education: Not on file  . Highest education level:  Not on file  Occupational History  . Not on file  Social Needs  . Financial resource strain: Not on file  . Food insecurity:    Worry: Not on file    Inability: Not on file  . Transportation needs:    Medical: Not on file    Non-medical: Not on file  Tobacco Use  . Smoking status: Current Every Day Smoker    Packs/day: 1.00    Types: Cigarettes  . Smokeless tobacco: Never Used  Substance and Sexual Activity  . Alcohol use: Yes    Comment: rare  . Drug use: No  . Sexual activity: Not on file  Lifestyle  . Physical activity:    Days per week: Not on file    Minutes per session: Not on file  . Stress: Not on file  Relationships  . Social connections:    Talks on phone: Not on file    Gets together: Not on file    Attends religious service: Not on file    Active member of club or organization: Not on file    Attends meetings of clubs or organizations: Not on file    Relationship status: Not on file  Other Topics Concern  . Not  on file  Social History Narrative  . Not on file   Past Surgical History:  Procedure Laterality Date  . HERNIA REPAIR  04/1998   LI[facility]  . NECK SURGERY  07/21/02 and 01/2006  . WRIST SURGERY  1992   right   Past Medical History:  Diagnosis Date  . Bell's palsy April 2010  . Chest pain with low risk for cardiac etiology    Low Risk Myoview Sept 2013  . DJD (degenerative joint disease)    disabilty secondary to neck issues  . Hyperlipidemia   . Hypertension   . Smoker    BP (!) 145/89   Pulse 65   Resp 14   Ht 5\' 8"  (1.727 m)   Wt 189 lb (85.7 kg)   SpO2 98%   BMI 28.74 kg/m   Opioid Risk Score:   Fall Risk Score:  `1  Depression screen PHQ 2/9  [facility] 07/29/2017 02/16/2017 10/21/2016 08/21/2016 12/19/2015 07/03/2015 05/08/2015  Decreased Interest 0 0 0 0 1 2 2   Down, Depressed, Hopeless 0 0 0 0 0 2 2  PHQ - 2 Score 0 0 0 0 1 4 4   Altered sleeping - - - - - - -  Tired, decreased energy - - - - - - -  Change in  appetite - - - - - - -  Feeling bad or failure about yourself  - - - - - - -  Trouble concentrating - - - - - - -  Moving slowly or fidgety/restless - - - - - - -  Suicidal thoughts - - - - - - -  PHQ-9 Score - - - - - - -    Review of Systems  Constitutional: Positive for appetite change.  HENT: Negative.   Eyes: Negative.   Respiratory: Positive for apnea.   Cardiovascular: Negative.   Gastrointestinal: Negative.   Endocrine:       High blood sugar  Genitourinary: Negative.   Musculoskeletal: Positive for arthralgias, back pain, gait problem and neck pain.       Spasms  Allergic/Immunologic: Negative.   Neurological: Negative for speech difficulty.       Tingling        Objective:   Physical Exam General: No acute distress HEENT: EOMI, oral membranes moist Cards: reg rate  Chest: normal effort Abdomen: Soft, NT, ND Skin: dry, intact Extremities: no edema Skin:Clean and intact without signs of breakdown  Neuro:Normal cognitiion. Strength 5/5.  Normal sensory function Musculoskeletal:Patient with pain and spasm in the lumbar paraspinal muscles.  More tender on the left than right.  He has elevation of the right hemipelvis over the left side.  When he ambulates he tends to step down into the left leg.  I did not measure his leg length today but it there appears to be a discrepancy based now he appears in sitting and standing.  No obvious lumbar curvature noted.  Pain slightly worse with flexion as opposed to extension. Psych:pleasant and cooperative   Assessment & Plan:  1. Chronic left inguinal pain- appears to be related to his prior inguinal hernia (indirect) and mesh repair. This pain appears to be more related to scar tissue, muscle imbalance, posture, etc. Pain worsens with activity. I have concerns this could be hip joint as well  2. Left rotator cuff syndrome/bursitis and associated left AC joint arthritis  3. Mild CTS  4. Lumbar spondylosis:  5. Cervical  myofascial pain likely as a result  of poor posture and sleeping positions. 6. Left leg length discrepancy  Plan:  1. provided LB exercises and discussed gait mechanics.   -1/4" heel cushion left shoe.  2. Continue neurontin at 300mg  bid to tid. 3. Refilled hydrocodone today #45 for a two month rx. We will continue the controlled substance monitoring program, this consists of regular clinic visits, examinations, routine drug screening, pill counts as well as use of [place] Controlled Substance Reporting System. [place] today.   4. Change mobic to diclofenac 50mg  bid SCHEDULED with food 5. Insomnia- 6. Follow up with NP in63months. Fifteen minutes of face to face patient care time were spent during this visit. All questions were encouraged and answered.  Greater than 50% of time during this encounter was spent counseling patient/family in regard to cervical ROM and posture. [name]

## 2017-10-05 ENCOUNTER — Telehealth: Payer: Self-pay | Admitting: *Deleted

## 2017-10-05 LAB — TOXASSURE SELECT,+ANTIDEPR,UR

## 2017-10-05 NOTE — Telephone Encounter (Signed)
Urine drug screen for this encounter is consistent for prescribed medication 

## 2017-10-18 ENCOUNTER — Ambulatory Visit (AMBULATORY_SURGERY_CENTER): Payer: Self-pay | Admitting: *Deleted

## 2017-10-18 VITALS — Ht 68.0 in | Wt 188.0 lb

## 2017-10-18 DIAGNOSIS — Z86010 Personal history of colonic polyps: Secondary | ICD-10-CM

## 2017-10-18 MED ORDER — NA SULFATE-K SULFATE-MG SULF 17.5-3.13-1.6 GM/177ML PO SOLN: 1.0000 | Freq: Once | ORAL | 0 refills | Status: AC

## 2017-10-18 NOTE — Progress Notes (Signed)
Patient denies any allergies to eggs or soy. Patient denies any problems with anesthesia/sedation. Patient denies any oxygen use at home. Patient denies taking any diet/weight loss medications or blood thinners. EMMI education offered, pt declined. Went over 2 day prep instructions multiple times.
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
## 2017-10-21 ENCOUNTER — Encounter: Payer: Self-pay | Admitting: Nurse Practitioner

## 2017-10-21 ENCOUNTER — Ambulatory Visit (INDEPENDENT_AMBULATORY_CARE_PROVIDER_SITE_OTHER): Payer: Medicare Other | Admitting: Nurse Practitioner

## 2017-10-21 VITALS — BP 125/73 | HR 73 | Ht 68.0 in | Wt 189.8 lb

## 2017-10-21 DIAGNOSIS — Z9989 Dependence on other enabling machines and devices: Secondary | ICD-10-CM

## 2017-10-21 DIAGNOSIS — G4733 Obstructive sleep apnea (adult) (pediatric): Secondary | ICD-10-CM | POA: Diagnosis not present

## 2017-10-21 NOTE — Progress Notes (Signed)
Faxed CPAP order re: patient is noncompliant with CPAP machine. Pick up machine, sent to [facility].

## 2017-10-21 NOTE — Patient Instructions (Signed)
CPAP compliance 0% greater than 4 hours Due to non compliance need to turn the machine in to equipment company Check with your dentist to see if he does a dental appliance for sleep apnea. No follow up here

## 2017-11-01 ENCOUNTER — Ambulatory Visit (AMBULATORY_SURGERY_CENTER): Payer: Medicare Other | Admitting: Gastroenterology

## 2017-11-01 ENCOUNTER — Telehealth: Payer: Self-pay | Admitting: *Deleted

## 2017-11-01 ENCOUNTER — Encounter: Payer: Medicare Other | Admitting: Gastroenterology

## 2017-11-01 ENCOUNTER — Encounter: Payer: Self-pay | Admitting: Gastroenterology

## 2017-11-01 VITALS — BP 110/76 | HR 69 | Temp 99.3°F | Resp 12 | Ht 68.0 in | Wt 189.8 lb

## 2017-11-01 DIAGNOSIS — Z86010 Personal history of colonic polyps: Secondary | ICD-10-CM

## 2017-11-01 DIAGNOSIS — Z5181 Encounter for therapeutic drug level monitoring: Secondary | ICD-10-CM

## 2017-11-01 DIAGNOSIS — R1032 Left lower quadrant pain: Secondary | ICD-10-CM

## 2017-11-01 MED ORDER — SODIUM CHLORIDE 0.9 % IV SOLN: 500.0000 mL | Freq: Once | INTRAVENOUS | Status: DC

## 2017-11-01 NOTE — Op Note (Signed)
[facility] Patient Name: Shaun Lang Procedure Date: 11/01/2017 10:37 AM MRN: [id number] Endoscopist: Lynann Bologna , MD Age: 62 Referring MD:  Date of Birth: 1955-09-17 Gender: Male Account #: [id number] Procedure:                Colonoscopy Indications:              High risk colon cancer surveillance: Personal                            history of colonic polyps Medicines:                Monitored Anesthesia Care Procedure:                Pre-Anesthesia Assessment:                           - Prior to the procedure, a History and Physical                            was performed, and patient medications and                            allergies were reviewed. The patient's tolerance of                            previous anesthesia was also reviewed. The risks                            and benefits of the procedure and the sedation                            options and risks were discussed with the patient.                            All questions were answered, and informed consent                            was obtained. Prior Anticoagulants: The patient has                            taken no previous anticoagulant or antiplatelet                            agents. ASA Grade Assessment: II - A patient with                            mild systemic disease. After reviewing the risks                            and benefits, the patient was deemed in                            satisfactory condition to undergo the procedure.  After obtaining informed consent, the colonoscope                            was passed under direct vision. Throughout the                            procedure, the patient's blood pressure, pulse, and                            oxygen saturations were monitored continuously. The                            Colonoscope was introduced through the anus and                            advanced to the 2 cm into the ileum. The                           colonoscopy was performed without difficulty. The                            patient tolerated the procedure well. The quality                            of the bowel preparation was excellent. Scope In: 10:40:29 AM Scope Out: 10:53:49 AM Scope Withdrawal Time: 0 hours 8 minutes 36 seconds  Total Procedure Duration: 0 hours 13 minutes 20 seconds  Findings:                 The entire examined colon appeared normal on direct                            and retroflexion views. Minimal internal                            hemorrhoids. Complications:            No immediate complications. Estimated Blood Loss:     Estimated blood loss: none. Impression:               - The entire examined colon is normal on direct and                            retroflexion views.                           - No specimens collected. Recommendation:           - Patient has a contact number available for                            emergencies. The signs and symptoms of potential                            delayed complications were discussed with the  patient. Return to normal activities tomorrow.                            Written discharge instructions were provided to the                            patient.                           - Resume previous diet.                           - Continue present medications.                           - Repeat colonoscopy in 10 years for screening                            purposes. , Earlier if he starts having any new                            problems or if there is any change in family                            history.                           - Return to GI clinic PRN. Lynann Bolognaajesh Ilana Prezioso, MD 11/01/2017 10:57:36 AM This report has been signed electronically.

## 2017-11-01 NOTE — Progress Notes (Signed)
Report to PACU, RN, vss, BBS= Clear.  

## 2017-11-01 NOTE — Progress Notes (Signed)
Pt's states no medical or surgical changes since previsit or office visit. 

## 2017-11-01 NOTE — Telephone Encounter (Signed)
Mr Shaun Lang called about his prescription for hydrocodone not being at the pharmacy. There was one for July but not August.  PMP shows the one for 09/29/17 was filled on 09/29/17.

## 2017-11-01 NOTE — Patient Instructions (Signed)
YOU HAD AN ENDOSCOPIC PROCEDURE TODAY AT THE [facility]:   Refer to the procedure report that was given to you for any specific questions about what was found during the examination.  If the procedure report does not answer your questions, please call your gastroenterologist to clarify.  If you requested that your care partner not be given the details of your procedure findings, then the procedure report has been included in a sealed envelope for you to review at your convenience later.  YOU SHOULD EXPECT: Some feelings of bloating in the abdomen. Passage of more gas than usual.  Walking can help get rid of the air that was put into your GI tract during the procedure and reduce the bloating. If you had a lower endoscopy (such as a colonoscopy or flexible sigmoidoscopy) you may notice spotting of blood in your stool or on the toilet paper. If you underwent a bowel prep for your procedure, you may not have a normal bowel movement for a few days.  Please Note:  You might notice some irritation and congestion in your nose or some drainage.  This is from the oxygen used during your procedure.  There is no need for concern and it should clear up in a day or so.  SYMPTOMS TO REPORT IMMEDIATELY:   Following lower endoscopy (colonoscopy or flexible sigmoidoscopy):  Excessive amounts of blood in the stool  Significant tenderness or worsening of abdominal pains  Swelling of the abdomen that is new, acute  Fever of 100F or higher   For urgent or emergent issues, a gastroenterologist can be reached at any hour by calling [phone].   DIET:  We do recommend a small meal at first, but then you may proceed to your regular diet.  Drink plenty of fluids but you should avoid alcoholic beverages for 24 hours.  ACTIVITY:  You should plan to take it easy for the rest of today and you should NOT DRIVE or use heavy machinery until tomorrow (because of the sedation medicines used during the test).     FOLLOW UP: Our staff will call the number listed on your records the next business day following your procedure to check on you and address any questions or concerns that you may have regarding the information given to you following your procedure. If we do not reach you, we will leave a message.  However, if you are feeling well and you are not experiencing any problems, there is no need to return our call.  We will assume that you have returned to your regular daily activities without incident.  If any biopsies were taken you will be contacted by phone or by letter within the next 1-3 weeks.  Please call us at [phone] if you have not heard about the biopsies in 3 weeks.    SIGNATURES/CONFIDENTIALITY: You and/or your care partner have signed paperwork which will be entered into your electronic medical record.  These signatures attest to the fact that that the information above on your After Visit Summary has been reviewed and is understood.  Full responsibility of the confidentiality of this discharge information lies with you and/or your care-partner.  Read all of the handouts given to you by your recovery room nurse. 

## 2017-11-02 ENCOUNTER — Telehealth: Payer: Self-pay

## 2017-11-02 MED ORDER — HYDROCODONE-ACETAMINOPHEN 10-325 MG PO TABS: 1.0000 | ORAL_TABLET | Freq: Every day | ORAL | 0 refills | Status: DC | PRN

## 2017-11-02 NOTE — Telephone Encounter (Signed)
Mr Shaun Lang notified.

## 2017-11-02 NOTE — Telephone Encounter (Signed)
rx sent

## 2017-11-02 NOTE — Telephone Encounter (Signed)
  Follow up Call-  Call Kimmie Doren number 11/01/2017  Post procedure Call Kyrah Schiro phone  # [phone]  Permission to leave phone message Yes  Some recent data might be hidden     Patient questions:  Do you have a fever, pain , or abdominal swelling? No. Pain Score  0 *  Have you tolerated food without any problems? Yes.    Have you been able to return to your normal activities? Yes.    Do you have any questions about your discharge instructions: Diet   No. Medications  No. Follow up visit  No.  Do you have questions or concerns about your Care? No.  Actions: * If pain score is 4 or above: No action needed, pain <4.

## 2017-11-18 ENCOUNTER — Encounter: Payer: Self-pay | Admitting: Podiatry

## 2017-11-18 ENCOUNTER — Ambulatory Visit (INDEPENDENT_AMBULATORY_CARE_PROVIDER_SITE_OTHER): Payer: Medicare Other | Admitting: Podiatry

## 2017-11-18 DIAGNOSIS — M2580 Other specified joint disorders, unspecified joint: Secondary | ICD-10-CM

## 2017-11-18 DIAGNOSIS — M205X2 Other deformities of toe(s) (acquired), left foot: Secondary | ICD-10-CM

## 2017-11-18 DIAGNOSIS — Z9889 Other specified postprocedural states: Secondary | ICD-10-CM

## 2017-11-18 DIAGNOSIS — M899 Disorder of bone, unspecified: Secondary | ICD-10-CM | POA: Diagnosis not present

## 2017-11-18 MED ORDER — MELOXICAM 15 MG PO TABS: 15.0000 mg | ORAL_TABLET | Freq: Every day | ORAL | 0 refills | Status: DC

## 2017-11-18 NOTE — Progress Notes (Signed)
Subjective:  Patient ID: Shaun Lang, male    DOB: 09/02/1955,  MRN: [id number]  Chief Complaint  Patient presents with  . Routine Post Op    left foot - 2 month follow up   62 y.o. male presents with the above complaint. Having pain underneath the big toe but otherwise doing well.  Review of Systems: Negative except as noted in the HPI. Denies N/V/F/Ch.  Past Medical History:  Diagnosis Date  . Bell's palsy April 2010  . Chest pain with low risk for cardiac etiology    Low Risk Myoview Sept 2013  . DJD (degenerative joint disease)    disabilty secondary to neck issues  . Hyperlipidemia   . Hypertension   . Sleep apnea    does not use CPAP  . Smoker     Current Outpatient Medications:  .  atorvastatin (LIPITOR) 40 MG tablet, Take 1 tablet (40 mg total) by mouth daily at 6 PM., Disp: 90 tablet, Rfl: 1 .  cyclobenzaprine (FLEXERIL) 10 MG tablet, Take 1 tablet (10 mg total) by mouth 2 (two) times daily as needed for muscle spasms., Disp: 60 tablet, Rfl: 3 .  diclofenac (VOLTAREN) 50 MG EC tablet, Take 1 tablet (50 mg total) by mouth 2 (two) times daily with a meal., Disp: 60 tablet, Rfl: 3 .  gabapentin (NEURONTIN) 300 MG capsule, Take 300 mg by mouth at bedtime as needed (pain). Bid to tid, Disp: , Rfl:  .  HYDROcodone-acetaminophen (NORCO) 10-325 MG tablet, Take 1 tablet by mouth daily as needed (pain). May Take an extra tablet when pain is moderate to sever, 15 days out of the month, Disp: 45 tablet, Rfl: 0 .  hydrocortisone cream 1 %, Apply 1 application topically 3 (three) times daily as needed for itching., Disp: , Rfl:  .  lisinopril (PRINIVIL,ZESTRIL) 5 MG tablet, Take 1 tablet (5 mg total) by mouth daily., Disp: 90 tablet, Rfl: 1 .  meloxicam (MOBIC) 15 MG tablet, Take 1 tablet (15 mg total) by mouth daily., Disp: 30 tablet, Rfl: 0 .  Multiple Vitamin (MULTIVITAMIN WITH MINERALS) TABS tablet, Take 1 tablet by mouth daily., Disp: , Rfl:  .  nicotine (NICODERM CQ - DOSED  IN MG/24 HOURS) 21 mg/24hr patch, Place 21 mg onto the skin daily., Disp: , Rfl:  .  triamterene-hydrochlorothiazide (MAXZIDE-25) 37.5-25 MG tablet, Take 1 tablet by mouth daily., Disp: 90 tablet, Rfl: 1 .  zolpidem (AMBIEN) 5 MG tablet, Take 1-2 tablets (5-10 mg) by mouth at bedtime as needed for sleep., Disp: 30 tablet, Rfl: 1  Current Facility-Administered Medications:  .  0.9 %  sodium chloride infusion, 500 mL, Intravenous, Once, Lynann Bologna, MD  Social History   Tobacco Use  Smoking Status Current Every Day Smoker  . Packs/day: 1.00  . Types: Cigarettes  Smokeless Tobacco Never Used    No Known Allergies Objective:  There were no vitals filed for this visit. There is no height or weight on file to calculate BMI. Constitutional Well developed. Well nourished.  Vascular Dorsalis pedis pulses palpable bilaterally. Posterior tibial pulses palpable bilaterally. Capillary refill normal to all digits.  No cyanosis or clubbing noted. Pedal hair growth normal.  Neurologic Normal speech. Oriented to person, place, and time. Epicritic sensation to light touch grossly present bilaterally.  Dermatologic Nails well groomed and normal in appearance. No open wounds. No skin lesions.  Orthopedic: Normal joint ROM without pain or crepitus bilaterally except for L 1st MPJ which has decreased ROM. No  bony end feel. No visible deformities. POP L tibial sesamoid.   Radiographs: None today. Assessment:   1. Sesamoiditis   2. Sesamoid pain   3. Hallux limitus of left foot   4. History of bunionectomy    Plan:  Patient was evaluated and treated and all questions answered.  Sesamoiditis L Tibial Sesamoid -Injection delivered to the L tibial sesamoid as below.  -Discussed better shoe gear. -Rx Meloxicam. Educated on taking of medication.  Procedure: Joint Injection Location: Left Tibial sesamoid Skin Prep: Alcohol. Injectate: 0.5 cc 1% lidocaine plain, 0.5 cc dexamethasone  phosphate. Disposition: Patient tolerated procedure moderately well - withdrew foot during injection. Injection site dressed with a band-aid.  Return if symptoms worsen or fail to improve.

## 2017-11-24 NOTE — Progress Notes (Signed)
I agree with the assessment and plan as directed by NP .The patient is known to me .   Lonza Shimabukuro, MD  

## 2017-11-30 ENCOUNTER — Encounter: Payer: Self-pay | Admitting: Physical Medicine & Rehabilitation

## 2017-11-30 ENCOUNTER — Encounter: Payer: Medicare Other | Attending: Physical Medicine & Rehabilitation | Admitting: Physical Medicine & Rehabilitation

## 2017-11-30 ENCOUNTER — Other Ambulatory Visit: Payer: Self-pay

## 2017-11-30 VITALS — BP 154/90 | HR 65 | Ht 68.0 in | Wt 190.0 lb

## 2017-11-30 DIAGNOSIS — M150 Primary generalized (osteo)arthritis: Secondary | ICD-10-CM | POA: Diagnosis present

## 2017-11-30 DIAGNOSIS — R1032 Left lower quadrant pain: Secondary | ICD-10-CM

## 2017-11-30 DIAGNOSIS — Z5181 Encounter for therapeutic drug level monitoring: Secondary | ICD-10-CM | POA: Diagnosis not present

## 2017-11-30 DIAGNOSIS — M47817 Spondylosis without myelopathy or radiculopathy, lumbosacral region: Secondary | ICD-10-CM | POA: Diagnosis not present

## 2017-11-30 DIAGNOSIS — G894 Chronic pain syndrome: Secondary | ICD-10-CM | POA: Insufficient documentation

## 2017-11-30 DIAGNOSIS — M7581 Other shoulder lesions, right shoulder: Secondary | ICD-10-CM | POA: Insufficient documentation

## 2017-11-30 DIAGNOSIS — R1030 Lower abdominal pain, unspecified: Secondary | ICD-10-CM | POA: Diagnosis present

## 2017-11-30 DIAGNOSIS — G5790 Unspecified mononeuropathy of unspecified lower limb: Secondary | ICD-10-CM | POA: Insufficient documentation

## 2017-11-30 MED ORDER — HYDROCODONE-ACETAMINOPHEN 10-325 MG PO TABS: 1.0000 | ORAL_TABLET | Freq: Every day | ORAL | 0 refills | Status: DC | PRN

## 2017-11-30 MED ORDER — DICLOFENAC SODIUM 75 MG PO TBEC: 75.0000 mg | DELAYED_RELEASE_TABLET | Freq: Two times a day (BID) | ORAL | 3 refills | Status: DC

## 2017-11-30 MED ORDER — METHOCARBAMOL 500 MG PO TABS: 500.0000 mg | ORAL_TABLET | Freq: Four times a day (QID) | ORAL | 1 refills | Status: DC

## 2017-11-30 NOTE — Patient Instructions (Signed)
PLEASE FEEL FREE TO CALL OUR OFFICE WITH ANY PROBLEMS OR QUESTIONS ([phone])      

## 2017-11-30 NOTE — Progress Notes (Signed)
Subjective:    Patient ID: Shaun Lang, male    DOB: Aug 11, 1955, 62 y.o.   MRN: [id number]  HPI   Mr. Salsman is here in follow-up of his chronic pain syndrome.  I last saw him in July. He states that about 3 weeks ago he was working in the yard and picking up a bag of grass. He felt a sharp pain hit his back which sent him to the ground, and his back locked up. His back "locked up" again a week ago when he first got out of bed one morning. He finds that his back is stiffest in the morning or if he's sitting for a prolonged period of time and tries to stand up. The pain hits him also when he's standing or walking. He states that his pain medication helps him move a bit.  He also has flexeril and gabapentin, voltaren which help somewhat.   Pain Inventory Average Pain 8 Pain Right Now 7 My pain is burning, dull and aching  In the last 24 hours, has pain interfered with the following? General activity 7 Relation with others 8 Enjoyment of life 7 What TIME of day is your pain at its worst? morning and evening Sleep (in general) Poor  Pain is worse with: bending, sitting, standing and some activites Pain improves with: medication Relief from Meds: 6  Mobility how many minutes can you walk? 4 ability to climb steps?  yes do you drive?  no  Function retired  Neuro/Psych tingling spasms  Prior Studies Any changes since last visit?  no  Physicians involved in your care Any changes since last visit?  no   Family History  Problem Relation Age of Onset  . Diabetes Mother   . Cancer Sister        breast  . Diabetes Sister   . Stroke Sister 82  . Breast cancer Sister   . Colon cancer Neg Hx   . Esophageal cancer Neg Hx   . Rectal cancer Neg Hx   . Stomach cancer Neg Hx    Social History   Socioeconomic History  . Marital status: Single    Spouse name: Not on file  . Number of children: Not on file  . Years of education: Not on file  . Highest education level: Not  on file  Occupational History  . Not on file  Social Needs  . Financial resource strain: Not on file  . Food insecurity:    Worry: Not on file    Inability: Not on file  . Transportation needs:    Medical: Not on file    Non-medical: Not on file  Tobacco Use  . Smoking status: Current Every Day Smoker    Packs/day: 1.00    Types: Cigarettes  . Smokeless tobacco: Never Used  Substance and Sexual Activity  . Alcohol use: Yes    Alcohol/week: 3.0 standard drinks    Types: 3 Shots of liquor per week  . Drug use: No  . Sexual activity: Not on file  Lifestyle  . Physical activity:    Days per week: Not on file    Minutes per session: Not on file  . Stress: Not on file  Relationships  . Social connections:    Talks on phone: Not on file    Gets together: Not on file    Attends religious service: Not on file    Active member of club or organization: Not on file    Attends  meetings of clubs or organizations: Not on file    Relationship status: Not on file  Other Topics Concern  . Not on file  Social History Narrative  . Not on file   Past Surgical History:  Procedure Laterality Date  . BUNIONECTOMY Left 07/15/2017   with big toe fracture repair  . COLONOSCOPY  07/18/2012   polyps-Dr.Outlaw  . HERNIA REPAIR  04/1998   [facility]  . NECK SURGERY  07/21/02 and 01/2006  . WRIST SURGERY  1992   right   Past Medical History:  Diagnosis Date  . Bell's palsy April 2010  . Chest pain with low risk for cardiac etiology    Low Risk Myoview Sept 2013  . DJD (degenerative joint disease)    disabilty secondary to neck issues  . Hyperlipidemia   . Hypertension   . Sleep apnea    does not use CPAP  . Smoker    BP (!) 154/90   Pulse 65   Ht 5\' 8"  (1.727 m)   Wt 190 lb (86.2 kg)   SpO2 98%   BMI 28.89 kg/m   Opioid Risk Score:   Fall Risk Score:  `1  Depression screen PHQ 2/9  Depression screen [facility] 2/9 11/30/2017 07/29/2017 02/16/2017 10/21/2016 08/21/2016 12/19/2015 07/03/2015    Decreased Interest 0 0 0 0 0 1 2  Down, Depressed, Hopeless 0 0 0 0 0 0 2  PHQ - 2 Score 0 0 0 0 0 1 4  Altered sleeping - - - - - - -  Tired, decreased energy - - - - - - -  Change in appetite - - - - - - -  Feeling bad or failure about yourself  - - - - - - -  Trouble concentrating - - - - - - -  Moving slowly or fidgety/restless - - - - - - -  Suicidal thoughts - - - - - - -  PHQ-9 Score - - - - - - -    Review of Systems  Constitutional: Negative.   HENT: Negative.   Eyes: Negative.   Respiratory: Positive for apnea.   Cardiovascular: Negative.   Gastrointestinal: Positive for constipation.  Endocrine: Negative.   Genitourinary: Negative.   Musculoskeletal: Negative.   Skin: Negative.   Allergic/Immunologic: Negative.   Neurological: Negative.   Hematological: Negative.   Psychiatric/Behavioral: Negative.        Objective:   Physical Exam  General: No acute distress HEENT: EOMI, oral membranes moist Cards: reg rate  Chest: normal effort Abdomen: Soft, NT, ND Skin: dry, intact Extremities: no edema Skin:Clean and intact without signs of breakdown  Neuro:Normal cognitiion. Strength 5/5.Normal sensory function Musculoskeletal:Patient with generalized tenderness in the left lumbar spine primarily today.  He struggled moving from a sitting to standing position.  Facet maneuvers were positive on the left as well as side bending as far as provoking pain is concerned.  Had some discomfort with flexion as well.  Hamstrings appear tight.  Right hemipelvis remains elevated slightly over the left side.  Straight leg raising was equivocal except for hamstring tightness.[name] Psych:pleasant and cooperative   Assessment & Plan:  1. Chronic left inguinal pain- appears to be related to his prior inguinal hernia (indirect) and mesh repair. This pain appears to be more related to scar tissue, muscle imbalance, posture, etc. Pain worsens with activity. I have concerns this  could be hip joint as well  2. Left rotator cuff syndrome/bursitis and associated left AC joint  arthritis  3. Mild CTS  4. Lumbar spondylosis:  Likely acute muscle strain in the setting of the spondylosis/facet arthropathy 5. Cervical myofascial painlikely as a result of poor posture and sleeping positions. 6. Left leg length discrepancy  Plan:  1. Referred patient for x-rays of his lumbar spine 2. Continue neurontin at 300mg  bid to tid. 3. Refilled hydrocodone today #53for a two month rx. We will continue the controlled substance monitoring program, this consists of regular clinic visits, examinations, routine drug screening, pill counts as well as use of [place] Controlled Substance Reporting System. [place].   4. Increase diclofenac to 75mg  bid SCHEDULED with food 5.  Change Flexeril to Robaxin 500 mg 4 times daily.  Provided patient lumbar facet stretching exercises which he needs to perform at home.  Consider trial of physical therapy as well.      6. Follow up with me or nurse practitioner in 1 month's time.. 15 minutes of direct patient time was spent in the room today.  This included review of imaging as well as models to demonstrate likely problem.

## 2017-12-08 ENCOUNTER — Ambulatory Visit
Admission: RE | Admit: 2017-12-08 | Discharge: 2017-12-08 | Disposition: A | Discharge status: Home / Self Care | Payer: Medicare Other | Source: Ambulatory Visit | Attending: Physical Medicine & Rehabilitation | Admitting: Physical Medicine & Rehabilitation

## 2017-12-08 DIAGNOSIS — M47817 Spondylosis without myelopathy or radiculopathy, lumbosacral region: Secondary | ICD-10-CM

## 2017-12-10 ENCOUNTER — Telehealth: Payer: Self-pay | Admitting: Physical Medicine & Rehabilitation

## 2017-12-10 NOTE — Telephone Encounter (Signed)
Please let [name] know that I reviewed his lumbar xrays. Mild degenerative disease but no major abnormalities.  thx

## 2017-12-13 NOTE — Telephone Encounter (Signed)
Notified. 

## 2017-12-29 ENCOUNTER — Encounter: Payer: Self-pay | Admitting: Physical Medicine & Rehabilitation

## 2017-12-29 ENCOUNTER — Encounter: Payer: Medicare Other | Attending: Physical Medicine & Rehabilitation | Admitting: Physical Medicine & Rehabilitation

## 2017-12-29 VITALS — BP 152/93 | HR 62 | Resp 14 | Ht 68.0 in | Wt 189.0 lb

## 2017-12-29 DIAGNOSIS — G5790 Unspecified mononeuropathy of unspecified lower limb: Secondary | ICD-10-CM | POA: Insufficient documentation

## 2017-12-29 DIAGNOSIS — R1032 Left lower quadrant pain: Secondary | ICD-10-CM

## 2017-12-29 DIAGNOSIS — M7918 Myalgia, other site: Secondary | ICD-10-CM

## 2017-12-29 DIAGNOSIS — Z5181 Encounter for therapeutic drug level monitoring: Secondary | ICD-10-CM

## 2017-12-29 DIAGNOSIS — M47817 Spondylosis without myelopathy or radiculopathy, lumbosacral region: Secondary | ICD-10-CM | POA: Diagnosis not present

## 2017-12-29 DIAGNOSIS — M150 Primary generalized (osteo)arthritis: Secondary | ICD-10-CM | POA: Diagnosis present

## 2017-12-29 DIAGNOSIS — G894 Chronic pain syndrome: Secondary | ICD-10-CM | POA: Diagnosis present

## 2017-12-29 DIAGNOSIS — R1030 Lower abdominal pain, unspecified: Secondary | ICD-10-CM | POA: Diagnosis present

## 2017-12-29 DIAGNOSIS — M7581 Other shoulder lesions, right shoulder: Secondary | ICD-10-CM | POA: Diagnosis present

## 2017-12-29 MED ORDER — HYDROCODONE-ACETAMINOPHEN 10-325 MG PO TABS: 1.0000 | ORAL_TABLET | Freq: Every day | ORAL | 0 refills | Status: DC | PRN

## 2017-12-29 NOTE — Patient Instructions (Signed)
Low Back Strain Rehab  Ask your health care provider which exercises are safe for you. Do exercises exactly as told by your health care provider and adjust them as directed. It is normal to feel mild stretching, pulling, tightness, or discomfort as you do these exercises, but you should stop right away if you feel sudden pain or your pain gets worse. Do not begin these exercises until told by your health care provider.  Stretching and range of motion exercises  These exercises warm up your muscles and joints and improve the movement and flexibility of your back. These exercises also help to relieve pain, numbness, and tingling.  Exercise A: Single knee to chest    1. Lie on your back on a firm surface with both legs straight.  2. Bend one of your knees. Use your hands to move your knee up toward your chest until you feel a gentle stretch in your lower back and buttock.  ? Hold your leg in this position by holding onto the front of your knee.  ? Keep your other leg as straight as possible.  3. Hold for __________ seconds.  4. Slowly return to the starting position.  5. Repeat with your other leg.  Repeat __________ times. Complete this exercise __________ times a day.  Exercise B: Prone extension on elbows    1. Lie on your abdomen on a firm surface.  2. Prop yourself up on your elbows.  3. Use your arms to help lift your chest up until you feel a gentle stretch in your abdomen and your lower back.  ? This will place some of your body weight on your elbows. If this is uncomfortable, try stacking pillows under your chest.  ? Your hips should stay down, against the surface that you are lying on. Keep your hip and back muscles relaxed.  4. Hold for __________ seconds.  5. Slowly relax your upper body and return to the starting position.  Repeat __________ times. Complete this exercise __________ times a day.  Strengthening exercises  These exercises build strength and endurance in your back. Endurance is the ability to  use your muscles for a long time, even after they get tired.  Exercise C: Pelvic tilt  1. Lie on your back on a firm surface. Bend your knees and keep your feet flat.  2. Tense your abdominal muscles. Tip your pelvis up toward the ceiling and flatten your lower back into the floor.  ? To help with this exercise, you may place a small towel under your lower back and try to push your back into the towel.  3. Hold for __________ seconds.  4. Let your muscles relax completely before you repeat this exercise.  Repeat __________ times. Complete this exercise __________ times a day.  Exercise D: Alternating arm and leg raises    1. Get on your hands and knees on a firm surface. If you are on a hard floor, you may want to use padding to cushion your knees, such as an exercise mat.  2. Line up your arms and legs. Your hands should be below your shoulders, and your knees should be below your hips.  3. Lift your left leg behind you. At the same time, raise your right arm and straighten it in front of you.  ? Do not lift your leg higher than your hip.  ? Do not lift your arm higher than your shoulder.  ? Keep your abdominal and back muscles tight.  ?   Keep your hips facing the ground.  ? Do not arch your back.  ? Keep your balance carefully, and do not hold your breath.  4. Hold for __________ seconds.  5. Slowly return to the starting position and repeat with your right leg and your left arm.  Repeat __________ times. Complete this exercise __________times a day.  Exercise J: Single leg lower with bent knees  1. Lie on your back on a firm surface.  2. Tense your abdominal muscles and lift your feet off the floor, one foot at a time, so your knees and hips are bent in an "L" shape (at about 90 degrees).  ? Your knees should be over your hips and your lower legs should be parallel to the floor.  3. Keeping your abdominal muscles tense and your knee bent, slowly lower one of your legs so your toe touches the ground.  4. Lift your  leg back up to return to the starting position.  ? Do not hold your breath.  ? Do not let your back arch. Keep your back flat against the ground.  5. Repeat with your other leg.  Repeat __________ times. Complete this exercise __________ times a day.  Posture and body mechanics    Body mechanics refers to the movements and positions of your body while you do your daily activities. Posture is part of body mechanics. Good posture and healthy body mechanics can help to relieve stress in your body's tissues and joints. Good posture means that your spine is in its natural S-curve position (your spine is neutral), your shoulders are pulled back slightly, and your head is not tipped forward. The following are general guidelines for applying improved posture and body mechanics to your everyday activities.  Standing     When standing, keep your spine neutral and your feet about hip-width apart. Keep a slight bend in your knees. Your ears, shoulders, and hips should line up.   When you do a task in which you stand in one place for a long time, place one foot up on a stable object that is 2-4 inches (5-10 cm) high, such as a footstool. This helps keep your spine neutral.  Sitting     When sitting, keep your spine neutral and keep your feet flat on the floor. Use a footrest, if necessary, and keep your thighs parallel to the floor. Avoid rounding your shoulders, and avoid tilting your head forward.   When working at a desk or a computer, keep your desk at a height where your hands are slightly lower than your elbows. Slide your chair under your desk so you are close enough to maintain good posture.   When working at a computer, place your monitor at a height where you are looking straight ahead and you do not have to tilt your head forward or downward to look at the screen.  Resting     When lying down and resting, avoid positions that are most painful for you.   If you have pain with activities such as sitting, bending,  stooping, or squatting (flexion-based activities), lie in a position in which your body does not bend very much. For example, avoid curling up on your side with your arms and knees near your chest (fetal position).   If you have pain with activities such as standing for a long time or reaching with your arms (extension-based activities), lie with your spine in a neutral position and bend your knees slightly. Try the   following positions:  ? Lying on your side with a pillow between your knees.  ? Lying on your back with a pillow under your knees.  Lifting     When lifting objects, keep your feet at least shoulder-width apart and tighten your abdominal muscles.   Bend your knees and hips and keep your spine neutral. It is important to lift using the strength of your legs, not your back. Do not lock your knees straight out.   Always ask for help to lift heavy or awkward objects.  This information is not intended to replace advice given to you by your health care provider. Make sure you discuss any questions you have with your health care provider.  Document Released: 03/09/2005 Document Revised: 11/14/2015 Document Reviewed: 12/19/2014  [employer] Interactive Patient Education  2018 [employer]

## 2017-12-29 NOTE — Progress Notes (Signed)
Subjective:    Patient ID: Shaun Lang, male    DOB: 02/29/1956, 62 y.o.   MRN: [id number]  HPI   Shaun Lang is back regarding his chronic pain.  He states that he is doing a bit better than last time I saw him.  I asked him if he is working on the stretches that I gave him and he said that he the stretches were pin to his refrigerator and he really has not done much of them so far.  The diclofenac seem to help a bit with some of his pain.  He admits to being busy with his Shaun who recently moved into college and that has been a bit of a strain on his back as well.  We did send him for lumbar x-rays last month which I reviewed with him today.  Results are as follows:    Normal lumbar segmentation. Improved lordosis compared to 2018. Disc spaces are stable and relatively preserved. There is degenerative anterior endplate spurring, most pronounced at L1-L2 and L3-L4. No pars fracture. Mild bilateral L5-S1 facet hypertrophy. Visible lower thoracic levels appear intact. Sacral ala and SI joints appear normal. Negative abdominal visceral contours. Incidental pelvic Phleboliths.  He is also using hydrocodone for pain for baseline which helps with activity tolerance.  He utilizes Robaxin for muscle spasms.  I asked him if he is using his left shoe insert consistently and he is not.  Pain Inventory Average Pain 7 Pain Right Now 8 My pain is sharp, dull and aching  In the last 24 hours, has pain interfered with the following? General activity 7 Relation with others 8 Enjoyment of life 6 What TIME of day is your pain at its worst? morning, evening  Sleep (in general) Poor  Pain is worse with: bending, sitting, standing and some activites Pain improves with: pacing activities and medication Relief from Meds: 6  Mobility walk without assistance how many minutes can you walk? 10 ability to climb steps?  yes do you drive?  no Do you have any goals in this area?  yes  Function disabled:  date disabled . Do you have any goals in this area?  yes  Neuro/Psych tingling spasms  Prior Studies Any changes since last visit?  no  Physicians involved in your care Any changes since last visit?  no   Family History  Problem Relation Age of Onset  . Diabetes Mother   . Cancer Sister        breast  . Diabetes Sister   . Stroke Sister 81  . Breast cancer Sister   . Colon cancer Neg Hx   . Esophageal cancer Neg Hx   . Rectal cancer Neg Hx   . Stomach cancer Neg Hx    Social History   Socioeconomic History  . Marital status: Single    Spouse name: Not on file  . Number of children: Not on file  . Years of education: Not on file  . Highest education level: Not on file  Occupational History  . Not on file  Social Needs  . Financial resource strain: Not on file  . Food insecurity:    Worry: Not on file    Inability: Not on file  . Transportation needs:    Medical: Not on file    Non-medical: Not on file  Tobacco Use  . Smoking status: Current Every Day Smoker    Packs/day: 1.00    Types: Cigarettes  . Smokeless tobacco: Never Used  Substance and Sexual Activity  . Alcohol use: Yes    Alcohol/week: 3.0 standard drinks    Types: 3 Shots of liquor per week  . Drug use: No  . Sexual activity: Not on file  Lifestyle  . Physical activity:    Days per week: Not on file    Minutes per session: Not on file  . Stress: Not on file  Relationships  . Social connections:    Talks on phone: Not on file    Gets together: Not on file    Attends religious service: Not on file    Active member of club or organization: Not on file    Attends meetings of clubs or organizations: Not on file    Relationship status: Not on file  Other Topics Concern  . Not on file  Social History Narrative  . Not on file   Past Surgical History:  Procedure Laterality Date  . BUNIONECTOMY Left 07/15/2017   with big toe fracture repair  . COLONOSCOPY  07/18/2012   polyps-Dr.Outlaw    . HERNIA REPAIR  04/1998   [facility]  . NECK SURGERY  07/21/02 and 01/2006  . WRIST SURGERY  1992   right   Past Medical History:  Diagnosis Date  . Bell's palsy April 2010  . Chest pain with low risk for cardiac etiology    Low Risk Myoview Sept 2013  . DJD (degenerative joint disease)    disabilty secondary to neck issues  . Hyperlipidemia   . Hypertension   . Sleep apnea    does not use CPAP  . Smoker    BP (!) 152/93 (BP Location: Left Arm, Patient Position: Sitting, Cuff Size: Normal)   Pulse 62   Resp 14   Ht 5\' 8"  (1.727 m)   Wt 189 lb (85.7 kg)   SpO2 98%   BMI 28.74 kg/m   Opioid Risk Score:   Fall Risk Score:  `1  Depression screen PHQ 2/9  Depression screen [facility] 2/9 11/30/2017 07/29/2017 02/16/2017 10/21/2016 08/21/2016 12/19/2015 07/03/2015  Decreased Interest 0 0 0 0 0 1 2  Down, Depressed, Hopeless 0 0 0 0 0 0 2  PHQ - 2 Score 0 0 0 0 0 1 4  Altered sleeping - - - - - - -  Tired, decreased energy - - - - - - -  Change in appetite - - - - - - -  Feeling bad or failure about yourself  - - - - - - -  Trouble concentrating - - - - - - -  Moving slowly or fidgety/restless - - - - - - -  Suicidal thoughts - - - - - - -  PHQ-9 Score - - - - - - -    Review of Systems  Constitutional: Positive for appetite change and diaphoresis.  HENT: Negative.   Eyes: Negative.   Respiratory: Positive for apnea.   Cardiovascular: Negative.   Gastrointestinal: Negative.   Endocrine: Negative.   Genitourinary: Negative.   Musculoskeletal: Positive for arthralgias, back pain and neck pain.       Spasms   Skin: Negative.   Allergic/Immunologic: Negative.   Neurological: Negative.        Tingling  Hematological: Negative.   Psychiatric/Behavioral: Negative.   All other systems reviewed and are negative.      Objective:   Physical Exam General: No acute distress HEENT: EOMI, oral membranes moist Cards: reg rate  Chest: normal effort Abdomen: Soft, NT, ND  Skin: dry,  intact Extremities: no edema  Skin:Clean and intact without signs of breakdown  Neuro:Normal cognitiion. Strength 5/5.Normal sensory function Musculoskeletal:Patient with elevation of the right hemipelvis once again.  He has taut right lumbar paraspinal muscles.  He has pain with palpation throughout the lower to mid lumbar spine.  He is able to actually bend fairly easily today and pick objects up off the floor.  He does have more pain in extension and with facet maneuvers however.  Psych:pleasant and cooperative   Assessment & Plan:  1. Chronic left inguinal pain- appears to be related to his prior inguinal hernia (indirect) and mesh repair. This pain appears to be more related to scar tissue, muscle imbalance, posture, etc. Pain worsens with activity. I have concerns this could be hip joint as well  2. Left rotator cuff syndrome/bursitis and associated left AC joint arthritis  3. Mild CTS  4.Lumbar spondylosis: Likely acute muscle strain in the setting of the spondylosis/facet arthropathy 5. Cervical myofascial painlikely as a result of poor posture and sleeping positions. 6. Left leg length discrepancy    Plan:  1. Reviewed lumbar xrays at length.  Findings are fairly benign  -needs to do facet exercises I provided  -General low back packet provided  -Exercise need to be performed on a daily basis.  I am not sure that his back pain is severe is a severe as he is claiming it is. 2. Continue neurontin at 300mg  bid to tid. 3. Refilled hydrocodone today #19for a two month rx. We will continue the controlled substance monitoring program, this consists of regular clinic visits, examinations, routine drug screening, pill counts as well as use of [place] Controlled Substance Reporting System. [place] today.   -Medication was refilled and a second prescription was sent to the patient's pharmacy for next month.    4.continue diclofenac at 75mg  bid for now.   Would like to move to as needed sometime in the near future. 5.  maintain Robaxin 500 mg 4 times daily.         6. Follow up with nurse practitioner in 2 month's time.  15 minutes of direct patient time was spent in the room today.

## 2018-01-03 ENCOUNTER — Encounter: Payer: Self-pay | Admitting: Nurse Practitioner

## 2018-01-03 ENCOUNTER — Ambulatory Visit (INDEPENDENT_AMBULATORY_CARE_PROVIDER_SITE_OTHER): Payer: Medicare Other | Admitting: Nurse Practitioner

## 2018-01-03 ENCOUNTER — Ambulatory Visit: Payer: Medicare Other | Admitting: Nurse Practitioner

## 2018-01-03 VITALS — BP 112/64 | HR 77 | Temp 98.4°F | Ht 68.0 in | Wt 183.2 lb

## 2018-01-03 DIAGNOSIS — Z125 Encounter for screening for malignant neoplasm of prostate: Secondary | ICD-10-CM

## 2018-01-03 DIAGNOSIS — I10 Essential (primary) hypertension: Secondary | ICD-10-CM | POA: Diagnosis not present

## 2018-01-03 DIAGNOSIS — M47817 Spondylosis without myelopathy or radiculopathy, lumbosacral region: Secondary | ICD-10-CM | POA: Diagnosis not present

## 2018-01-03 DIAGNOSIS — E782 Mixed hyperlipidemia: Secondary | ICD-10-CM

## 2018-01-03 MED ORDER — KETOROLAC TROMETHAMINE 30 MG/ML IJ SOLN
30.0000 mg | Freq: Once | INTRAMUSCULAR | Status: AC
  Administered 2018-01-03: 30 mg via INTRAMUSCULAR

## 2018-01-03 MED ORDER — ATORVASTATIN CALCIUM 40 MG PO TABS: 40.0000 mg | ORAL_TABLET | Freq: Every day | ORAL | 3 refills | Status: DC

## 2018-01-03 MED ORDER — LISINOPRIL 5 MG PO TABS: 5.0000 mg | ORAL_TABLET | Freq: Every day | ORAL | 3 refills | Status: DC

## 2018-01-03 NOTE — Progress Notes (Signed)
Subjective:  Patient ID: NATHYN LUIZ, male    DOB: 1955/05/23  Age: 62 y.o. MRN: [id number]  CC: Follow-up (F/U with HTN) and Medication Refill (would like a refill of ambien.)   HPI  HTN: Controlled with lisinopril. BP Readings from Last 3 Encounters:  01/03/18 112/64  12/29/17 (!) 152/93  11/30/17 (!) 154/90   Hyperlipidemia: Use of atorvastatin with no adverse effects. LDL not at goal. 59yrs cardiovascular risk:23.57% Continues to smoke 1ppd. No interest in quitting at this time. No regular exercise at this time due to chronic back pain. No change in diet. Lipid Panel     Component Value Date/Time   CHOL 176 07/15/2017 1053   TRIG 88.0 07/15/2017 1053   HDL 54.00 07/15/2017 1053   CHOLHDL 3 07/15/2017 1053   VLDL 17.6 07/15/2017 1053   LDLCALC 105 (H) 07/15/2017 1053   OSA: Admits to not using CPAP at all. Reports persistent insomnia and daytime somnolence. Advised him that I do not think it is safe to use any hypnotic in combination with muscle relaxant and narcotic, when noncompliant with CPAP; due to risk of over sedation and death.   Chronic Back pain: Asking for toradol injection due to acute exacerbation. Chronic pain is managed by Dr. Riley Kill with hydrocodone, gabapentin, diclofenac and robaxin. Also needs handicap placard form completed.  Reviewed past Medical, Social and Family history today.  Outpatient Medications Prior to Visit  Medication Sig Dispense Refill  . diclofenac (VOLTAREN) 75 MG EC tablet Take 1 tablet (75 mg total) by mouth 2 (two) times daily with a meal. 60 tablet 3  . gabapentin (NEURONTIN) 300 MG capsule Take 300 mg by mouth at bedtime as needed (pain). Bid to tid    . HYDROcodone-acetaminophen (NORCO) 10-325 MG tablet Take 1 tablet by mouth daily as needed (pain). May Take an extra tablet when pain is moderate to sever, 15 days out of the month 45 tablet 0  . hydrocortisone cream 1 % Apply 1 application topically 3 (three) times  daily as needed for itching.    . methocarbamol (ROBAXIN) 500 MG tablet Take 1 tablet (500 mg total) by mouth 4 (four) times daily. 120 tablet 1  . Multiple Vitamin (MULTIVITAMIN WITH MINERALS) TABS tablet Take 1 tablet by mouth daily.    . nicotine (NICODERM CQ - DOSED IN MG/24 HOURS) 21 mg/24hr patch Place 21 mg onto the skin daily.    [name] triamterene-hydrochlorothiazide (MAXZIDE-25) 37.5-25 MG tablet Take 1 tablet by mouth daily. 90 tablet 1  . atorvastatin (LIPITOR) 40 MG tablet Take 1 tablet (40 mg total) by mouth daily at 6 PM. 90 tablet 1  . lisinopril (PRINIVIL,ZESTRIL) 5 MG tablet Take 1 tablet (5 mg total) by mouth daily. 90 tablet 1  . zolpidem (AMBIEN) 5 MG tablet Take 1-2 tablets (5-10 mg) by mouth at bedtime as needed for sleep. 30 tablet 1   Facility-Administered Medications Prior to Visit  Medication Dose Route Frequency Provider Last Rate Last Dose  . 0.9 %  sodium chloride infusion  500 mL Intravenous Once Lynann Bologna, MD        ROS See HPI  Objective:  BP 112/64 (BP Location: Left Arm, Patient Position: Sitting, Cuff Size: Normal)   Pulse 77   Temp 98.4 F (36.9 C) (Oral)   Ht 5\' 8"  (1.727 m)   Wt 183 lb 3.2 oz (83.1 kg)   SpO2 96%   BMI 27.86 kg/m   BP Readings from Last 3 Encounters:  01/03/18 112/64  12/29/17 (!) 152/93  11/30/17 (!) 154/90    Wt Readings from Last 3 Encounters:  01/03/18 183 lb 3.2 oz (83.1 kg)  12/29/17 189 lb (85.7 kg)  11/30/17 190 lb (86.2 kg)    Physical Exam  Constitutional: He is oriented to person, place, and time. He appears well-developed and well-nourished.  Neck: No JVD present.  Cardiovascular: Normal rate, regular rhythm, normal heart sounds and intact distal pulses.  Pulmonary/Chest: Effort normal and breath sounds normal.  Musculoskeletal: He exhibits no edema.  Neurological: He is alert and oriented to person, place, and time.  Vitals reviewed.   Lab Results  Component Value Date   WBC 3.0 (L) 07/15/2017    HGB 13.2 07/15/2017   HCT 40.0 07/15/2017   PLT 156.0 07/15/2017   GLUCOSE 97 07/15/2017   CHOL 176 07/15/2017   TRIG 88.0 07/15/2017   HDL 54.00 07/15/2017   LDLCALC 105 (H) 07/15/2017   ALT 30 07/15/2017   AST 21 07/15/2017   NA 137 07/15/2017   K 4.3 07/15/2017   CL 103 07/15/2017   CREATININE 1.16 07/15/2017   BUN 23 07/15/2017   CO2 28 07/15/2017   TSH 0.64 10/30/2016   PSA 0.6 12/19/2015   HGBA1C 5.6 12/19/2015    Dg Lumbar Spine Complete  Result Date: 12/08/2017 CLINICAL DATA:  62 year old male with 3 weeks of progressed chronic back pain after working in yd, twisting injury. EXAM: LUMBAR SPINE - COMPLETE 4+ VIEW COMPARISON:  Lumbar radiographs 07/07/2016 and earlier. FINDINGS: Normal lumbar segmentation. Improved lordosis compared to 2018. Disc spaces are stable and relatively preserved. There is degenerative anterior endplate spurring, most pronounced at L1-L2 and L3-L4. No pars fracture. Mild bilateral L5-S1 facet hypertrophy. Visible lower thoracic levels appear intact. Sacral ala and SI joints appear normal. Negative abdominal visceral contours. Incidental pelvic phleboliths. IMPRESSION: 1.  No acute osseous abnormality identified in the lumbar spine. 2. Stable and relatively preserved disc spaces with lumbar endplate and facet hypertrophy. Electronically Signed   By: Odessa Fleming M.D.   On: 12/08/2017 15:23    Assessment & Plan:   Dakarai was seen today for follow-up and medication refill.  Diagnoses and all orders for this visit:  Essential hypertension -     lisinopril (PRINIVIL,ZESTRIL) 5 MG tablet; Take 1 tablet (5 mg total) by mouth daily. -     Cancel: Basic metabolic panel -     Basic metabolic panel; Future  Mixed hyperlipidemia -     atorvastatin (LIPITOR) 40 MG tablet; Take 1 tablet (40 mg total) by mouth daily at 6 PM. -     Cancel: Lipid panel -     Lipid panel; Future  Lumbosacral spondylosis without myelopathy -     ketorolac (TORADOL) 30 MG/ML  injection 30 mg  Prostate cancer screening -     Cancel: PSA -     PSA; Future   I have discontinued Kahari L. Facemire's zolpidem. I am also having him maintain his gabapentin, hydrocortisone cream, multivitamin with minerals, nicotine, triamterene-hydrochlorothiazide, methocarbamol, diclofenac, HYDROcodone-acetaminophen, lisinopril, and atorvastatin. We administered ketorolac. We will continue to administer sodium chloride.  Meds ordered this encounter  Medications  . lisinopril (PRINIVIL,ZESTRIL) 5 MG tablet    Sig: Take 1 tablet (5 mg total) by mouth daily.    Dispense:  90 tablet    Refill:  3    Order Specific Question:   Supervising Provider    Answer:   Dianne Dun [[id number]]  .  atorvastatin (LIPITOR) 40 MG tablet    Sig: Take 1 tablet (40 mg total) by mouth daily at 6 PM.    Dispense:  90 tablet    Refill:  3    Order Specific Question:   Supervising Provider    Answer:   Dianne Dun [[id number]]  . ketorolac (TORADOL) 30 MG/ML injection 30 mg    Follow-up: Return in about 6 months (around 07/05/2018) for CPE (fasting).  Alysia Penna, NP

## 2018-01-03 NOTE — Patient Instructions (Addendum)
Go to lab for blood draw. He requested to return to lab on Thursday for blood draw.  Heart Disease Prevention Heart disease is a leading cause of death. There are many things you can do to help prevent heart disease. Be physically active Physical activity is good for your heart. It helps control your blood pressure, cholesterol levels, and weight. Try to be physically active every day. Ask your health care provider what activities are best for you. Be a healthy weight Extra weight can strain your heart and affect your blood pressure and cholesterol levels. Lose weight with diet and exercise if recommended by your health care provider. Eat heart-healthy foods Follow a healthy eating plan as recommended by your health care provider or dietitian. Heart-healthy foods include:  High-fiber foods. These include oat bran, oatmeal, and whole-grain breads and cereals.  Fruits and vegetables.  Avoid:  Alcohol.  Fried foods.  Foods high in saturated fat. These include meats, butter, whole dairy products, shortening, and coconut or palm oil.  Salty foods. These include canned food, luncheon meat, salty snacks, and fast food.  Keep your cholesterol levels under control Cholesterol is a substance that is used for many important functions. When your cholesterol levels are high, cholesterol can stick to the insides of your blood vessels, making them narrow or clog. This can lead to chest pain (angina) and a heart attack. Keep your cholesterol levels under control as recommended by your health care provider. Have your cholesterol checked at least once a year. Target cholesterol levels (in mg/dL) for most people are:  Total cholesterol below 200.  LDL cholesterol below 100.  HDL cholesterol above 40 in men and above 50 in women.  Triglycerides below 150.  Keep your blood pressure under control Having high blood pressure (hypertension) puts you at risk for stroke and other forms of heart disease.  Keep your blood pressure under control as recommended by your health care provider. Ask your health care provider if you need treatment to lower your blood pressure. If you are 52-58 years of age, have your blood pressure checked every 3-5 years. If you are 4 years of age or older, have your blood pressure checked every year. Do not use tobacco products Tobacco smoke can damage your heart and blood vessels. Do not use any tobacco products including cigarettes, chewing tobacco, or electronic cigarettes. If you need help quitting, ask your health care provider. Take medicines as directed Take medicines only as directed by your health care provider. Ask your health care provider whether you should take an aspirin every day. Taking aspirin can help reduce your risk of heart disease and stroke. Where to find more information: To find out more about heart disease, visit the American Heart Association's website at www.americanheart.org This information is not intended to replace advice given to you by your health care provider. Make sure you discuss any questions you have with your health care provider. Document Released: 10/22/2003 Document Revised: 08/07/2015 Document Reviewed: 05/03/2013 Elsevier Interactive Patient Education  [employer].

## 2018-01-04 ENCOUNTER — Encounter: Payer: Self-pay | Admitting: Nurse Practitioner

## 2018-01-06 ENCOUNTER — Other Ambulatory Visit (INDEPENDENT_AMBULATORY_CARE_PROVIDER_SITE_OTHER): Payer: Medicare Other

## 2018-01-06 DIAGNOSIS — E782 Mixed hyperlipidemia: Secondary | ICD-10-CM

## 2018-01-06 DIAGNOSIS — I10 Essential (primary) hypertension: Secondary | ICD-10-CM | POA: Diagnosis not present

## 2018-01-06 DIAGNOSIS — Z125 Encounter for screening for malignant neoplasm of prostate: Secondary | ICD-10-CM

## 2018-01-06 LAB — BASIC METABOLIC PANEL
BUN: 17 mg/dL (ref 6–23)
CHLORIDE: 105 meq/L (ref 96–112)
CO2: 26 meq/L (ref 19–32)
Calcium: 9.2 mg/dL (ref 8.4–10.5)
Creatinine, Ser: 1.18 mg/dL (ref 0.40–1.50)
GFR: 80.28 mL/min (ref >60.00)
GLUCOSE: 117 mg/dL — AB (ref 70–99)
Potassium: 4 mEq/L (ref 3.5–5.1)
Sodium: 140 mEq/L (ref 135–145)

## 2018-01-06 LAB — LIPID PANEL
CHOLESTEROL: 177 mg/dL (ref 0–200)
HDL: 47.5 mg/dL (ref >39.00)
LDL Cholesterol: 90 mg/dL (ref 0–99)
NonHDL: 129.78
TRIGLYCERIDES: 197 mg/dL — AB (ref 0.0–149.0)
Total CHOL/HDL Ratio: 4
VLDL: 39.4 mg/dL (ref 0.0–40.0)

## 2018-01-06 LAB — PSA: PSA: 0.54 ng/mL (ref 0.10–4.00)

## 2018-02-10 ENCOUNTER — Other Ambulatory Visit: Payer: Self-pay | Admitting: Physical Medicine & Rehabilitation

## 2018-02-10 DIAGNOSIS — R1032 Left lower quadrant pain: Secondary | ICD-10-CM

## 2018-02-28 ENCOUNTER — Telehealth: Payer: Self-pay | Admitting: *Deleted

## 2018-02-28 ENCOUNTER — Other Ambulatory Visit: Payer: Self-pay

## 2018-02-28 ENCOUNTER — Encounter: Payer: Medicare Other | Attending: Physical Medicine & Rehabilitation | Admitting: Registered Nurse

## 2018-02-28 ENCOUNTER — Encounter: Payer: Self-pay | Admitting: Registered Nurse

## 2018-02-28 VITALS — BP 158/88 | HR 70 | Ht 68.0 in | Wt 190.8 lb

## 2018-02-28 DIAGNOSIS — M150 Primary generalized (osteo)arthritis: Secondary | ICD-10-CM | POA: Insufficient documentation

## 2018-02-28 DIAGNOSIS — Z79891 Long term (current) use of opiate analgesic: Secondary | ICD-10-CM

## 2018-02-28 DIAGNOSIS — M47817 Spondylosis without myelopathy or radiculopathy, lumbosacral region: Secondary | ICD-10-CM

## 2018-02-28 DIAGNOSIS — Z5181 Encounter for therapeutic drug level monitoring: Secondary | ICD-10-CM | POA: Diagnosis present

## 2018-02-28 DIAGNOSIS — G5790 Unspecified mononeuropathy of unspecified lower limb: Secondary | ICD-10-CM | POA: Diagnosis present

## 2018-02-28 DIAGNOSIS — R1030 Lower abdominal pain, unspecified: Secondary | ICD-10-CM | POA: Insufficient documentation

## 2018-02-28 DIAGNOSIS — G894 Chronic pain syndrome: Secondary | ICD-10-CM

## 2018-02-28 DIAGNOSIS — R1032 Left lower quadrant pain: Secondary | ICD-10-CM

## 2018-02-28 DIAGNOSIS — M7581 Other shoulder lesions, right shoulder: Secondary | ICD-10-CM | POA: Diagnosis present

## 2018-02-28 DIAGNOSIS — G8929 Other chronic pain: Secondary | ICD-10-CM

## 2018-02-28 DIAGNOSIS — M25511 Pain in right shoulder: Secondary | ICD-10-CM

## 2018-02-28 DIAGNOSIS — W19XXXS Unspecified fall, sequela: Secondary | ICD-10-CM

## 2018-02-28 DIAGNOSIS — M25512 Pain in left shoulder: Secondary | ICD-10-CM

## 2018-02-28 MED ORDER — HYDROCODONE-ACETAMINOPHEN 10-325 MG PO TABS: 1.0000 | ORAL_TABLET | Freq: Every day | ORAL | 0 refills | Status: DC | PRN

## 2018-02-28 NOTE — Telephone Encounter (Signed)
Mr Shaun Lang needs prior auth for his hydrocodone.

## 2018-02-28 NOTE — Progress Notes (Signed)
Subjective:    Patient ID: Shaun Lang, male    DOB: 1956-01-06, 62 y.o.   MRN: [id number]  HPI: Shaun Lang is a 62 y.o. male who returns for follow up appointment for chronic pain and medication refill. He states his  pain is located in his  right shoulder and lower back. Also reports the right shoulder pain has increased in intensity since his fall last week. He states last week Thursday he was getting out of the tub and slipped  And landed against the wall. We will order a X-ray, he verbalizes understanding. He rates his pain 9. His current exercise regime is walking and performing stretching exercises.  Mr. Fredin Morphine equivalent is 15.00  MME. Last UDS was Performed on 09/29/2017, it was consistent.   Pain Inventory Average Pain 10 Pain Right Now 9 My pain is burning and aching  In the last 24 hours, has pain interfered with the following? General activity 10 Relation with others 10 Enjoyment of life 10 What TIME of day is your pain at its worst? morning daytime evening Sleep (in general) Poor  Pain is worse with: bending, sitting, standing and some activites Pain improves with: medication Relief from Meds: 5  Mobility walk with assistance use a cane how many minutes can you walk? 5 do you drive?  no Do you have any goals in this area?  no  Function disabled: date disabled n/a  Neuro/Psych tingling trouble walking spasms  Prior Studies Any changes since last visit?  no  Physicians involved in your care Any changes since last visit?  no Primary care Bonna Gains, NP   Family History  Problem Relation Age of Onset  . Diabetes Mother   . Cancer Sister        breast  . Diabetes Sister   . Stroke Sister 50  . Breast cancer Sister   . Colon cancer Neg Hx   . Esophageal cancer Neg Hx   . Rectal cancer Neg Hx   . Stomach cancer Neg Hx    Social History   Socioeconomic History  . Marital status: Single    Spouse name: Not on file  . Number of  children: Not on file  . Years of education: Not on file  . Highest education level: Not on file  Occupational History    Comment: unemployed  Social Needs  . Financial resource strain: Not on file  . Food insecurity:    Worry: Not on file    Inability: Not on file  . Transportation needs:    Medical: Not on file    Non-medical: Not on file  Tobacco Use  . Smoking status: Current Every Day Smoker    Packs/day: 1.00    Types: Cigarettes  . Smokeless tobacco: Never Used  Substance and Sexual Activity  . Alcohol use: Yes    Alcohol/week: 3.0 standard drinks    Types: 3 Shots of liquor per week  . Drug use: No  . Sexual activity: Yes  Lifestyle  . Physical activity:    Days per week: Not on file    Minutes per session: Not on file  . Stress: Not on file  Relationships  . Social connections:    Talks on phone: Not on file    Gets together: Not on file    Attends religious service: Not on file    Active member of club or organization: Not on file    Attends meetings of clubs or organizations:  Not on file    Relationship status: Not on file  Other Topics Concern  . Not on file  Social History Narrative  . Not on file   Past Surgical History:  Procedure Laterality Date  . BUNIONECTOMY Left 07/15/2017   with big toe fracture repair  . COLONOSCOPY  07/18/2012   polyps-Dr.Outlaw  . HERNIA REPAIR  04/1998   [facility]  . NECK SURGERY  07/21/02 and 01/2006  . WRIST SURGERY  1992   right   Past Medical History:  Diagnosis Date  . Bell's palsy April 2010  . Chest pain with low risk for cardiac etiology    Low Risk Myoview Sept 2013  . DJD (degenerative joint disease)    disabilty secondary to neck issues  . Hyperlipidemia   . Hypertension   . Sleep apnea    does not use CPAP  . Smoker    BP (!) 158/88   Pulse 70   Ht 5\' 8"  (1.727 m)   Wt 190 lb 12.8 oz (86.5 kg)   SpO2 99%   BMI 29.01 kg/m   Opioid Risk Score:   Fall Risk Score:  `1  Depression screen PHQ  2/9  Depression screen [facility] 2/9 02/28/2018 11/30/2017 07/29/2017 02/16/2017 10/21/2016 08/21/2016 12/19/2015  Decreased Interest 0 0 0 0 0 0 1  Down, Depressed, Hopeless 0 0 0 0 0 0 0  PHQ - 2 Score 0 0 0 0 0 0 1  Altered sleeping - - - - - - -  Tired, decreased energy - - - - - - -  Change in appetite - - - - - - -  Feeling bad or failure about yourself  - - - - - - -  Trouble concentrating - - - - - - -  Moving slowly or fidgety/restless - - - - - - -  Suicidal thoughts - - - - - - -  PHQ-9 Score - - - - - - -    Review of Systems  Constitutional: Positive for appetite change and diaphoresis.  HENT: Negative.   Eyes: Negative.   Respiratory: Negative.   Cardiovascular: Negative.   Gastrointestinal: Negative.   Endocrine: Negative.   Genitourinary: Negative.   Musculoskeletal: Negative.   Skin: Negative.   Allergic/Immunologic: Negative.   Neurological: Negative.   Hematological: Negative.   Psychiatric/Behavioral: Negative.   All other systems reviewed and are negative.      Objective:   Physical Exam Vitals signs and nursing note reviewed.  Constitutional:      Appearance: Normal appearance.  Neck:     Musculoskeletal: Normal range of motion and neck supple.  Cardiovascular:     Rate and Rhythm: Normal rate and regular rhythm.     Pulses: Normal pulses.     Heart sounds: Normal heart sounds.  Pulmonary:     Effort: Pulmonary effort is normal.     Breath sounds: Normal breath sounds.  Musculoskeletal:     Comments: Normal Muscle Bulk and Muscle Testing Reveals:  Upper Extremities:  Full ROM and Muscle Strength 5/5 Right AC Joint Tenderness Lumbar Paraspinal Tenderness: L-4-L-5 Lower Extremities: Full ROM and Muscle Strength 5/5 Arises from chair with ease  Narrow Based Gait   Skin:    General: Skin is warm and dry.  Neurological:     Mental Status: He is alert and oriented to person, place, and time.  Psychiatric:        Mood and Affect: Mood normal.  Behavior: Behavior normal.           Assessment & Plan:  1. Chronic left inguinal pain: S/P inguinal hernia (indirect) and mesh repair. Continue to Monitor.02/28/2018 Refilled: Hydrocodone 10/325 mg one tablet daily as needed, may take and extra tablet when pain is moderate to sever 15 days out of the month. # 45. Second script sent for the following month.  We will continue the opioid monitoring program, this consists of regular clinic visits, examinations, urine drug screen, pill counts as well as use of [place] Cont[place]stance Reporting system. 2. Lumbosacral Spondylosis: Continue with Exercise regime. Continue to Monitor.02/28/2018 3. Muscle Spasm: No complaints today. Continue current medication regimen with Robaxin. Continue to monitor. 02/28/2018 4. Chronic Pain Syndrome: Continue Hydrocodone. 02/28/2018.  20 minutes of face to face patient care time was spent during this visit. All questions were encouraged and answered.  F/U in 2 months

## 2018-03-01 ENCOUNTER — Ambulatory Visit: Payer: Medicare Other | Admitting: Registered Nurse

## 2018-03-01 NOTE — Telephone Encounter (Signed)
Prior authorization submitted.

## 2018-03-05 LAB — TOXASSURE SELECT,+ANTIDEPR,UR

## 2018-03-07 ENCOUNTER — Telehealth: Payer: Self-pay | Admitting: *Deleted

## 2018-03-07 NOTE — Telephone Encounter (Signed)
Urine drug screen for this encounter is consistent for being negative for  prescribed medication. His last reported dose was on 02/23/18 and his test was on 02/28/18.

## 2018-04-01 ENCOUNTER — Telehealth: Payer: Self-pay | Admitting: *Deleted

## 2018-04-01 NOTE — Telephone Encounter (Signed)
Shaun Lang is asking if his medication is going to be at [facility] when he goes to get it. He said he called and they said no.  I have explained to him that when two rx are sent at the appt.  He has to go and ask them to fill his prescription and they search for it then, they do not automatically see it and fill it.  He should do that because it is there with do not fill before 1/6 on it.

## 2018-04-26 ENCOUNTER — Encounter: Payer: Self-pay | Admitting: Registered Nurse

## 2018-04-26 ENCOUNTER — Encounter: Payer: Medicare Other | Attending: Physical Medicine & Rehabilitation | Admitting: Registered Nurse

## 2018-04-26 VITALS — BP 137/86 | HR 62 | Ht 68.0 in | Wt 184.0 lb

## 2018-04-26 DIAGNOSIS — M150 Primary generalized (osteo)arthritis: Secondary | ICD-10-CM | POA: Insufficient documentation

## 2018-04-26 DIAGNOSIS — M47817 Spondylosis without myelopathy or radiculopathy, lumbosacral region: Secondary | ICD-10-CM | POA: Diagnosis not present

## 2018-04-26 DIAGNOSIS — Z5181 Encounter for therapeutic drug level monitoring: Secondary | ICD-10-CM | POA: Diagnosis not present

## 2018-04-26 DIAGNOSIS — M25511 Pain in right shoulder: Secondary | ICD-10-CM | POA: Diagnosis not present

## 2018-04-26 DIAGNOSIS — G8929 Other chronic pain: Secondary | ICD-10-CM

## 2018-04-26 DIAGNOSIS — M7918 Myalgia, other site: Secondary | ICD-10-CM

## 2018-04-26 DIAGNOSIS — R1030 Lower abdominal pain, unspecified: Secondary | ICD-10-CM | POA: Diagnosis present

## 2018-04-26 DIAGNOSIS — G5790 Unspecified mononeuropathy of unspecified lower limb: Secondary | ICD-10-CM | POA: Diagnosis present

## 2018-04-26 DIAGNOSIS — G894 Chronic pain syndrome: Secondary | ICD-10-CM

## 2018-04-26 DIAGNOSIS — M7581 Other shoulder lesions, right shoulder: Secondary | ICD-10-CM | POA: Insufficient documentation

## 2018-04-26 DIAGNOSIS — Z79891 Long term (current) use of opiate analgesic: Secondary | ICD-10-CM

## 2018-04-26 DIAGNOSIS — M25512 Pain in left shoulder: Secondary | ICD-10-CM

## 2018-04-26 MED ORDER — HYDROCODONE-ACETAMINOPHEN 10-325 MG PO TABS: 1.0000 | ORAL_TABLET | Freq: Every day | ORAL | 0 refills | Status: DC | PRN

## 2018-04-26 NOTE — Progress Notes (Signed)
Subjective:    Patient ID: Shaun Lang, male    DOB: [birth year], 63 y.o.   MRN: [id number]  HPI: Shaun Lang is a 63 y.o. male who returns for follow up appointment for chronic pain and medication refill. He states his pain is located in his bilateral shoulders R>L and lower back pain with sitting he reports. He rates his pain 7. His current exercise regime is walking and performing stretching exercises. Also states he will be res[facility] starting on 04/27/2018.   Shaun Lang Morphine equivalent is 15.00  MME. Last UDS was performed on 02/28/2018, it was consistent.   Pain Inventory Average Pain 7 Pain Right Now 7 My pain is burning, stabbing and aching  In the last 24 hours, has pain interfered with the following? General activity 7 Relation with others 7 Enjoyment of life 9 What TIME of day is your pain at its worst? morning Sleep (in general) Poor  Pain is worse with: bending, standing and some activites Pain improves with: medication Relief from Meds: 7  Mobility ability to climb steps?  yes do you drive?  no  Function disabled: date disabled .  Neuro/Psych tingling spasms  Prior Studies Any changes since last visit?  no  Physicians involved in your care Any changes since last visit?  no   Family History  Problem Relation Age of Onset  . Diabetes Mother   . Cancer Sister        breast  . Diabetes Sister   . Stroke Sister 440  . Breast cancer Sister   . Colon cancer Neg Hx   . Esophageal cancer Neg Hx   . Rectal cancer Neg Hx   . Stomach cancer Neg Hx    Social History   Socioeconomic History  . Marital status: Single    Spouse name: Not on file  . Number of children: Not on file  . Years of education: Not on file  . Highest education level: Not on file  Occupational History    Comment: unemployed  Social Needs  . Financial resource strain: Not on file  . Food insecurity:    Worry: Not on file    Inability: Not on file  .  Transportation needs:    Medical: Not on file    Non-medical: Not on file  Tobacco Use  . Smoking status: Current Every Day Smoker    Packs/day: 1.00    Types: Cigarettes  . Smokeless tobacco: Never Used  Substance and Sexual Activity  . Alcohol use: Yes    Alcohol/week: 3.0 standard drinks    Types: 3 Shots of liquor per week  . Drug use: No  . Sexual activity: Yes  Lifestyle  . Physical activity:    Days per week: Not on file    Minutes per session: Not on file  . Stress: Not on file  Relationships  . Social connections:    Talks on phone: Not on file    Gets together: Not on file    Attends religious service: Not on file    Active member of club or organization: Not on file    Attends meetings of clubs or organizations: Not on file    Relationship status: Not on file  Other Topics Concern  . Not on file  Social History Narrative  . Not on file   Past Surgical History:  Procedure Laterality Date  . BUNIONECTOMY Left 07/15/2017   with big toe fracture repair  .  COLONOSCOPY  07/18/2012   polyps-Dr.Outlaw  . HERNIA REPAIR  04/1998   [facility]  . NECK SURGERY  07/21/02 and 01/2006  . WRIST SURGERY  1992   right   Past Medical History:  Diagnosis Date  . Bell's palsy April 2010  . Chest pain with low risk for cardiac etiology    Low Risk Myoview Sept 2013  . DJD (degenerative joint disease)    disabilty secondary to neck issues  . Hyperlipidemia   . Hypertension   . Sleep apnea    does not use CPAP  . Smoker    There were no vitals taken for this visit.  Opioid Risk Score:   Fall Risk Score:  `1  Depression screen PHQ 2/9  Depression screen [facility] 2/9 02/28/2018 11/30/2017 07/29/2017 02/16/2017 10/21/2016 08/21/2016 12/19/2015  Decreased Interest 0 0 0 0 0 0 1  Down, Depressed, Hopeless 0 0 0 0 0 0 0  PHQ - 2 Score 0 0 0 0 0 0 1  Altered sleeping - - - - - - -  Tired, decreased energy - - - - - - -  Change in appetite - - - - - - -  Feeling bad or failure about  yourself  - - - - - - -  Trouble concentrating - - - - - - -  Moving slowly or fidgety/restless - - - - - - -  Suicidal thoughts - - - - - - -  PHQ-9 Score - - - - - - -     Review of Systems  Constitutional: Negative.   HENT: Negative.   Eyes: Negative.   Respiratory: Negative.   Cardiovascular: Negative.   Gastrointestinal: Negative.   Endocrine: Negative.   Genitourinary: Negative.   Musculoskeletal: Positive for arthralgias and myalgias.  Skin: Negative.   Allergic/Immunologic: Negative.   Neurological: Positive for numbness.  Hematological: Negative.   Psychiatric/Behavioral: Negative.   All other systems reviewed and are negative.      Objective:   Physical Exam Vitals signs and nursing note reviewed.  Neck:     Musculoskeletal: Normal range of motion.  Cardiovascular:     Rate and Rhythm: Normal rate and regular rhythm.     Pulses: Normal pulses.     Heart sounds: Normal heart sounds.  Pulmonary:     Effort: Pulmonary effort is normal.     Breath sounds: Normal breath sounds.  Musculoskeletal:     Comments: Normal Muscle Bulk and Muscle Testing Reveals:  Upper Extremities:Full ROM and Muscle Strength 5/5  Back without spinal tenderness Lower Extremities: Full ROM and Muscle Strength 5/5 Arises from chair with ease Narrow Based Gait   Skin:    General: Skin is warm and dry.  Neurological:     Mental Status: He is oriented to person, place, and time.  Psychiatric:        Mood and Affect: Mood normal.        Behavior: Behavior normal.           Assessment & Plan:  1. Chronic left inguinal pain: S/P inguinal hernia (indirect) and mesh repair. Continue to Monitor.04/26/2018. Refilled: Hydrocodone 10/325 mg one tablet daily as needed, may take and extra tablet when pain is moderate to sever 15 days out of the month.# 45.Second script sent for the following month. We will continue the opioid monitoring program, this consists of regular clinic visits,  examinations, urine drug screen, pill counts as well as use of [place] Controlled Substance  Reporting system. 2. Lumbosacral Spondylosis: Continue with Exercise regime. Continue to Monitor.04/26/2018 3. Muscle Spasm: No complaints today. Continue current medication regimen with Robaxin. Continue to monitor. 04/26/2018 4. Chronic Pain Syndrome: Continue Hydrocodone. 04/26/2018.  20 minutes of face to face patient care time was spent during this visit. All questions were encouraged and answered.  F/U in 2 months

## 2018-06-22 ENCOUNTER — Encounter: Payer: Medicare Other | Attending: Physical Medicine & Rehabilitation | Admitting: Physical Medicine & Rehabilitation

## 2018-06-22 ENCOUNTER — Other Ambulatory Visit: Payer: Self-pay

## 2018-06-22 ENCOUNTER — Encounter: Payer: Self-pay | Admitting: Physical Medicine & Rehabilitation

## 2018-06-22 DIAGNOSIS — R1032 Left lower quadrant pain: Secondary | ICD-10-CM | POA: Diagnosis not present

## 2018-06-22 DIAGNOSIS — Z5181 Encounter for therapeutic drug level monitoring: Secondary | ICD-10-CM | POA: Diagnosis not present

## 2018-06-22 DIAGNOSIS — M7581 Other shoulder lesions, right shoulder: Secondary | ICD-10-CM | POA: Insufficient documentation

## 2018-06-22 DIAGNOSIS — R1030 Lower abdominal pain, unspecified: Secondary | ICD-10-CM | POA: Insufficient documentation

## 2018-06-22 DIAGNOSIS — M47817 Spondylosis without myelopathy or radiculopathy, lumbosacral region: Secondary | ICD-10-CM

## 2018-06-22 DIAGNOSIS — G894 Chronic pain syndrome: Secondary | ICD-10-CM | POA: Diagnosis present

## 2018-06-22 DIAGNOSIS — M150 Primary generalized (osteo)arthritis: Secondary | ICD-10-CM | POA: Insufficient documentation

## 2018-06-22 DIAGNOSIS — G5790 Unspecified mononeuropathy of unspecified lower limb: Secondary | ICD-10-CM | POA: Diagnosis present

## 2018-06-22 MED ORDER — METHOCARBAMOL 500 MG PO TABS: 500.0000 mg | ORAL_TABLET | Freq: Four times a day (QID) | ORAL | 3 refills | Status: DC

## 2018-06-22 MED ORDER — HYDROCODONE-ACETAMINOPHEN 10-325 MG PO TABS: 1.0000 | ORAL_TABLET | Freq: Every day | ORAL | 0 refills | Status: DC | PRN

## 2018-06-22 NOTE — Progress Notes (Signed)
Subjective:    Patient ID: Shaun Lang, male    DOB: 01-01-1956, 63 y.o.   MRN: [id number]  HPI   Shaun Lang is here in follow-up of his chronic pain.  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  Since he started doing that has had increases in his back pain and pain in areas he never thought he could have pain in including his hands elbows his feet etc.  As of last week the job stopped because of the virus.  He has been trying to lay low since then.  He is doing some exercise and stretching at times although it does not sounds as if he is regular with it.  He remains on hydrocodone for breakthrough pain.  He is also taking diclofenac twice a day gabapentin as needed and Robaxin 4 times a day for spasms.  Pain Inventory Average Pain 8 Pain Right Now 8 My pain is sharp, dull, stabbing and aching  In the last 24 hours, has pain interfered with the following? General activity 7 Relation with others 9 Enjoyment of life 5 What TIME of day is your pain at its worst? morning daytime Sleep (in general) Poor  Pain is worse with: bending, sitting and some activites Pain improves with: medication Relief from Meds: 6  Mobility ability to climb steps?  yes  Function disabled: date disabled na  Neuro/Psych trouble walking spasms  Prior Studies Any changes since last visit?  no  Physicians involved in your care Any changes since last visit?  no   Family History  Problem Relation Age of Onset  . Diabetes Mother   . Cancer Sister        breast  . Diabetes Sister   . Stroke Sister 17  . Breast cancer Sister   . Colon cancer Neg Hx   . Esophageal cancer Neg Hx   . Rectal cancer Neg Hx   . Stomach cancer Neg Hx    Social History   Socioeconomic History  . Marital status: Single    Spouse name: Not on file  . Number of children: Not on file  . Years of education: Not on file  .  Highest education level: Not on file  Occupational History    Comment: unemployed  Social Needs  . Financial resource strain: Not on file  . Food insecurity:    Worry: Not on file    Inability: Not on file  . Transportation needs:    Medical: Not on file    Non-medical: Not on file  Tobacco Use  . Smoking status: Current Every Day Smoker    Packs/day: 1.00    Types: Cigarettes  . Smokeless tobacco: Never Used  Substance and Sexual Activity  . Alcohol use: Yes    Alcohol/week: 3.0 standard drinks    Types: 3 Shots of liquor per week  . Drug use: No  . Sexual activity: Yes  Lifestyle  . Physical activity:    Days per week: Not on file    Minutes per session: Not on file  . Stress: Not on file  Relationships  . Social connections:    Talks on phone: Not on file    Gets together: Not on file    Attends religious service: Not on file    Active member of club or organization: Not on file  Attends meetings of clubs or organizations: Not on file    Relationship status: Not on file  Other Topics Concern  . Not on file  Social History Narrative  . Not on file   Past Surgical History:  Procedure Laterality Date  . BUNIONECTOMY Left 07/15/2017   with big toe fracture repair  . COLONOSCOPY  07/18/2012   polyps-Dr.Outlaw  . HERNIA REPAIR  04/1998   [facility]  . NECK SURGERY  07/21/02 and 01/2006  . WRIST SURGERY  1992   right   Past Medical History:  Diagnosis Date  . Bell's palsy April 2010  . Chest pain with low risk for cardiac etiology    Low Risk Myoview Sept 2013  . DJD (degenerative joint disease)    disabilty secondary to neck issues  . Hyperlipidemia   . Hypertension   . Sleep apnea    does not use CPAP  . Smoker    BP (!) 163/82   Pulse 75   Temp 98.2 F (36.8 C)   Ht 5\' 8"  (1.727 m)   Wt 179 lb 12.8 oz (81.6 kg)   SpO2 99%   BMI 27.34 kg/m   Opioid Risk Score:   Fall Risk Score:  `1  Depression screen PHQ 2/9  Depression screen [facility] 2/9 06/22/2018  02/28/2018 11/30/2017 07/29/2017 02/16/2017 10/21/2016 08/21/2016  Decreased Interest 0 0 0 0 0 0 0  Down, Depressed, Hopeless 0 0 0 0 0 0 0  PHQ - 2 Score 0 0 0 0 0 0 0  Altered sleeping - - - - - - -  Tired, decreased energy - - - - - - -  Change in appetite - - - - - - -  Feeling bad or failure about yourself  - - - - - - -  Trouble concentrating - - - - - - -  Moving slowly or fidgety/restless - - - - - - -  Suicidal thoughts - - - - - - -  PHQ-9 Score - - - - - - -    Review of Systems  Constitutional: Positive for appetite change.  HENT: Negative.   Eyes: Negative.   Respiratory: Negative.   Gastrointestinal: Positive for constipation.  Endocrine: Negative.   Genitourinary: Negative.   Musculoskeletal: Positive for gait problem.  Skin: Negative.   Allergic/Immunologic: Negative.   Hematological: Negative.   Psychiatric/Behavioral: Negative.        Objective:   Physical Exam  General: No acute distress HEENT: EOMI, oral membranes moist Cards: reg rate  Chest: normal effort Abdomen: Soft, NT, ND Skin: dry, intact Extremities: no edema  Skin:Clean and intact without signs of breakdown  Neuro:Normal cognitiion. Strength 5/5.Normal sensory function Musculoskeletal:pain more with extension than flexion today.  He is easily able to touch his toes.[name].  Psych:pleasant and cooperative   Assessment & Plan:  1. Chronic left inguinal pain- appears to be related to his prior inguinal hernia (indirect) and mesh repair. This pain appears to be more related to scar tissue, muscle imbalance, posture, etc. Pain worsens with activity. I have concerns this could be hip joint as well  2. Left rotator cuff syndrome/bursitis and associated left AC joint arthritis  3. Mild CTS  4.Lumbar spondylosis:Likely acute muscle strain in the setting of the spondylosis/facet arthropathy 5. Cervical myofascial painlikely as a result of poor posture and sleeping positions. 6. Left leg length  discrepancy    Plan:  1.Continue HEP  -needs to use some common sense.  He  should really only be working sedentary jobs  -Again described extensive facet/low back stretches that he should be doing before and after activities. 2. Continue neurontin at 300mg  bid to tid. 3. Refilled hydrocodone today #28for a two month rx.   -We will continue the controlled substance monitoring program, this consists of regular clinic visits, examinations, routine drug screening, pill counts as well as use of [place] Controlled Substance Reporting System. [place].          -Medication was refilled and a second prescription was sent to the patient's pharmacy for next month.    4.continue diclofenac at 75mg  bid for now.   5.maintain Robaxin 500 mg 4 times daily. Refill this medication today as well  6. Follow up with nurse practitioner in 2 month's time. 15 minutes of direct patient time was spent in the room today.

## 2018-06-22 NOTE — Patient Instructions (Signed)
PLEASE FEEL FREE TO CALL OUR OFFICE WITH ANY PROBLEMS OR QUESTIONS ([phone])      

## 2018-06-27 ENCOUNTER — Other Ambulatory Visit: Payer: Self-pay | Admitting: Registered Nurse

## 2018-06-27 DIAGNOSIS — R1032 Left lower quadrant pain: Secondary | ICD-10-CM

## 2018-07-04 ENCOUNTER — Encounter: Payer: Self-pay | Admitting: Nurse Practitioner

## 2018-07-04 ENCOUNTER — Ambulatory Visit (INDEPENDENT_AMBULATORY_CARE_PROVIDER_SITE_OTHER): Payer: Medicare Other | Admitting: Nurse Practitioner

## 2018-07-04 DIAGNOSIS — E782 Mixed hyperlipidemia: Secondary | ICD-10-CM

## 2018-07-04 DIAGNOSIS — I10 Essential (primary) hypertension: Secondary | ICD-10-CM | POA: Diagnosis not present

## 2018-07-04 MED ORDER — TRIAMTERENE-HCTZ 37.5-25 MG PO TABS: 1.0000 | ORAL_TABLET | Freq: Every day | ORAL | 1 refills | Status: DC

## 2018-07-04 MED ORDER — ATORVASTATIN CALCIUM 40 MG PO TABS: 40.0000 mg | ORAL_TABLET | Freq: Every day | ORAL | 1 refills | Status: DC

## 2018-07-04 MED ORDER — LISINOPRIL 5 MG PO TABS: 5.0000 mg | ORAL_TABLET | Freq: Every day | ORAL | 1 refills | Status: DC

## 2018-07-04 NOTE — Progress Notes (Signed)
Virtual Visit via Video Note  I connected with Shaun Lang on 07/04/18 at  8:15 AM EDT by a video enabled telemedicine application and verified that I am speaking with the correct person using two identifiers.   I discussed the limitations of evaluation and management by telemedicine and the availability of in person appointments. The patient expressed understanding and agreed to proceed.  CC: follow up on HTN  History of Present Illness: HTN: Denies any acute complaint. Current use of lisinopril and maxzide. BP Readings from Last 3 Encounters:  07/04/18 (!) 148/90  06/22/18 (!) 163/82  04/26/18 137/86   Review of Systems  Constitutional: Negative.   Respiratory: Negative.   Cardiovascular: Negative.    Observations/Objective: Physical Exam  Constitutional: He is oriented to person, place, and time. No distress.  Eyes: Conjunctivae and EOM are normal.  Neck: Normal range of motion. Neck supple.  Pulmonary/Chest: Effort normal.  Neurological: He is alert and oriented to person, place, and time.  Psychiatric: He has a normal mood and affect. His behavior is normal.  Vitals reviewed.  Assessment and Plan: Shaun Lang was seen today for follow-up.  Diagnoses and all orders for this visit:  Mixed hyperlipidemia -     atorvastatin (LIPITOR) 40 MG tablet; Take 1 tablet (40 mg total) by mouth daily at 6 PM.  Essential hypertension -     lisinopril (PRINIVIL,ZESTRIL) 5 MG tablet; Take 1 tablet (5 mg total) by mouth daily. -     triamterene-hydrochlorothiazide (MAXZIDE-25) 37.5-25 MG tablet; Take 1 tablet by mouth daily.   Follow Up Instructions: F/up in 41month (fasting) Continue current medications.   I discussed the assessment and treatment plan with the patient. The patient was provided an opportunity to ask questions and all were answered. The patient agreed with the plan and demonstrated an understanding of the instructions.   The patient was advised to call back or seek  an in-person evaluation if the symptoms worsen or if the condition fails to improve as anticipated.   Alysia Penna, NP

## 2018-08-23 ENCOUNTER — Ambulatory Visit: Payer: Medicare Other | Admitting: Registered Nurse

## 2018-08-23 ENCOUNTER — Other Ambulatory Visit: Payer: Self-pay

## 2018-08-23 ENCOUNTER — Encounter: Payer: Medicare Other | Attending: Physical Medicine & Rehabilitation | Admitting: Registered Nurse

## 2018-08-23 VITALS — BP 137/87 | HR 61 | Temp 98.1°F | Ht 68.0 in | Wt 185.3 lb

## 2018-08-23 DIAGNOSIS — M7918 Myalgia, other site: Secondary | ICD-10-CM | POA: Diagnosis not present

## 2018-08-23 DIAGNOSIS — Z5181 Encounter for therapeutic drug level monitoring: Secondary | ICD-10-CM

## 2018-08-23 DIAGNOSIS — M150 Primary generalized (osteo)arthritis: Secondary | ICD-10-CM | POA: Diagnosis present

## 2018-08-23 DIAGNOSIS — R1032 Left lower quadrant pain: Secondary | ICD-10-CM

## 2018-08-23 DIAGNOSIS — G894 Chronic pain syndrome: Secondary | ICD-10-CM

## 2018-08-23 DIAGNOSIS — M25511 Pain in right shoulder: Secondary | ICD-10-CM

## 2018-08-23 DIAGNOSIS — M47817 Spondylosis without myelopathy or radiculopathy, lumbosacral region: Secondary | ICD-10-CM | POA: Diagnosis not present

## 2018-08-23 DIAGNOSIS — R1030 Lower abdominal pain, unspecified: Secondary | ICD-10-CM | POA: Diagnosis present

## 2018-08-23 DIAGNOSIS — G8929 Other chronic pain: Secondary | ICD-10-CM

## 2018-08-23 DIAGNOSIS — M7581 Other shoulder lesions, right shoulder: Secondary | ICD-10-CM | POA: Diagnosis present

## 2018-08-23 DIAGNOSIS — G5790 Unspecified mononeuropathy of unspecified lower limb: Secondary | ICD-10-CM | POA: Diagnosis present

## 2018-08-23 DIAGNOSIS — M25512 Pain in left shoulder: Secondary | ICD-10-CM | POA: Diagnosis not present

## 2018-08-23 DIAGNOSIS — Z79891 Long term (current) use of opiate analgesic: Secondary | ICD-10-CM

## 2018-08-23 MED ORDER — CYCLOBENZAPRINE HCL 10 MG PO TABS: ORAL_TABLET | ORAL | 2 refills | Status: DC

## 2018-08-23 MED ORDER — HYDROCODONE-ACETAMINOPHEN 10-325 MG PO TABS: 1.0000 | ORAL_TABLET | Freq: Every day | ORAL | 0 refills | Status: DC | PRN

## 2018-08-23 NOTE — Progress Notes (Signed)
Subjective:    Patient ID: Shaun Lang, male    DOB: Aug 17, 1955, 63 y.o.   MRN: [id number]  HPI: Shaun Lang is a 63 y.o. male who returns for follow up appointment for chronic pain and medication refill. He states his pain is located in his bilateral shoulders and lower back. He rates his pain 7. His current exercise regime is walking and performing stretching exercises.  [name] Morphine equivalent is 15.00  MME.  Last UDS was Performed on 02/28/2018, it was consistent.   Pain Inventory Average Pain 7 Pain Right Now 7 My pain is burning, tingling and aching  In the last 24 hours, has pain interfered with the following? General activity 7 Relation with others 8 Enjoyment of life 8 What TIME of day is your pain at its worst? morning and night Sleep (in general) Poor  Pain is worse with: walking, bending and standing Pain improves with: medication Relief from Meds: 5  Mobility how many minutes can you walk? 10 ability to climb steps?  yes do you drive?  no  Function disabled: date disabled na  Neuro/Psych numbness tingling spasms  Prior Studies Any changes since last visit?  no  Physicians involved in your care Any changes since last visit?  no   Family History  Problem Relation Age of Onset  . Diabetes Mother   . Cancer Sister        breast  . Diabetes Sister   . Stroke Sister [id number]  . Breast cancer Sister   . Colon cancer Neg Hx   . Esophageal cancer Neg Hx   . Rectal cancer Neg Hx   . Stomach cancer Neg Hx    Social History   Socioeconomic History  . Marital status: Single    Spouse name: Not on file  . Number of children: Not on file  . Years of education: Not on file  . Highest education level: Not on file  Occupational History    Comment: unemployed  Social Needs  . Financial resource strain: Not on file  . Food insecurity:    Worry: Not on file    Inability: Not on file  . Transportation needs:    Medical: Not on file   Non-medical: Not on file  Tobacco Use  . Smoking status: Current Every Day Smoker    Packs/day: 1.00    Types: Cigarettes  . Smokeless tobacco: Never Used  Substance and Sexual Activity  . Alcohol use: Yes    Alcohol/week: 3.0 standard drinks    Types: 3 Shots of liquor per week  . Drug use: No  . Sexual activity: Yes  Lifestyle  . Physical activity:    Days per week: Not on file    Minutes per session: Not on file  . Stress: Not on file  Relationships  . Social connections:    Talks on phone: Not on file    Gets together: Not on file    Attends religious service: Not on file    Active member of club or organization: Not on file    Attends meetings of clubs or organizations: Not on file    Relationship status: Not on file  Other Topics Concern  . Not on file  Social History Narrative  . Not on file   Past Surgical History:  Procedure Laterality Date  . BUNIONECTOMY Left 07/15/2017   with big toe fracture repair  . COLONOSCOPY  07/18/2012   polyps-Dr.Outlaw  . HERNIA REPAIR  04/1998   LI[facility]  . NECK SURGERY  07/21/02 and 01/2006  . WRIST SURGERY  1992   right   Past Medical History:  Diagnosis Date  . Bell's palsy April 2010  . Chest pain with low risk for cardiac etiology    Low Risk Myoview Sept 2013  . DJD (degenerative joint disease)    disabilty secondary to neck issues  . Hyperlipidemia   . Hypertension   . Sleep apnea    does not use CPAP  . Smoker    BP 137/87   Pulse 61   Temp 98.1 F (36.7 C)   Ht  (1.727 m)   Wt 185 lb 4.8 oz (84.1 kg)   SpO2 97%   BMI 28.17 kg/m   Opioid Risk Score:   Fall Risk Score:  `1  Depression screen PHQ 2/9  Depression screen [facility] 2/9 08/23/2018 06/22/2018 02/28/2018 11/30/2017 07/29/2017 02/16/2017 10/21/2016  Decreased Interest 0 0 0 0 0 0 0  Down, Depressed, Hopeless 0 0 0 0 0 0 0  PHQ - 2 Score 0 0 0 0 0 0 0  Altered sleeping - - - - - - -  Tired, decreased energy - - - - - - -  Change in appetite - - - - - - -   Feeling bad or failure about yourself  - - - - - - -  Trouble concentrating - - - - - - -  Moving slowly or fidgety/restless - - - - - - -  Suicidal thoughts - - - - - - -  PHQ-9 Score - - - - - - -    Review of Systems  Constitutional: Positive for appetite change and diaphoresis.  HENT: Negative.   Eyes: Negative.   Respiratory: Negative.   Cardiovascular: Negative.   Gastrointestinal: Negative.   Endocrine: Negative.   Genitourinary: Negative.   Musculoskeletal: Positive for back pain.       Spasms  Skin: Negative.   Neurological: Positive for numbness.       Tingling  Hematological: Negative.   Psychiatric/Behavioral: Negative.   All other systems reviewed and are negative.      Objective:   Physical Exam Constitutional:      Appearance: Normal appearance.  Neck:     Musculoskeletal: Normal range of motion and neck supple.  Cardiovascular:     Rate and Rhythm: Normal rate and regular rhythm.     Pulses: Normal pulses.     Heart sounds: Normal heart sounds.  Pulmonary:     Effort: Pulmonary effort is normal.     Breath sounds: Normal breath sounds.  Musculoskeletal:     Comments: Normal Muscle Bulk and Muscle Testing Reveals:  Upper Extremities: Full ROM and Muscle Strength 5/5 Back without spinal tenderness Lower Extremities: Full ROM and Muscle Strength  5/5 Arises from Table with ease Narrow Based Gait   Skin:    General: Skin is warm and dry.  Neurological:     Mental Status: He is alert and oriented to person, place, and time.  Psychiatric:        Mood and Affect: Mood normal.        Behavior: Behavior normal.           Assessment & Plan:  1. Chronic left inguinal pain: S/P inguinal hernia (indirect) and mesh repair. Continue to Monitor.08/23/2018. Refilled: Hydrocodone 10/325 mg one tablet daily as needed, may take and extra tablet when pain is moderate to sever 15  days out of the month.# 45.Second script sent for the following month. We  will continue the opioid monitoring program, this consists of regular clinic visits, examinations, urine drug screen, pill counts as well as use of [place] Controlled Substance Reporting system. 2. Lumbosacral Spondylosis: Continue with Exercise regime. Continue to Monitor.08/23/2018 3. Muscle Spasm:No complaints today.Continuecurrent medication regimen with Robaxin. Continue to monitor. 08/23/2018 4.Chronic Bilateral Shoulder Pain/ Chronic Pain Syndrome: Continue Hydrocodone. 08/23/2018.  15 minutes of face to face patient care time was spent during this visit. All questions were encouraged and answered.  F/U in 2 months

## 2018-08-24 ENCOUNTER — Encounter: Payer: Self-pay | Admitting: Registered Nurse

## 2018-09-13 ENCOUNTER — Telehealth: Payer: Self-pay | Admitting: Nurse Practitioner

## 2018-09-13 NOTE — Telephone Encounter (Signed)
I called and left message on patient voicemail to call office and reschedule appointment for 10/13/2018 with Charlotte Nche, provider will not be in office.  °

## 2018-10-06 ENCOUNTER — Telehealth: Payer: Self-pay | Admitting: Nurse Practitioner

## 2018-10-06 NOTE — Telephone Encounter (Signed)

## 2018-10-07 ENCOUNTER — Encounter: Payer: Self-pay | Admitting: Nurse Practitioner

## 2018-10-07 ENCOUNTER — Ambulatory Visit (INDEPENDENT_AMBULATORY_CARE_PROVIDER_SITE_OTHER): Payer: Medicare Other | Admitting: Nurse Practitioner

## 2018-10-07 ENCOUNTER — Other Ambulatory Visit: Payer: Self-pay

## 2018-10-07 VITALS — BP 134/88 | HR 85 | Temp 98.3°F | Ht 68.0 in | Wt 183.8 lb

## 2018-10-07 DIAGNOSIS — E782 Mixed hyperlipidemia: Secondary | ICD-10-CM | POA: Diagnosis not present

## 2018-10-07 DIAGNOSIS — I10 Essential (primary) hypertension: Secondary | ICD-10-CM | POA: Diagnosis not present

## 2018-10-07 LAB — BASIC METABOLIC PANEL
BUN: 12 mg/dL (ref 6–23)
CO2: 28 mEq/L (ref 19–32)
Calcium: 9.2 mg/dL (ref 8.4–10.5)
Chloride: 105 mEq/L (ref 96–112)
Creatinine, Ser: 1.07 mg/dL (ref 0.40–1.50)
GFR: 84.36 mL/min (ref >60.00)
Glucose, Bld: 97 mg/dL (ref 70–99)
Potassium: 4 mEq/L (ref 3.5–5.1)
Sodium: 138 mEq/L (ref 135–145)

## 2018-10-07 LAB — LIPID PANEL
Cholesterol: 200 mg/dL (ref 0–200)
HDL: 59.8 mg/dL (ref >39.00)
LDL Cholesterol: 125 mg/dL — ABNORMAL HIGH (ref 0–99)
NonHDL: 140.37
Total CHOL/HDL Ratio: 3
Triglycerides: 79 mg/dL (ref 0.0–149.0)
VLDL: 15.8 mg/dL (ref 0.0–40.0)

## 2018-10-07 LAB — HEPATIC FUNCTION PANEL
ALT: 18 U/L (ref 0–53)
AST: 16 U/L (ref 0–37)
Albumin: 4.7 g/dL (ref 3.5–5.2)
Alkaline Phosphatase: 55 U/L (ref 39–117)
Bilirubin, Direct: 0.1 mg/dL (ref 0.0–0.3)
Total Bilirubin: 0.4 mg/dL (ref 0.2–1.2)
Total Protein: 7.1 g/dL (ref 6.0–8.3)

## 2018-10-07 LAB — CK: Total CK: 145 U/L (ref 7–232)

## 2018-10-07 MED ORDER — TRIAMTERENE-HCTZ 37.5-25 MG PO TABS: 1.0000 | ORAL_TABLET | Freq: Every day | ORAL | 1 refills | Status: DC

## 2018-10-07 MED ORDER — ATORVASTATIN CALCIUM 40 MG PO TABS: 40.0000 mg | ORAL_TABLET | Freq: Every day | ORAL | 3 refills | Status: DC

## 2018-10-07 MED ORDER — LISINOPRIL 5 MG PO TABS: 5.0000 mg | ORAL_TABLET | Freq: Every day | ORAL | 3 refills | Status: DC

## 2018-10-07 NOTE — Patient Instructions (Addendum)
Stable lab results. Continue current medications

## 2018-10-07 NOTE — Progress Notes (Signed)
Subjective:  Patient ID: Shaun Lang Vitali, male    DOB: 09-30-1955  Age: 63 y.o. MRN: [id number]  CC: Follow-up (HTN and, hyperlipidemia, had coffee with cream and sugar.)  HPI  HTN: Stable with lisinopril and maxzide. BP Readings from Last 3 Encounters:  10/07/18 134/88  08/23/18 137/87  07/04/18 (!) 148/90   Hyperlipidemia: LDL at goal and elevated triglyceride. Reports inconsistent use of lipitor and continue tobacco use. No claudication, no myalgia. Lipid Panel     Component Value Date/Time   CHOL 200 10/07/2018 1027   TRIG 79.0 10/07/2018 1027   HDL 59.80 10/07/2018 1027   CHOLHDL 3 10/07/2018 1027   VLDL 15.8 10/07/2018 1027   LDLCALC 125 (H) 10/07/2018 1027   Reviewed past Medical, Social and Family history today.  Outpatient Medications Prior to Visit  Medication Sig Dispense Refill  . cyclobenzaprine (FLEXERIL) 10 MG tablet TAKE 1 TABLET BY MOUTH TWICE DAILY AS NEEDED FOR MUSCLE SPASM 60 tablet 2  . diclofenac (VOLTAREN) 75 MG EC tablet Take 1 tablet (75 mg total) by mouth 2 (two) times daily with a meal. 60 tablet 3  . gabapentin (NEURONTIN) 300 MG capsule Take 300 mg by mouth at bedtime as needed (pain). Bid to tid    . HYDROcodone-acetaminophen (NORCO) 10-325 MG tablet Take 1 tablet by mouth daily as needed (pain). May Take an extra tablet when pain is moderate to sever, 15 days out of the month 45 tablet 0  . methocarbamol (ROBAXIN) 500 MG tablet Take 1 tablet (500 mg total) by mouth 4 (four) times daily. 120 tablet 3  . Multiple Vitamin (MULTIVITAMIN WITH MINERALS) TABS tablet Take 1 tablet by mouth daily.    . nicotine (NICODERM CQ - DOSED IN MG/24 HOURS) 21 mg/24hr patch Place 21 mg onto the skin daily.    [name]. atorvastatin (LIPITOR) 40 MG tablet Take 1 tablet (40 mg total) by mouth daily at 6 PM. 90 tablet 1  . lisinopril (PRINIVIL,ZESTRIL) 5 MG tablet Take 1 tablet (5 mg total) by mouth daily. 90 tablet 1  . triamterene-hydrochlorothiazide (MAXZIDE-25) 37.5-25  MG tablet Take 1 tablet by mouth daily. 90 tablet 1  . hydrocortisone cream 1 % Apply 1 application topically 3 (three) times daily as needed for itching.     No facility-administered medications prior to visit.     ROS See HPI  Objective:  BP 134/88   Pulse 85   Temp 98.3 F (36.8 C) (Oral)   Ht 5\' 8"  (1.727 m)   Wt 183 lb 12.8 oz (83.4 kg)   SpO2 96%   BMI 27.95 kg/m   BP Readings from Last 3 Encounters:  10/07/18 134/88  08/23/18 137/87  07/04/18 (!) 148/90    Wt Readings from Last 3 Encounters:  10/07/18 183 lb 12.8 oz (83.4 kg)  08/23/18 185 lb 4.8 oz (84.1 kg)  07/04/18 180 lb (81.6 kg)    Physical Exam Vitals signs reviewed.  Cardiovascular:     Rate and Rhythm: Normal rate and regular rhythm.  Pulmonary:     Effort: Pulmonary effort is normal.     Breath sounds: Normal breath sounds.  Musculoskeletal:     Right lower leg: No edema.     Left lower leg: No edema.  Neurological:     Mental Status: He is alert and oriented to person, place, and time.  Psychiatric:        Mood and Affect: Mood normal.        Behavior: Behavior  normal.        Thought Content: Thought content normal.     Lab Results  Component Value Date   WBC 3.0 (Lang) 07/15/2017   HGB 13.2 07/15/2017   HCT 40.0 07/15/2017   PLT 156.0 07/15/2017   GLUCOSE 97 10/07/2018   CHOL 200 10/07/2018   TRIG 79.0 10/07/2018   HDL 59.80 10/07/2018   LDLCALC 125 (H) 10/07/2018   ALT 18 10/07/2018   AST 16 10/07/2018   NA 138 10/07/2018   K 4.0 10/07/2018   CL 105 10/07/2018   CREATININE 1.07 10/07/2018   BUN 12 10/07/2018   CO2 28 10/07/2018   TSH 0.64 10/30/2016   PSA 0.54 01/06/2018   HGBA1C 5.6 12/19/2015    Dg Lumbar Spine Complete  Result Date: 12/08/2017 CLINICAL DATA:  63 year old male with 3 weeks of progressed chronic back pain after working in yd, twisting injury. EXAM: LUMBAR SPINE - COMPLETE 4+ VIEW COMPARISON:  Lumbar radiographs 07/07/2016 and earlier. FINDINGS: Normal  lumbar segmentation. Improved lordosis compared to 2018. Disc spaces are stable and relatively preserved. There is degenerative anterior endplate spurring, most pronounced at L1-L2 and L3-L4. No pars fracture. Mild bilateral L5-S1 facet hypertrophy. Visible lower thoracic levels appear intact. Sacral ala and SI joints appear normal. Negative abdominal visceral contours. Incidental pelvic phleboliths. IMPRESSION: 1.  No acute osseous abnormality identified in the lumbar spine. 2. Stable and relatively preserved disc spaces with lumbar endplate and facet hypertrophy. Electronically Signed   By: Odessa FlemingH  Hall M.D.   On: 12/08/2017 15:23    Assessment & Plan:   [name] was seen today for follow-up.  Diagnoses and all orders for this visit:  Essential hypertension -     Basic metabolic panel -     lisinopril (ZESTRIL) 5 MG tablet; Take 1 tablet (5 mg total) by mouth daily. -     triamterene-hydrochlorothiazide (MAXZIDE-25) 37.5-25 MG tablet; Take 1 tablet by mouth daily.  Mixed hyperlipidemia -     Lipid panel -     Hepatic function panel -     CK -     atorvastatin (LIPITOR) 40 MG tablet; Take 1 tablet (40 mg total) by mouth daily at 6 PM.   I have changed Aleric Lang. Nace's lisinopril. I am also having him maintain his gabapentin, hydrocortisone cream, multivitamin with minerals, nicotine, diclofenac, methocarbamol, cyclobenzaprine, HYDROcodone-acetaminophen, atorvastatin, and triamterene-hydrochlorothiazide.  Meds ordered this encounter  Medications  . atorvastatin (LIPITOR) 40 MG tablet    Sig: Take 1 tablet (40 mg total) by mouth daily at 6 PM.    Dispense:  90 tablet    Refill:  3    Order Specific Question:   Supervising Provider    Answer:   MATTHEWS, CODY [[id number]]  . lisinopril (ZESTRIL) 5 MG tablet    Sig: Take 1 tablet (5 mg total) by mouth daily.    Dispense:  90 tablet    Refill:  3    Order Specific Question:   Supervising Provider    Answer:   MATTHEWS, CODY [[id number]]  .  triamterene-hydrochlorothiazide (MAXZIDE-25) 37.5-25 MG tablet    Sig: Take 1 tablet by mouth daily.    Dispense:  90 tablet    Refill:  1    Order Specific Question:   Supervising Provider    Answer:   MATTHEWS, CODY [[id number]]    Problem List Items Addressed This Visit      Cardiovascular and Mediastinum   Hypertension - Primary   Relevant Medications  atorvastatin (LIPITOR) 40 MG tablet   lisinopril (ZESTRIL) 5 MG tablet   triamterene-hydrochlorothiazide (MAXZIDE-25) 37.5-25 MG tablet   Other Relevant Orders   Basic metabolic panel (Completed)     Other   Hyperlipidemia   Relevant Medications   atorvastatin (LIPITOR) 40 MG tablet   lisinopril (ZESTRIL) 5 MG tablet   triamterene-hydrochlorothiazide (MAXZIDE-25) 37.5-25 MG tablet   Other Relevant Orders   Lipid panel (Completed)   Hepatic function panel (Completed)   CK (Completed)       Follow-up: Return in about 6 months (around 04/09/2019) for HTN, hyperlipidemia.  Wilfred Lacy, NP

## 2018-10-13 ENCOUNTER — Ambulatory Visit: Payer: Medicare Other | Admitting: Nurse Practitioner

## 2018-10-20 ENCOUNTER — Encounter: Payer: Medicare Other | Attending: Physical Medicine & Rehabilitation | Admitting: Registered Nurse

## 2018-10-20 ENCOUNTER — Other Ambulatory Visit: Payer: Self-pay

## 2018-10-20 ENCOUNTER — Encounter: Payer: Self-pay | Admitting: Registered Nurse

## 2018-10-20 VITALS — BP 124/80 | HR 72 | Temp 98.2°F | Resp 12 | Ht 68.0 in | Wt 184.0 lb

## 2018-10-20 DIAGNOSIS — M47817 Spondylosis without myelopathy or radiculopathy, lumbosacral region: Secondary | ICD-10-CM | POA: Diagnosis not present

## 2018-10-20 DIAGNOSIS — M150 Primary generalized (osteo)arthritis: Secondary | ICD-10-CM | POA: Insufficient documentation

## 2018-10-20 DIAGNOSIS — M7918 Myalgia, other site: Secondary | ICD-10-CM | POA: Diagnosis not present

## 2018-10-20 DIAGNOSIS — Z79891 Long term (current) use of opiate analgesic: Secondary | ICD-10-CM

## 2018-10-20 DIAGNOSIS — R1030 Lower abdominal pain, unspecified: Secondary | ICD-10-CM | POA: Diagnosis present

## 2018-10-20 DIAGNOSIS — M25512 Pain in left shoulder: Secondary | ICD-10-CM

## 2018-10-20 DIAGNOSIS — Z5181 Encounter for therapeutic drug level monitoring: Secondary | ICD-10-CM | POA: Insufficient documentation

## 2018-10-20 DIAGNOSIS — G8929 Other chronic pain: Secondary | ICD-10-CM

## 2018-10-20 DIAGNOSIS — G5790 Unspecified mononeuropathy of unspecified lower limb: Secondary | ICD-10-CM | POA: Insufficient documentation

## 2018-10-20 DIAGNOSIS — M7581 Other shoulder lesions, right shoulder: Secondary | ICD-10-CM | POA: Diagnosis present

## 2018-10-20 DIAGNOSIS — R1032 Left lower quadrant pain: Secondary | ICD-10-CM

## 2018-10-20 DIAGNOSIS — G894 Chronic pain syndrome: Secondary | ICD-10-CM | POA: Diagnosis present

## 2018-10-20 DIAGNOSIS — M25511 Pain in right shoulder: Secondary | ICD-10-CM | POA: Diagnosis not present

## 2018-10-20 MED ORDER — HYDROCODONE-ACETAMINOPHEN 10-325 MG PO TABS: 1.0000 | ORAL_TABLET | Freq: Every day | ORAL | 0 refills | Status: DC | PRN

## 2018-10-20 NOTE — Progress Notes (Signed)
Subjective:    Patient ID: Shaun RichardsCurtis L Cardell, male    DOB: Mar 19, 1956, 63 y.o.   MRN: [id number]  HPI: Shaun RichardsCurtis L Mcclurg is a 63 y.o. male who returns for follow up appointment for chronic pain and medication refill. He states his pain is located in his bilateral shoulders, lower back pain and right groin pain. He rates his  Pain 8. His current exercise regime is walking and performing stretching exercises.  Mr. Mariane Baumgartenbmey Morphine equivalent is 15.00MME.  UDS ordered today.   Pain Inventory Average Pain 7 Pain Right Now 8 My pain is sharp, stabbing and tingling  In the last 24 hours, has pain interfered with the following? General activity 8 Relation with others 8 Enjoyment of life 10 What TIME of day is your pain at its worst? daytime Sleep (in general) Poor  Pain is worse with: bending, standing and some activites Pain improves with: pacing activities and medication Relief from Meds: n/a  Mobility ability to climb steps?  yes do you drive?  no  Function disabled: date disabled n/a  Neuro/Psych No problems in this area  Prior Studies n/a  Physicians involved in your care n/a   Family History  Problem Relation Age of Onset  . Diabetes Mother   . Cancer Sister        breast  . Diabetes Sister   . Stroke Sister [id number]  . Breast cancer Sister   . Colon cancer Neg Hx   . Esophageal cancer Neg Hx   . Rectal cancer Neg Hx   . Stomach cancer Neg Hx    Social History   Socioeconomic History  . Marital status: Single    Spouse name: Not on file  . Number of children: Not on file  . Years of education: Not on file  . Highest education level: Not on file  Occupational History    Comment: unemployed  Social Needs  . Financial resource strain: Not on file  . Food insecurity    Worry: Not on file    Inability: Not on file  . Transportation needs    Medical: Not on file    Non-medical: Not on file  Tobacco Use  . Smoking status: Current Every Day Smoker    Packs/day:  1.00    Types: Cigarettes  . Smokeless tobacco: Never Used  Substance and Sexual Activity  . Alcohol use: Yes    Alcohol/week: 3.0 standard drinks    Types: 3 Shots of liquor per week  . Drug use: No  . Sexual activity: Yes  Lifestyle  . Physical activity    Days per week: Not on file    Minutes per session: Not on file  . Stress: Not on file  Relationships  . Social Musicianconnections    Talks on phone: Not on file    Gets together: Not on file    Attends religious service: Not on file    Active member of club or organization: Not on file    Attends meetings of clubs or organizations: Not on file    Relationship status: Not on file  Other Topics Concern  . Not on file  Social History Narrative  . Not on file   Past Surgical History:  Procedure Laterality Date  . BUNIONECTOMY Left 07/15/2017   with big toe fracture repair  . COLONOSCOPY  07/18/2012   polyps-Dr.Outlaw  . HERNIA REPAIR  04/1998   LIH  . NECK SURGERY  07/21/02 and 01/2006  . WRIST SURGERY  1992   right   Past Medical History:  Diagnosis Date  . Bell's palsy April 2010  . Chest pain with low risk for cardiac etiology    Low Risk Myoview Sept 2013  . DJD (degenerative joint disease)    disabilty secondary to neck issues  . Hyperlipidemia   . Hypertension   . Sleep apnea    does not use CPAP  . Smoker    There were no vitals taken for this visit.  Opioid Risk Score:   Fall Risk Score:  `1  Depression screen PHQ 2/9  Depression screen [facility] 2/9 08/23/2018 06/22/2018 02/28/2018 11/30/2017 07/29/2017 02/16/2017 10/21/2016  Decreased Interest 0 0 0 0 0 0 0  Down, Depressed, Hopeless 0 0 0 0 0 0 0  PHQ - 2 Score 0 0 0 0 0 0 0  Altered sleeping - - - - - - -  Tired, decreased energy - - - - - - -  Change in appetite - - - - - - -  Feeling bad or failure about yourself  - - - - - - -  Trouble concentrating - - - - - - -  Moving slowly or fidgety/restless - - - - - - -  Suicidal thoughts - - - - - - -  PHQ-9 Score  - - - - - - -      Review of Systems  Constitutional: Positive for appetite change.  Respiratory: Positive for apnea.   Gastrointestinal: Positive for constipation.       Objective:   Physical Exam Vitals signs and nursing note reviewed.  Constitutional:      Appearance: Normal appearance.  Neck:     Musculoskeletal: Normal range of motion and neck supple.  Cardiovascular:     Rate and Rhythm: Normal rate and regular rhythm.     Pulses: Normal pulses.     Heart sounds: Normal heart sounds.  Pulmonary:     Effort: Pulmonary effort is normal.     Breath sounds: Normal breath sounds.  Musculoskeletal:     Comments: Normal Muscle Bulk and Muscle Testing Reveals:  Upper Extremities: Full ROM and Muscle Strength 5/5   Back without spinal Tenderness Noted Lower Extremities: Full ROM and Muscle Strength 5/5 Arises from chair with ease Narrow Based Gait   Skin:    General: Skin is warm and dry.  Neurological:     Mental Status: He is alert and oriented to person, place, and time.  Psychiatric:        Mood and Affect: Mood normal.        Behavior: Behavior normal.           Assessment & Plan:  1. Chronic left inguinal pain: S/P inguinal hernia (indirect) and mesh repair. Continue to Monitor.10/20/2018. Refilled: Hydrocodone 10/325 mg one tablet daily as needed, may take and extra tablet when pain is moderate to sever 15 days out of the month.# 38.Second script sent for the following month. We will continue the opioid monitoring program, this consists of regular clinic visits, examinations, urine drug screen, pill counts as well as use of [place] Controlled Substance Reporting system. 2. Lumbosacral Spondylosis: Continue with Exercise regime. Continue to Monitor.10/20/2018 3. Muscle Spasm:No complaints today.Continuecurrent medication regimen with Robaxin. Continue to monitor.10/20/2018 4.Chronic Bilateral Shoulder Pain/ Chronic Pain Syndrome: Continue  Hydrocodone. 10/20/2018.  15 minutes of face to face patient care time was spent during this visit. All questions were encouraged and answered.  F/U in 2  months

## 2018-10-27 LAB — TOXASSURE SELECT,+ANTIDEPR,UR

## 2018-10-28 ENCOUNTER — Telehealth: Payer: Self-pay | Admitting: *Deleted

## 2018-10-28 NOTE — Telephone Encounter (Signed)
Urine drug screen for this encounter is consistent for prescribed medication 

## 2018-12-14 ENCOUNTER — Other Ambulatory Visit: Payer: Self-pay

## 2018-12-14 ENCOUNTER — Encounter: Payer: Self-pay | Admitting: Registered Nurse

## 2018-12-14 ENCOUNTER — Encounter: Payer: Medicare Other | Attending: Physical Medicine & Rehabilitation | Admitting: Registered Nurse

## 2018-12-14 VITALS — BP 136/89 | HR 73 | Temp 97.9°F | Ht 68.0 in | Wt 192.0 lb

## 2018-12-14 DIAGNOSIS — M7581 Other shoulder lesions, right shoulder: Secondary | ICD-10-CM | POA: Diagnosis present

## 2018-12-14 DIAGNOSIS — G5790 Unspecified mononeuropathy of unspecified lower limb: Secondary | ICD-10-CM | POA: Insufficient documentation

## 2018-12-14 DIAGNOSIS — Z5181 Encounter for therapeutic drug level monitoring: Secondary | ICD-10-CM | POA: Insufficient documentation

## 2018-12-14 DIAGNOSIS — G894 Chronic pain syndrome: Secondary | ICD-10-CM | POA: Insufficient documentation

## 2018-12-14 DIAGNOSIS — M47817 Spondylosis without myelopathy or radiculopathy, lumbosacral region: Secondary | ICD-10-CM

## 2018-12-14 DIAGNOSIS — M7918 Myalgia, other site: Secondary | ICD-10-CM

## 2018-12-14 DIAGNOSIS — M150 Primary generalized (osteo)arthritis: Secondary | ICD-10-CM | POA: Insufficient documentation

## 2018-12-14 DIAGNOSIS — Z79891 Long term (current) use of opiate analgesic: Secondary | ICD-10-CM

## 2018-12-14 DIAGNOSIS — R1030 Lower abdominal pain, unspecified: Secondary | ICD-10-CM | POA: Diagnosis present

## 2018-12-14 DIAGNOSIS — M542 Cervicalgia: Secondary | ICD-10-CM

## 2018-12-14 MED ORDER — HYDROCODONE-ACETAMINOPHEN 10-325 MG PO TABS: 1.0000 | ORAL_TABLET | Freq: Every day | ORAL | 0 refills | Status: DC | PRN

## 2018-12-14 NOTE — Progress Notes (Signed)
Subjective:     Patient ID: Shaun Lang, male   DOB: Jun 02, 1955, 63 y.o.   MRN: [id number]  HPI: Shaun Lang is a 63 y.o. male who returns for follow up appointment for chronic pain and medication refill. He states his pain is located in his neck and lower back.He rates his pain 7. His current exercise regime is walking and performing stretching exercises.  Shaun Lang Morphine equivalent is 15.00  MME.  Last UDS was Performed on 10/20/2018, it was consistent.   Pain Inventory Average Pain 6 Pain Right Now 7 My pain is sharp and aching  In the last 24 hours, has pain interfered with the following? General activity 7 Relation with others 9 Enjoyment of life 6 What TIME of day is your pain at its worst? morning, evening Sleep (in general) Poor  Pain is worse with: standing and some activites Pain improves with: pacing activities and medication Relief from Meds: 6  Mobility walk without assistance how many minutes can you walk? 8 ability to climb steps?  yes do you drive?  no Do you have any goals in this area?  yes  Function disabled: date disabled .  Neuro/Psych spasms  Prior Studies Any changes since last visit?  no  Physicians involved in your care Any changes since last visit?  no   Family History  Problem Relation Age of Onset  . Diabetes Mother   . Cancer Sister        breast  . Diabetes Sister   . Stroke Sister 7  . Breast cancer Sister   . Colon cancer Neg Hx   . Esophageal cancer Neg Hx   . Rectal cancer Neg Hx   . Stomach cancer Neg Hx    Social History   Socioeconomic History  . Marital status: Single    Spouse name: Not on file  . Number of children: Not on file  . Years of education: Not on file  . Highest education level: Not on file  Occupational History    Comment: unemployed  Social Needs  . Financial resource strain: Not on file  . Food insecurity    Worry: Not on file    Inability: Not on file  . Transportation needs     Medical: Not on file    Non-medical: Not on file  Tobacco Use  . Smoking status: Current Every Day Smoker    Packs/day: 1.00    Types: Cigarettes  . Smokeless tobacco: Never Used  Substance and Sexual Activity  . Alcohol use: Yes    Alcohol/week: 3.0 standard drinks    Types: 3 Shots of liquor per week  . Drug use: No  . Sexual activity: Yes  Lifestyle  . Physical activity    Days per week: Not on file    Minutes per session: Not on file  . Stress: Not on file  Relationships  . Social Musician on phone: Not on file    Gets together: Not on file    Attends religious service: Not on file    Active member of club or organization: Not on file    Attends meetings of clubs or organizations: Not on file    Relationship status: Not on file  Other Topics Concern  . Not on file  Social History Narrative  . Not on file   Past Surgical History:  Procedure Laterality Date  . BUNIONECTOMY Left 07/15/2017   with big toe fracture repair  .  COLONOSCOPY  07/18/2012   polyps-Dr.Outlaw  . HERNIA REPAIR  04/1998   LI[facility]  . NECK SURGERY  07/21/02 and 01/2006  . WRIST SURGERY  1992   right   Past Medical History:  Diagnosis Date  . Bell's palsy April 2010  . Chest pain with low risk for cardiac etiology    Low Risk Myoview Sept 2013  . DJD (degenerative joint disease)    disabilty secondary to neck issues  . Hyperlipidemia   . Hypertension   . Sleep apnea    does not use CPAP  . Smoker    BP 136/89   Pulse 73   Temp 97.9 F (36.6 C)   Ht 5\' 8"  (1.727 m)   Wt 192 lb (87.1 kg)   SpO2 97%   BMI 29.19 kg/m   Opioid Risk Score:   Fall Risk Score:  `1  Depression screen PHQ 2/9  Depression screen [facility] 2/9 08/23/2018 06/22/2018 02/28/2018 11/30/2017 07/29/2017 02/16/2017 10/21/2016  Decreased Interest 0 0 0 0 0 0 0  Down, Depressed, Hopeless 0 0 0 0 0 0 0  PHQ - 2 Score 0 0 0 0 0 0 0  Altered sleeping - - - - - - -  Tired, decreased energy - - - - - - -  Change in appetite  - - - - - - -  Feeling bad or failure about yourself  - - - - - - -  Trouble concentrating - - - - - - -  Moving slowly or fidgety/restless - - - - - - -  Suicidal thoughts - - - - - - -  PHQ-9 Score - - - - - - -    Review of Systems  Constitutional: Positive for unexpected weight change.  HENT: Negative.   Eyes: Negative.   Respiratory: Negative.   Cardiovascular: Negative.   Gastrointestinal: Positive for constipation.  Endocrine: Negative.        High blood sugar  Genitourinary: Negative.   Musculoskeletal: Positive for arthralgias, back pain, neck pain and neck stiffness.       Spasms   Neurological: Negative.   Hematological: Negative.   Psychiatric/Behavioral: Negative.   All other systems reviewed and are negative.      Objective:   Physical Exam Vitals signs and nursing note reviewed.  Constitutional:      Appearance: Normal appearance.  Neck:     Musculoskeletal: Normal range of motion and neck supple.  Cardiovascular:     Rate and Rhythm: Normal rate and regular rhythm.     Pulses: Normal pulses.     Heart sounds: Normal heart sounds.  Pulmonary:     Effort: Pulmonary effort is normal.     Breath sounds: Normal breath sounds.  Musculoskeletal:     Comments: Normal Muscle Bulk and Muscle Testing Reveals:  Upper Extremities: Full ROM and Muscle Strength 5/5   Lumbar Paraspinal Tenderness: L-4-L-5 Lower Extremities: Full ROM and Muscle Strength 5/5 Arises from chair with ease Narrow Based Gait   Skin:    General: Skin is warm and dry.  Neurological:     Mental Status: He is alert and oriented to person, place, and time.  Psychiatric:        Mood and Affect: Mood normal.        Behavior: Behavior normal.       Assessment and Plan  1. Chronic left inguinal pain: S/P inguinal hernia (indirect) and mesh repair. Continue to Monitor.09/23//2020. Refilled: Hydrocodone 10/325 mg  one tablet daily as needed, may take and extra tablet when pain is moderate  to sever 15 days out of the month.# 45.Second script sent for the following month. We will continue the opioid monitoring program, this consists of regular clinic visits, examinations, urine drug screen, pill counts as well as use of [place] Controlled Substance Reporting system. 2. Lumbosacral Spondylosis: Continue with Exercise regime. Continue to Monitor.12/14/2018 3. Muscle Spasm:No complaints today.Continuecurrent medication regimen with Robaxin. Continue to monitor.12/14/2018 4.Chronic Bilateral Shoulder Pain/Chronic Pain Syndrome: No complaints today, Continue HEP as tolerated.  12/14/2018.  of face to face patient care time was spent during this visit. All questions were encouraged and answered.  F/U in 2 months

## 2018-12-23 ENCOUNTER — Encounter: Payer: Self-pay | Admitting: Cardiovascular Disease

## 2018-12-23 ENCOUNTER — Other Ambulatory Visit: Payer: Self-pay

## 2018-12-23 ENCOUNTER — Ambulatory Visit (INDEPENDENT_AMBULATORY_CARE_PROVIDER_SITE_OTHER): Payer: Medicare Other | Admitting: Cardiovascular Disease

## 2018-12-23 DIAGNOSIS — R079 Chest pain, unspecified: Secondary | ICD-10-CM | POA: Diagnosis not present

## 2018-12-23 DIAGNOSIS — E782 Mixed hyperlipidemia: Secondary | ICD-10-CM

## 2018-12-23 DIAGNOSIS — I10 Essential (primary) hypertension: Secondary | ICD-10-CM | POA: Diagnosis not present

## 2018-12-23 DIAGNOSIS — F17200 Nicotine dependence, unspecified, uncomplicated: Secondary | ICD-10-CM | POA: Diagnosis not present

## 2018-12-23 NOTE — Progress Notes (Signed)
12/23/2018 HALIL RENTZ   Nov 15, 1955  [id number]  Primary Physician Nche, Charlene Brooke, NP Primary Cardiologist: Lorretta Harp MD FACP, [place], [place], [place]  HPI:  Shaun Lang is a 63 y.o. moderately overweight divorced African American male father of 44, grandfather of 5 grandchildren who I last saw  10/22/2015. He was referred for chest pain. He had a negative Myoview. His pain has essentially resolved. Risk factors include continued tobacco abuse of 1 pack per day although he says he is trying to quit, hypertension, hyperlipidemia. I did start him on atorvastatin. He is resistant to risk factor modification.   Since I saw him in the office over 3 years ago he continues to do well.  His blood pressure is elevated because he did not take his blood pressure medicines this morning.  He is still continues to smoke 10 cigarettes a day.  He denies chest pain or shortness of breath.  Current Meds  Medication Sig   atorvastatin (LIPITOR) 40 MG tablet Take 1 tablet (40 mg total) by mouth daily at 6 PM.   cyclobenzaprine (FLEXERIL) 10 MG tablet TAKE 1 TABLET BY MOUTH TWICE DAILY AS NEEDED FOR MUSCLE SPASM   diclofenac (VOLTAREN) 75 MG EC tablet Take 1 tablet (75 mg total) by mouth 2 (two) times daily with a meal.   gabapentin (NEURONTIN) 300 MG capsule Take 300 mg by mouth at bedtime as needed (pain). Bid to tid   HYDROcodone-acetaminophen (NORCO) 10-325 MG tablet Take 1 tablet by mouth daily as needed (pain). May Take an extra tablet when pain is moderate to sever, 15 days out of the month   lisinopril (ZESTRIL) 5 MG tablet Take 1 tablet (5 mg total) by mouth daily.   methocarbamol (ROBAXIN) 500 MG tablet Take 1 tablet (500 mg total) by mouth 4 (four) times daily.   Multiple Vitamin (MULTIVITAMIN WITH MINERALS) TABS tablet Take 1 tablet by mouth daily.   triamterene-hydrochlorothiazide (MAXZIDE-25) 37.5-25 MG tablet Take 1 tablet by mouth daily.   [DISCONTINUED] hydrocortisone cream  1 % Apply 1 application topically 3 (three) times daily as needed for itching.   [DISCONTINUED] nicotine (NICODERM CQ - DOSED IN MG/24 HOURS) 21 mg/24hr patch Place 21 mg onto the skin daily.     No Known Allergies  Social History   Socioeconomic History   Marital status: Single    Spouse name: Not on file   Number of children: Not on file   Years of education: Not on file   Highest education level: Not on file  Occupational History    Comment: unemployed  Social Needs   Financial resource strain: Not on file   Food insecurity    Worry: Not on file    Inability: Not on file   Transportation needs    Medical: Not on file    Non-medical: Not on file  Tobacco Use   Smoking status: Current Every Day Smoker    Packs/day: 1.00    Types: Cigarettes   Smokeless tobacco: Never Used  Substance and Sexual Activity   Alcohol use: Yes    Alcohol/week: 3.0 standard drinks    Types: 3 Shots of liquor per week   Drug use: No   Sexual activity: Yes  Lifestyle   Physical activity    Days per week: Not on file    Minutes per session: Not on file   Stress: Not on file  Relationships   Social connections    Talks on phone: Not  on file    Gets together: Not on file    Attends religious service: Not on file    Active member of club or organization: Not on file    Attends meetings of clubs or organizations: Not on file    Relationship status: Not on file   Intimate partner violence    Fear of current or ex partner: Not on file    Emotionally abused: Not on file    Physically abused: Not on file    Forced sexual activity: Not on file  Other Topics Concern   Not on file  Social History Narrative   Not on file     Review of Systems: General: negative for chills, fever, night sweats or weight changes.  Cardiovascular: negative for chest pain, dyspnea on exertion, edema, orthopnea, palpitations, paroxysmal nocturnal dyspnea or shortness of breath Dermatological:  negative for rash Respiratory: negative for cough or wheezing Urologic: negative for hematuria Abdominal: negative for nausea, vomiting, diarrhea, bright red blood per rectum, melena, or hematemesis Neurologic: negative for visual changes, syncope, or dizziness All other systems reviewed and are otherwise negative except as noted above.    Blood pressure (!) 157/91, pulse 63, temperature 98.2 F (36.8 C), height 5\' 8"  (1.727 m), weight 192 lb 9.6 oz (87.4 kg), SpO2 98 %.  General appearance: alert and no distress Neck: no adenopathy, no carotid bruit, no JVD, supple, symmetrical, trachea midline and thyroid not enlarged, symmetric, no tenderness/mass/nodules Lungs: clear to auscultation bilaterally Heart: regular rate and rhythm, S1, S2 normal, no murmur, click, rub or gallop Extremities: extremities normal, atraumatic, no cyanosis or edema Pulses: 2+ and symmetric Skin: Skin color, texture, turgor normal. No rashes or lesions Neurologic: Alert and oriented X 3, normal strength and tone. Normal symmetric reflexes. Normal coordination and gait  EKG sinus rhythm at 60 with evidence of LVH with repolarization changes unchanged from prior EKGs.  I personally reviewed this EKG  ASSESSMENT AND PLAN:   Chest pain with low risk for cardiac etiology History of atypical chest pain in the past with low risk Myoview September 2013.  He denies chest pain currently  Hypertension History of essential hypertension with blood pressure measured today at 157/91.  He is on lisinopril and Maxide.  He did not take his blood pressure medicines this morning.  Hyperlipidemia History of hyperlipidemia on statin therapy with lipid profile performed 01/06/2018 revealing total cholesterol 177, LDL of 90 and HDL 47.  Smoker History of ongoing tobacco abuse currently smoking 10 cigarettes a day recalcitrant to respect modification.      01/08/2018 MD FACP,FACC,FAHA, [facility] 12/23/2018 9:53 AM

## 2018-12-23 NOTE — Assessment & Plan Note (Signed)
History of ongoing tobacco abuse currently smoking 10 cigarettes a day recalcitrant to respect modification.

## 2018-12-23 NOTE — Assessment & Plan Note (Signed)
History of hyperlipidemia on statin therapy with lipid profile performed 01/06/2018 revealing total cholesterol 177, LDL of 90 and HDL 47.

## 2018-12-23 NOTE — Assessment & Plan Note (Signed)
History of atypical chest pain in the past with low risk Myoview September 2013.  He denies chest pain currently

## 2018-12-23 NOTE — Patient Instructions (Signed)

## 2018-12-23 NOTE — Assessment & Plan Note (Signed)
History of essential hypertension with blood pressure measured today at 157/91.  He is on lisinopril and Maxide.  He did not take his blood pressure medicines this morning.

## 2019-01-24 ENCOUNTER — Telehealth: Payer: Self-pay | Admitting: Nurse Practitioner

## 2019-01-24 NOTE — Telephone Encounter (Signed)

## 2019-01-25 ENCOUNTER — Other Ambulatory Visit: Payer: Self-pay

## 2019-01-25 ENCOUNTER — Encounter: Payer: Self-pay | Admitting: Nurse Practitioner

## 2019-01-25 ENCOUNTER — Ambulatory Visit: Payer: Self-pay | Admitting: Nurse Practitioner

## 2019-01-25 ENCOUNTER — Ambulatory Visit: Payer: Medicare Other | Admitting: *Deleted

## 2019-01-25 ENCOUNTER — Ambulatory Visit (INDEPENDENT_AMBULATORY_CARE_PROVIDER_SITE_OTHER): Payer: Medicare Other | Admitting: Nurse Practitioner

## 2019-01-25 VITALS — BP 142/84 | HR 62 | Temp 97.7°F | Ht 68.0 in | Wt 190.8 lb

## 2019-01-25 DIAGNOSIS — M25552 Pain in left hip: Secondary | ICD-10-CM | POA: Diagnosis not present

## 2019-01-25 DIAGNOSIS — I10 Essential (primary) hypertension: Secondary | ICD-10-CM

## 2019-01-25 DIAGNOSIS — R1032 Left lower quadrant pain: Secondary | ICD-10-CM

## 2019-01-25 DIAGNOSIS — G8929 Other chronic pain: Secondary | ICD-10-CM

## 2019-01-25 MED ORDER — TRIAMTERENE-HCTZ 37.5-25 MG PO TABS: 1.0000 | ORAL_TABLET | Freq: Every day | ORAL | 1 refills | Status: DC

## 2019-01-25 NOTE — Patient Instructions (Signed)
You will be contacted to schedule appt with ortho and for pelvic US. Continue current medications. You may use cold compress 2-3x/day as needed. (4mins at a time)  Take BP medications as prescribed.

## 2019-01-25 NOTE — Progress Notes (Signed)
Subjective:  Patient ID: Shaun Lang, male    DOB: December 04, 1955  Age: 63 y.o. MRN: [id number]  CC: Pain (pt is c/o left hip and groin painful/comes and goes--got worse over the weekend)  Groin Pain The patient's primary symptoms include pelvic pain. The patient's pertinent negatives include no genital injury, genital itching, genital lesions, penile discharge, penile pain, priapism, scrotal swelling or testicular pain. This is a recurrent problem. The current episode started more than 1 year ago (2015). The problem occurs intermittently. The problem has been waxing and waning. The pain is severe. Associated symptoms include hesitancy and joint pain. Pertinent negatives include no abdominal pain, anorexia, chills, diarrhea, dysuria, fever, flank pain, frequency, joint swelling, rash, urgency or urinary retention. His past medical history is significant for BPH and an inguinal hernia. There is no history of chlamydia, gonorrhea, HIV, kidney stones, prostatitis, syphilis or varicocele.  s/p left inguinal hernia repair 2015 Left Hip x-ray done 2015: degenerative changes Completed PT in 2015. He does not remember if this was helpful or not. Current use of hydrocodone/acetaminophen, diclofenac, muscle relaxant, and gabapentin with moderate relief. He denies any recent fall.  Reviewed past Medical, Social and Family history today.  Outpatient Medications Prior to Visit  Medication Sig Dispense Refill  . atorvastatin (LIPITOR) 40 MG tablet Take 1 tablet (40 mg total) by mouth daily at 6 PM. 90 tablet 3  . cyclobenzaprine (FLEXERIL) 10 MG tablet TAKE 1 TABLET BY MOUTH TWICE DAILY AS NEEDED FOR MUSCLE SPASM 60 tablet 2  . diclofenac (VOLTAREN) 75 MG EC tablet Take 1 tablet (75 mg total) by mouth 2 (two) times daily with a meal. 60 tablet 3  . gabapentin (NEURONTIN) 300 MG capsule Take 300 mg by mouth at bedtime as needed (pain). Bid to tid    . HYDROcodone-acetaminophen (NORCO) 10-325 MG tablet Take 1  tablet by mouth daily as needed (pain). May Take an extra tablet when pain is moderate to sever, 15 days out of the month 45 tablet 0  . lisinopril (ZESTRIL) 5 MG tablet Take 1 tablet (5 mg total) by mouth daily. 90 tablet 3  . methocarbamol (ROBAXIN) 500 MG tablet Take 1 tablet (500 mg total) by mouth 4 (four) times daily. 120 tablet 3  . triamterene-hydrochlorothiazide (MAXZIDE-25) 37.5-25 MG tablet Take 1 tablet by mouth daily. 90 tablet 1  . Multiple Vitamin (MULTIVITAMIN WITH MINERALS) TABS tablet Take 1 tablet by mouth daily.     No facility-administered medications prior to visit.     ROS See HPI  Objective:  BP (!) 142/84   Pulse 62   Temp 97.7 F (36.5 C) (Tympanic)   Ht 5\' 8"  (1.727 m)   Wt 190 lb 12.8 oz (86.5 kg)   SpO2 98%   BMI 29.01 kg/m   BP Readings from Last 3 Encounters:  01/25/19 (!) 142/84  12/23/18 (!) 157/91  12/14/18 136/89    Wt Readings from Last 3 Encounters:  01/25/19 190 lb 12.8 oz (86.5 kg)  12/23/18 192 lb 9.6 oz (87.4 kg)  12/14/18 192 lb (87.1 kg)    Physical Exam Exam conducted with a chaperone present.  Cardiovascular:     Rate and Rhythm: Normal rate.     Pulses: Normal pulses.  Pulmonary:     Effort: Pulmonary effort is normal.  Musculoskeletal: Normal range of motion.        General: Tenderness present. No swelling, deformity or signs of injury.     Right hip: Normal.  Left hip: He exhibits tenderness. He exhibits normal range of motion, normal strength and no bony tenderness.     Right knee: Normal.     Left knee: Normal.     Right upper leg: Normal.     Left upper leg: Normal.     Right lower leg: No edema.     Left lower leg: No edema.       Legs:  Skin:    Findings: No erythema or rash.  Neurological:     Mental Status: He is alert and oriented to person, place, and time.    Lab Results  Component Value Date   WBC 3.0 (L) 07/15/2017   HGB 13.2 07/15/2017   HCT 40.0 07/15/2017   PLT 156.0 07/15/2017    GLUCOSE 97 10/07/2018   CHOL 200 10/07/2018   TRIG 79.0 10/07/2018   HDL 59.80 10/07/2018   LDLCALC 125 (H) 10/07/2018   ALT 18 10/07/2018   AST 16 10/07/2018   NA 138 10/07/2018   K 4.0 10/07/2018   CL 105 10/07/2018   CREATININE 1.07 10/07/2018   BUN 12 10/07/2018   CO2 28 10/07/2018   TSH 0.64 10/30/2016   PSA 0.54 01/06/2018   HGBA1C 5.6 12/19/2015    Dg Lumbar Spine Complete  Result Date: 12/08/2017 CLINICAL DATA:  63 year old male with 3 weeks of progressed chronic back pain after working in yd, twisting injury. EXAM: LUMBAR SPINE - COMPLETE 4+ VIEW COMPARISON:  Lumbar radiographs 07/07/2016 and earlier. FINDINGS: Normal lumbar segmentation. Improved lordosis compared to 2018. Disc spaces are stable and relatively preserved. There is degenerative anterior endplate spurring, most pronounced at L1-L2 and L3-L4. No pars fracture. Mild bilateral L5-S1 facet hypertrophy. Visible lower thoracic levels appear intact. Sacral ala and SI joints appear normal. Negative abdominal visceral contours. Incidental pelvic phleboliths. IMPRESSION: 1.  No acute osseous abnormality identified in the lumbar spine. 2. Stable and relatively preserved disc spaces with lumbar endplate and facet hypertrophy. Electronically Signed   By: Odessa FlemingH  Hall M.D.   On: 12/08/2017 15:23    Assessment & Plan:   [name] was seen today for pain.  Diagnoses and all orders for this visit:  Chronic left hip pain -     AMB referral to orthopedics  Essential hypertension -     triamterene-hydrochlorothiazide (MAXZIDE-25) 37.5-25 MG tablet; Take 1 tablet by mouth daily.  Left groin pain -     AMB referral to orthopedics -     US Pelvis Limited; Future   I am having Krishan L. Scantlin maintain his gabapentin, multivitamin with minerals, diclofenac, methocarbamol, cyclobenzaprine, atorvastatin, lisinopril, HYDROcodone-acetaminophen, and triamterene-hydrochlorothiazide.  Meds ordered this encounter  Medications  .  triamterene-hydrochlorothiazide (MAXZIDE-25) 37.5-25 MG tablet    Sig: Take 1 tablet by mouth daily.    Dispense:  90 tablet    Refill:  1    Order Specific Question:   Supervising Provider    Answer:   Dianne DunARON, TALIA M [[id number]]    Problem List Items Addressed This Visit      Cardiovascular and Mediastinum   Hypertension   Relevant Medications   triamterene-hydrochlorothiazide (MAXZIDE-25) 37.5-25 MG tablet    Other Visit Diagnoses    Chronic left hip pain    -  Primary   Relevant Orders   AMB referral to orthopedics   Left groin pain       Relevant Orders   AMB referral to orthopedics   US Pelvis Limited      Follow-up: Return in  about 6 months (around 07/25/2019) for HTN and , hyperlipidemia (fasting).  Alysia Penna, NP

## 2019-02-01 ENCOUNTER — Ambulatory Visit
Admission: RE | Admit: 2019-02-01 | Discharge: 2019-02-01 | Disposition: A | Discharge status: Home / Self Care | Payer: Medicare Other | Source: Ambulatory Visit | Attending: Nurse Practitioner | Admitting: Nurse Practitioner

## 2019-02-01 DIAGNOSIS — R1032 Left lower quadrant pain: Secondary | ICD-10-CM

## 2019-02-02 ENCOUNTER — Other Ambulatory Visit: Payer: Self-pay

## 2019-02-02 ENCOUNTER — Ambulatory Visit: Payer: Self-pay

## 2019-02-02 ENCOUNTER — Encounter: Payer: Self-pay | Admitting: Orthopaedic Surgery

## 2019-02-02 ENCOUNTER — Ambulatory Visit (INDEPENDENT_AMBULATORY_CARE_PROVIDER_SITE_OTHER): Payer: Medicare Other | Admitting: Orthopaedic Surgery

## 2019-02-02 VITALS — Ht 68.0 in | Wt 190.0 lb

## 2019-02-02 DIAGNOSIS — M25552 Pain in left hip: Secondary | ICD-10-CM | POA: Diagnosis not present

## 2019-02-02 NOTE — Progress Notes (Signed)
Office Visit Note   Patient: Shaun Lang           Date of Birth: 06-Nov-1955           MRN: [id number] Visit Date: 02/02/2019              Requested by: Anne Ng, NP [address] [place],  [place] [address] PCP: Anne Ng, NP   Assessment & Plan: Visit Diagnoses:  1. Pain in left hip     Plan: Impression is left hip and groin pain due to mild to moderate DJD.   Based on our discussion today patient would like to move forward with an intra-articular cortisone injection.  Patient will schedule to come back for injection with Dr. Prince Rome.  Questions encouraged and answered.  He can follow-up with [place] as needed.  Follow-Up Instructions: Return if symptoms worsen or fail to improve.   Orders:  Orders Placed This Encounter  Procedures  . XR HIP UNILAT W OR W/O PELVIS 2-3 VIEWS LEFT  . US Guided Needle Placement - No Linked Charges   No orders of the defined types were placed in this encounter.     Procedures: No procedures performed   Clinical Data: No additional findings.   Subjective: Chief Complaint  Patient presents with  . Left Hip - Pain    Shaun Lang is 63 year old gentleman comes in for evaluation of chronic left hip and groin pain for the last 7 years.  Denies any injuries.  He does have chronic neck and back pain for which he takes hydrocodone and gabapentin.  He does go to a chronic pain clinic.  He is on disability currently.  He denies any radicular symptoms.  Denies any constitutional symptoms.  He states that he has increased pain and stiffness when he transitions from sit to stand.   Review of Systems  Constitutional: Negative.   All other systems reviewed and are negative.    Objective: Vital Signs: Ht 5\' 8"  (1.727 m)   Wt 190 lb (86.2 kg)   BMI 28.89 kg/m   Physical Exam Vitals signs and nursing note reviewed.  Constitutional:      Appearance: He is well-developed.  HENT:     Head: Normocephalic and atraumatic.   Eyes:     Pupils: Pupils are equal, round, and reactive to light.  Neck:     Musculoskeletal: Neck supple.  Pulmonary:     Effort: Pulmonary effort is normal.  Abdominal:     Palpations: Abdomen is soft.  Musculoskeletal: Normal range of motion.  Skin:    General: Skin is warm.  Neurological:     Mental Status: He is alert and oriented to person, place, and time.  Psychiatric:        Behavior: Behavior normal.        Thought Content: Thought content normal.        Judgment: Judgment normal.     Ortho Exam Left hip exam shows good range of motion with mildly positive FADIR.  Positive Stinchfield.  Negative logroll.  Negative sciatic tension sign.  Lateral hip is nontender  Specialty Comments:  No specialty comments available.  Imaging: Pelvis Limited  Result Date: 02/01/2019 CLINICAL DATA:  Left groin pain for 1 year.  Intermittent. EXAM: 13/01/2019 PELVIS LIMITED TECHNIQUE: Ultrasound examination of the pelvic soft tissues was performed in the area of clinical concern. COMPARISON:  None. FINDINGS: Focused ultrasound exam was performed in the left groin region, the area of  concern identified by the patient. By report, the patient has history of left inguinal hernia repair in 2015. There are some small lymph nodes in the groin region. No obvious recurrent left inguinal hernia hernia. IMPRESSION: No sonographic findings to explain the patient's history of pain. CT without contrast would be a more sensitive means to investigate for recurrent hernia, as clinically warranted. Electronically Signed   By: Misty Stanley M.D.   On: 02/01/2019 15:01   US Guided Needle Placement - No Linked Charges  Result Date: 02/02/2019 Please see Notes tab for imaging impression.  Xr Hip Unilat W Or W/o Pelvis 2-3 Views Left  Result Date: 02/02/2019 Mild to moderate left hip DJD    PMFS History: Patient Active Problem List   Diagnosis Date Noted  . Chronic pain syndrome 09/29/2017  . Lumbosacral  spondylosis without myelopathy 09/29/2017  . Obstructive sleep apnea treated with continuous positive airway pressure (CPAP) 06/21/2017  . Inadequate sleep hygiene 02/17/2017  . Snoring 02/17/2017  . Excessive daytime sleepiness 02/17/2017  . Patient takes muscle relaxants 02/17/2017  . Myofascial pain 02/16/2017  . Insomnia 10/14/2016  . Rotator cuff syndrome of left shoulder 01/30/2015  . Arthritis of left acromioclavicular joint 01/30/2015  . Knee pain, bilateral 06/05/2014  . Right rotator cuff tendinitis 07/07/2013  . Chest pain with low risk for cardiac etiology   . Hypertension   . Hyperlipidemia   . DJD (degenerative joint disease)   . Smoker   . Ilioinguinal neuralgia 06/01/2012  . Left inguinal pain 12/23/2011  . Bell's palsy 06/21/2008   Past Medical History:  Diagnosis Date  . Bell's palsy April 2010  . Chest pain with low risk for cardiac etiology    Low Risk Myoview Sept 2013  . DJD (degenerative joint disease)    disabilty secondary to neck issues  . Hyperlipidemia   . Hypertension   . Sleep apnea    does not use CPAP  . Smoker     Family History  Problem Relation Age of Onset  . Diabetes Mother   . Cancer Sister        breast  . Diabetes Sister   . Stroke Sister 83  . Breast cancer Sister   . Colon cancer Neg Hx   . Esophageal cancer Neg Hx   . Rectal cancer Neg Hx   . Stomach cancer Neg Hx     Past Surgical History:  Procedure Laterality Date  . BUNIONECTOMY Left 07/15/2017   with big toe fracture repair  . COLONOSCOPY  07/18/2012   polyps-Dr.Outlaw  . HERNIA REPAIR  04/1998   LIH  . NECK SURGERY  07/21/02 and 01/2006  . WRIST SURGERY  1992   right   Social History   Occupational History    Comment: unemployed  Tobacco Use  . Smoking status: Current Every Day Smoker    Packs/day: 1.00    Types: Cigarettes  . Smokeless tobacco: Never Used  Substance and Sexual Activity  . Alcohol use: Yes    Alcohol/week: 3.0 standard drinks     Types: 3 Shots of liquor per week  . Drug use: No  . Sexual activity: Yes

## 2019-02-07 ENCOUNTER — Ambulatory Visit: Payer: Medicare Other | Admitting: Family Medicine

## 2019-02-15 ENCOUNTER — Encounter: Payer: Self-pay | Admitting: Registered Nurse

## 2019-02-15 ENCOUNTER — Other Ambulatory Visit: Payer: Self-pay

## 2019-02-15 ENCOUNTER — Encounter: Payer: Medicare Other | Attending: Physical Medicine & Rehabilitation | Admitting: Registered Nurse

## 2019-02-15 VITALS — BP 133/78 | HR 73 | Temp 97.5°F | Ht 68.0 in | Wt 190.0 lb

## 2019-02-15 DIAGNOSIS — R1032 Left lower quadrant pain: Secondary | ICD-10-CM | POA: Diagnosis present

## 2019-02-15 DIAGNOSIS — M150 Primary generalized (osteo)arthritis: Secondary | ICD-10-CM | POA: Insufficient documentation

## 2019-02-15 DIAGNOSIS — M7581 Other shoulder lesions, right shoulder: Secondary | ICD-10-CM | POA: Diagnosis present

## 2019-02-15 DIAGNOSIS — Z5181 Encounter for therapeutic drug level monitoring: Secondary | ICD-10-CM

## 2019-02-15 DIAGNOSIS — G5790 Unspecified mononeuropathy of unspecified lower limb: Secondary | ICD-10-CM | POA: Diagnosis present

## 2019-02-15 DIAGNOSIS — R1030 Lower abdominal pain, unspecified: Secondary | ICD-10-CM | POA: Insufficient documentation

## 2019-02-15 DIAGNOSIS — Z79891 Long term (current) use of opiate analgesic: Secondary | ICD-10-CM | POA: Diagnosis present

## 2019-02-15 DIAGNOSIS — G894 Chronic pain syndrome: Secondary | ICD-10-CM

## 2019-02-15 DIAGNOSIS — M542 Cervicalgia: Secondary | ICD-10-CM

## 2019-02-15 DIAGNOSIS — M7918 Myalgia, other site: Secondary | ICD-10-CM

## 2019-02-15 DIAGNOSIS — M47817 Spondylosis without myelopathy or radiculopathy, lumbosacral region: Secondary | ICD-10-CM

## 2019-02-15 MED ORDER — HYDROCODONE-ACETAMINOPHEN 10-325 MG PO TABS: 1.0000 | ORAL_TABLET | Freq: Every day | ORAL | 0 refills | Status: DC | PRN

## 2019-02-15 MED ORDER — GABAPENTIN 300 MG PO CAPS: 300.0000 mg | ORAL_CAPSULE | Freq: Two times a day (BID) | ORAL | 2 refills | Status: DC

## 2019-02-15 MED ORDER — CYCLOBENZAPRINE HCL 10 MG PO TABS: ORAL_TABLET | ORAL | 2 refills | Status: DC

## 2019-02-15 NOTE — Progress Notes (Signed)
Subjective:    Patient ID: Shaun Lang, male    DOB: [birth year], 63 y.o.   MRN: [id number]  HPI: Shaun Lang is a 63 y.o. male who returns for follow up appointment for chronic pain and medication refill. He states his pain is located in his neck, lower back and left groin. He rates his pain 9. His current exercise regime is walking and performing stretching exercises.  [name] Morphine equivalent is 15.00 MME.  UDS Ordered today.    Pain Inventory Average Pain 9 Pain Right Now 9 My pain is sharp, burning and aching  In the last 24 hours, has pain interfered with the following? General activity 9 Relation with others 9 Enjoyment of life 0 What TIME of day is your pain at its worst? morning, evening  Sleep (in general) Poor  Pain is worse with: walking, bending, standing and some activites Pain improves with: medication Relief from Meds: 7  Mobility walk with assistance use a cane how many minutes can you walk?  5 ability to climb steps?  yes do you drive?  no Do you have any goals in this area?  yes  Function disabled: date disabled .  Neuro/Psych spasms dizziness  Prior Studies Any changes since last visit?  no  Physicians involved in your care Any changes since last visit?  no   Family History  Problem Relation Age of Onset  . Diabetes Mother   . Cancer Sister        breast  . Diabetes Sister   . Stroke Sister 1740  . Breast cancer Sister   . Colon cancer Neg Hx   . Esophageal cancer Neg Hx   . Rectal cancer Neg Hx   . Stomach cancer Neg Hx    Social History   Socioeconomic History  . Marital status: Single    Spouse name: Not on file  . Number of children: Not on file  . Years of education: Not on file  . Highest education level: Not on file  Occupational History    Comment: unemployed  Social Needs  . Financial resource strain: Not on file  . Food insecurity    Worry: Not on file    Inability: Not on file  . Transportation needs     Medical: Not on file    Non-medical: Not on file  Tobacco Use  . Smoking status: Current Every Day Smoker    Packs/day: 1.00    Types: Cigarettes  . Smokeless tobacco: Never Used  Substance and Sexual Activity  . Alcohol use: Yes    Alcohol/week: 3.0 standard drinks    Types: 3 Shots of liquor per week  . Drug use: No  . Sexual activity: Yes  Lifestyle  . Physical activity    Days per week: Not on file    Minutes per session: Not on file  . Stress: Not on file  Relationships  . Social Musicianconnections    Talks on phone: Not on file    Gets together: Not on file    Attends religious service: Not on file    Active member of club or organization: Not on file    Attends meetings of clubs or organizations: Not on file    Relationship status: Not on file  Other Topics Concern  . Not on file  Social History Narrative  . Not on file   Past Surgical History:  Procedure Laterality Date  . BUNIONECTOMY Left 07/15/2017   with big toe  fracture repair  . COLONOSCOPY  07/18/2012   polyps-Dr.Outlaw  . HERNIA REPAIR  04/1998   LIH  . NECK SURGERY  07/21/02 and 01/2006  . WRIST SURGERY  1992   right   Past Medical History:  Diagnosis Date  . Bell's palsy April 2010  . Chest pain with low risk for cardiac etiology    Low Risk Myoview Sept 2013  . DJD (degenerative joint disease)    disabilty secondary to neck issues  . Hyperlipidemia   . Hypertension   . Sleep apnea    does not use CPAP  . Smoker    Temperature (Abnormal) 97.5 F (36.4 C)   Height 5\' 8"  (1.727 m)   Weight 190 lb (86.2 kg)   Body Mass Index 28.89 kg/m   Opioid Risk Score:   Fall Risk Score:  `1  Depression screen PHQ 2/9  Depression screen [facility] 2/9 08/23/2018 06/22/2018 02/28/2018 11/30/2017 07/29/2017 02/16/2017 10/21/2016  Decreased Interest 0 0 0 0 0 0 0  Down, Depressed, Hopeless 0 0 0 0 0 0 0  PHQ - 2 Score 0 0 0 0 0 0 0  Altered sleeping - - - - - - -  Tired, decreased energy - - - - - - -  Change in  appetite - - - - - - -  Feeling bad or failure about yourself  - - - - - - -  Trouble concentrating - - - - - - -  Moving slowly or fidgety/restless - - - - - - -  Suicidal thoughts - - - - - - -  PHQ-9 Score - - - - - - -    Review of Systems  Constitutional: Positive for appetite change and diaphoresis.  HENT: Negative.   Eyes: Negative.   Respiratory: Negative.   Cardiovascular: Negative.   Gastrointestinal: Negative.   Endocrine: Negative.   Genitourinary: Negative.   Musculoskeletal: Positive for arthralgias, back pain and neck pain.       Spasms   Skin: Negative.   Allergic/Immunologic: Negative.   Neurological: Positive for dizziness.  Hematological: Negative.   Psychiatric/Behavioral: Negative.        Objective:   Physical Exam Vitals signs and nursing note reviewed.  Constitutional:      Appearance: Normal appearance.  Neck:     Musculoskeletal: Normal range of motion and neck supple.     Comments: Cervical Paraspinal Tenderness: C-5-C-6 Cardiovascular:     Rate and Rhythm: Normal rate and regular rhythm.     Pulses: Normal pulses.     Heart sounds: Normal heart sounds.  Pulmonary:     Effort: Pulmonary effort is normal.     Breath sounds: Normal breath sounds.  Musculoskeletal:     Comments: Normal Muscle Bulk and Muscle Testing Reveals:  Upper Extremities: Full ROM and Muscle Strength 5/5  Lumbar Paraspinal Tenderness: L-3-L-5 Lower Extremities: Full ROM and Muscle Strength 5/5 Arises from Table with ease Narrow Based Gait   Skin:    General: Skin is warm and dry.  Neurological:     Mental Status: He is alert and oriented to person, place, and time.  Psychiatric:        Mood and Affect: Mood normal.        Behavior: Behavior normal.           Assessment & Plan:  1. Chronic left inguinal pain: S/P inguinal hernia (indirect) and mesh repair. Continue to Monitor.11/25//2020. Refilled: Hydrocodone 10/325 mg one tablet daily  as needed, may take  and extra tablet when pain is moderate to sever 15 days out of the month.# 45.Second script sent for the following month. We will continue the opioid monitoring program, this consists of regular clinic visits, examinations, urine drug screen, pill counts as well as use of [place] Controlled Substance Reporting system. 2. Lumbosacral Spondylosis: Continue with Exercise regime. Continue to Monitor.02/15/2019 3. Muscle Spasm:Continuecurrent medication regimen with Flexeril. Continue to monitor.02/15/2019 4.Chronic Bilateral Shoulder Pain/Chronic Pain Syndrome: No complaints today, Continue HEP as tolerated.  02/15/2019.  of face to face patient care time was spent during this visit. All questions were encouraged and answered.  F/U in 2 months

## 2019-02-20 ENCOUNTER — Ambulatory Visit: Payer: Medicare Other | Admitting: Family Medicine

## 2019-02-22 LAB — TOXASSURE SELECT,+ANTIDEPR,UR

## 2019-02-28 ENCOUNTER — Telehealth: Payer: Self-pay | Admitting: *Deleted

## 2019-02-28 NOTE — Telephone Encounter (Signed)
Urine drug screen for this encounter is consistent for prescribed medication 

## 2019-04-12 ENCOUNTER — Encounter: Payer: Medicare Other | Attending: Physical Medicine & Rehabilitation | Admitting: Registered Nurse

## 2019-04-12 ENCOUNTER — Other Ambulatory Visit: Payer: Self-pay

## 2019-04-12 ENCOUNTER — Encounter: Payer: Self-pay | Admitting: Registered Nurse

## 2019-04-12 VITALS — BP 147/88 | HR 71 | Temp 97.5°F | Ht 68.0 in | Wt 192.0 lb

## 2019-04-12 DIAGNOSIS — M7581 Other shoulder lesions, right shoulder: Secondary | ICD-10-CM | POA: Diagnosis present

## 2019-04-12 DIAGNOSIS — M25511 Pain in right shoulder: Secondary | ICD-10-CM | POA: Diagnosis not present

## 2019-04-12 DIAGNOSIS — G5790 Unspecified mononeuropathy of unspecified lower limb: Secondary | ICD-10-CM | POA: Diagnosis present

## 2019-04-12 DIAGNOSIS — M542 Cervicalgia: Secondary | ICD-10-CM | POA: Diagnosis not present

## 2019-04-12 DIAGNOSIS — M7918 Myalgia, other site: Secondary | ICD-10-CM

## 2019-04-12 DIAGNOSIS — Z79891 Long term (current) use of opiate analgesic: Secondary | ICD-10-CM | POA: Diagnosis present

## 2019-04-12 DIAGNOSIS — R1032 Left lower quadrant pain: Secondary | ICD-10-CM | POA: Insufficient documentation

## 2019-04-12 DIAGNOSIS — M47817 Spondylosis without myelopathy or radiculopathy, lumbosacral region: Secondary | ICD-10-CM

## 2019-04-12 DIAGNOSIS — Z5181 Encounter for therapeutic drug level monitoring: Secondary | ICD-10-CM | POA: Diagnosis present

## 2019-04-12 DIAGNOSIS — M150 Primary generalized (osteo)arthritis: Secondary | ICD-10-CM | POA: Diagnosis present

## 2019-04-12 DIAGNOSIS — R1030 Lower abdominal pain, unspecified: Secondary | ICD-10-CM | POA: Diagnosis present

## 2019-04-12 DIAGNOSIS — G894 Chronic pain syndrome: Secondary | ICD-10-CM | POA: Diagnosis present

## 2019-04-12 DIAGNOSIS — G8929 Other chronic pain: Secondary | ICD-10-CM

## 2019-04-12 MED ORDER — GABAPENTIN 300 MG PO CAPS: 300.0000 mg | ORAL_CAPSULE | Freq: Two times a day (BID) | ORAL | 2 refills | Status: DC

## 2019-04-12 MED ORDER — HYDROCODONE-ACETAMINOPHEN 10-325 MG PO TABS: 1.0000 | ORAL_TABLET | Freq: Every day | ORAL | 0 refills | Status: DC | PRN

## 2019-04-12 NOTE — Progress Notes (Signed)
Subjective:    Patient ID: Shaun Lang, male    DOB: 11-10-1955, 64 y.o.   MRN: [id number]  HPI: Shaun Lang is a 64 y.o. male who returns for follow up appointment for chronic pain and medication refill. He states his pain is located in his neck, right shoulder and lower back pain. He rates his pain 7. His current exercise regime is walking and performing stretching exercises.  [name] Morphine equivalent is 15.00  MME.  Last UDS was Performed on 02/15/2019, it was consistent.    Pain Inventory Average Pain 7 Pain Right Now 7 My pain is burning, tingling and aching  In the last 24 hours, has pain interfered with the following? General activity 5 Relation with others 5 Enjoyment of life 6 What TIME of day is your pain at its worst? morning, night Sleep (in general) Poor  Pain is worse with: bending and inactivity Pain improves with: medication Relief from Meds: 6  Mobility walk without assistance ability to climb steps?  yes do you drive?  no Do you have any goals in this area?  yes  Function disabled: date disabled .  Neuro/Psych No problems in this area  Prior Studies Any changes since last visit?  no  Physicians involved in your care Any changes since last visit?  no   Family History  Problem Relation Age of Onset  . Diabetes Mother   . Cancer Sister        breast  . Diabetes Sister   . Stroke Sister 73  . Breast cancer Sister   . Colon cancer Neg Hx   . Esophageal cancer Neg Hx   . Rectal cancer Neg Hx   . Stomach cancer Neg Hx    Social History   Socioeconomic History  . Marital status: Single    Spouse name: Not on file  . Number of children: Not on file  . Years of education: Not on file  . Highest education level: Not on file  Occupational History    Comment: unemployed  Tobacco Use  . Smoking status: Current Every Day Smoker    Packs/day: 1.00    Types: Cigarettes  . Smokeless tobacco: Never Used  Substance and Sexual Activity   . Alcohol use: Yes    Alcohol/week: 3.0 standard drinks    Types: 3 Shots of liquor per week  . Drug use: No  . Sexual activity: Yes  Other Topics Concern  . Not on file  Social History Narrative  . Not on file   Social Determinants of Health   Financial Resource Strain:   . Difficulty of Paying Living Expenses: Not on file  Food Insecurity:   . Worried About Programme researcher, broadcasting/film/video in the Last Year: Not on file  . Ran Out of Food in the Last Year: Not on file  Transportation Needs:   . Lack of Transportation (Medical): Not on file  . Lack of Transportation (Non-Medical): Not on file  Physical Activity:   . Days of Exercise per Week: Not on file  . Minutes of Exercise per Session: Not on file  Stress:   . Feeling of Stress : Not on file  Social Connections:   . Frequency of Communication with Friends and Family: Not on file  . Frequency of Social Gatherings with Friends and Family: Not on file  . Attends Religious Services: Not on file  . Active Member of Clubs or Organizations: Not on file  . Attends Club  or Organization Meetings: Not on file  . Marital Status: Not on file   Past Surgical History:  Procedure Laterality Date  . BUNIONECTOMY Left 07/15/2017   with big toe fracture repair  . COLONOSCOPY  07/18/2012   polyps-Dr.Outlaw  . HERNIA REPAIR  04/1998   LIH  . NECK SURGERY  07/21/02 and 01/2006  . WRIST SURGERY  1992   right   Past Medical History:  Diagnosis Date  . Bell's palsy April 2010  . Chest pain with low risk for cardiac etiology    Low Risk Myoview Sept 2013  . DJD (degenerative joint disease)    disabilty secondary to neck issues  . Hyperlipidemia   . Hypertension   . Sleep apnea    does not use CPAP  . Smoker    BP (!) 147/88   Pulse 71   Temp (!) 97.5 F (36.4 C)   Ht 5\' 8"  (1.727 m)   Wt 192 lb (87.1 kg)   SpO2 97%   BMI 29.19 kg/m   Opioid Risk Score:   Fall Risk Score:  `1  Depression screen PHQ 2/9  Depression screen [facility] 2/9  08/23/2018 06/22/2018 02/28/2018 11/30/2017 07/29/2017 02/16/2017 10/21/2016  Decreased Interest 0 0 0 0 0 0 0  Down, Depressed, Hopeless 0 0 0 0 0 0 0  PHQ - 2 Score 0 0 0 0 0 0 0  Altered sleeping - - - - - - -  Tired, decreased energy - - - - - - -  Change in appetite - - - - - - -  Feeling bad or failure about yourself  - - - - - - -  Trouble concentrating - - - - - - -  Moving slowly or fidgety/restless - - - - - - -  Suicidal thoughts - - - - - - -  PHQ-9 Score - - - - - - -    Review of Systems  Constitutional: Positive for diaphoresis.  HENT: Negative.   Eyes: Negative.   Respiratory: Negative.   Cardiovascular: Negative.   Gastrointestinal: Negative.   Endocrine: Negative.   Genitourinary: Negative.   Musculoskeletal: Positive for arthralgias, back pain and neck pain.  Skin: Negative.   Allergic/Immunologic: Negative.   Neurological: Negative.   Hematological: Negative.   Psychiatric/Behavioral: Negative.   All other systems reviewed and are negative.      Objective:   Physical Exam Vitals and nursing note reviewed.  Constitutional:      Appearance: Normal appearance.  Cardiovascular:     Rate and Rhythm: Normal rate and regular rhythm.     Pulses: Normal pulses.     Heart sounds: Normal heart sounds.  Pulmonary:     Effort: Pulmonary effort is normal.     Breath sounds: Normal breath sounds.  Musculoskeletal:     Cervical back: Normal range of motion and neck supple.     Comments: Normal Muscle Bulk and Muscle Testing Reveals:  Upper Extremities: Full ROM and Muscle Strength 5/5 Right AC Joint Tenderness Lumbar Paraspinal Tenderness: L-4-L-5 Lower Extremities: Full ROM and Muscle Strength 5/5 Arises from Table with ease Narrow Based Gait   Skin:    General: Skin is warm and dry.  Neurological:     Mental Status: He is alert and oriented to person, place, and time.  Psychiatric:        Mood and Affect: Mood normal.        Behavior: Behavior normal.  Assessment & Plan:  1. Chronic left inguinal pain: S/P inguinal hernia (indirect) and mesh repair. Continue to Monitor.01/20//2021. Refilled: Hydrocodone 10/325 mg one tablet daily as needed, may take and extra tablet when pain is moderate to sever 15 days out of the month.# 17.Second script sent for the following month. We will continue the opioid monitoring program, this consists of regular clinic visits, examinations, urine drug screen, pill counts as well as use of [place] Controlled Substance Reporting system. 2. Lumbosacral Spondylosis: Continue with Exercise regime. Continue to Monitor.04/12/2019 3. Muscle Spasm:Continuecurrent medication regimen with Flexeril. Continue to monitor.04/12/2019 4.Chronic Bilateral Shoulder Pain R>LChronic Pain Syndrome:Continue HEP as tolerated.04/12/2019.  34minutes of face to face patient care time was spent during this visit. All questions were encouraged and answered.  F/U in 2 months

## 2019-06-08 ENCOUNTER — Encounter: Payer: Medicare Other | Attending: Physical Medicine & Rehabilitation | Admitting: Registered Nurse

## 2019-06-08 ENCOUNTER — Encounter: Payer: Self-pay | Admitting: Registered Nurse

## 2019-06-08 ENCOUNTER — Other Ambulatory Visit: Payer: Self-pay

## 2019-06-08 VITALS — BP 152/83 | HR 72 | Temp 97.7°F | Ht 68.0 in | Wt 190.0 lb

## 2019-06-08 DIAGNOSIS — M47817 Spondylosis without myelopathy or radiculopathy, lumbosacral region: Secondary | ICD-10-CM

## 2019-06-08 DIAGNOSIS — R1030 Lower abdominal pain, unspecified: Secondary | ICD-10-CM | POA: Insufficient documentation

## 2019-06-08 DIAGNOSIS — Z5181 Encounter for therapeutic drug level monitoring: Secondary | ICD-10-CM | POA: Insufficient documentation

## 2019-06-08 DIAGNOSIS — M150 Primary generalized (osteo)arthritis: Secondary | ICD-10-CM | POA: Diagnosis not present

## 2019-06-08 DIAGNOSIS — Z79891 Long term (current) use of opiate analgesic: Secondary | ICD-10-CM | POA: Diagnosis not present

## 2019-06-08 DIAGNOSIS — G894 Chronic pain syndrome: Secondary | ICD-10-CM | POA: Insufficient documentation

## 2019-06-08 DIAGNOSIS — G5790 Unspecified mononeuropathy of unspecified lower limb: Secondary | ICD-10-CM | POA: Insufficient documentation

## 2019-06-08 DIAGNOSIS — M7918 Myalgia, other site: Secondary | ICD-10-CM

## 2019-06-08 DIAGNOSIS — R1032 Left lower quadrant pain: Secondary | ICD-10-CM | POA: Insufficient documentation

## 2019-06-08 DIAGNOSIS — M7581 Other shoulder lesions, right shoulder: Secondary | ICD-10-CM | POA: Insufficient documentation

## 2019-06-08 DIAGNOSIS — M542 Cervicalgia: Secondary | ICD-10-CM

## 2019-06-08 MED ORDER — CYCLOBENZAPRINE HCL 10 MG PO TABS: ORAL_TABLET | ORAL | 2 refills | Status: DC

## 2019-06-08 MED ORDER — HYDROCODONE-ACETAMINOPHEN 10-325 MG PO TABS: 1.0000 | ORAL_TABLET | Freq: Every day | ORAL | 0 refills | Status: DC | PRN
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
## 2019-07-20 ENCOUNTER — Telehealth: Payer: Self-pay | Admitting: *Deleted

## 2019-07-20 DIAGNOSIS — Z5181 Encounter for therapeutic drug level monitoring: Secondary | ICD-10-CM

## 2019-07-20 MED ORDER — HYDROCODONE-ACETAMINOPHEN 10-325 MG PO TABS: 1.0000 | ORAL_TABLET | Freq: Every day | ORAL | 0 refills | Status: DC | PRN

## 2019-07-20 NOTE — Telephone Encounter (Signed)
[facility] does not have their hydrocodone shipment. I have cancelled that  Rx. Please send to [facility].

## 2019-07-20 NOTE — Telephone Encounter (Signed)
PMP was Reviewed: Last Hydrocodone was filled on 06/19/2019. Hydrocodone e-scribed to [facility] on [address]. Placed a call to [name] regarding the above. He verbalizes understanding.

## 2019-07-21 ENCOUNTER — Telehealth: Payer: Self-pay | Admitting: *Deleted

## 2019-07-21 DIAGNOSIS — Z5181 Encounter for therapeutic drug level monitoring: Secondary | ICD-10-CM

## 2019-07-21 MED ORDER — HYDROCODONE-ACETAMINOPHEN 10-325 MG PO TABS: 1.0000 | ORAL_TABLET | Freq: Every day | ORAL | 0 refills | Status: DC | PRN

## 2019-07-21 NOTE — Telephone Encounter (Addendum)
[facility] sent a fax that pain medication is Print production planner. Rx cancelled by pharmacy.  Contacted patient. [facility] and [facility] location

## 2019-07-21 NOTE — Telephone Encounter (Signed)
PMP was Reviewed. [facility] sent faxed to report Hydrocodone was on back order and prescription was cancelled. [facility]. Shaun Lang was called and he is awre of the above and verbalizes understanding.

## 2019-07-24 ENCOUNTER — Other Ambulatory Visit: Payer: Self-pay

## 2019-07-25 ENCOUNTER — Encounter: Payer: Self-pay | Admitting: Nurse Practitioner

## 2019-07-25 ENCOUNTER — Ambulatory Visit (INDEPENDENT_AMBULATORY_CARE_PROVIDER_SITE_OTHER): Payer: Medicare Other | Admitting: Nurse Practitioner

## 2019-07-25 VITALS — BP 130/70 | HR 72 | Temp 96.1°F | Ht 68.0 in | Wt 191.6 lb

## 2019-07-25 DIAGNOSIS — G4733 Obstructive sleep apnea (adult) (pediatric): Secondary | ICD-10-CM | POA: Diagnosis not present

## 2019-07-25 DIAGNOSIS — Z9989 Dependence on other enabling machines and devices: Secondary | ICD-10-CM

## 2019-07-25 DIAGNOSIS — Z125 Encounter for screening for malignant neoplasm of prostate: Secondary | ICD-10-CM

## 2019-07-25 DIAGNOSIS — E782 Mixed hyperlipidemia: Secondary | ICD-10-CM

## 2019-07-25 DIAGNOSIS — I10 Essential (primary) hypertension: Secondary | ICD-10-CM

## 2019-07-25 MED ORDER — TRIAMTERENE-HCTZ 37.5-25 MG PO TABS: 1.0000 | ORAL_TABLET | Freq: Every day | ORAL | 1 refills | Status: DC

## 2019-07-25 MED ORDER — LISINOPRIL 5 MG PO TABS: 5.0000 mg | ORAL_TABLET | Freq: Every day | ORAL | 3 refills | Status: DC

## 2019-07-25 MED ORDER — ATORVASTATIN CALCIUM 40 MG PO TABS: 40.0000 mg | ORAL_TABLET | Freq: Every day | ORAL | 3 refills | Status: DC

## 2019-07-25 NOTE — Progress Notes (Signed)
Subjective:  Patient ID: Shaun Lang, male    DOB: Aug 06, 1955  Age: 64 y.o. MRN: [id number]  CC: Follow-up (6 mo f/u-HTN and hyperlipidemia-pt is not fasting//pt doesn't check BP but feels its ok)  HPI HTN: Improved BP with lisinopril and maxzide. He admits to occasional non compliance with medication and diet. He is not using CPAP machine at this time. He states machines was returned because he was not using as directed. He reports non restorative and interrupted sleep due to snoring and jerking. BP Readings from Last 3 Encounters:  07/25/19 130/70  06/08/19 (!) 152/83  04/12/19 (!) 147/88   Reviewed past Medical, Social and Family history today.  Outpatient Medications Prior to Visit  Medication Sig Dispense Refill  . cyclobenzaprine (FLEXERIL) 10 MG tablet TAKE 1 TABLET BY MOUTH TWICE DAILY AS NEEDED FOR MUSCLE SPASM 60 tablet 2  . diclofenac (VOLTAREN) 75 MG EC tablet Take 1 tablet (75 mg total) by mouth 2 (two) times daily with a meal. 60 tablet 3  . gabapentin (NEURONTIN) 300 MG capsule Take 1 capsule (300 mg total) by mouth 2 (two) times daily. 60 capsule 2  . HYDROcodone-acetaminophen (NORCO) 10-325 MG tablet Take 1 tablet by mouth daily as needed (pain). May Take an extra tablet when pain is moderate to sever, 15 days out of the month 45 tablet 0  . Multiple Vitamin (MULTIVITAMIN WITH MINERALS) TABS tablet Take 1 tablet by mouth daily.    [name] atorvastatin (LIPITOR) 40 MG tablet Take 1 tablet (40 mg total) by mouth daily at 6 PM. 90 tablet 3  . lisinopril (ZESTRIL) 5 MG tablet Take 1 tablet (5 mg total) by mouth daily. 90 tablet 3  . triamterene-hydrochlorothiazide (MAXZIDE-25) 37.5-25 MG tablet Take 1 tablet by mouth daily. 90 tablet 1   No facility-administered medications prior to visit.    ROS See HPI  Objective:  BP 130/70   Pulse 72   Temp (!) 96.1 F (35.6 C) (Tympanic)   Ht 5\' 8"  (1.727 m)   Wt 191 lb 9.6 oz (86.9 kg)   SpO2 98%   BMI 29.13 kg/m   BP  Readings from Last 3 Encounters:  07/25/19 130/70  06/08/19 (!) 152/83  04/12/19 (!) 147/88   Wt Readings from Last 3 Encounters:  07/25/19 191 lb 9.6 oz (86.9 kg)  06/08/19 190 lb (86.2 kg)  04/12/19 192 lb (87.1 kg)   Physical Exam Cardiovascular:     Rate and Rhythm: Normal rate and regular rhythm.     Pulses: Normal pulses.     Heart sounds: Gallop present.      Comments: S4 Pulmonary:     Effort: Pulmonary effort is normal.     Breath sounds: Normal breath sounds.  Musculoskeletal:     Right lower leg: No edema.     Left lower leg: No edema.  Neurological:     Mental Status: He is alert and oriented to person, place, and time.    Lab Results  Component Value Date   WBC 3.0 (L) 07/15/2017   HGB 13.2 07/15/2017   HCT 40.0 07/15/2017   PLT 156.0 07/15/2017   GLUCOSE 97 10/07/2018   CHOL 200 10/07/2018   TRIG 79.0 10/07/2018   HDL 59.80 10/07/2018   LDLCALC 125 (H) 10/07/2018   ALT 18 10/07/2018   AST 16 10/07/2018   NA 138 10/07/2018   K 4.0 10/07/2018   CL 105 10/07/2018   CREATININE 1.07 10/07/2018   BUN 12 10/07/2018  CO2 28 10/07/2018   TSH 0.64 10/30/2016   PSA 0.54 01/06/2018   HGBA1C 5.6 12/19/2015    Assessment & Plan:  This visit occurred during the SARS-CoV-2 public health emergency.  Safety protocols were in place, including screening questions prior to the visit, additional usage of staff PPE, and extensive cleaning of exam room while observing appropriate contact time as indicated for disinfecting solutions.   Shaun Lang was seen today for follow-up.  Diagnoses and all orders for this visit:  Essential hypertension -     Basic metabolic panel; Future -     TSH; Future -     lisinopril (ZESTRIL) 5 MG tablet; Take 1 tablet (5 mg total) by mouth daily. -     triamterene-hydrochlorothiazide (MAXZIDE-25) 37.5-25 MG tablet; Take 1 tablet by mouth daily.  Obstructive sleep apnea treated with continuous positive airway pressure (CPAP)  Mixed  hyperlipidemia -     Lipid panel; Future -     atorvastatin (LIPITOR) 40 MG tablet; Take 1 tablet (40 mg total) by mouth daily at 6 PM.  Prostate cancer screening -     PSA; Future   I am having Shaun Lang maintain his multivitamin with minerals, diclofenac, gabapentin, cyclobenzaprine, HYDROcodone-acetaminophen, atorvastatin, lisinopril, and triamterene-hydrochlorothiazide.  Meds ordered this encounter  Medications  . atorvastatin (LIPITOR) 40 MG tablet    Sig: Take 1 tablet (40 mg total) by mouth daily at 6 PM.    Dispense:  90 tablet    Refill:  3    Order Specific Question:   Supervising Provider    Answer:   Ronnald Nian [[id number]]  . lisinopril (ZESTRIL) 5 MG tablet    Sig: Take 1 tablet (5 mg total) by mouth daily.    Dispense:  90 tablet    Refill:  3    Order Specific Question:   Supervising Provider    Answer:   Ronnald Nian [[id number]]  . triamterene-hydrochlorothiazide (MAXZIDE-25) 37.5-25 MG tablet    Sig: Take 1 tablet by mouth daily.    Dispense:  90 tablet    Refill:  1    Order Specific Question:   Supervising Provider    Answer:   Ronnald Nian [[id number]]    Problem List Items Addressed This Visit      Cardiovascular and Mediastinum   Hypertension - Primary    Improved BP with lisinopril and maxzide. He admits to occasional non compliance with medication and diet. BP Readings from Last 3 Encounters:  07/25/19 130/70  06/08/19 (!) 152/83  04/12/19 (!) 147/88   Advised about the importance of BP control and possible complications from long standing HTN (CAD, CKD, CVA). Maintain current medications and DASH diet F/up in 80month      Relevant Medications   atorvastatin (LIPITOR) 40 MG tablet   lisinopril (ZESTRIL) 5 MG tablet   triamterene-hydrochlorothiazide (MAXZIDE-25) 37.5-25 MG tablet   Other Relevant Orders   Basic metabolic panel   TSH     Respiratory   Obstructive sleep apnea treated with continuous positive airway  pressure (CPAP)    He is not using CPAP machine at this time. He states machines was returned because he was not using as directed. He reports non restorative and interrupted sleep due to snoring and jerking.  Advised him about correlation between uncontrolled sleep apnea and heart disease, DM, heart failure, Obesity, dyslipidemia, dementia, and CVA. Advised to contact [facility] for CPAP machine. He verbalized understanding        Other  Hyperlipidemia   Relevant Medications   atorvastatin (LIPITOR) 40 MG tablet   lisinopril (ZESTRIL) 5 MG tablet   triamterene-hydrochlorothiazide (MAXZIDE-25) 37.5-25 MG tablet   Other Relevant Orders   Lipid panel    Other Visit Diagnoses    Prostate cancer screening       Relevant Orders   PSA       Follow-up: Return in about 6 months (around 01/25/2020) for HTN and hyperlipidemia (fasting, ).  Alysia Penna, NP

## 2019-07-25 NOTE — Patient Instructions (Addendum)
Go to [facility] on [facility] for blood draw. You need to be fasting at least 4-6hrs prior to blood draw.  Contact [facility] for CPAP machine: [phone]  Maintain current medications

## 2019-07-25 NOTE — Assessment & Plan Note (Signed)
He is not using CPAP machine at this time. He states machines was returned because he was not using as directed. He reports non restorative and interrupted sleep due to snoring and jerking.  Advised him about correlation between uncontrolled sleep apnea and heart disease, DM, heart failure, Obesity, dyslipidemia, dementia, and CVA. Advised to contact [facility] for CPAP machine. He verbalized understanding

## 2019-07-25 NOTE — Assessment & Plan Note (Addendum)
Improved BP with lisinopril and maxzide. He admits to occasional non compliance with medication and diet. BP Readings from Last 3 Encounters:  07/25/19 130/70  06/08/19 (!) 152/83  04/12/19 (!) 147/88   Advised about the importance of BP control and possible complications from long standing HTN (CAD, CKD, CVA). Maintain current medications and DASH diet F/up in 60month

## 2019-07-28 ENCOUNTER — Other Ambulatory Visit (INDEPENDENT_AMBULATORY_CARE_PROVIDER_SITE_OTHER): Payer: Medicare Other

## 2019-07-28 DIAGNOSIS — Z125 Encounter for screening for malignant neoplasm of prostate: Secondary | ICD-10-CM

## 2019-07-28 DIAGNOSIS — I10 Essential (primary) hypertension: Secondary | ICD-10-CM

## 2019-07-28 DIAGNOSIS — E782 Mixed hyperlipidemia: Secondary | ICD-10-CM

## 2019-07-28 LAB — LIPID PANEL
Cholesterol: 189 mg/dL (ref 0–200)
HDL: 54.2 mg/dL (ref >39.00)
LDL Cholesterol: 118 mg/dL — ABNORMAL HIGH (ref 0–99)
NonHDL: 135.25
Total CHOL/HDL Ratio: 3
Triglycerides: 88 mg/dL (ref 0.0–149.0)
VLDL: 17.6 mg/dL (ref 0.0–40.0)

## 2019-07-28 LAB — BASIC METABOLIC PANEL
BUN: 14 mg/dL (ref 6–23)
CO2: 28 mEq/L (ref 19–32)
Calcium: 9.5 mg/dL (ref 8.4–10.5)
Chloride: 105 mEq/L (ref 96–112)
Creatinine, Ser: 1.11 mg/dL (ref 0.40–1.50)
GFR: 80.65 mL/min (ref >60.00)
Glucose, Bld: 97 mg/dL (ref 70–99)
Potassium: 3.8 mEq/L (ref 3.5–5.1)
Sodium: 140 mEq/L (ref 135–145)

## 2019-07-28 LAB — TSH: TSH: 0.56 u[IU]/mL (ref 0.35–4.50)

## 2019-07-28 LAB — PSA: PSA: 0.54 ng/mL (ref 0.10–4.00)

## 2019-08-09 ENCOUNTER — Encounter: Payer: Self-pay | Admitting: Registered Nurse

## 2019-08-09 ENCOUNTER — Other Ambulatory Visit: Payer: Self-pay

## 2019-08-09 ENCOUNTER — Encounter: Payer: Medicare Other | Attending: Physical Medicine & Rehabilitation | Admitting: Registered Nurse

## 2019-08-09 VITALS — BP 135/87 | HR 65 | Temp 98.7°F | Ht 68.0 in | Wt 189.8 lb

## 2019-08-09 DIAGNOSIS — M25512 Pain in left shoulder: Secondary | ICD-10-CM

## 2019-08-09 DIAGNOSIS — R1030 Lower abdominal pain, unspecified: Secondary | ICD-10-CM | POA: Insufficient documentation

## 2019-08-09 DIAGNOSIS — G8929 Other chronic pain: Secondary | ICD-10-CM

## 2019-08-09 DIAGNOSIS — Z79891 Long term (current) use of opiate analgesic: Secondary | ICD-10-CM | POA: Diagnosis not present

## 2019-08-09 DIAGNOSIS — G894 Chronic pain syndrome: Secondary | ICD-10-CM | POA: Insufficient documentation

## 2019-08-09 DIAGNOSIS — M7918 Myalgia, other site: Secondary | ICD-10-CM

## 2019-08-09 DIAGNOSIS — M47817 Spondylosis without myelopathy or radiculopathy, lumbosacral region: Secondary | ICD-10-CM | POA: Diagnosis not present

## 2019-08-09 DIAGNOSIS — R1032 Left lower quadrant pain: Secondary | ICD-10-CM | POA: Diagnosis not present

## 2019-08-09 DIAGNOSIS — G5790 Unspecified mononeuropathy of unspecified lower limb: Secondary | ICD-10-CM | POA: Diagnosis not present

## 2019-08-09 DIAGNOSIS — M7581 Other shoulder lesions, right shoulder: Secondary | ICD-10-CM | POA: Insufficient documentation

## 2019-08-09 DIAGNOSIS — M150 Primary generalized (osteo)arthritis: Secondary | ICD-10-CM | POA: Insufficient documentation

## 2019-08-09 DIAGNOSIS — F10920 Alcohol use, unspecified with intoxication, uncomplicated: Secondary | ICD-10-CM | POA: Insufficient documentation

## 2019-08-09 DIAGNOSIS — Z5181 Encounter for therapeutic drug level monitoring: Secondary | ICD-10-CM | POA: Diagnosis not present

## 2019-08-09 MED ORDER — HYDROCODONE-ACETAMINOPHEN 10-325 MG PO TABS: 1.0000 | ORAL_TABLET | Freq: Every day | ORAL | 0 refills | Status: DC | PRN

## 2019-08-09 NOTE — Progress Notes (Signed)
Subjective:    Patient ID: Shaun Lang, male    DOB: 11-21-1955, 64 y.o.   MRN: [id number]  HPI: Shaun Lang is a 64 y.o. male who returns for follow up appointment for chronic pain and medication refill. He states his pain is located in his left shoulder and lower back pain. He rates his pain 6. His  current exercise regime is walking and performing stretching exercises.  Shaun Lang Morphine equivalent is 15.00  MME.  UDS ordered today.   Pain Inventory Average Pain 6 Pain Right Now 6  My pain is sharp, tingling and aching  In the last 24 hours, has pain interfered with the following? General activity 7 Relation with others 8 Enjoyment of life 7 What TIME of day is your pain at its worst? daytime and night Sleep (in general) Poor  Pain is worse with: bending, unsure and some activites Pain improves with: medication Relief from Meds: 6  Mobility walk without assistance how many minutes can you walk? 4-5 ability to climb steps?  yes do you drive?  no  Function disabled: date disabled .  Neuro/Psych tingling spasms  Prior Studies Any changes since last visit?  no  Physicians involved in your care Any changes since last visit?  no   Family History  Problem Relation Age of Onset  . Diabetes Mother   . Cancer Sister        breast  . Diabetes Sister   . Stroke Sister 13  . Breast cancer Sister   . Colon cancer Neg Hx   . Esophageal cancer Neg Hx   . Rectal cancer Neg Hx   . Stomach cancer Neg Hx    Social History   Socioeconomic History  . Marital status: Single    Spouse name: Not on file  . Number of children: Not on file  . Years of education: Not on file  . Highest education level: Not on file  Occupational History    Comment: unemployed  Tobacco Use  . Smoking status: Current Every Day Smoker    Packs/day: 1.00    Types: Cigarettes  . Smokeless tobacco: Never Used  Substance and Sexual Activity  . Alcohol use: Yes    Alcohol/week: 3.0  standard drinks    Types: 3 Shots of liquor per week  . Drug use: No  . Sexual activity: Yes  Other Topics Concern  . Not on file  Social History Narrative  . Not on file   Social Determinants of Health   Financial Resource Strain:   . Difficulty of Paying Living Expenses:   Food Insecurity:   . Worried About Programme researcher, broadcasting/film/video in the Last Year:   . Barista in the Last Year:   Transportation Needs:   . Freight forwarder (Medical):   [name] Lack of Transportation (Non-Medical):   Physical Activity:   . Days of Exercise per Week:   . Minutes of Exercise per Session:   Stress:   . Feeling of Stress :   Social Connections:   . Frequency of Communication with Friends and Family:   . Frequency of Social Gatherings with Friends and Family:   . Attends Religious Services:   . Active Member of Clubs or Organizations:   . Attends Banker Meetings:   [name] Marital Status:    Past Surgical History:  Procedure Laterality Date  . BUNIONECTOMY Left 07/15/2017   with big toe fracture repair  . COLONOSCOPY  07/18/2012   polyps-Dr.Outlaw  . HERNIA REPAIR  04/1998   LIH  . NECK SURGERY  07/21/02 and 01/2006  . WRIST SURGERY  1992   right   Past Medical History:  Diagnosis Date  . Bell's palsy April 2010  . Chest pain with low risk for cardiac etiology    Low Risk Myoview Sept 2013  . DJD (degenerative joint disease)    disabilty secondary to neck issues  . Hyperlipidemia   . Hypertension   . Sleep apnea    does not use CPAP  . Smoker    BP 135/87   Pulse 65   Temp 98.7 F (37.1 C)   Ht 5\' 8"  (1.727 m)   Wt 189 lb 12.8 oz (86.1 kg)   SpO2 98%   BMI 28.86 kg/m   Opioid Risk Score:   Fall Risk Score:  `1  Depression screen PHQ 2/9  Depression screen [facility] 2/9 07/25/2019 08/23/2018 06/22/2018 02/28/2018 11/30/2017 07/29/2017 02/16/2017  Decreased Interest 0 0 0 0 0 0 0  Down, Depressed, Hopeless 0 0 0 0 0 0 0  PHQ - 2 Score 0 0 0 0 0 0 0  Altered sleeping - -  - - - - -  Tired, decreased energy - - - - - - -  Change in appetite - - - - - - -  Feeling bad or failure about yourself  - - - - - - -  Trouble concentrating - - - - - - -  Moving slowly or fidgety/restless - - - - - - -  Suicidal thoughts - - - - - - -  PHQ-9 Score - - - - - - -    Review of Systems  Constitutional: Positive for diaphoresis.  Gastrointestinal: Positive for constipation.       Objective:   Physical Exam Vitals and nursing note reviewed.  Constitutional:      Appearance: Normal appearance.  Cardiovascular:     Rate and Rhythm: Normal rate and regular rhythm.     Pulses: Normal pulses.     Heart sounds: Normal heart sounds.  Pulmonary:     Effort: Pulmonary effort is normal.     Breath sounds: Normal breath sounds.  Musculoskeletal:     Cervical back: Normal range of motion and neck supple.     Comments: Normal Muscle Bulk and Muscle Testing Reveals:  Upper Extremities: Full ROM and Muscle Strength 5/5  Lumbar Paraspinal Tenderness: L-3-L-5 Lower Extremities: Full  ROM and Muscle Strength  5/5 Arises from Table with Ease Narrow Based Gait   Skin:    General: Skin is warm and dry.  Neurological:     Mental Status: He is alert and oriented to person, place, and time.  Psychiatric:        Mood and Affect: Mood normal.        Behavior: Behavior normal.           Assessment & Plan:  1. Chronic left inguinal pain: S/P inguinal hernia (indirect) and mesh repair. Continue to Monitor.05/19//2021. Refilled: Hydrocodone 10/325 mg one tablet daily as needed, may take and extra tablet when pain is moderate to sever 15 days out of the month.# 45.Second script sent for the following month. We will continue the opioid monitoring program, this consists of regular clinic visits, examinations, urine drug screen, pill counts as well as use of 07-11-1994 Controlled Substance Reporting system. 2. Lumbosacral Spondylosis: Continue with Exercise regime. Continue  to Monitor.08/09/2019 3.  Muscle Spasm:Continuecurrent medication regimen withFlexeril. Continue to monitor.08/09/2019 4.Chronic Left Shoulder Pain: Continue HEP as Tolerated. Continue to Monitor.  5.Chronic Pain Syndrome:Continue HEP as tolerated.08/09/2019.  61minutes of face to face patient care time was spent during this visit. All questions were encouraged and answered.  F/U in 2 months

## 2019-08-14 LAB — TOXASSURE SELECT,+ANTIDEPR,UR

## 2019-08-23 ENCOUNTER — Telehealth: Payer: Self-pay | Admitting: *Deleted

## 2019-08-23 NOTE — Telephone Encounter (Signed)
Urine drug screen for this encounter is consistent for prescribed medication 

## 2019-09-12 NOTE — Progress Notes (Signed)
Subjective:   Shaun Lang is a 64 y.o. male who presents for an Initial Medicare Annual Wellness Visit.  Review of Systems     Cardiac Risk Factors include: advanced age (>26men, >25 women);dyslipidemia;hypertension;male gender;smoking/ tobacco exposure     Objective:    Today's Vitals   09/14/19 1331  BP: 140/70  Pulse: 73  Resp: 16  Temp: 97.6 F (36.4 C)  TempSrc: Temporal  SpO2: 99%  Weight: 195 lb 6.4 oz (88.6 kg)  Height: 5\' 8"  (1.727 m)   Body mass index is 29.71 kg/m.  Advanced Directives 09/14/2019 12/05/2016 10/21/2016 08/21/2016 07/07/2016 04/29/2016 02/27/2016  Does Patient Have a Medical Advance Directive? No No No No No No No  Would patient like information on creating a medical advance directive? Yes (ED - Information included in AVS) Yes (ED - Information included in AVS) Yes (MAU/Ambulatory/Procedural Areas - Information given) Yes (MAU/Ambulatory/Procedural Areas - Information given) - - -    Current Medications (verified) Outpatient Encounter Medications as of 09/14/2019  Medication Sig  . atorvastatin (LIPITOR) 40 MG tablet Take 1 tablet (40 mg total) by mouth daily at 6 PM.  . cyclobenzaprine (FLEXERIL) 10 MG tablet TAKE 1 TABLET BY MOUTH TWICE DAILY AS NEEDED FOR MUSCLE SPASM  . diclofenac (VOLTAREN) 75 MG EC tablet Take 1 tablet (75 mg total) by mouth 2 (two) times daily with a meal.  . gabapentin (NEURONTIN) 300 MG capsule Take 1 capsule (300 mg total) by mouth 2 (two) times daily.  09/16/2019 HYDROcodone-acetaminophen (NORCO) 10-325 MG tablet Take 1 tablet by mouth daily as needed (pain). May Take an extra tablet when pain is moderate to sever, 15 days out of the month  . lisinopril (ZESTRIL) 5 MG tablet Take 1 tablet (5 mg total) by mouth daily.  . Multiple Vitamin (MULTIVITAMIN WITH MINERALS) TABS tablet Take 1 tablet by mouth daily.  [name] triamterene-hydrochlorothiazide (MAXZIDE-25) 37.5-25 MG tablet Take 1 tablet by mouth daily.   No facility-administered  encounter medications on file as of 09/14/2019.    Allergies (verified) Patient has no known allergies.   History: Past Medical History:  Diagnosis Date  . Bell's palsy April 2010  . Chest pain with low risk for cardiac etiology    Low Risk Myoview Sept 2013  . DJD (degenerative joint disease)    disabilty secondary to neck issues  . Hyperlipidemia   . Hypertension   . Sleep apnea    does not use CPAP  . Smoker    Past Surgical History:  Procedure Laterality Date  . BUNIONECTOMY Left 07/15/2017   with big toe fracture repair  . COLONOSCOPY  07/18/2012   polyps-Dr.Outlaw  . HERNIA REPAIR  04/1998   LIH  . NECK SURGERY  07/21/02 and 01/2006  . WRIST SURGERY  1992   right   Family History  Problem Relation Age of Onset  . Diabetes Mother   . Cancer Sister        breast  . Diabetes Sister   . Stroke Sister 25  . Breast cancer Sister   . Colon cancer Neg Hx   . Esophageal cancer Neg Hx   . Rectal cancer Neg Hx   . Stomach cancer Neg Hx    Social History   Socioeconomic History  . Marital status: Single    Spouse name: Not on file  . Number of children: Not on file  . Years of education: Not on file  . Highest education level: Not on file  Occupational  History    Comment: unemployed  Tobacco Use  . Smoking status: Current Every Day Smoker    Packs/day: 1.00    Types: Cigarettes  . Smokeless tobacco: Never Used  Vaping Use  . Vaping Use: Never used  Substance and Sexual Activity  . Alcohol use: Yes    Alcohol/week: 3.0 standard drinks    Types: 3 Shots of liquor per week  . Drug use: No  . Sexual activity: Yes  Other Topics Concern  . Not on file  Social History Narrative  . Not on file   Social Determinants of Health   Financial Resource Strain: Low Risk   . Difficulty of Paying Living Expenses: Not hard at all  Food Insecurity: No Food Insecurity  . Worried About Programme researcher, broadcasting/film/video in the Last Year: Never true  . Ran Out of Food in the Last  Year: Never true  Transportation Needs: No Transportation Needs  . Lack of Transportation (Medical): No  . Lack of Transportation (Non-Medical): No  Physical Activity: Insufficiently Active  . Days of Exercise per Week: 3 days  . Minutes of Exercise per Session: 10 min  Stress: No Stress Concern Present  . Feeling of Stress : Not at all  Social Connections: Unknown  . Frequency of Communication with Friends and Family: More than three times a week  . Frequency of Social Gatherings with Friends and Family: More than three times a week  . Attends Religious Services: Never  . Active Member of Clubs or Organizations: No  . Attends Banker Meetings: Never  . Marital Status: Not on file    Tobacco Counseling Ready to quit: Not Answered Counseling given: Not Answered   Clinical Intake:  Pre-visit preparation completed: Yes  Pain : No/denies pain     Nutritional Status: BMI 25 -29 Overweight Diabetes: No  How often do you need to have someone help you when you read instructions, pamphlets, or other written materials from your doctor or pharmacy?: 1 - Never What is the last grade level you completed in school?: some college  Diabetic?No  Interpreter Needed?: No  Information entered by :: Thomasenia Sales LPN  Activities of Daily Living In your present state of health, do you have any difficulty performing the following activities: 09/14/2019  Hearing? N  Vision? N  Difficulty concentrating or making decisions? Y  Comment occasionally forgetful  Walking or climbing stairs? N  Dressing or bathing? N  Doing errands, shopping? N  Preparing Food and eating ? N  Using the Toilet? N  In the past six months, have you accidently leaked urine? N  Do you have problems with loss of bowel control? N  Managing your Medications? N  Managing your Finances? N  Housekeeping or managing your Housekeeping? N  Some recent data might be hidden    Patient Care Team: [place],  Bonna Gains, NP as PCP - General (Internal Medicine)  Indicate any recent Medical Services you may have received from other than [facility] providers in the past year (date may be approximate).     Assessment:   This is a routine wellness examination for [facility].  Hearing/Vision screen  Hearing Screening   125Hz  250Hz  500Hz  1000Hz  2000Hz  3000Hz  4000Hz  6000Hz  8000Hz   Right ear:           Left ear:           Comments: No issues  Vision Screening Comments: Wears glasses Last eye exam-2020  Dietary issues and  exercise activities discussed: Current Exercise Habits: Home exercise routine, Type of exercise: strength training/weights, Time (Minutes): 10, Frequency (Times/Week): 3, Weekly Exercise (Minutes/Week): 30, Intensity: Mild  Goals    . Exercise 3x per week (30 min per time)    . Quit Smoking      Depression Screen PHQ 2/9 Scores 09/14/2019 07/25/2019 08/23/2018 06/22/2018 02/28/2018 11/30/2017 07/29/2017  PHQ - 2 Score 0 0 0 0 0 0 0  PHQ- 9 Score - - - - - - -    Fall Risk Fall Risk  09/14/2019 08/09/2019 07/25/2019 06/08/2019 04/12/2019  Falls in the past year? 1 0 1 0 0  Comment - - - - -  Number falls in past yr: 0 - 0 - -  Comment - - - - -  Injury with Fall? 0 - 0 - -  Risk Factor Category  - - - - -  Risk for fall due to : - - - - -  Follow up Falls prevention discussed - - - -  Comment - - - - -    Any stairs in or around the home? Yes  If so, are there any without handrails? No  Home free of loose throw rugs in walkways, pet beds, electrical cords, etc? Yes  Adequate lighting in your home to reduce risk of falls? Yes   ASSISTIVE DEVICES UTILIZED TO PREVENT FALLS:  Life alert? No  Use of a cane, walker or w/c? No  Grab bars in the bathroom? No  Shower chair or bench in shower? No  Elevated toilet seat or a handicapped toilet? No   TIMED UP AND GO:  Was the test performed? Yes .  Length of time to ambulate 10 feet: 9 sec.   Gait slow and steady without use of  assistive device  Cognitive Function:     6CIT Screen 09/14/2019  What Year? 0 points  What month? 0 points  What time? 0 points  Count back from 20 0 points  Months in reverse 0 points  Repeat phrase 0 points  Total Score 0    Immunizations Immunization History  Administered Date(s) Administered  . PFIZER SARS-COV-2 Vaccination 05/28/2019, 06/25/2019    TDAP status: Due, Education has been provided regarding the importance of this vaccine. Advised may receive this vaccine at local pharmacy or Health Dept. Aware to provide a copy of the vaccination record if obtained from local pharmacy or Health Dept. Verbalized acceptance and understanding. Flu Vaccine status: Declined, Education has been provided regarding the importance of this vaccine but patient still declined. Advised may receive this vaccine at local pharmacy or Health Dept. Aware to provide a copy of the vaccination record if obtained from local pharmacy or Health Dept. Verbalized acceptance and understanding.  Pneumococcal vaccine status: Due at age 65  Covid-19 vaccine status: Completed vaccines  Qualifies for Shingles Vaccine? Yes   Zostavax completed No   Shingrix Completed?: No.    Education has been provided regarding the importance of this vaccine. Patient has been advised to call insurance company to determine out of pocket expense if they have not yet received this vaccine. Advised may also receive vaccine at local pharmacy or Health Dept. Verbalized acceptance and understanding.  Screening Tests Health Maintenance  Topic Date Due  . INFLUENZA VACCINE  10/22/2019  . TETANUS/TDAP  11/21/2024  . COLONOSCOPY  11/02/2027  . COVID-19 Vaccine  Completed  . Hepatitis C Screening  Completed  . HIV Screening  Completed    Health  Maintenance  There are no preventive care reminders to display for this patient.  Colorectal cancer screening: Completed 11/01/2017. Repeat every 10 years  Lung Cancer Screening: (Low  Dose CT Chest recommended if Age 69-80 years, 30 pack-year currently smoking OR have quit w/in 15years.) does qualify.   Lung Cancer Screening Referral: Message sent to Abigail Miyamoto for scheduling.Patient aware that someone will be calling him to schedule.  Additional Screening:  Hepatitis C Screening: Completed 12/19/2015  Vision Screening: Recommended annual ophthalmology exams for early detection of glaucoma and other disorders of the eye. Is the patient up to date with their annual eye exam?  Yes  Who is the provider or what is the name of the office in which the patient attends annual eye exams? [facility]   Dental Screening: Recommended annual dental exams for proper oral hygiene  Community Resource Referral / Chronic Care Management: CRR required this visit?  No   CCM required this visit?  No      Plan:     I have personally reviewed and noted the following in the patient's chart:   . Medical and social history . Use of alcohol, tobacco or illicit drugs  . Current medications and supplements . Functional ability and status . Nutritional status . Physical activity . Advanced directives . List of other physicians . Hospitalizations, surgeries, and ER visits in previous 12 months . Vitals . Screenings to include cognitive, depression, and falls . Referrals and appointments  In addition, I have reviewed and discussed with patient certain preventive protocols, quality metrics, and best practice recommendations. A written personalized care plan for preventive services as well as general preventive health recommendations were provided to patient.     Roanna Raider, LPN   [id number]  Nurse Health Advisor  Nurse Notes: None

## 2019-09-13 ENCOUNTER — Other Ambulatory Visit: Payer: Self-pay

## 2019-09-14 ENCOUNTER — Ambulatory Visit (INDEPENDENT_AMBULATORY_CARE_PROVIDER_SITE_OTHER): Payer: Medicare Other

## 2019-09-14 VITALS — BP 140/70 | HR 73 | Temp 97.6°F | Resp 16 | Ht 68.0 in | Wt 195.4 lb

## 2019-09-14 DIAGNOSIS — Z0000 Encounter for general adult medical examination without abnormal findings: Secondary | ICD-10-CM

## 2019-09-14 NOTE — Patient Instructions (Addendum)
Shaun Lang , Thank you for taking time to come for your Medicare Wellness Visit. I appreciate your ongoing commitment to your health goals. Please review the following plan we discussed and let me know if I can assist you in the future.   Screening recommendations/referrals: Colonoscopy: Completed 11/01/2017-Due 11/02/2027 Recommended yearly ophthalmology/optometry visit for glaucoma screening and checkup Recommended yearly dental visit for hygiene and checkup  Lung Cancer Screening: Ordered. Someone will be calling you to schedule.  Vaccinations: Influenza vaccine: Due 11/2019 Pneumococcal vaccine: Due at age 65 Tdap vaccine: 2-3 years ago per our discussion. Shingles vaccine: Discuss with pharmacy   Covid-19: Completed vaccines  Advanced directives: Discussed. Information given at visit.  Conditions/risks identified: See problem list   Next appointment: Follow up in one year for your annual wellness visit 09/19/2020 @ 11:15am.  Preventive Care 40-64 Years, Male Preventive care refers to lifestyle choices and visits with your health care provider that can promote health and wellness. What does preventive care include?  A yearly physical exam. This is also called an annual well check.  Dental exams once or twice a year.  Routine eye exams. Ask your health care provider how often you should have your eyes checked.  Personal lifestyle choices, including:  Daily care of your teeth and gums.  Regular physical activity.  Eating a healthy diet.  Avoiding tobacco and drug use.  Limiting alcohol use.  Practicing safe sex.  Taking low-dose aspirin every day starting at age 70. What happens during an annual well check? The services and screenings done by your health care provider during your annual well check will depend on your age, overall health, lifestyle risk factors, and family history of disease. Counseling  Your health care provider may ask you questions about  your:  Alcohol use.  Tobacco use.  Drug use.  Emotional well-being.  Home and relationship well-being.  Sexual activity.  Eating habits.  Work and work Astronomer. Screening  You may have the following tests or measurements:  Height, weight, and BMI.  Blood pressure.  Lipid and cholesterol levels. These may be checked every 5 years, or more frequently if you are over 25 years old.  Skin check.  Lung cancer screening. You may have this screening every year starting at age 6 if you have a 30-pack-year history of smoking and currently smoke or have quit within the past 15 years.  Fecal occult blood test (FOBT) of the stool. You may have this test every year starting at age 72.  Flexible sigmoidoscopy or colonoscopy. You may have a sigmoidoscopy every 5 years or a colonoscopy every 10 years starting at age 78.  Prostate cancer screening. Recommendations will vary depending on your family history and other risks.  Hepatitis C blood test.  Hepatitis B blood test.  Sexually transmitted disease (STD) testing.  Diabetes screening. This is done by checking your blood sugar (glucose) after you have not eaten for a while (fasting). You may have this done every 1-3 years. Discuss your test results, treatment options, and if necessary, the need for more tests with your health care provider. Vaccines  Your health care provider may recommend certain vaccines, such as:  Influenza vaccine. This is recommended every year.  Tetanus, diphtheria, and acellular pertussis (Tdap, Td) vaccine. You may need a Td booster every 10 years.  Zoster vaccine. You may need this after age 108.  Pneumococcal 13-valent conjugate (PCV13) vaccine. You may need this if you have certain conditions and have not been vaccinated.  Pneumococcal polysaccharide (PPSV23) vaccine. You may need one or two doses if you smoke cigarettes or if you have certain conditions. Talk to your health care provider about  which screenings and vaccines you need and how often you need them. This information is not intended to replace advice given to you by your health care provider. Make sure you discuss any questions you have with your health care provider. Document Released: 04/05/2015 Document Revised: 11/27/2015 Document Reviewed: 01/08/2015 Elsevier Interactive Patient Education  2017 [employer].  Fall Prevention in the Home Falls can cause injuries. They can happen to people of all ages. There are many things you can do to make your home safe and to help prevent falls. What can I do on the outside of my home?  Regularly fix the edges of walkways and driveways and fix any cracks.  Remove anything that might make you trip as you walk through a door, such as a raised step or threshold.  Trim any bushes or trees on the path to your home.  Use bright outdoor lighting.  Clear any walking paths of anything that might make someone trip, such as rocks or tools.  Regularly check to see if handrails are loose or broken. Make sure that both sides of any steps have handrails.  Any raised decks and porches should have guardrails on the edges.  Have any leaves, snow, or ice cleared regularly.  Use sand or salt on walking paths during winter.  Clean up any spills in your garage right away. This includes oil or grease spills. What can I do in the bathroom?  Use night lights.  Install grab bars by the toilet and in the tub and shower. Do not use towel bars as grab bars.  Use non-skid mats or decals in the tub or shower.  If you need to sit down in the shower, use a plastic, non-slip stool.  Keep the floor dry. Clean up any water that spills on the floor as soon as it happens.  Remove soap buildup in the tub or shower regularly.  Attach bath mats securely with double-sided non-slip rug tape.  Do not have throw rugs and other things on the floor that can make you trip. What can I do in the  bedroom?  Use night lights.  Make sure that you have a light by your bed that is easy to reach.  Do not use any sheets or blankets that are too big for your bed. They should not hang down onto the floor.  Have a firm chair that has side arms. You can use this for support while you get dressed.  Do not have throw rugs and other things on the floor that can make you trip. What can I do in the kitchen?  Clean up any spills right away.  Avoid walking on wet floors.  Keep items that you use a lot in easy-to-reach places.  If you need to reach something above you, use a strong step stool that has a grab bar.  Keep electrical cords out of the way.  Do not use floor polish or wax that makes floors slippery. If you must use wax, use non-skid floor wax.  Do not have throw rugs and other things on the floor that can make you trip. What can I do with my stairs?  Do not leave any items on the stairs.  Make sure that there are handrails on both sides of the stairs and use them. Fix handrails that  are broken or loose. Make sure that handrails are as long as the stairways.  Check any carpeting to make sure that it is firmly attached to the stairs. Fix any carpet that is loose or worn.  Avoid having throw rugs at the top or bottom of the stairs. If you do have throw rugs, attach them to the floor with carpet tape.  Make sure that you have a light switch at the top of the stairs and the bottom of the stairs. If you do not have them, ask someone to add them for you. What else can I do to help prevent falls?  Wear shoes that:  Do not have high heels.  Have rubber bottoms.  Are comfortable and fit you well.  Are closed at the toe. Do not wear sandals.  If you use a stepladder:  Make sure that it is fully opened. Do not climb a closed stepladder.  Make sure that both sides of the stepladder are locked into place.  Ask someone to hold it for you, if possible.  Clearly mark and make  sure that you can see:  Any grab bars or handrails.  First and last steps.  Where the edge of each step is.  Use tools that help you move around (mobility aids) if they are needed. These include:  Canes.  Walkers.  Scooters.  Crutches.  Turn on the lights when you go into a dark area. Replace any light bulbs as soon as they burn out.  Set up your furniture so you have a clear path. Avoid moving your furniture around.  If any of your floors are uneven, fix them.  If there are any pets around you, be aware of where they are.  Review your medicines with your doctor. Some medicines can make you feel dizzy. This can increase your chance of falling. Ask your doctor what other things that you can do to help prevent falls. This information is not intended to replace advice given to you by your health care provider. Make sure you discuss any questions you have with your health care provider. Document Released: 01/03/2009 Document Revised: 08/15/2015 Document Reviewed: 04/13/2014 Elsevier Interactive Patient Education  2017 [employer].

## 2019-09-21 ENCOUNTER — Other Ambulatory Visit: Payer: Self-pay | Admitting: *Deleted

## 2019-09-21 DIAGNOSIS — F17210 Nicotine dependence, cigarettes, uncomplicated: Secondary | ICD-10-CM

## 2019-09-21 DIAGNOSIS — Z87891 Personal history of nicotine dependence: Secondary | ICD-10-CM

## 2019-09-21 NOTE — Progress Notes (Signed)
hest  

## 2019-09-29 ENCOUNTER — Encounter (HOSPITAL_COMMUNITY): Payer: Self-pay

## 2019-09-29 ENCOUNTER — Ambulatory Visit (HOSPITAL_COMMUNITY)
Admission: EM | Admit: 2019-09-29 | Discharge: 2019-09-29 | Disposition: A | Discharge status: Home / Self Care | Payer: Medicare Other | Attending: Family Medicine | Admitting: Family Medicine

## 2019-09-29 ENCOUNTER — Other Ambulatory Visit: Payer: Self-pay

## 2019-09-29 DIAGNOSIS — L249 Irritant contact dermatitis, unspecified cause: Secondary | ICD-10-CM

## 2019-09-29 MED ORDER — PREDNISONE 10 MG PO TABS: ORAL_TABLET | ORAL | 0 refills | Status: DC

## 2019-09-29 MED ORDER — METHYLPREDNISOLONE SODIUM SUCC 125 MG IJ SOLR
80.0000 mg | Freq: Once | INTRAMUSCULAR | Status: AC
  Administered 2019-09-29: 80 mg via INTRAMUSCULAR

## 2019-09-29 MED ORDER — METHYLPREDNISOLONE SODIUM SUCC 125 MG IJ SOLR
INTRAMUSCULAR | Status: AC
  Filled 2019-09-29: qty 2

## 2019-09-29 MED ORDER — TRIAMCINOLONE ACETONIDE 0.1 % EX CREA: 1.0000 "application " | TOPICAL_CREAM | Freq: Two times a day (BID) | CUTANEOUS | 0 refills | Status: DC

## 2019-09-29 MED ORDER — DOXYCYCLINE HYCLATE 100 MG PO CAPS: 100.0000 mg | ORAL_CAPSULE | Freq: Two times a day (BID) | ORAL | 0 refills | Status: AC

## 2019-09-29 NOTE — ED Triage Notes (Signed)
Pt presents to [facility] for bug bite on right arm x1 day. Pt unsure what type of bug bit him. Upon assessment bite found to be raised, with blister. Pt denies drainage at side.   Of note pt also has poison ivy on bilateral forearms. Pt states symptoms are being controlled by calamine lotion at this time.

## 2019-09-29 NOTE — Discharge Instructions (Signed)
We gave you an injection of Solu-Medrol today, continue with prednisone taper over the next 6 days in the morning-begin with 6 tablets, decrease by 1 tablet each day until complete-6, 5, 4, 3, 2, 1-take with food and in the morning You may also use antihistamines-cetirizine/Zyrtec or loratadine/Claritin in the morning, Benadryl at nighttime May use. triamcinolone topically to areas as well for further itching If area of swelling becoming more red swollen painful or concerning for infection may fill prescription for doxycycline.  Please return for any concerns, persistent or worsening symptoms

## 2019-09-30 NOTE — ED Provider Notes (Signed)
[facility]    CSN: [id number] Arrival date & time: 09/29/19  1754      History   Chief Complaint Chief Complaint  Patient presents with  . Rash  . Insect Bite    HPI Shaun Lang is a 64 y.o. male history of hypertension, hyperlipidemia presenting today for evaluation of possible bat bug bite/poison ivy.  Patient reports that beginning yesterday he began to develop a rash to bilateral forearms.  Has had a specific area of swelling on his right arm which he is concerned about possible bug bite.  Has had significant itching associated with this.  Has been applying calamine lotion.  Denies any difficulty breathing or shortness of breath.  Lesions isolated to the arms.   HPI  Past Medical History:  Diagnosis Date  . Bell's palsy April 2010  . Chest pain with low risk for cardiac etiology    Low Risk Myoview Sept 2013  . DJD (degenerative joint disease)    disabilty secondary to neck issues  . Hyperlipidemia   . Hypertension   . Sleep apnea    does not use CPAP  . Smoker     Patient Active Problem List   Diagnosis Date Noted  . Alcoholic intoxication without complication (HCC) 08/09/2019  . Chronic pain syndrome 09/29/2017  . Lumbosacral spondylosis without myelopathy 09/29/2017  . Obstructive sleep apnea treated with continuous positive airway pressure (CPAP) 06/21/2017  . Inadequate sleep hygiene 02/17/2017  . Snoring 02/17/2017  . Excessive daytime sleepiness 02/17/2017  . Patient takes muscle relaxants 02/17/2017  . Myofascial pain 02/16/2017  . Insomnia 10/14/2016  . Rotator cuff syndrome of left shoulder 01/30/2015  . Arthritis of left acromioclavicular joint 01/30/2015  . Knee pain, bilateral 06/05/2014  . Right rotator cuff tendinitis 07/07/2013  . Chest pain with low risk for cardiac etiology   . Hypertension   . Hyperlipidemia   . DJD (degenerative joint disease)   . Smoker   . Ilioinguinal neuralgia 06/01/2012  . Left inguinal pain  12/23/2011  . Bell's palsy 06/21/2008    Past Surgical History:  Procedure Laterality Date  . BUNIONECTOMY Left 07/15/2017   with big toe fracture repair  . COLONOSCOPY  07/18/2012   polyps-Dr.Outlaw  . HERNIA REPAIR  04/1998   LIH  . NECK SURGERY  07/21/02 and 01/2006  . WRIST SURGERY  1992   right       Home Medications    Prior to Admission medications   Medication Sig Start Date End Date Taking? Authorizing Provider  atorvastatin (LIPITOR) 40 MG tablet Take 1 tablet (40 mg total) by mouth daily at 6 PM. 07/25/19  Yes Nche, Bonna Gains, NP  cyclobenzaprine (FLEXERIL) 10 MG tablet TAKE 1 TABLET BY MOUTH TWICE DAILY AS NEEDED FOR MUSCLE SPASM 06/08/19  Yes Jones Bales, NP  diclofenac (VOLTAREN) 75 MG EC tablet Take 1 tablet (75 mg total) by mouth 2 (two) times daily with a meal. 11/30/17  Yes Ranelle Oyster, MD  gabapentin (NEURONTIN) 300 MG capsule Take 1 capsule (300 mg total) by mouth 2 (two) times daily. 04/12/19  Yes Jones Bales, NP  lisinopril (ZESTRIL) 5 MG tablet Take 1 tablet (5 mg total) by mouth daily. 07/25/19  Yes Nche, Bonna Gains, NP  Multiple Vitamin (MULTIVITAMIN WITH MINERALS) TABS tablet Take 1 tablet by mouth daily.   Yes [provider]  triamterene-hydrochlorothiazide (MAXZIDE-25) 37.5-25 MG tablet Take 1 tablet by mouth daily. 07/25/19  Yes Nche, Bonna Gains,  NP  doxycycline (VIBRAMYCIN) 100 MG capsule Take 1 capsule (100 mg total) by mouth 2 (two) times daily for 10 days. 09/29/19 10/09/19  Dayln Tugwell C, PA-C  predniSONE (DELTASONE) 10 MG tablet Begin with 6 tabs on day 1, 5 tab on day 2, 4 tab on day 3, 3 tab on day 4, 2 tab on day 5, 1 tab on day 6-take with food 09/29/19   Makelle Marrone C, PA-C  triamcinolone cream (KENALOG) 0.1 % Apply 1 application topically 2 (two) times daily. 09/29/19   Lilyonna Steidle, Junius Creamer, PA-C    Family History Family History  Problem Relation Age of Onset  . Diabetes Mother   . Cancer Sister        breast  .  Diabetes Sister   . Stroke Sister 41  . Breast cancer Sister   . Colon cancer Neg Hx   . Esophageal cancer Neg Hx   . Rectal cancer Neg Hx   . Stomach cancer Neg Hx     Social History Social History   Tobacco Use  . Smoking status: Current Every Day Smoker    Packs/day: 1.00    Types: Cigarettes  . Smokeless tobacco: Never Used  Vaping Use  . Vaping Use: Never used  Substance Use Topics  . Alcohol use: Yes    Alcohol/week: 3.0 standard drinks    Types: 3 Shots of liquor per week  . Drug use: No     Allergies   Patient has no known allergies.   Review of Systems Review of Systems  Constitutional: Negative for fatigue and fever.  Eyes: Negative for redness, itching and visual disturbance.  Respiratory: Negative for shortness of breath.   Cardiovascular: Negative for chest pain and leg swelling.  Gastrointestinal: Negative for nausea and vomiting.  Musculoskeletal: Negative for arthralgias and myalgias.  Skin: Positive for color change and rash. Negative for wound.  Neurological: Negative for dizziness, syncope, weakness, light-headedness and headaches.     Physical Exam Triage Vital Signs ED Triage Vitals [09/29/19 1824]  Enc Vitals Group     BP 135/78     Pulse Rate 82     Resp 16     Temp 98.2 F (36.8 C)     Temp Source Oral     SpO2 100 %     Weight      Height      Head Circumference      Peak Flow      Pain Score 0     Pain Loc      Pain Edu?      Excl. in GC?    No data found.  Updated Vital Signs BP 135/78 (BP Location: Right Arm)   Pulse 82   Temp 98.2 F (36.8 C) (Oral)   Resp 16   SpO2 100%   Visual Acuity Right Eye Distance:   Left Eye Distance:   Bilateral Distance:    Right Eye Near:   Left Eye Near:    Bilateral Near:     Physical Exam Vitals and nursing note reviewed.  Constitutional:      Appearance: He is well-developed.     Comments: No acute distress  HENT:     Head: Normocephalic and atraumatic.     Comments:  Face without rash or swelling    Nose: Nose normal.     Mouth/Throat:     Comments: Oral mucosa pink and moist, no tonsillar enlargement or exudate. Posterior pharynx patent and nonerythematous, no  uvula deviation or swelling. Normal phonation.  Eyes:     Conjunctiva/sclera: Conjunctivae normal.  Cardiovascular:     Rate and Rhythm: Normal rate.  Pulmonary:     Effort: Pulmonary effort is normal. No respiratory distress.  Abdominal:     General: There is no distension.  Musculoskeletal:        General: Normal range of motion.     Cervical back: Neck supple.  Skin:    General: Skin is warm and dry.     Comments: Calamine lotion applied to bilateral forearms over rash, does appear maculopapular rash with areas of vesicular linear lesions, isolated area on right forearm with area of swelling, no induration or fluctuance palpated, no warmth  Neurological:     Mental Status: He is alert and oriented to person, place, and time.      UC Treatments / Results  Labs (all labs ordered are listed, but only abnormal results are displayed) Labs Reviewed - No data to display  EKG   Radiology No results found.  Procedures Procedures (including critical care time)  Medications Ordered in UC Medications  methylPREDNISolone sodium succinate (SOLU-MEDROL) 125 mg/2 mL injection 80 mg (80 mg Intramuscular Given 09/29/19 1858)    Initial Impression / Assessment and Plan / UC Course  I have reviewed the triage vital signs and the nursing notes.  Pertinent labs & imaging results that were available during my care of the patient were reviewed by me and considered in my medical decision making (see chart for details).     Patient appears to have contact dermatitis to bilateral forearms.  Recommended treatment with prednisone to help with rash and itching.  Patient unable to obtain prescription today, requesting dose prior to discharge.  Will provide Solu-Medrol 80 mg.  May also continue topical  steroids, antihistamines.  Provided printed prescription for doxycycline to fill if isolated area of swelling becomes more red swollen painful/concerning for infection.  At this time recommended deferring and monitoring area with treatment of steroids/antihistamine alone.  No airway compromise  Discussed strict return precautions. Patient verbalized understanding and is agreeable with plan.  Final Clinical Impressions(s) / UC Diagnoses   Final diagnoses:  Irritant contact dermatitis, unspecified trigger     Discharge Instructions     We gave you an injection of Solu-Medrol today, continue with prednisone taper over the next 6 days in the morning-begin with 6 tablets, decrease by 1 tablet each day until complete-6, 5, 4, 3, 2, 1-take with food and in the morning You may also use antihistamines-cetirizine/Zyrtec or loratadine/Claritin in the morning, Benadryl at nighttime May use. triamcinolone topically to areas as well for further itching If area of swelling becoming more red swollen painful or concerning for infection may fill prescription for doxycycline.  Please return for any concerns, persistent or worsening symptoms   ED Prescriptions    Medication Sig Dispense Auth. Provider   predniSONE (DELTASONE) 10 MG tablet Begin with 6 tabs on day 1, 5 tab on day 2, 4 tab on day 3, 3 tab on day 4, 2 tab on day 5, 1 tab on day 6-take with food 21 tablet Arel Tippen C, PA-C   triamcinolone cream (KENALOG) 0.1 % Apply 1 application topically 2 (two) times daily. 45 g Shandelle Borrelli C, PA-C   doxycycline (VIBRAMYCIN) 100 MG capsule Take 1 capsule (100 mg total) by mouth 2 (two) times daily for 10 days. 20 capsule Falesha Schommer, [place] C, PA-C     PDMP not reviewed this  encounter.   Lew DawesWieters, Nike Southwell C, [place] JerseyPA-C 09/30/19 2119

## 2019-10-10 ENCOUNTER — Encounter: Payer: Self-pay | Admitting: Registered Nurse

## 2019-10-10 ENCOUNTER — Encounter: Payer: Medicare Other | Attending: Physical Medicine & Rehabilitation | Admitting: Registered Nurse

## 2019-10-10 ENCOUNTER — Other Ambulatory Visit: Payer: Self-pay

## 2019-10-10 VITALS — BP 163/84 | HR 64 | Temp 97.6°F | Ht 68.0 in | Wt 199.2 lb

## 2019-10-10 DIAGNOSIS — Z79891 Long term (current) use of opiate analgesic: Secondary | ICD-10-CM | POA: Diagnosis not present

## 2019-10-10 DIAGNOSIS — R1030 Lower abdominal pain, unspecified: Secondary | ICD-10-CM | POA: Diagnosis not present

## 2019-10-10 DIAGNOSIS — M150 Primary generalized (osteo)arthritis: Secondary | ICD-10-CM | POA: Diagnosis not present

## 2019-10-10 DIAGNOSIS — M47817 Spondylosis without myelopathy or radiculopathy, lumbosacral region: Secondary | ICD-10-CM | POA: Diagnosis not present

## 2019-10-10 DIAGNOSIS — M7581 Other shoulder lesions, right shoulder: Secondary | ICD-10-CM | POA: Diagnosis not present

## 2019-10-10 DIAGNOSIS — G5790 Unspecified mononeuropathy of unspecified lower limb: Secondary | ICD-10-CM | POA: Diagnosis not present

## 2019-10-10 DIAGNOSIS — Z5181 Encounter for therapeutic drug level monitoring: Secondary | ICD-10-CM | POA: Insufficient documentation

## 2019-10-10 DIAGNOSIS — G894 Chronic pain syndrome: Secondary | ICD-10-CM | POA: Diagnosis not present

## 2019-10-10 DIAGNOSIS — R1032 Left lower quadrant pain: Secondary | ICD-10-CM | POA: Insufficient documentation

## 2019-10-10 DIAGNOSIS — M7918 Myalgia, other site: Secondary | ICD-10-CM

## 2019-10-10 DIAGNOSIS — M25512 Pain in left shoulder: Secondary | ICD-10-CM | POA: Diagnosis not present

## 2019-10-10 DIAGNOSIS — G8929 Other chronic pain: Secondary | ICD-10-CM

## 2019-10-10 MED ORDER — HYDROCODONE-ACETAMINOPHEN 10-325 MG PO TABS: 1.0000 | ORAL_TABLET | Freq: Two times a day (BID) | ORAL | 0 refills | Status: DC | PRN

## 2019-10-10 NOTE — Progress Notes (Signed)
Subjective:    Patient ID: Shaun Lang, male    DOB: 10-07-1955, 64 y.o.   MRN: [id number]  HPI: Shaun Lang is a 64 y.o. male who returns for follow up appointment for chronic pain and medication refill. He states his pain is located in his left shoulder and lower back pain. He rates his pain 8. His current exercise regime is walking and performing stretching exercises.  Shaun Lang arrived hypertensive, blood pressure was re-checked, Shaun Lang states he forgot to take his anti-hypertensive medication, educated on medication compliaince, he verbalizes understanding.    Shaun Lang Morphine equivalent is 15.00 MME.  Last UDS was Performed on 08/09/2019, it was consistent.   Pain Inventory Average Pain 7 Pain Right Now 8 My pain is sharp  In the last 24 hours, has pain interfered with the following? General activity 7 Relation with others 7 Enjoyment of life 8 What TIME of day is your pain at its worst? morning night Sleep (in general) Poor  Pain is worse with: bending, standing and some activites Pain improves with: medication Relief from Meds: 7  Mobility walk without assistance how many minutes can you walk? 4-5 ability to climb steps?  yes do you drive?  no  Function disabled: date disabled .  Neuro/Psych weakness numbness tremor tingling dizziness  Prior Studies Any changes since last visit?  no  Physicians involved in your care Urgent Care   Family History  Problem Relation Age of Onset  . Diabetes Mother   . Cancer Sister        breast  . Diabetes Sister   . Stroke Sister 62  . Breast cancer Sister   . Colon cancer Neg Hx   . Esophageal cancer Neg Hx   . Rectal cancer Neg Hx   . Stomach cancer Neg Hx    Social History   Socioeconomic History  . Marital status: Single    Spouse name: Not on file  . Number of children: Not on file  . Years of education: Not on file  . Highest education level: Not on file  Occupational History    Comment:  unemployed  Tobacco Use  . Smoking status: Current Every Day Smoker    Packs/day: 1.00    Types: Cigarettes  . Smokeless tobacco: Never Used  Vaping Use  . Vaping Use: Never used  Substance and Sexual Activity  . Alcohol use: Yes    Alcohol/week: 3.0 standard drinks    Types: 3 Shots of liquor per week  . Drug use: No  . Sexual activity: Yes  Other Topics Concern  . Not on file  Social History Narrative  . Not on file   Social Determinants of Health   Financial Resource Strain: Low Risk   . Difficulty of Paying Living Expenses: Not hard at all  Food Insecurity: No Food Insecurity  . Worried About Programme researcher, broadcasting/film/video in the Last Year: Never true  . Ran Out of Food in the Last Year: Never true  Transportation Needs: No Transportation Needs  . Lack of Transportation (Medical): No  . Lack of Transportation (Non-Medical): No  Physical Activity: Insufficiently Active  . Days of Exercise per Week: 3 days  . Minutes of Exercise per Session: 10 min  Stress: No Stress Concern Present  . Feeling of Stress : Not at all  Social Connections: Unknown  . Frequency of Communication with Friends and Family: More than three times a week  . Frequency of Social  Gatherings with Friends and Family: More than three times a week  . Attends Religious Services: Never  . Active Member of Clubs or Organizations: No  . Attends Banker Meetings: Never  . Marital Status: Not on file   Past Surgical History:  Procedure Laterality Date  . BUNIONECTOMY Left 07/15/2017   with big toe fracture repair  . COLONOSCOPY  07/18/2012   polyps-Dr.Outlaw  . HERNIA REPAIR  04/1998   LIH  . NECK SURGERY  07/21/02 and 01/2006  . WRIST SURGERY  1992   right   Past Medical History:  Diagnosis Date  . Bell's palsy April 2010  . Chest pain with low risk for cardiac etiology    Low Risk Myoview Sept 2013  . DJD (degenerative joint disease)    disabilty secondary to neck issues  . Hyperlipidemia     . Hypertension   . Sleep apnea    does not use CPAP  . Smoker    BP (!) 152/101   Pulse 69   Temp 97.6 F (36.4 C)   Ht 5\' 8"  (1.727 m)   Wt 199 lb 3.2 oz (90.4 kg)   SpO2 98%   BMI 30.29 kg/m   Opioid Risk Score:   Fall Risk Score:  `1  Depression screen PHQ 2/9  Depression screen [place] 2/9 10/10/2019 09/14/2019 07/25/2019 08/23/2018 06/22/2018 02/28/2018 11/30/2017  Decreased Interest 0 0 0 0 0 0 0  Down, Depressed, Hopeless 0 0 0 0 0 0 0  PHQ - 2 Score 0 0 0 0 0 0 0  Altered sleeping - - - - - - -  Tired, decreased energy - - - - - - -  Change in appetite - - - - - - -  Feeling bad or failure about yourself  - - - - - - -  Trouble concentrating - - - - - - -  Moving slowly or fidgety/restless - - - - - - -  Suicidal thoughts - - - - - - -  PHQ-9 Score - - - - - - -    Review of Systems  Neurological: Positive for dizziness, tremors, weakness and numbness.       Tingling  All other systems reviewed and are negative.      Objective:   Physical Exam Vitals and nursing note reviewed.  Constitutional:      Appearance: Normal appearance.  Cardiovascular:     Rate and Rhythm: Normal rate and regular rhythm.     Pulses: Normal pulses.     Heart sounds: Normal heart sounds.  Pulmonary:     Effort: Pulmonary effort is normal.     Breath sounds: Normal breath sounds.  Musculoskeletal:     Cervical back: Normal range of motion and neck supple.     Comments: Normal Muscle Bulk and Muscle Testing Reveals:  Upper Extremities: Full ROM and Muscle Strength 5/5 Left AC Joint Tenderness Lumbar Paraspinal Tenderness: L-3-L-5 Lower Extremities: Full ROM and Muscle Strength 5/5 Arises from chair with ease Narrow Based Gait   Skin:    General: Skin is warm and dry.  Neurological:     Mental Status: He is alert and oriented to person, place, and time.  Psychiatric:        Mood and Affect: Mood normal.        Behavior: Behavior normal.           Assessment & Plan:  1.  Chronic left inguinal pain: S/P inguinal  hernia (indirect) and mesh repair. Continue to Monitor.07/19//2021. Refilled: Hydrocodone 10/325 mg one tablet daily as needed, may take and extra tablet when pain is moderate to sever 15 days out of the month.# 45.Second script sent for the following month. We will continue the opioid monitoring program, this consists of regular clinic visits, examinations, urine drug screen, pill counts as well as use of [place] Controlled Substance Reporting system. 2. Lumbosacral Spondylosis: Continue with Exercise regime. Continue to Monitor.10/09/2019 3. Muscle Spasm:Continuecurrent medication regimen withFlexeril. Continue to monitor.10/09/2019 4.Chronic Bilateral Shoulder PainR>LChronic Pain Syndrome:Continue HEP as tolerated.10/09/2019.  of face to face patient care time was spent during this visit. All questions were encouraged and answered.  F/U in 2 months

## 2019-10-18 ENCOUNTER — Ambulatory Visit
Admission: RE | Admit: 2019-10-18 | Discharge: 2019-10-18 | Disposition: A | Discharge status: Home / Self Care | Payer: Medicare Other | Source: Ambulatory Visit | Attending: Acute Care | Admitting: Acute Care

## 2019-10-18 ENCOUNTER — Ambulatory Visit (INDEPENDENT_AMBULATORY_CARE_PROVIDER_SITE_OTHER): Payer: Medicare Other | Admitting: Acute Care

## 2019-10-18 ENCOUNTER — Other Ambulatory Visit: Payer: Self-pay

## 2019-10-18 ENCOUNTER — Encounter: Payer: Self-pay | Admitting: Acute Care

## 2019-10-18 DIAGNOSIS — F17210 Nicotine dependence, cigarettes, uncomplicated: Secondary | ICD-10-CM | POA: Diagnosis not present

## 2019-10-18 DIAGNOSIS — Z122 Encounter for screening for malignant neoplasm of respiratory organs: Secondary | ICD-10-CM | POA: Insufficient documentation

## 2019-10-18 DIAGNOSIS — Z87891 Personal history of nicotine dependence: Secondary | ICD-10-CM

## 2019-10-18 NOTE — Progress Notes (Signed)
Shared Decision Making Visit Lung Cancer Screening Program [phone])   Eligibility:  Age 64 y.o.  Pack Years Smoking History Calculation 33 pack year smoking history (# packs/per year x # years smoked)  Recent History of coughing up blood  no  Unexplained weight loss? no ( >Than 15 pounds within the last 6 months )  Prior History Lung / other cancer no (Diagnosis within the last 5 years already requiring surveillance chest CT Scans).  Smoking Status Current Smoker  Former Smokers: Years since quit: NA  Quit Date: NA  Visit Components:  Discussion included one or more decision making aids. yes  Discussion included risk/benefits of screening. yes  Discussion included potential follow up diagnostic testing for abnormal scans. yes  Discussion included meaning and risk of over diagnosis. yes  Discussion included meaning and risk of False Positives. yes  Discussion included meaning of total radiation exposure. yes  Counseling Included:  Importance of adherence to annual lung cancer LDCT screening. yes  Impact of comorbidities on ability to participate in the program. yes  Ability and willingness to under diagnostic treatment. yes  Smoking Cessation Counseling:  Current Smokers:   Discussed importance of smoking cessation. yes  Information about tobacco cessation classes and interventions provided to patient. yes  Patient provided with "ticket" for LDCT Scan. yes  Symptomatic Patient. no  Counseling NA  Diagnosis Code: Tobacco Use Z72.0  Asymptomatic Patient yes  Counseling (Intermediate counseling: > three minutes counseling) I3382  Former Smokers:   Discussed the importance of maintaining cigarette abstinence. yes  Diagnosis Code: Personal History of Nicotine Dependence. N05.397  Information about tobacco cessation classes and interventions provided to patient. Yes  Patient provided with "ticket" for LDCT Scan. yes  Written Order for Lung Cancer  Screening with LDCT placed in Epic. Yes (CT Chest Lung Cancer Screening Low Dose W/O CM) [id number] Z12.2-Screening of respiratory organs Z87.891-Personal history of nicotine dependence  I have spent 25 minutes of face to face time with Shaun Lang discussing the risks and benefits of lung cancer screening. We viewed a power point together that explained in detail the above noted topics. We paused at intervals to allow for questions to be asked and answered to ensure understanding.We discussed that the single most powerful action that he can take to decrease his risk of developing lung cancer is to quit smoking. We discussed whether or not he is ready to commit to setting a quit date. We discussed options for tools to aid in quitting smoking including nicotine replacement therapy, non-nicotine medications, support groups, Quit Smart classes, and behavior modification. We discussed that often times setting smaller, more achievable goals, such as eliminating 1 cigarette a day for a week and then 2 cigarettes a day for a week can be helpful in slowly decreasing the number of cigarettes smoked. This allows for a sense of accomplishment as well as providing a clinical benefit. I gave him the " Be Stronger Than Your Excuses" card with contact information for community resources, classes, free nicotine replacement therapy, and access to mobile apps, text messaging, and on-line smoking cessation help. I have also given him my card and contact information in the event he needs to contact me. We discussed the time and location of the scan, and that either Shaun Miyamoto RN or I will call with the results within 24-48 hours of receiving them. I have offered him  a copy of the power point we viewed  as a resource in the event they need reinforcement  of the concepts we discussed today in the office. The patient verbalized understanding of all of  the above and had no further questions upon leaving the office. They have my contact  information in the event they have any further questions.  I spent 3 minutes counseling on smoking cessation and the health risks of continued tobacco abuse.  I explained to the patient that there has been a high incidence of coronary artery disease noted on these exams. I explained that this is a non-gated exam therefore degree or severity cannot be determined. This patient is  Currently on statin therapy. I have asked the patient to follow-up with their PCP regarding any incidental finding of coronary artery disease and management with diet or medication as their PCP  feels is clinically indicated. The patient verbalized understanding of the above and had no further questions upon completion of the visit.   Pt. Is currently using nicotine patched to see if he can quit smoking.  I have offered an OV with Pharmacy , and told him to call us if he would like to pursue this option.    Bevelyn Ngo, NP 10/18/2019

## 2019-10-18 NOTE — Patient Instructions (Signed)
Thank you for participating in the [facility]. It was our pleasure to meet you today. We will call you with the results of your scan within the next few days. Your scan will be assigned a Lung RADS category score by the physicians reading the scans.  This Lung RADS score determines follow up scanning.  See below for description of categories, and follow up screening recommendations. We will be in touch to schedule your follow up screening annually or based on recommendations of our providers. We will fax a copy of your scan results to your Primary Care Physician, or the physician who referred you to the program, to ensure they have the results. Please call the office if you have any questions or concerns regarding your scanning experience or results.  Our office number is [phone]. Please speak with Denise Phelps, RN. She is our Lung Cancer Screening RN. If she is unavailable when you call, please have the office staff send her a message. She will return your call at her earliest convenience. Remember, if your scan is normal, we will scan you annually as long as you continue to meet the criteria for the program. (Age 55-77, Current smoker or smoker who has quit within the last 15 years). If you are a smoker, remember, quitting is the single most powerful action that you can take to decrease your risk of lung cancer and other pulmonary, breathing related problems. We know quitting is hard, and we are here to help.  Please let us know if there is anything we can do to help you meet your goal of quitting. If you are a former smoker, congratulations. We are proud of you! Remain smoke free! Remember you can refer friends or family members through the number above.  We will screen them to make sure they meet criteria for the program. Thank you for helping us take better care of you by participating in Lung Screening.  Lung RADS Categories:  Lung RADS 1: no nodules  or definitely non-concerning nodules.  Recommendation is for a repeat annual scan in 12 months.  Lung RADS 2:  nodules that are non-concerning in appearance and behavior with a very low likelihood of becoming an active cancer. Recommendation is for a repeat annual scan in 12 months.  Lung RADS 3: nodules that are probably non-concerning , includes nodules with a low likelihood of becoming an active cancer.  Recommendation is for a 6-month repeat screening scan. Often noted after an upper respiratory illness. We will be in touch to make sure you have no questions, and to schedule your 6-month scan.  Lung RADS 4 A: nodules with concerning findings, recommendation is most often for a follow up scan in 3 months or additional testing based on our provider's assessment of the scan. We will be in touch to make sure you have no questions and to schedule the recommended 3 month follow up scan.  Lung RADS 4 B:  indicates findings that are concerning. We will be in touch with you to schedule additional diagnostic testing based on our provider's  assessment of the scan.   

## 2019-10-19 NOTE — Progress Notes (Signed)
Please call patient and let them  know their  low dose Ct was read as a Lung RADS 2: nodules that are benign in appearance and behavior with a very low likelihood of becoming a clinically active cancer due to size or lack of growth. Recommendation per radiology is for a repeat LDCT in 12 months. .Please let them  know we will order and schedule their  annual screening scan for 09/2020. Please let them  know there was notation of CAD on their  scan.  Please remind the patient  that this is a non-gated exam therefore degree or severity of disease  cannot be determined. Please have them  follow up with their PCP regarding potential risk factor modification, dietary therapy or pharmacologic therapy if clinically indicated. Pt.  is  currently on statin therapy. Please place order for annual  screening scan for  09/2020 and fax results to PCP. Thanks so much. 

## 2019-11-02 ENCOUNTER — Other Ambulatory Visit: Payer: Self-pay | Admitting: *Deleted

## 2019-11-02 ENCOUNTER — Encounter: Payer: Self-pay | Admitting: *Deleted

## 2019-11-02 DIAGNOSIS — F17210 Nicotine dependence, cigarettes, uncomplicated: Secondary | ICD-10-CM

## 2019-12-06 ENCOUNTER — Encounter: Payer: Medicare Other | Admitting: Registered Nurse

## 2019-12-07 ENCOUNTER — Encounter: Payer: Medicare Other | Admitting: Registered Nurse

## 2019-12-11 ENCOUNTER — Encounter: Payer: Medicare Other | Attending: Physical Medicine & Rehabilitation | Admitting: Registered Nurse

## 2019-12-11 ENCOUNTER — Encounter: Payer: Self-pay | Admitting: Registered Nurse

## 2019-12-11 ENCOUNTER — Other Ambulatory Visit: Payer: Self-pay

## 2019-12-11 VITALS — BP 143/89 | HR 69 | Temp 98.9°F | Ht 68.0 in | Wt 194.8 lb

## 2019-12-11 DIAGNOSIS — M7581 Other shoulder lesions, right shoulder: Secondary | ICD-10-CM | POA: Diagnosis not present

## 2019-12-11 DIAGNOSIS — M47817 Spondylosis without myelopathy or radiculopathy, lumbosacral region: Secondary | ICD-10-CM

## 2019-12-11 DIAGNOSIS — G5790 Unspecified mononeuropathy of unspecified lower limb: Secondary | ICD-10-CM | POA: Insufficient documentation

## 2019-12-11 DIAGNOSIS — M7918 Myalgia, other site: Secondary | ICD-10-CM

## 2019-12-11 DIAGNOSIS — G894 Chronic pain syndrome: Secondary | ICD-10-CM | POA: Diagnosis not present

## 2019-12-11 DIAGNOSIS — Z5181 Encounter for therapeutic drug level monitoring: Secondary | ICD-10-CM | POA: Diagnosis not present

## 2019-12-11 DIAGNOSIS — M150 Primary generalized (osteo)arthritis: Secondary | ICD-10-CM | POA: Insufficient documentation

## 2019-12-11 DIAGNOSIS — R1030 Lower abdominal pain, unspecified: Secondary | ICD-10-CM | POA: Insufficient documentation

## 2019-12-11 DIAGNOSIS — Z79891 Long term (current) use of opiate analgesic: Secondary | ICD-10-CM | POA: Diagnosis not present

## 2019-12-11 DIAGNOSIS — M25512 Pain in left shoulder: Secondary | ICD-10-CM

## 2019-12-11 DIAGNOSIS — R1032 Left lower quadrant pain: Secondary | ICD-10-CM | POA: Diagnosis not present

## 2019-12-11 DIAGNOSIS — M542 Cervicalgia: Secondary | ICD-10-CM

## 2019-12-11 DIAGNOSIS — G8929 Other chronic pain: Secondary | ICD-10-CM

## 2019-12-11 DIAGNOSIS — M5412 Radiculopathy, cervical region: Secondary | ICD-10-CM

## 2019-12-11 MED ORDER — HYDROCODONE-ACETAMINOPHEN 10-325 MG PO TABS: 1.0000 | ORAL_TABLET | Freq: Two times a day (BID) | ORAL | 0 refills | Status: DC | PRN

## 2019-12-11 NOTE — Progress Notes (Signed)
Subjective:    Patient ID: Shaun Lang, male    DOB: 04/05/1955, 64 y.o.   MRN: [id number]  HPI: Shaun Lang is a 64 y.o. male who returns for follow up appointment for chronic pain and medication refill. He states his pain is located in his neck radiating into his left shoulder and lower back pain. He rates his  Pain 6. His  current exercise regime is walking and performing stretching exercises.  Mr. Yonker also reports over the last year he has noticed at times his left lower extremity feels cool to palpation. Today his skin is warm and dry and  Left pedal pulse +. He was instructed to F/U with his PCP he verbalizes understanding.   Mr. Linford Morphine equivalent is 19.57 MME.  Last UDS was Performed on 08/09/2019, it was consistent.   Pain Inventory Average Pain 8 Pain Right Now 6 My pain is intermittent, sharp, burning, tingling and aching  In the last 24 hours, has pain interfered with the following? General activity 6 Relation with others 9 Enjoyment of life 9 What TIME of day is your pain at its worst? morning , evening and night Sleep (in general) Poor  Pain is worse with: bending, inactivity, standing and some activites Pain improves with: rest, pacing activities and medication Relief from Meds: 7  Family History  Problem Relation Age of Onset  . Diabetes Mother   . Cancer Sister        breast  . Diabetes Sister   . Stroke Sister 89  . Breast cancer Sister   . Colon cancer Neg Hx   . Esophageal cancer Neg Hx   . Rectal cancer Neg Hx   . Stomach cancer Neg Hx    Social History   Socioeconomic History  . Marital status: Single    Spouse name: Not on file  . Number of children: Not on file  . Years of education: Not on file  . Highest education level: Not on file  Occupational History    Comment: unemployed  Tobacco Use  . Smoking status: Current Every Day Smoker    Packs/day: 1.00    Years: 33.00    Pack years: 33.00    Types: Cigarettes  .  Smokeless tobacco: Never Used  Vaping Use  . Vaping Use: Never used  Substance and Sexual Activity  . Alcohol use: Yes    Alcohol/week: 3.0 standard drinks    Types: 3 Shots of liquor per week  . Drug use: No  . Sexual activity: Yes  Other Topics Concern  . Not on file  Social History Narrative  . Not on file   Social Determinants of Health   Financial Resource Strain: Low Risk   . Difficulty of Paying Living Expenses: Not hard at all  Food Insecurity: No Food Insecurity  . Worried About Programme researcher, broadcasting/film/video in the Last Year: Never true  . Ran Out of Food in the Last Year: Never true  Transportation Needs: No Transportation Needs  . Lack of Transportation (Medical): No  . Lack of Transportation (Non-Medical): No  Physical Activity: Insufficiently Active  . Days of Exercise per Week: 3 days  . Minutes of Exercise per Session: 10 min  Stress: No Stress Concern Present  . Feeling of Stress : Not at all  Social Connections: Unknown  . Frequency of Communication with Friends and Family: More than three times a week  . Frequency of Social Gatherings with Friends and Family: More  than three times a week  . Attends Religious Services: Never  . Active Member of Clubs or Organizations: No  . Attends Banker Meetings: Never  . Marital Status: Not on file   Past Surgical History:  Procedure Laterality Date  . BUNIONECTOMY Left 07/15/2017   with big toe fracture repair  . COLONOSCOPY  07/18/2012   polyps-Dr.Outlaw  . HERNIA REPAIR  04/1998   [facility]  . NECK SURGERY  07/21/02 and 01/2006  . WRIST SURGERY  1992   right   Past Surgical History:  Procedure Laterality Date  . BUNIONECTOMY Left 07/15/2017   with big toe fracture repair  . COLONOSCOPY  07/18/2012   polyps-Dr.Outlaw  . HERNIA REPAIR  04/1998   [facility]  . NECK SURGERY  07/21/02 and 01/2006  . WRIST SURGERY  1992   right   Past Medical History:  Diagnosis Date  . Bell's palsy April 2010  . Chest pain with low  risk for cardiac etiology    Low Risk Myoview Sept 2013  . DJD (degenerative joint disease)    disabilty secondary to neck issues  . Hyperlipidemia   . Hypertension   . Sleep apnea    does not use CPAP  . Smoker    BP (!) 143/89   Pulse 69   Temp 98.9 F (37.2 C)   Ht 5\' 8"  (1.727 m)   Wt 194 lb 12.8 oz (88.4 kg)   SpO2 98%   BMI 29.62 kg/m   Opioid Risk Score:   Fall Risk Score:  `1  Depression screen PHQ 2/9  Depression screen [facility] 2/9 12/11/2019 10/10/2019 09/14/2019 07/25/2019 08/23/2018 06/22/2018 02/28/2018  Decreased Interest 0 0 0 0 0 0 0  Down, Depressed, Hopeless 0 0 0 0 0 0 0  PHQ - 2 Score 0 0 0 0 0 0 0  Altered sleeping - - - - - - -  Tired, decreased energy - - - - - - -  Change in appetite - - - - - - -  Feeling bad or failure about yourself  - - - - - - -  Trouble concentrating - - - - - - -  Moving slowly or fidgety/restless - - - - - - -  Suicidal thoughts - - - - - - -  PHQ-9 Score - - - - - - -    Review of Systems  Musculoskeletal: Positive for back pain and neck pain. Negative for myalgias.  Neurological: Positive for weakness and numbness.       Tingling  All other systems reviewed and are negative.      Objective:   Physical Exam Vitals and nursing note reviewed.  Constitutional:      Appearance: Normal appearance.  Neck:     Comments: Cervical Paraspinal Tenderness: C-5-C-6 Cardiovascular:     Rate and Rhythm: Normal rate and regular rhythm.  Musculoskeletal:     Cervical back: Normal range of motion and neck supple.     Comments: Normal Muscle Bulk and Muscle Testing Reveals:  Upper Extremities: Full ROM and Muscle Strength 5/5 Left AC Joint Tenderness Lumbar Paraspinal Tenderness: L-4- L-5 Lower Extremities: Full ROM and Muscle Strength 5/5 Arises from chair with ease Narrow Based  Gait   Skin:    General: Skin is warm and dry.  Neurological:     Mental Status: He is alert and oriented to person, place, and time.  Psychiatric:         Mood and  Affect: Mood normal.        Behavior: Behavior normal.           Assessment & Plan:  1. Chronic left inguinal pain: S/P inguinal hernia (indirect) and mesh repair. Continue to Monitor.09/20//2021. Refilled: Hydrocodone 10/325 mg one tablet daily as needed, may take and extra tablet when pain is moderate to sever 15 days out of the month.# 45.Second script sent for the following month. We will continue the opioid monitoring program, this consists of regular clinic visits, examinations, urine drug screen, pill counts as well as use of [place] Controlled Substance Reporting system. A 12 month History has been reviewed on the [place] Controlled Substance Reporting System on 12/11/2019.  2. Lumbosacral Spondylosis: Continue with Exercise regime. Continue to Monitor.12/10/2019 3. Muscle Spasm:Continuecurrent medication regimen withFlexeril. Continue to monitor.12/11/2019 4.Chronic Bilateral Shoulder PainR>LChronic Pain Syndrome:Continue HEP as tolerated.12/11/2019. 5. Cervicalgia/ Cervical Radiculitis: Continue HEP as Tolerated. Alternate heat and Ice Therapy as tolerated. Continue current medication regimen. Continue to Monitor.   of face to face patient care time was spent during this visit. All questions were encouraged and answered.  F/U in 2 months

## 2020-01-29 ENCOUNTER — Other Ambulatory Visit: Payer: Self-pay

## 2020-01-30 ENCOUNTER — Ambulatory Visit (INDEPENDENT_AMBULATORY_CARE_PROVIDER_SITE_OTHER): Payer: Medicare Other | Admitting: Nurse Practitioner

## 2020-01-30 ENCOUNTER — Encounter: Payer: Self-pay | Admitting: Nurse Practitioner

## 2020-01-30 VITALS — BP 130/78 | HR 76 | Temp 97.5°F | Ht 68.0 in | Wt 197.0 lb

## 2020-01-30 DIAGNOSIS — G44019 Episodic cluster headache, not intractable: Secondary | ICD-10-CM

## 2020-01-30 DIAGNOSIS — N529 Male erectile dysfunction, unspecified: Secondary | ICD-10-CM

## 2020-01-30 DIAGNOSIS — E782 Mixed hyperlipidemia: Secondary | ICD-10-CM

## 2020-01-30 DIAGNOSIS — I10 Essential (primary) hypertension: Secondary | ICD-10-CM

## 2020-01-30 LAB — BASIC METABOLIC PANEL
BUN: 14 mg/dL (ref 6–23)
CO2: 26 mEq/L (ref 19–32)
Calcium: 8.8 mg/dL (ref 8.4–10.5)
Chloride: 105 mEq/L (ref 96–112)
Creatinine, Ser: 1.06 mg/dL (ref 0.40–1.50)
GFR: 74.07 mL/min (ref >60.00)
Glucose, Bld: 117 mg/dL — ABNORMAL HIGH (ref 70–99)
Potassium: 4 mEq/L (ref 3.5–5.1)
Sodium: 138 mEq/L (ref 135–145)

## 2020-01-30 LAB — CK: Total CK: 142 U/L (ref 7–232)

## 2020-01-30 LAB — HEPATIC FUNCTION PANEL
ALT: 22 U/L (ref 0–53)
AST: 18 U/L (ref 0–37)
Albumin: 4.2 g/dL (ref 3.5–5.2)
Alkaline Phosphatase: 56 U/L (ref 39–117)
Bilirubin, Direct: 0.1 mg/dL (ref 0.0–0.3)
Total Bilirubin: 0.4 mg/dL (ref 0.2–1.2)
Total Protein: 6.5 g/dL (ref 6.0–8.3)

## 2020-01-30 LAB — SEDIMENTATION RATE: Sed Rate: 14 mm/hr (ref 0–20)

## 2020-01-30 MED ORDER — SILDENAFIL CITRATE 50 MG PO TABS: 50.0000 mg | ORAL_TABLET | Freq: Every day | ORAL | 0 refills | Status: DC | PRN

## 2020-01-30 NOTE — Assessment & Plan Note (Signed)
BP at goal with lisinopril BP Readings from Last 3 Encounters:  01/30/20 130/78  12/11/19 (!) 143/89  10/10/19 (!) 163/84   Repeat BMP Maintain current medication

## 2020-01-30 NOTE — Patient Instructions (Signed)
Go to lab for blood draw.

## 2020-01-30 NOTE — Progress Notes (Signed)
Subjective:  Patient ID: Shaun Lang, male    DOB: 09/28/1955  Age: 64 y.o. MRN: [id number]  CC: Follow-up (6 month f/u on HTN and cholesterol. Pt is not fasting. Declines flu vaccine.)  Headache  This is a recurrent problem. The current episode started more than 1 month ago. The problem occurs intermittently. The problem has been waxing and waning. The pain is located in the right unilateral region. The pain does not radiate. The pain quality is similar to prior headaches. The quality of the pain is described as aching and pulsating. The pain is moderate. Pertinent negatives include no blurred vision, dizziness, drainage, ear pain, eye pain, facial sweating, fever, loss of balance, muscle aches, numbness, phonophobia, photophobia, rhinorrhea, scalp tenderness, sinus pressure, tingling, tinnitus, visual change or weakness. The symptoms are aggravated by unknown. He has tried acetaminophen (muscle relaxant) for the symptoms. The treatment provided significant relief. His past medical history is significant for hypertension. There is no history of migraine headaches, obesity, recent head traumas or sinus disease.   Hypertension BP at goal with lisinopril BP Readings from Last 3 Encounters:  01/30/20 130/78  12/11/19 (!) 143/89  10/10/19 (!) 163/84   Repeat BMP Maintain current medication  He is requesting Viagra rx due to ED. Previous use with no adverse side effects, no hx of BPH or dysuria.  Reviewed past Medical, Social and Family history today.  Outpatient Medications Prior to Visit  Medication Sig Dispense Refill  . atorvastatin (LIPITOR) 40 MG tablet Take 1 tablet (40 mg total) by mouth daily at 6 PM. 90 tablet 3  . cyclobenzaprine (FLEXERIL) 10 MG tablet TAKE 1 TABLET BY MOUTH TWICE DAILY AS NEEDED FOR MUSCLE SPASM 60 tablet 2  . diclofenac (VOLTAREN) 75 MG EC tablet Take 1 tablet (75 mg total) by mouth 2 (two) times daily with a meal. 60 tablet 3  . gabapentin (NEURONTIN) 300 MG  capsule Take 1 capsule (300 mg total) by mouth 2 (two) times daily. 60 capsule 2  . HYDROcodone-acetaminophen (NORCO) 10-325 MG tablet Take 1 tablet by mouth 2 (two) times daily as needed. 45 tablet 0  . lisinopril (ZESTRIL) 5 MG tablet Take 1 tablet (5 mg total) by mouth daily. 90 tablet 3  . Multiple Vitamin (MULTIVITAMIN WITH MINERALS) TABS tablet Take 1 tablet by mouth daily.    [name] triamterene-hydrochlorothiazide (MAXZIDE-25) 37.5-25 MG tablet Take 1 tablet by mouth daily. 90 tablet 1  . predniSONE (DELTASONE) 10 MG tablet Begin with 6 tabs on day 1, 5 tab on day 2, 4 tab on day 3, 3 tab on day 4, 2 tab on day 5, 1 tab on day 6-take with food (Patient not taking: Reported on 01/30/2020) 21 tablet 0  . triamcinolone cream (KENALOG) 0.1 % Apply 1 application topically 2 (two) times daily. (Patient not taking: Reported on 01/30/2020) 45 g 0   No facility-administered medications prior to visit.    ROS See HPI  Objective:  BP 130/78 (BP Location: Left Arm, Patient Position: Sitting, Cuff Size: Large)   Pulse 76   Temp (!) 97.5 F (36.4 C) (Temporal)   Ht 5\' 8"  (1.727 m)   Wt 197 lb (89.4 kg)   SpO2 98%   BMI 29.95 kg/m   Physical Exam Vitals reviewed.  HENT:     Right Ear: Hearing and external ear normal. No tenderness. No mastoid tenderness.     Left Ear: Hearing and external ear normal. No tenderness. No mastoid tenderness.  Eyes:  Extraocular Movements: Extraocular movements intact.     Conjunctiva/sclera: Conjunctivae normal.     Pupils: Pupils are equal, round, and reactive to light.  Cardiovascular:     Rate and Rhythm: Normal rate and regular rhythm.     Pulses: Normal pulses.     Heart sounds: Normal heart sounds.  Pulmonary:     Effort: Pulmonary effort is normal.     Breath sounds: Normal breath sounds.  Musculoskeletal:     Cervical back: Normal range of motion and neck supple.  Lymphadenopathy:     Cervical: No cervical adenopathy.  Skin:    Findings: No  erythema or rash.  Neurological:     Mental Status: He is alert and oriented to person, place, and time.     Comments: Right facial asymmetry (chronic, secondary to bell's palsy)  Psychiatric:        Mood and Affect: Mood normal.        Behavior: Behavior normal.        Thought Content: Thought content normal.    Assessment & Plan:  This visit occurred during the SARS-CoV-2 public health emergency.  Safety protocols were in place, including screening questions prior to the visit, additional usage of staff PPE, and extensive cleaning of exam room while observing appropriate contact time as indicated for disinfecting solutions.   Shaun Lang was seen today for follow-up.  Diagnoses and all orders for this visit:  Primary hypertension -     Basic metabolic panel  Mixed hyperlipidemia -     Hepatic function panel  Erectile dysfunction, unspecified erectile dysfunction type -     sildenafil (VIAGRA) 50 MG tablet; Take 1 tablet (50 mg total) by mouth daily as needed for erectile dysfunction.  Episodic cluster headache, not intractable -     CK (Creatine Kinase) -     Sedimentation rate  use tylenol or excedrin migraine for headche. Advised about possible side effects with viagra and when to seek emergent care.  Problem List Items Addressed This Visit      Cardiovascular and Mediastinum   Hypertension - Primary    BP at goal with lisinopril BP Readings from Last 3 Encounters:  01/30/20 130/78  12/11/19 (!) 143/89  10/10/19 (!) 163/84   Repeat BMP Maintain current medication      Relevant Medications   sildenafil (VIAGRA) 50 MG tablet   Other Relevant Orders   Basic metabolic panel     Other   Hyperlipidemia   Relevant Medications   sildenafil (VIAGRA) 50 MG tablet   Other Relevant Orders   Hepatic function panel    Other Visit Diagnoses    Erectile dysfunction, unspecified erectile dysfunction type       Relevant Medications   sildenafil (VIAGRA) 50 MG tablet    Episodic cluster headache, not intractable       Relevant Orders   CK (Creatine Kinase)   Sedimentation rate      Follow-up: Return in about 6 months (around 07/29/2020) for HTn and , hyperlipidemia.  Alysia Penna, NP

## 2020-02-02 MED ORDER — TRIAMTERENE-HCTZ 37.5-25 MG PO TABS: 1.0000 | ORAL_TABLET | Freq: Every day | ORAL | 1 refills | Status: DC

## 2020-02-08 ENCOUNTER — Other Ambulatory Visit: Payer: Self-pay

## 2020-02-08 ENCOUNTER — Encounter: Payer: Medicare Other | Attending: Physical Medicine & Rehabilitation | Admitting: Registered Nurse

## 2020-02-08 ENCOUNTER — Encounter: Payer: Self-pay | Admitting: Registered Nurse

## 2020-02-08 VITALS — BP 131/85 | HR 68 | Temp 98.5°F | Ht 68.0 in | Wt 197.0 lb

## 2020-02-08 DIAGNOSIS — Z79891 Long term (current) use of opiate analgesic: Secondary | ICD-10-CM

## 2020-02-08 DIAGNOSIS — M47817 Spondylosis without myelopathy or radiculopathy, lumbosacral region: Secondary | ICD-10-CM | POA: Insufficient documentation

## 2020-02-08 DIAGNOSIS — M542 Cervicalgia: Secondary | ICD-10-CM | POA: Diagnosis not present

## 2020-02-08 DIAGNOSIS — G894 Chronic pain syndrome: Secondary | ICD-10-CM | POA: Diagnosis not present

## 2020-02-08 DIAGNOSIS — M7918 Myalgia, other site: Secondary | ICD-10-CM | POA: Insufficient documentation

## 2020-02-08 DIAGNOSIS — Z5181 Encounter for therapeutic drug level monitoring: Secondary | ICD-10-CM | POA: Diagnosis not present

## 2020-02-08 MED ORDER — HYDROCODONE-ACETAMINOPHEN 10-325 MG PO TABS: 1.0000 | ORAL_TABLET | Freq: Two times a day (BID) | ORAL | 0 refills | Status: DC | PRN

## 2020-02-08 NOTE — Progress Notes (Signed)
Subjective:    Patient ID: Shaun Lang, male    DOB: [birth year], 64 y.o.   MRN: [id number]  HPI: Shaun Lang is a 64 y.o. male who returns for follow up appointment for chronic pain and medication refill. He states his pain is located in his neck and lower back pain mainly right side. He rates his pain 6. His current exercise regime is walking and performing stretching exercises.  Shaun Lang Morphine equivalent is 19.57 MME.  UDS ordered today.    Pain Inventory Average Pain 7 Pain Right Now 6 My pain is stabbing, tingling and aching  In the last 24 hours, has pain interfered with the following? General activity 7 Relation with others 9 Enjoyment of life 8 What TIME of day is your pain at its worst? morning  and evening Sleep (in general) Fair  Pain is worse with: sitting, inactivity and some activites Pain improves with: rest and medication Relief from Meds: 7  Family History  Problem Relation Age of Onset  . Diabetes Mother   . Cancer Sister        breast  . Diabetes Sister   . Stroke Sister 32  . Breast cancer Sister   . Colon cancer Neg Hx   . Esophageal cancer Neg Hx   . Rectal cancer Neg Hx   . Stomach cancer Neg Hx    Social History   Socioeconomic History  . Marital status: Single    Spouse name: Not on file  . Number of children: Not on file  . Years of education: Not on file  . Highest education level: Not on file  Occupational History    Comment: unemployed  Tobacco Use  . Smoking status: Current Every Day Smoker    Packs/day: 1.00    Years: 33.00    Pack years: 33.00    Types: Cigarettes  . Smokeless tobacco: Never Used  Vaping Use  . Vaping Use: Never used  Substance and Sexual Activity  . Alcohol use: Yes    Alcohol/week: 3.0 standard drinks    Types: 3 Shots of liquor per week  . Drug use: No  . Sexual activity: Yes  Other Topics Concern  . Not on file  Social History Narrative  . Not on file   Social Determinants of Health    Financial Resource Strain: Low Risk   . Difficulty of Paying Living Expenses: Not hard at all  Food Insecurity: No Food Insecurity  . Worried About Programme researcher, broadcasting/film/video in the Last Year: Never true  . Ran Out of Food in the Last Year: Never true  Transportation Needs: No Transportation Needs  . Lack of Transportation (Medical): No  . Lack of Transportation (Non-Medical): No  Physical Activity: Insufficiently Active  . Days of Exercise per Week: 3 days  . Minutes of Exercise per Session: 10 min  Stress: No Stress Concern Present  . Feeling of Stress : Not at all  Social Connections: Unknown  . Frequency of Communication with Friends and Family: More than three times a week  . Frequency of Social Gatherings with Friends and Family: More than three times a week  . Attends Religious Services: Never  . Active Member of Clubs or Organizations: No  . Attends Banker Meetings: Never  . Marital Status: Not on file   Past Surgical History:  Procedure Laterality Date  . BUNIONECTOMY Left 07/15/2017   with big toe fracture repair  . COLONOSCOPY  07/18/2012  polyps-Dr.Outlaw  . HERNIA REPAIR  04/1998   [facility]  . NECK SURGERY  07/21/02 and 01/2006  . WRIST SURGERY  1992   right   Past Surgical History:  Procedure Laterality Date  . BUNIONECTOMY Left 07/15/2017   with big toe fracture repair  . COLONOSCOPY  07/18/2012   polyps-Dr.Outlaw  . HERNIA REPAIR  04/1998   LIH  . NECK SURGERY  07/21/02 and 01/2006  . WRIST SURGERY  1992   right   Past Medical History:  Diagnosis Date  . Bell's palsy April 2010  . Chest pain with low risk for cardiac etiology    Low Risk Myoview Sept 2013  . DJD (degenerative joint disease)    disabilty secondary to neck issues  . Hyperlipidemia   . Hypertension   . Sleep apnea    does not use CPAP  . Smoker    BP (!) 150/91   Pulse 70   Temp 98.5 F (36.9 C)   Ht 5\' 8"  (1.727 m)   Wt 197 lb (89.4 kg)   SpO2 97%   BMI 29.95 kg/m    Opioid Risk Score:   Fall Risk Score:  `1  Depression screen PHQ 2/9  Depression screen [facility] 2/9 12/11/2019 10/10/2019 09/14/2019 07/25/2019 08/23/2018 06/22/2018 02/28/2018  Decreased Interest 0 0 0 0 0 0 0  Down, Depressed, Hopeless 0 0 0 0 0 0 0  PHQ - 2 Score 0 0 0 0 0 0 0  Altered sleeping - - - - - - -  Tired, decreased energy - - - - - - -  Change in appetite - - - - - - -  Feeling bad or failure about yourself  - - - - - - -  Trouble concentrating - - - - - - -  Moving slowly or fidgety/restless - - - - - - -  Suicidal thoughts - - - - - - -  PHQ-9 Score - - - - - - -    Review of Systems  Constitutional: Negative.   HENT: Negative.   Eyes: Negative.   Respiratory: Negative.   Cardiovascular: Negative.   Gastrointestinal: Negative.   Endocrine: Negative.   Genitourinary: Negative.   Musculoskeletal: Positive for back pain and neck pain.  Skin: Negative.   Allergic/Immunologic: Negative.   Neurological: Positive for numbness.       Tingling   Hematological: Negative.   Psychiatric/Behavioral: Negative.   All other systems reviewed and are negative.      Objective:   Physical Exam Vitals and nursing note reviewed.  Constitutional:      Appearance: Normal appearance.  Cardiovascular:     Rate and Rhythm: Normal rate and regular rhythm.     Pulses: Normal pulses.     Heart sounds: Normal heart sounds.  Pulmonary:     Effort: Pulmonary effort is normal.     Breath sounds: Normal breath sounds.  Musculoskeletal:     Cervical back: Normal range of motion and neck supple.     Comments: Normal Muscle Bulk and Muscle Testing Reveals:  Upper Extremities: Full  ROM and Muscle Strength 5/5  Lower Extremities: Full ROM and Muscle Strength 5/5 Arises from chair with ease Narrow Based Gait   Skin:    General: Skin is warm and dry.  Neurological:     Mental Status: He is alert and oriented to person, place, and time.  Psychiatric:        Mood and Affect: Mood normal.  Behavior: Behavior normal.           Assessment & Plan:  1. Chronic left inguinal pain: S/P inguinal hernia (indirect) and mesh repair. Continue to Monitor.11/18//2021. Refilled: Hydrocodone 10/325 mg one tablet daily as needed, may take and extra tablet when pain is moderate to sever 15 days out of the month.# 45.Second script sent for the following month. We will continue the opioid monitoring program, this consists of regular clinic visits, examinations, urine drug screen, pill counts as well as use of [place] Controlled Substance Reporting system. A 12 month History has been reviewed on the [place] Controlled Substance Reporting System on 02/08/2020.  2. Lumbosacral Spondylosis: Continue with Exercise regime. Continue to Monitor.02/08/2020 3. Muscle Spasm:Continuecurrent medication regimen withFlexeril. Continue to monitor.02/08/2020 4.Chronic Bilateral Shoulder PainR>LChronic Pain Syndrome:Continue HEP as tolerated.1118/2021. 5. Cervicalgia/ Cervical Radiculitis: Continue HEP as Tolerated. Alternate heat and Ice Therapy as tolerated. Continue current medication regimen. Continue to Monitor. 02/08/2020   F/U in 2 months

## 2020-02-15 LAB — TOXASSURE SELECT,+ANTIDEPR,UR

## 2020-02-26 ENCOUNTER — Telehealth: Payer: Self-pay | Admitting: *Deleted

## 2020-02-26 NOTE — Telephone Encounter (Signed)
Urine drug screen for this encounter is consistent for prescribed medication 

## 2020-04-02 DIAGNOSIS — H43393 Other vitreous opacities, bilateral: Secondary | ICD-10-CM | POA: Diagnosis not present

## 2020-04-02 DIAGNOSIS — H2511 Age-related nuclear cataract, right eye: Secondary | ICD-10-CM | POA: Diagnosis not present

## 2020-04-09 ENCOUNTER — Encounter: Payer: Self-pay | Admitting: Registered Nurse

## 2020-04-09 ENCOUNTER — Other Ambulatory Visit: Payer: Self-pay

## 2020-04-09 ENCOUNTER — Encounter: Payer: Medicare Other | Attending: Physical Medicine & Rehabilitation | Admitting: Registered Nurse

## 2020-04-09 VITALS — BP 147/88 | HR 81 | Temp 97.9°F | Ht 68.0 in | Wt 200.0 lb

## 2020-04-09 DIAGNOSIS — M47817 Spondylosis without myelopathy or radiculopathy, lumbosacral region: Secondary | ICD-10-CM | POA: Diagnosis not present

## 2020-04-09 DIAGNOSIS — M25512 Pain in left shoulder: Secondary | ICD-10-CM | POA: Diagnosis not present

## 2020-04-09 DIAGNOSIS — G894 Chronic pain syndrome: Secondary | ICD-10-CM | POA: Insufficient documentation

## 2020-04-09 DIAGNOSIS — M25511 Pain in right shoulder: Secondary | ICD-10-CM | POA: Diagnosis not present

## 2020-04-09 DIAGNOSIS — M542 Cervicalgia: Secondary | ICD-10-CM | POA: Insufficient documentation

## 2020-04-09 DIAGNOSIS — G8929 Other chronic pain: Secondary | ICD-10-CM | POA: Diagnosis not present

## 2020-04-09 DIAGNOSIS — Z79891 Long term (current) use of opiate analgesic: Secondary | ICD-10-CM | POA: Insufficient documentation

## 2020-04-09 DIAGNOSIS — Z5181 Encounter for therapeutic drug level monitoring: Secondary | ICD-10-CM | POA: Diagnosis not present

## 2020-04-09 DIAGNOSIS — M7918 Myalgia, other site: Secondary | ICD-10-CM | POA: Insufficient documentation

## 2020-04-09 MED ORDER — HYDROCODONE-ACETAMINOPHEN 10-325 MG PO TABS: 1.0000 | ORAL_TABLET | Freq: Two times a day (BID) | ORAL | 0 refills | Status: DC | PRN

## 2020-04-09 NOTE — Progress Notes (Signed)
Subjective:    Patient ID: Shaun Lang, male    DOB: 06/08/1955, 65 y.o.   MRN: [id number]  HPI: Shaun Lang is a 65 y.o. male who returns for follow up appointment for chronic pain and medication refill. He states his pain is located in his neck, bilateral shoulder pain and lower back pain. He rates his pain 7. His current exercise regime is walking and performing stretching exercises.  [name] Morphine equivalent is 19.57 MME.  Last UDS was Performed on 02/08/2020, it was consistent.     Pain Inventory Average Pain 8 Pain Right Now 7 My pain is sharp, stabbing and aching  In the last 24 hours, has pain interfered with the following? General activity 7 Relation with others 9 Enjoyment of life 8 What TIME of day is your pain at its worst? daytime and evening Sleep (in general) Poor  Pain is worse with: bending, sitting and inactivity Pain improves with: medication Relief from Meds: 7  Family History  Problem Relation Age of Onset  . Diabetes Mother   . Cancer Sister        breast  . Diabetes Sister   . Stroke Sister 13  . Breast cancer Sister   . Colon cancer Neg Hx   . Esophageal cancer Neg Hx   . Rectal cancer Neg Hx   . Stomach cancer Neg Hx    Social History   Socioeconomic History  . Marital status: Single    Spouse name: Not on file  . Number of children: Not on file  . Years of education: Not on file  . Highest education level: Not on file  Occupational History    Comment: unemployed  Tobacco Use  . Smoking status: Current Every Day Smoker    Packs/day: 1.00    Years: 33.00    Pack years: 33.00    Types: Cigarettes  . Smokeless tobacco: Never Used  Vaping Use  . Vaping Use: Never used  Substance and Sexual Activity  . Alcohol use: Yes    Alcohol/week: 3.0 standard drinks    Types: 3 Shots of liquor per week  . Drug use: No  . Sexual activity: Yes  Other Topics Concern  . Not on file  Social History Narrative  . Not on file   Social  Determinants of Health   Financial Resource Strain: Low Risk   . Difficulty of Paying Living Expenses: Not hard at all  Food Insecurity: No Food Insecurity  . Worried About Programme researcher, broadcasting/film/video in the Last Year: Never true  . Ran Out of Food in the Last Year: Never true  Transportation Needs: No Transportation Needs  . Lack of Transportation (Medical): No  . Lack of Transportation (Non-Medical): No  Physical Activity: Insufficiently Active  . Days of Exercise per Week: 3 days  . Minutes of Exercise per Session: 10 min  Stress: No Stress Concern Present  . Feeling of Stress : Not at all  Social Connections: Unknown  . Frequency of Communication with Friends and Family: More than three times a week  . Frequency of Social Gatherings with Friends and Family: More than three times a week  . Attends Religious Services: Never  . Active Member of Clubs or Organizations: No  . Attends Banker Meetings: Never  . Marital Status: Not on file   Past Surgical History:  Procedure Laterality Date  . BUNIONECTOMY Left 07/15/2017   with big toe fracture repair  . COLONOSCOPY  07/18/2012   polyps-Dr.Outlaw  . HERNIA REPAIR  04/1998   [facility]  . NECK SURGERY  07/21/02 and 01/2006  . WRIST SURGERY  1992   right   Past Surgical History:  Procedure Laterality Date  . BUNIONECTOMY Left 07/15/2017   with big toe fracture repair  . COLONOSCOPY  07/18/2012   polyps-Dr.Outlaw  . HERNIA REPAIR  04/1998   [facility]  . NECK SURGERY  07/21/02 and 01/2006  . WRIST SURGERY  1992   right   Past Medical History:  Diagnosis Date  . Bell's palsy April 2010  . Chest pain with low risk for cardiac etiology    Low Risk Myoview Sept 2013  . DJD (degenerative joint disease)    disabilty secondary to neck issues  . Hyperlipidemia   . Hypertension   . Sleep apnea    does not use CPAP  . Smoker    BP (!) 147/88   Pulse 81   Temp 97.9 F (36.6 C)   Ht 5\' 8"  (1.727 m)   Wt 200 lb (90.7 kg)   SpO2 96%    BMI 30.41 kg/m   Opioid Risk Score:   Fall Risk Score:  `1  Depression screen PHQ 2/9  Depression screen [facility] 2/9 12/11/2019 10/10/2019 09/14/2019 07/25/2019 08/23/2018 06/22/2018 02/28/2018  Decreased Interest 0 0 0 0 0 0 0  Down, Depressed, Hopeless 0 0 0 0 0 0 0  PHQ - 2 Score 0 0 0 0 0 0 0  Altered sleeping - - - - - - -  Tired, decreased energy - - - - - - -  Change in appetite - - - - - - -  Feeling bad or failure about yourself  - - - - - - -  Trouble concentrating - - - - - - -  Moving slowly or fidgety/restless - - - - - - -  Suicidal thoughts - - - - - - -  PHQ-9 Score - - - - - - -    Review of Systems  Musculoskeletal: Positive for back pain.       Shoulder pain  All other systems reviewed and are negative.      Objective:   Physical Exam Vitals and nursing note reviewed.  Constitutional:      Appearance: Normal appearance.  Neck:     Comments: Cervical Paraspinal Tenderness: C-5-C-6 Cardiovascular:     Rate and Rhythm: Normal rate and regular rhythm.     Pulses: Normal pulses.     Heart sounds: Normal heart sounds.  Pulmonary:     Effort: Pulmonary effort is normal.     Breath sounds: Normal breath sounds.  Musculoskeletal:     Cervical back: Normal range of motion and neck supple.     Comments: Normal Muscle Bulk and Muscle Testing Reveals:  Upper Extremities: Full ROM and Muscle Strength 5/5  Lumbar Paraspinal Tenderness: L-3-L-5 Bilateral Greater Trochanter Tenderness Lower Extremities: Full ROM and Muscle Strength 5/5 Arises from Table with ease Narrow Based Gait   Skin:    General: Skin is warm and dry.  Neurological:     Mental Status: He is alert and oriented to person, place, and time.  Psychiatric:        Mood and Affect: Mood normal.        Behavior: Behavior normal.           Assessment & Plan:  1. Chronic left inguinal pain: S/P inguinal hernia (indirect) and mesh repair. Continue  to Monitor.01/18//2022. Refilled: Hydrocodone  10/325 mg one tablet daily as needed, may take and extra tablet when pain is moderate to sever 15 days out of the month.# 45.Second script sent for the following month. We will continue the opioid monitoring program, this consists of regular clinic visits, examinations, urine drug screen, pill counts as well as use of [place] Controlled Substance Reporting system. A 12 month History has been reviewed on the [place] Controlled Substance Reporting Systemon 04/09/2020. 2. Lumbosacral Spondylosis: Continue with Exercise regime. Continue to Monitor.04/09/2020 3. Muscle Spasm:Continuecurrent medication regimen withFlexeril. Continue to monitor.04/09/2020 4.Chronic Bilateral Shoulder PainR>LChronic Pain Syndrome:Continue HEP as tolerated.04/10/2019. 5. Cervicalgia/ Cervical Radiculitis: Continue HEP as Tolerated. Alternate heat and Ice Therapy as tolerated. Continue current medication regimen. Continue to Monitor.04/09/2020   F/U in 2 months

## 2020-05-22 ENCOUNTER — Ambulatory Visit: Payer: Medicare Other | Admitting: *Deleted

## 2020-05-22 ENCOUNTER — Ambulatory Visit: Payer: Medicare Other | Attending: Critical Care Medicine

## 2020-05-22 DIAGNOSIS — Z23 Encounter for immunization: Secondary | ICD-10-CM

## 2020-05-22 NOTE — Progress Notes (Signed)
Patient presents for vaccine injection today. Patient tolerated injection well and was observed without any concerns.  

## 2020-05-22 NOTE — Progress Notes (Signed)
   Covid-19 Vaccination Clinic  Name:  Shaun Lang    MRN: [id number] DOB: 10-16-1955  05/22/2020  [name] was observed post Covid-19 immunization for 15 minutes without incident. He was provided with Vaccine Information Sheet and instruction to access the V-Safe system.   [name] was instructed to call 911 with any severe reactions post vaccine: [name] Difficulty breathing  . Swelling of face and throat  . A fast heartbeat  . A bad rash all over body  . Dizziness and weakness   Immunizations Administered    Name Date Dose VIS Date Route   Moderna Covid-19 Booster Vaccine 05/22/2020 10:00 AM 0.25 mL 01/10/2020 Intramuscular   Manufacturer: Moderna   Lot: [id number]   NDC: 94801-655-37

## 2020-06-11 ENCOUNTER — Encounter: Payer: Medicare Other | Attending: Physical Medicine & Rehabilitation | Admitting: Registered Nurse

## 2020-06-11 ENCOUNTER — Encounter: Payer: Self-pay | Admitting: Registered Nurse

## 2020-06-11 ENCOUNTER — Other Ambulatory Visit: Payer: Self-pay

## 2020-06-11 VITALS — BP 143/78 | HR 72 | Temp 98.0°F | Ht 68.0 in | Wt 196.2 lb

## 2020-06-11 DIAGNOSIS — Z560 Unemployment, unspecified: Secondary | ICD-10-CM | POA: Insufficient documentation

## 2020-06-11 DIAGNOSIS — Z9181 History of falling: Secondary | ICD-10-CM | POA: Diagnosis not present

## 2020-06-11 DIAGNOSIS — G8929 Other chronic pain: Secondary | ICD-10-CM | POA: Diagnosis not present

## 2020-06-11 DIAGNOSIS — M47817 Spondylosis without myelopathy or radiculopathy, lumbosacral region: Secondary | ICD-10-CM

## 2020-06-11 DIAGNOSIS — M5412 Radiculopathy, cervical region: Secondary | ICD-10-CM | POA: Insufficient documentation

## 2020-06-11 DIAGNOSIS — Z79891 Long term (current) use of opiate analgesic: Secondary | ICD-10-CM

## 2020-06-11 DIAGNOSIS — Z760 Encounter for issue of repeat prescription: Secondary | ICD-10-CM | POA: Diagnosis not present

## 2020-06-11 DIAGNOSIS — F17210 Nicotine dependence, cigarettes, uncomplicated: Secondary | ICD-10-CM | POA: Diagnosis not present

## 2020-06-11 DIAGNOSIS — M25512 Pain in left shoulder: Secondary | ICD-10-CM | POA: Diagnosis not present

## 2020-06-11 DIAGNOSIS — M545 Low back pain, unspecified: Secondary | ICD-10-CM | POA: Diagnosis not present

## 2020-06-11 DIAGNOSIS — R1030 Lower abdominal pain, unspecified: Secondary | ICD-10-CM | POA: Insufficient documentation

## 2020-06-11 DIAGNOSIS — M25561 Pain in right knee: Secondary | ICD-10-CM

## 2020-06-11 DIAGNOSIS — M7918 Myalgia, other site: Secondary | ICD-10-CM | POA: Diagnosis not present

## 2020-06-11 DIAGNOSIS — M62838 Other muscle spasm: Secondary | ICD-10-CM | POA: Insufficient documentation

## 2020-06-11 DIAGNOSIS — Z5181 Encounter for therapeutic drug level monitoring: Secondary | ICD-10-CM

## 2020-06-11 DIAGNOSIS — W19XXXD Unspecified fall, subsequent encounter: Secondary | ICD-10-CM

## 2020-06-11 DIAGNOSIS — G894 Chronic pain syndrome: Secondary | ICD-10-CM

## 2020-06-11 DIAGNOSIS — Y92009 Unspecified place in unspecified non-institutional (private) residence as the place of occurrence of the external cause: Secondary | ICD-10-CM | POA: Diagnosis not present

## 2020-06-11 DIAGNOSIS — M25511 Pain in right shoulder: Secondary | ICD-10-CM | POA: Diagnosis not present

## 2020-06-11 DIAGNOSIS — M25562 Pain in left knee: Secondary | ICD-10-CM | POA: Diagnosis not present

## 2020-06-11 MED ORDER — HYDROCODONE-ACETAMINOPHEN 10-325 MG PO TABS: 1.0000 | ORAL_TABLET | Freq: Two times a day (BID) | ORAL | 0 refills | Status: DC | PRN

## 2020-06-11 NOTE — Progress Notes (Signed)
Subjective:    Patient ID: [name], male    DOB: 09/23/1955, 65 y.o.   MRN: [id number]  HPI: Shaun Lang is a 65 y.o. male who returns for follow up appointment for chronic pain and medication refill. He states his pain is located in his lower back. He rates his pain 8. His current exercise regime is walking and performing stretching exercises.  Shaun Lang reports he had two falls this month, a few weeks ago he was walking in home and his right knee gave out, he fell forward and landed on his knees. He was able to pick himself up, he didn't seek medical attention. Last week Thursday he states he was at the [facility] walking down the stairs and his depth perception was off, he also states he has new glasses and have been having problems when wearing them. He was educated on falls prevention and instructed to go see the optometrist regarding his glasses, he verbalizes understanding.   Shaun Lang, Shaun Lang also reports a week ago he was having some left fore arm muscle twitching ( Fasciculation) for a few seconds that has resolved. We reviewed his medications and denies any muscle twitching at this time. He will F/U with PCP and he verbalizes understanding.   Shaun Lang equivalent is 19.57 MME.  UDS ordered today.    Pain Inventory Average Pain 9 Pain Right Now 8 My pain is sharp, stabbing, tingling and aching  In the last 24 hours, has pain interfered with the following? General activity 6 Relation with others 9 Enjoyment of life 8 What TIME of day is your pain at its worst? morning  and daytime Sleep (in general) Poor  Pain is worse with: walking, bending, inactivity, standing and some activites Pain improves with: not answered Relief from Meds: 7  Family History  Problem Relation Age of Onset  . Diabetes Mother   . Cancer Sister        breast  . Diabetes Sister   . Stroke Sister 55  . Breast cancer Sister   . Colon cancer Neg Hx   . Esophageal cancer Neg Hx   . Rectal  cancer Neg Hx   . Stomach cancer Neg Hx    Social History   Socioeconomic History  . Marital status: Single    Spouse name: Not on file  . Number of children: Not on file  . Years of education: Not on file  . Highest education level: Not on file  Occupational History    Comment: unemployed  Tobacco Use  . Smoking status: Current Every Day Smoker    Packs/day: 1.00    Years: 33.00    Pack years: 33.00    Types: Cigarettes  . Smokeless tobacco: Never Used  Vaping Use  . Vaping Use: Never used  Substance and Sexual Activity  . Alcohol use: Yes    Alcohol/week: 3.0 standard drinks    Types: 3 Shots of liquor per week  . Drug use: No  . Sexual activity: Yes  Other Topics Concern  . Not on file  Social History Narrative  . Not on file   Social Determinants of Health   Financial Resource Strain: Low Risk   . Difficulty of Paying Living Expenses: Not hard at all  Food Insecurity: No Food Insecurity  . Worried About Programme researcher, broadcasting/film/video in the Last Year: Never true  . Ran Out of Food in the Last Year: Never true  Transportation Needs: No Transportation Needs  .  Lack of Transportation (Medical): No  . Lack of Transportation (Non-Medical): No  Physical Activity: Insufficiently Active  . Days of Exercise per Week: 3 days  . Minutes of Exercise per Session: 10 min  Stress: No Stress Concern Present  . Feeling of Stress : Not at all  Social Connections: Unknown  . Frequency of Communication with Friends and Family: More than three times a week  . Frequency of Social Gatherings with Friends and Family: More than three times a week  . Attends Religious Services: Never  . Active Member of Clubs or Organizations: No  . Attends Banker Meetings: Never  . Marital Status: Not on file   Past Surgical History:  Procedure Laterality Date  . BUNIONECTOMY Left 07/15/2017   with big toe fracture repair  . COLONOSCOPY  07/18/2012   polyps-Dr.Outlaw  . HERNIA REPAIR   04/1998   [facility]  . NECK SURGERY  07/21/02 and 01/2006  . WRIST SURGERY  1992   right   Past Surgical History:  Procedure Laterality Date  . BUNIONECTOMY Left 07/15/2017   with big toe fracture repair  . COLONOSCOPY  07/18/2012   polyps-Dr.Outlaw  . HERNIA REPAIR  04/1998   LI[facility]  . NECK SURGERY  07/21/02 and 01/2006  . WRIST SURGERY  1992   right   Past Medical History:  Diagnosis Date  . Bell's palsy April 2010  . Chest pain with low risk for cardiac etiology    Low Risk Myoview Sept 2013  . DJD (degenerative joint disease)    disabilty secondary to neck issues  . Hyperlipidemia   . Hypertension   . Sleep apnea    does not use CPAP  . Smoker    BP (!) 143/78   Pulse 72   Temp 98 F (36.7 C)   Ht 5\' 8"  (1.727 m)   Wt 196 lb 3.2 oz (89 kg)   SpO2 98%   BMI 29.83 kg/m   Opioid Risk Score:   Fall Risk Score:  `1  Depression screen PHQ 2/9  Depression screen [facility] 2/9 12/11/2019 10/10/2019 09/14/2019 07/25/2019 08/23/2018 06/22/2018 02/28/2018  Decreased Interest 0 0 0 0 0 0 0  Down, Depressed, Hopeless 0 0 0 0 0 0 0  PHQ - 2 Score 0 0 0 0 0 0 0  Altered sleeping - - - - - - -  Tired, decreased energy - - - - - - -  Change in appetite - - - - - - -  Feeling bad or failure about yourself  - - - - - - -  Trouble concentrating - - - - - - -  Moving slowly or fidgety/restless - - - - - - -  Suicidal thoughts - - - - - - -  PHQ-9 Score - - - - - - -     Review of Systems  Constitutional: Negative.   HENT: Negative.   Eyes: Negative.   Respiratory: Negative.   Cardiovascular: Negative.   Gastrointestinal: Negative.   Endocrine: Negative.   Genitourinary: Negative.   Musculoskeletal: Positive for back pain.       Lower neck pain  Skin: Negative.   Allergic/Immunologic: Negative.   Neurological: Negative.   Hematological: Negative.   Psychiatric/Behavioral: Negative.        Objective:   Physical Exam Vitals and nursing note reviewed.  Constitutional:       Appearance: Normal appearance.  Cardiovascular:     Rate and Rhythm: Normal rate and  regular rhythm.     Pulses: Normal pulses.     Heart sounds: Normal heart sounds.  Pulmonary:     Effort: Pulmonary effort is normal.     Breath sounds: Normal breath sounds.  Musculoskeletal:     Cervical back: Normal range of motion and neck supple.     Comments: Normal Muscle Bulk and Muscle Testing Reveals:  Upper Extremities: Full ROM and Muscle Strength 5/5 Lumbar Paraspinal Tenderness: L-4-L-5 Mainly Right Side Lower Extremities: Full ROM and Muscle Strength 5/5 Arises from Table with ease Narrow Based Gait   Skin:    General: Skin is warm and dry.  Neurological:     Mental Status: He is oriented to person, place, and time.  Psychiatric:        Mood and Affect: Mood normal.        Behavior: Behavior normal.           Assessment & Plan:  1. Chronic left inguinal pain: S/P inguinal hernia (indirect) and mesh repair. Continue to Monitor.03/22//2022. Refilled: Hydrocodone 10/325 mg one tablet daily as needed, may take and extra tablet when pain is moderate to sever 15 days out of the month.# 45.Second script sent for the following month. We will continue the opioid monitoring program, this consists of regular clinic visits, examinations, urine drug screen, pill counts as well as use of [place] Controlled Substance Reporting system. A 12 month History has been reviewed on the [place] Controlled Substance Reporting Systemon03/22/2022. 2. Lumbosacral Spondylosis: Continue with Exercise regime. Continue to Monitor.06/11/2020 3. Muscle Spasm:Continuecurrent medication regimen withFlexeril. Continue to monitor.06/11/2020 4.Chronic Bilateral Shoulder Pain: No complaints today. R>LChronic Pain Syndrome:Continue HEP as tolerated.06/12/2019. 5. Cervicalgia/ Cervical Radiculitis: No complaints today.Continue HEP as Tolerated. Alternate heat and Ice Therapy as tolerated.  Continue current medication regimen. Continue to Monitor.06/11/2020   F/U in 2 months

## 2020-06-18 LAB — TOXASSURE SELECT,+ANTIDEPR,UR

## 2020-06-25 ENCOUNTER — Telehealth: Payer: Self-pay | Admitting: *Deleted

## 2020-06-25 NOTE — Telephone Encounter (Signed)
Urine drug screen for this encounter is consistent for prescribed medication 

## 2020-08-09 ENCOUNTER — Encounter: Payer: Medicare Other | Attending: Physical Medicine & Rehabilitation | Admitting: Registered Nurse

## 2020-08-09 ENCOUNTER — Encounter: Payer: Self-pay | Admitting: Registered Nurse

## 2020-08-09 ENCOUNTER — Other Ambulatory Visit: Payer: Self-pay

## 2020-08-09 VITALS — BP 137/77 | HR 71 | Temp 98.7°F | Ht 68.0 in | Wt 192.4 lb

## 2020-08-09 DIAGNOSIS — M7918 Myalgia, other site: Secondary | ICD-10-CM | POA: Insufficient documentation

## 2020-08-09 DIAGNOSIS — G894 Chronic pain syndrome: Secondary | ICD-10-CM | POA: Insufficient documentation

## 2020-08-09 DIAGNOSIS — M542 Cervicalgia: Secondary | ICD-10-CM | POA: Insufficient documentation

## 2020-08-09 DIAGNOSIS — Z79891 Long term (current) use of opiate analgesic: Secondary | ICD-10-CM | POA: Insufficient documentation

## 2020-08-09 DIAGNOSIS — M47817 Spondylosis without myelopathy or radiculopathy, lumbosacral region: Secondary | ICD-10-CM | POA: Diagnosis not present

## 2020-08-09 DIAGNOSIS — Z5181 Encounter for therapeutic drug level monitoring: Secondary | ICD-10-CM | POA: Insufficient documentation

## 2020-08-09 DIAGNOSIS — M5412 Radiculopathy, cervical region: Secondary | ICD-10-CM | POA: Diagnosis not present

## 2020-08-09 MED ORDER — HYDROCODONE-ACETAMINOPHEN 10-325 MG PO TABS: 1.0000 | ORAL_TABLET | Freq: Two times a day (BID) | ORAL | 0 refills | Status: DC | PRN

## 2020-08-09 NOTE — Progress Notes (Signed)
Subjective:    Patient ID: Shaun Lang, male    DOB: 1956-02-09, 66 y.o.   MRN: [id number]  HPI: Shaun Lang is a 65 y.o. male who returns for follow up appointment for chronic pain and medication refill. He states his pain is located in his neck radiating into his bilateral shoulders and lower back pain.He rates his  Pain 7. His current exercise regime is walking and performing stretching exercises.  'Shaun Lang Morphine equivalent is 15.00  MME.  Last UDS was Performed on 06/11/2020, it was consistent.   Pain Inventory Average Pain 7 Pain Right Now 7 My pain is sharp, stabbing, tingling and aching  In the last 24 hours, has pain interfered with the following? General activity 5 Relation with others 9 Enjoyment of life 7 What TIME of day is your pain at its worst? daytime and night Sleep (in general) Poor  Pain is worse with: walking, bending, sitting and inactivity Pain improves with: medication Relief from Meds: 7  Family History  Problem Relation Age of Onset  . Diabetes Mother   . Cancer Sister        breast  . Diabetes Sister   . Stroke Sister 37  . Breast cancer Sister   . Colon cancer Neg Hx   . Esophageal cancer Neg Hx   . Rectal cancer Neg Hx   . Stomach cancer Neg Hx    Social History   Socioeconomic History  . Marital status: Single    Spouse name: Not on file  . Number of children: Not on file  . Years of education: Not on file  . Highest education level: Not on file  Occupational History    Comment: unemployed  Tobacco Use  . Smoking status: Current Every Day Smoker    Packs/day: 1.00    Years: 33.00    Pack years: 33.00    Types: Cigarettes  . Smokeless tobacco: Never Used  Vaping Use  . Vaping Use: Never used  Substance and Sexual Activity  . Alcohol use: Yes    Alcohol/week: 3.0 standard drinks    Types: 3 Shots of liquor per week  . Drug use: No  . Sexual activity: Yes  Other Topics Concern  . Not on file  Social History  Narrative  . Not on file   Social Determinants of Health   Financial Resource Strain: Low Risk   . Difficulty of Paying Living Expenses: Not hard at all  Food Insecurity: No Food Insecurity  . Worried About Programme researcher, broadcasting/film/video in the Last Year: Never true  . Ran Out of Food in the Last Year: Never true  Transportation Needs: No Transportation Needs  . Lack of Transportation (Medical): No  . Lack of Transportation (Non-Medical): No  Physical Activity: Insufficiently Active  . Days of Exercise per Week: 3 days  . Minutes of Exercise per Session: 10 min  Stress: No Stress Concern Present  . Feeling of Stress : Not at all  Social Connections: Unknown  . Frequency of Communication with Friends and Family: More than three times a week  . Frequency of Social Gatherings with Friends and Family: More than three times a week  . Attends Religious Services: Never  . Active Member of Clubs or Organizations: No  . Attends Banker Meetings: Never  . Marital Status: Not on file   Past Surgical History:  Procedure Laterality Date  . BUNIONECTOMY Left 07/15/2017   with big toe fracture repair  .  COLONOSCOPY  07/18/2012   polyps-Dr.Outlaw  . HERNIA REPAIR  04/1998   LI[facility]  . NECK SURGERY  07/21/02 and 01/2006  . WRIST SURGERY  1992   right   Past Surgical History:  Procedure Laterality Date  . BUNIONECTOMY Left 07/15/2017   with big toe fracture repair  . COLONOSCOPY  07/18/2012   polyps-Dr.Outlaw  . HERNIA REPAIR  04/1998   LIH  . NECK SURGERY  07/21/02 and 01/2006  . WRIST SURGERY  1992   right   Past Medical History:  Diagnosis Date  . Bell's palsy April 2010  . Chest pain with low risk for cardiac etiology    Low Risk Myoview Sept 2013  . DJD (degenerative joint disease)    disabilty secondary to neck issues  . Hyperlipidemia   . Hypertension   . Sleep apnea    does not use CPAP  . Smoker    BP 137/77   Pulse 71   Temp 98.7 F (37.1 C)   Ht 5\' 8"  (1.727 m)    Wt 192 lb 6.4 oz (87.3 kg)   SpO2 98%   BMI 29.25 kg/m   Opioid Risk Score:   Fall Risk Score:  `1  Depression screen PHQ 2/9  Depression screen [facility] 2/9 08/09/2020 06/11/2020 12/11/2019 10/10/2019 09/14/2019 07/25/2019 08/23/2018  Decreased Interest 0 0 0 0 0 0 0  Down, Depressed, Hopeless 0 0 0 0 0 0 0  PHQ - 2 Score 0 0 0 0 0 0 0  Altered sleeping - - - - - - -  Tired, decreased energy - - - - - - -  Change in appetite - - - - - - -  Feeling bad or failure about yourself  - - - - - - -  Trouble concentrating - - - - - - -  Moving slowly or fidgety/restless - - - - - - -  Suicidal thoughts - - - - - - -  PHQ-9 Score - - - - - - -    Review of Systems  Constitutional: Negative.   HENT: Negative.   Eyes: Negative.   Respiratory: Negative.   Cardiovascular: Negative.   Gastrointestinal: Negative.   Endocrine: Negative.   Genitourinary: Negative.   Musculoskeletal: Positive for back pain.       Shoulder pain  Skin: Negative.   Allergic/Immunologic: Negative.   Neurological: Negative.   Hematological: Negative.   Psychiatric/Behavioral: Negative.   All other systems reviewed and are negative.      Objective:   Physical Exam Vitals and nursing note reviewed.  Constitutional:      Appearance: Normal appearance.  Cardiovascular:     Rate and Rhythm: Normal rate and regular rhythm.     Pulses: Normal pulses.     Heart sounds: Normal heart sounds.  Pulmonary:     Effort: Pulmonary effort is normal.     Breath sounds: Normal breath sounds.  Musculoskeletal:     Cervical back: Normal range of motion and neck supple.     Comments: Normal Muscle Bulk and Muscle Testing Reveals:  Upper Extremities: Full ROM and Muscle Strength 5/5  Lumbar Paraspinal Tenderness: L-3-L-5 Left Greater Trochanter Tenderness Lower Extremities: Full ROM and Muscle Strength 5/5 Arises from chair with ease Narrow Based  Gait   Skin:    General: Skin is warm and dry.  Neurological:     Mental  Status: He is alert and oriented to person, place, and time.  Psychiatric:  Mood and Affect: Mood normal.        Behavior: Behavior normal.           Assessment & Plan:  1. Chronic left inguinal pain: S/P inguinal hernia (indirect) and mesh repair. Continue to Monitor.05/20//2022. Refilled: Hydrocodone 10/325 mg one tablet daily as needed, may take and extra tablet when pain is moderate to sever 15 days out of the month.# 45.Second script sent for the following month. We will continue the opioid monitoring program, this consists of regular clinic visits, examinations, urine drug screen, pill counts as well as use of [place] Controlled Substance Reporting system. A 12 month History has been reviewed on the [place] Controlled Substance Reporting Systemon05/20/2022. 2. Lumbosacral Spondylosis: Continue with Exercise regime. Continue to Monitor.08/09/2020 3. Muscle Spasm:Continuecurrent medication regimen withFlexeril. Continue to monitor.08/09/2020 4.Chronic Bilateral Shoulder Pain: No complaints today. R>LChronic Pain Syndrome:Continue HEP as tolerated.08/10/2019. 5. Cervicalgia/ Cervical Radiculitis: .Continue HEP as Tolerated. Alternate heat and Ice Therapy as tolerated. Continue current medication regimen. Continue to Monitor.08/09/2020   F/U in 2 months

## 2020-09-07 DIAGNOSIS — Z20822 Contact with and (suspected) exposure to covid-19: Secondary | ICD-10-CM | POA: Diagnosis not present

## 2020-09-19 ENCOUNTER — Ambulatory Visit: Payer: Medicare Other

## 2020-10-01 ENCOUNTER — Ambulatory Visit (INDEPENDENT_AMBULATORY_CARE_PROVIDER_SITE_OTHER): Payer: Medicare Other | Admitting: *Deleted

## 2020-10-01 DIAGNOSIS — Z0000 Encounter for general adult medical examination without abnormal findings: Secondary | ICD-10-CM | POA: Diagnosis not present

## 2020-10-01 NOTE — Patient Instructions (Signed)
Shaun Lang , Thank you for taking time to come for your Medicare Wellness Visit. I appreciate your ongoing commitment to your health goals. Please review the following plan we discussed and let me know if I can assist you in the future.   Screening recommendations/referrals: Colonoscopy: up to date Recommended yearly ophthalmology/optometry visit for glaucoma screening and checkup Recommended yearly dental visit for hygiene and checkup  Vaccinations: Influenza vaccine: Education provided Pneumococcal vaccine: Education provided Tdap vaccine: Education provided Shingles vaccine: Education provided    Advanced directives: Education Provided  Conditions/risks identified: NA    Preventive Care 65 Years and Older, Male Preventive care refers to lifestyle choices and visits with your health care provider that can promote health and wellness. What does preventive care include? A yearly physical exam. This is also called an annual well check. Dental exams once or twice a year. Routine eye exams. Ask your health care provider how often you should have your eyes checked. Personal lifestyle choices, including: Daily care of your teeth and gums. Regular physical activity. Eating a healthy diet. Avoiding tobacco and drug use. Limiting alcohol use. Practicing safe sex. Taking low doses of aspirin every day. Taking vitamin and mineral supplements as recommended by your health care provider. What happens during an annual well check? The services and screenings done by your health care provider during your annual well check will depend on your age, overall health, lifestyle risk factors, and family history of disease. Counseling  Your health care provider may ask you questions about your: Alcohol use. Tobacco use. Drug use. Emotional well-being. Home and relationship well-being. Sexual activity. Eating habits. History of falls. Memory and ability to understand (cognition). Work and work  Astronomer. Screening  You may have the following tests or measurements: Height, weight, and BMI. Blood pressure. Lipid and cholesterol levels. These may be checked every 5 years, or more frequently if you are over 75 years old. Skin check. Lung cancer screening. You may have this screening every year starting at age 30 if you have a 30-pack-year history of smoking and currently smoke or have quit within the past 15 years. Fecal occult blood test (FOBT) of the stool. You may have this test every year starting at age 67. Flexible sigmoidoscopy or colonoscopy. You may have a sigmoidoscopy every 5 years or a colonoscopy every 10 years starting at age 2. Prostate cancer screening. Recommendations will vary depending on your family history and other risks. Hepatitis C blood test. Hepatitis B blood test. Sexually transmitted disease (STD) testing. Diabetes screening. This is done by checking your blood sugar (glucose) after you have not eaten for a while (fasting). You may have this done every 1-3 years. Abdominal aortic aneurysm (AAA) screening. You may need this if you are a current or former smoker. Osteoporosis. You may be screened starting at age 74 if you are at high risk. Talk with your health care provider about your test results, treatment options, and if necessary, the need for more tests. Vaccines  Your health care provider may recommend certain vaccines, such as: Influenza vaccine. This is recommended every year. Tetanus, diphtheria, and acellular pertussis (Tdap, Td) vaccine. You may need a Td booster every 10 years. Zoster vaccine. You may need this after age 32. Pneumococcal 13-valent conjugate (PCV13) vaccine. One dose is recommended after age 30. Pneumococcal polysaccharide (PPSV23) vaccine. One dose is recommended after age 52. Talk to your health care provider about which screenings and vaccines you need and how often you need them.  This information is not intended to replace  advice given to you by your health care provider. Make sure you discuss any questions you have with your health care provider. Document Released: 04/05/2015 Document Revised: 11/27/2015 Document Reviewed: 01/08/2015 Elsevier Interactive Patient Education  2017 [employer] Prevention in the Home Falls can cause injuries. They can happen to people of all ages. There are many things you can do to make your home safe and to help prevent falls. What can I do on the outside of my home? Regularly fix the edges of walkways and driveways and fix any cracks. Remove anything that might make you trip as you walk through a door, such as a raised step or threshold. Trim any bushes or trees on the path to your home. Use bright outdoor lighting. Clear any walking paths of anything that might make someone trip, such as rocks or tools. Regularly check to see if handrails are loose or broken. Make sure that both sides of any steps have handrails. Any raised decks and porches should have guardrails on the edges. Have any leaves, snow, or ice cleared regularly. Use sand or salt on walking paths during winter. Clean up any spills in your garage right away. This includes oil or grease spills. What can I do in the bathroom? Use night lights. Install grab bars by the toilet and in the tub and shower. Do not use towel bars as grab bars. Use non-skid mats or decals in the tub or shower. If you need to sit down in the shower, use a plastic, non-slip stool. Keep the floor dry. Clean up any water that spills on the floor as soon as it happens. Remove soap buildup in the tub or shower regularly. Attach bath mats securely with double-sided non-slip rug tape. Do not have throw rugs and other things on the floor that can make you trip. What can I do in the bedroom? Use night lights. Make sure that you have a light by your bed that is easy to reach. Do not use any sheets or blankets that are too big for your bed.  They should not hang down onto the floor. Have a firm chair that has side arms. You can use this for support while you get dressed. Do not have throw rugs and other things on the floor that can make you trip. What can I do in the kitchen? Clean up any spills right away. Avoid walking on wet floors. Keep items that you use a lot in easy-to-reach places. If you need to reach something above you, use a strong step stool that has a grab bar. Keep electrical cords out of the way. Do not use floor polish or wax that makes floors slippery. If you must use wax, use non-skid floor wax. Do not have throw rugs and other things on the floor that can make you trip. What can I do with my stairs? Do not leave any items on the stairs. Make sure that there are handrails on both sides of the stairs and use them. Fix handrails that are broken or loose. Make sure that handrails are as long as the stairways. Check any carpeting to make sure that it is firmly attached to the stairs. Fix any carpet that is loose or worn. Avoid having throw rugs at the top or bottom of the stairs. If you do have throw rugs, attach them to the floor with carpet tape. Make sure that you have a light switch at the  top of the stairs and the bottom of the stairs. If you do not have them, ask someone to add them for you. What else can I do to help prevent falls? Wear shoes that: Do not have high heels. Have rubber bottoms. Are comfortable and fit you well. Are closed at the toe. Do not wear sandals. If you use a stepladder: Make sure that it is fully opened. Do not climb a closed stepladder. Make sure that both sides of the stepladder are locked into place. Ask someone to hold it for you, if possible. Clearly mark and make sure that you can see: Any grab bars or handrails. First and last steps. Where the edge of each step is. Use tools that help you move around (mobility aids) if they are needed. These  include: Canes. Walkers. Scooters. Crutches. Turn on the lights when you go into a dark area. Replace any light bulbs as soon as they burn out. Set up your furniture so you have a clear path. Avoid moving your furniture around. If any of your floors are uneven, fix them. If there are any pets around you, be aware of where they are. Review your medicines with your doctor. Some medicines can make you feel dizzy. This can increase your chance of falling. Ask your doctor what other things that you can do to help prevent falls. This information is not intended to replace advice given to you by your health care provider. Make sure you discuss any questions you have with your health care provider. Document Released: 01/03/2009 Document Revised: 08/15/2015 Document Reviewed: 04/13/2014 Elsevier Interactive Patient Education  2017 [employer].

## 2020-10-01 NOTE — Progress Notes (Addendum)
Subjective:   Shaun Lang is a 65 y.o. male who presents for Medicare Annual/Subsequent preventive examination.  I connected with  Norvel Richards on 10/01/20 by a telephone enabled telemedicine application and verified that I am speaking with the correct person using two identifiers.   I discussed the limitations of evaluation and management by telemedicine. The patient expressed understanding and agreed to proceed.  Patient location: home  Provider location:Tele-Health Visit  Review of Systems    NA Cardiac Risk Factors include: advanced age (>108men, >16 women);dyslipidemia;hypertension;male gender     Objective:    Today's Vitals   10/01/20 1500  PainSc: 3    There is no height or weight on file to calculate BMI.  Advanced Directives 10/01/2020 09/14/2019 12/05/2016 10/21/2016 08/21/2016 07/07/2016 04/29/2016  Does Patient Have a Medical Advance Directive? No No No No No No No  Would patient like information on creating a medical advance directive? No - Patient declined Yes (ED - Information included in AVS) Yes (ED - Information included in AVS) Yes (MAU/Ambulatory/Procedural Areas - Information given) Yes (MAU/Ambulatory/Procedural Areas - Information given) - -    Current Medications (verified) Outpatient Encounter Medications as of 10/01/2020  Medication Sig   atorvastatin (LIPITOR) 40 MG tablet Take 1 tablet (40 mg total) by mouth daily at 6 PM.   cyclobenzaprine (FLEXERIL) 10 MG tablet TAKE 1 TABLET BY MOUTH TWICE DAILY AS NEEDED FOR MUSCLE SPASM   diclofenac (VOLTAREN) 75 MG EC tablet Take 1 tablet (75 mg total) by mouth 2 (two) times daily with a meal.   gabapentin (NEURONTIN) 300 MG capsule Take 1 capsule (300 mg total) by mouth 2 (two) times daily.   HYDROcodone-acetaminophen (NORCO) 10-325 MG tablet Take 1 tablet by mouth 2 (two) times daily as needed.   lisinopril (ZESTRIL) 5 MG tablet Take 1 tablet (5 mg total) by mouth daily.   Multiple Vitamin (MULTIVITAMIN WITH  MINERALS) TABS tablet Take 1 tablet by mouth daily.   sildenafil (VIAGRA) 50 MG tablet Take 1 tablet (50 mg total) by mouth daily as needed for erectile dysfunction.   triamterene-hydrochlorothiazide (MAXZIDE-25) 37.5-25 MG tablet Take 1 tablet by mouth daily.   No facility-administered encounter medications on file as of 10/01/2020.    Allergies (verified) Patient has no known allergies.   History: Past Medical History:  Diagnosis Date   Bell's palsy April 2010   Chest pain with low risk for cardiac etiology    Low Risk Myoview Sept 2013   DJD (degenerative joint disease)    disabilty secondary to neck issues   Hyperlipidemia    Hypertension    Sleep apnea    does not use CPAP   Smoker    Past Surgical History:  Procedure Laterality Date   BUNIONECTOMY Left 07/15/2017   with big toe fracture repair   COLONOSCOPY  07/18/2012   polyps-Dr.Outlaw   HERNIA REPAIR  04/1998   [facility]   NECK SURGERY  07/21/02 and 01/2006   WRIST SURGERY  1992   right   Family History  Problem Relation Age of Onset   Diabetes Mother    Cancer Sister        breast   Diabetes Sister    Stroke Sister 29   Breast cancer Sister    Colon cancer Neg Hx    Esophageal cancer Neg Hx    Rectal cancer Neg Hx    Stomach cancer Neg Hx    Social History   Socioeconomic History   Marital  status: Single    Spouse name: Not on file   Number of children: Not on file   Years of education: Not on file   Highest education level: Not on file  Occupational History    Comment: unemployed  Tobacco Use   Smoking status: Every Day    Packs/day: 1.00    Years: 33.00    Pack years: 33.00    Types: Cigarettes   Smokeless tobacco: Never  Vaping Use   Vaping Use: Never used  Substance and Sexual Activity   Alcohol use: Yes    Alcohol/week: 3.0 standard drinks    Types: 3 Shots of liquor per week   Drug use: No   Sexual activity: Yes  Other Topics Concern   Not on file  Social History Narrative   Not on  file   Social Determinants of Health   Financial Resource Strain: Low Risk    Difficulty of Paying Living Expenses: Not hard at all  Food Insecurity: No Food Insecurity   Worried About Programme researcher, broadcasting/film/video in the Last Year: Never true   Ran Out of Food in the Last Year: Never true  Transportation Needs: No Transportation Needs   Lack of Transportation (Medical): No   Lack of Transportation (Non-Medical): No  Physical Activity: Not on file  Stress: No Stress Concern Present   Feeling of Stress : Not at all  Social Connections: Moderately Isolated   Frequency of Communication with Friends and Family: More than three times a week   Frequency of Social Gatherings with Friends and Family: More than three times a week   Attends Religious Services: 1 to 4 times per year   Active Member of [employer] or Organizations: No   Attends Engineer, structural: Never   Marital Status: Divorced    Tobacco Counseling Ready to quit: Not Answered Counseling given: Not Answered   Clinical Intake:  Pre-visit preparation completed: Yes  Pain : 0-10 Pain Score: 3  Pain Type: Chronic pain Pain Location: Back Pain Orientation: Lower Pain Descriptors / Indicators: Constant, Aching Pain Onset: More than a month ago Pain Frequency: Intermittent Pain Relieving Factors: flexiril ,  Pain Relieving Factors: flexiril ,  Nutritional Risks: None Diabetes: No  How often do you need to have someone help you when you read instructions, pamphlets, or other written materials from your doctor or pharmacy?: 1 - Never  Diabetic?  NO  Interpreter Needed?: No  Information entered by :: Remi Haggard LPN   Activities of Daily Living In your present state of health, do you have any difficulty performing the following activities: 10/01/2020  Hearing? N  Vision? N  Difficulty concentrating or making decisions? N  Walking or climbing stairs? N  Dressing or bathing? N  Doing errands, shopping? N   Preparing Food and eating ? N  Using the Toilet? N  In the past six months, have you accidently leaked urine? N  Do you have problems with loss of bowel control? N  Managing your Medications? N  Managing your Finances? N  Housekeeping or managing your Housekeeping? N  Some recent data might be hidden    Patient Care Team: Nche, Bonna Gains, NP as PCP - General (Internal Medicine)  Indicate any recent Medical Services you may have received from other than [facility] providers in the past year (date may be approximate).     Assessment:   This is a routine wellness examination for [facility].  Hearing/Vision screen Hearing Screening - Comments:: No  trouble hearing Vision Screening - Comments:: [facility] [phone] up to date  Dietary issues and exercise activities discussed: Current Exercise Habits: The patient does not participate in regular exercise at present   Goals Addressed             This Visit's Progress    Patient Stated       Would like to get back to gym         Depression Screen PHQ 2/9 Scores 10/01/2020 08/09/2020 06/11/2020 12/11/2019 10/10/2019 09/14/2019 07/25/2019  PHQ - 2 Score 0 0 0 0 0 0 0  PHQ- 9 Score - - - - - - -    Fall Risk Fall Risk  10/01/2020 08/09/2020 06/11/2020 04/09/2020 02/08/2020  Falls in the past year? 0 1 1 0 1  Comment - - - - -  Number falls in past yr: 0 1 1 - 1  Comment - last in March 2022 pt states it was last week 3/16-3/18/22 he slipped but no injuries just felt like he strecthed his back - -  Injury with Fall? 0 0 0 - 0  Risk Factor Category  - Moderate Risk (1 Point) - - -  Risk for fall due to : - History of fall(s) - - -  Follow up Falls evaluation completed;Falls prevention discussed - - - -  Comment - - - - -    FALL RISK PREVENTION PERTAINING TO THE HOME:  Any stairs in or around the home? Yes  If so, are there any without handrails? Yes  Home free of loose throw rugs in walkways, pet beds, electrical cords, etc? Yes   Adequate lighting in your home to reduce risk of falls? Yes   ASSISTIVE DEVICES UTILIZED TO PREVENT FALLS:  Life alert? No  Use of a cane, walker or w/c? No  Grab bars in the bathroom? No  Shower chair or bench in shower? No  Elevated toilet seat or a handicapped toilet? No   TIMED UP AND GO:  Was the test performed? No .  Tele-Health  Cognitive Function:  Normal cognitive status assessed by direct observation by this Nurse Health Advisor. No abnormalities found.       6CIT Screen 09/14/2019  What Year? 0 points  What month? 0 points  What time? 0 points  Count back from 20 0 points  Months in reverse 0 points  Repeat phrase 0 points  Total Score 0    Immunizations Immunization History  Administered Date(s) Administered   Moderna SARS-COV2 Booster Vaccination 05/22/2020   PFIZER(Purple Top)SARS-COV-2 Vaccination 05/28/2019, 06/25/2019    TDAP status: Due, Education has been provided regarding the importance of this vaccine. Advised may receive this vaccine at local pharmacy or Health Dept. Aware to provide a copy of the vaccination record if obtained from local pharmacy or Health Dept. Verbalized acceptance and understanding.  Flu Vaccine status: Due, Education has been provided regarding the importance of this vaccine. Advised may receive this vaccine at local pharmacy or Health Dept. Aware to provide a copy of the vaccination record if obtained from local pharmacy or Health Dept. Verbalized acceptance and understanding.  Pneumococcal vaccine status: Due, Education has been provided regarding the importance of this vaccine. Advised may receive this vaccine at local pharmacy or Health Dept. Aware to provide a copy of the vaccination record if obtained from local pharmacy or Health Dept. Verbalized acceptance and understanding.  Covid-19 vaccine status: Completed vaccines  Qualifies for Shingles Vaccine? Yes   Zostavax  completed No   Shingrix Completed?: No.     Education has been provided regarding the importance of this vaccine. Patient has been advised to call insurance company to determine out of pocket expense if they have not yet received this vaccine. Advised may also receive vaccine at local pharmacy or Health Dept. Verbalized acceptance and understanding.  Screening Tests Health Maintenance  Topic Date Due   Zoster Vaccines- Shingrix (1 of 2) Never done   PNA vac Low Risk Adult (1 of 2 - PCV13) Never done   COVID-19 Vaccine (4 - Booster) 08/22/2020   INFLUENZA VACCINE  10/21/2020   TETANUS/TDAP  11/21/2024   COLONOSCOPY (Pts 45-1105yrs Insurance coverage will need to be confirmed)  11/02/2027   Hepatitis C Screening  Completed   HIV Screening  Completed   HPV VACCINES  Aged Out    Health Maintenance  Health Maintenance Due  Topic Date Due   Zoster Vaccines- Shingrix (1 of 2) Never done   PNA vac Low Risk Adult (1 of 2 - PCV13) Never done   COVID-19 Vaccine (4 - Booster) 08/22/2020    Colorectal cancer screening: Type of screening: Colonoscopy. Completed 07-31-2019. Repeat every 10 years  Lung Cancer Screening: (Low Dose CT Chest recommended if Age 29-80 years, 30 pack-year currently smoking OR have quit w/in 15years.) does qualify.   Lung Cancer Screening Referral: ye  Additional Screening:  Hepatitis C Screening: does qualify  Vision Screening: Recommended annual ophthalmology exams for early detection of glaucoma and other disorders of the eye. Is the patient up to date with their annual eye exam?  Yes  Who is the provider or what is the name of the office in which the patient attends annual eye exams? [facility] If pt is not established with a provider, would they like to be referred to a provider to establish care? No .   Dental Screening: Recommended annual dental exams for proper oral hygiene  Community Resource Referral / Chronic Care Management: CRR required this visit?  No   CCM required this visit?  No       Plan:     I have personally reviewed and noted the following in the patient's chart:   Medical and social history Use of alcohol, tobacco or illicit drugs  Current medications and supplements including opioid prescriptions. Patient is currently taking opioid prescriptions. Information provided to patient regarding non-opioid alternatives. Patient advised to discuss non-opioid treatment plan with their provider. Functional ability and status Nutritional status Physical activity Advanced directives List of other physicians Hospitalizations, surgeries, and ER visits in previous 12 months Vitals Screenings to include cognitive, depression, and falls Referrals and appointments  In addition, I have reviewed and discussed with patient certain preventive protocols, quality metrics, and best practice recommendations. A written personalized care plan for preventive services as well as general preventive health recommendations were provided to patient.     Remi HaggardJulie Merric Yost, LPN   5/62/13087/02/2021   Nurse Notes:  Patient would like another referral for Lung Cancer Screening.  States he never had this done

## 2020-10-09 ENCOUNTER — Other Ambulatory Visit: Payer: Self-pay

## 2020-10-09 ENCOUNTER — Telehealth: Payer: Self-pay | Admitting: Registered Nurse

## 2020-10-09 ENCOUNTER — Encounter: Payer: Medicare Other | Attending: Physical Medicine & Rehabilitation | Admitting: Registered Nurse

## 2020-10-09 VITALS — Ht 68.0 in | Wt 192.4 lb

## 2020-10-09 DIAGNOSIS — M25552 Pain in left hip: Secondary | ICD-10-CM | POA: Insufficient documentation

## 2020-10-09 DIAGNOSIS — M7918 Myalgia, other site: Secondary | ICD-10-CM | POA: Diagnosis not present

## 2020-10-09 DIAGNOSIS — Z79891 Long term (current) use of opiate analgesic: Secondary | ICD-10-CM | POA: Insufficient documentation

## 2020-10-09 DIAGNOSIS — M47817 Spondylosis without myelopathy or radiculopathy, lumbosacral region: Secondary | ICD-10-CM | POA: Insufficient documentation

## 2020-10-09 DIAGNOSIS — Z5181 Encounter for therapeutic drug level monitoring: Secondary | ICD-10-CM | POA: Insufficient documentation

## 2020-10-09 DIAGNOSIS — G894 Chronic pain syndrome: Secondary | ICD-10-CM | POA: Diagnosis not present

## 2020-10-09 MED ORDER — HYDROCODONE-ACETAMINOPHEN 10-325 MG PO TABS: 1.0000 | ORAL_TABLET | Freq: Two times a day (BID) | ORAL | 0 refills | Status: DC | PRN

## 2020-10-09 NOTE — Telephone Encounter (Signed)
This provider placed a call to [facility] and spoke with the pharmacist. They will fill Shaun Lang Hydrocodone his last prescription was filled on 09/10/2020.

## 2020-10-09 NOTE — Telephone Encounter (Signed)
Per patient refill at [facility] not ready until the 9th per them - he said he is out now and that cannot be right -wants return call and his refill  [phone]

## 2020-10-09 NOTE — Progress Notes (Addendum)
Subjective:    Patient ID: Shaun Lang, male    DOB: 05-Aug-1955, 65 y.o.   MRN: [id number]  HPI: Shaun Lang is a 65 y.o. male whose appointment was changed to a telephone visit due to staffing. Shaun Lang agrees with telephone visit and verbalizes understanding. He  states his pain is located in his lower back and left hip. He rates his pain 6. His current exercise regime is walking and performing stretching exercises.    Shaun Lang Morphine equivalent is 15.00 MME.   Last UDS was Performed on 06/11/2020, it was consistent.   Shaun Lang CMA asked [facility] an History Questions. This provider and [place] verified we were speaking with the correct person using two identifiers.    Pain Inventory Average Pain 6 Pain Right Now 6 My pain is constant  In the last 24 hours, has pain interfered with the following? General activity 7 Relation with others 7 Enjoyment of life 7 What TIME of day is your pain at its worst? morning  Sleep (in general) Fair  Pain is worse with: inactivity Pain improves with: medication Relief from Meds: 4  Family History  Problem Relation Age of Onset   Diabetes Mother    Cancer Sister        breast   Diabetes Sister    Stroke Sister 75   Breast cancer Sister    Colon cancer Neg Hx    Esophageal cancer Neg Hx    Rectal cancer Neg Hx    Stomach cancer Neg Hx    Social History   Socioeconomic History   Marital status: Single    Spouse name: Not on file   Number of children: Not on file   Years of education: Not on file   Highest education level: Not on file  Occupational History    Comment: unemployed  Tobacco Use   Smoking status: Every Day    Packs/day: 1.00    Years: 33.00    Pack years: 33.00    Types: Cigarettes   Smokeless tobacco: Never  Vaping Use   Vaping Use: Never used  Substance and Sexual Activity   Alcohol use: Yes    Alcohol/week: 3.0 standard drinks    Types: 3 Shots of liquor per week   Drug use: No    Sexual activity: Yes  Other Topics Concern   Not on file  Social History Narrative   Not on file   Social Determinants of Health   Financial Resource Strain: Low Risk    Difficulty of Paying Living Expenses: Not hard at all  Food Insecurity: No Food Insecurity   Worried About Programme researcher, broadcasting/film/video in the Last Year: Never true   Ran Out of Food in the Last Year: Never true  Transportation Needs: No Transportation Needs   Lack of Transportation (Medical): No   Lack of Transportation (Non-Medical): No  Physical Activity: Not on file  Stress: No Stress Concern Present   Feeling of Stress : Not at all  Social Connections: Moderately Isolated   Frequency of Communication with Friends and Family: More than three times a week   Frequency of Social Gatherings with Friends and Family: More than three times a week   Attends Religious Services: 1 to 4 times per year   Active Member of [employer] or Organizations: No   Attends Banker Meetings: Never   Marital Status: Divorced   Past Surgical History:  Procedure Laterality Date   BUNIONECTOMY Left  07/15/2017   with big toe fracture repair   COLONOSCOPY  07/18/2012   polyps-Dr.Outlaw   HERNIA REPAIR  04/1998   [facility]   NECK SURGERY  07/21/02 and 01/2006   WRIST SURGERY  1992   right   Past Surgical History:  Procedure Laterality Date   BUNIONECTOMY Left 07/15/2017   with big toe fracture repair   COLONOSCOPY  07/18/2012   polyps-Dr.Outlaw   HERNIA REPAIR  04/1998   [facility]   NECK SURGERY  07/21/02 and 01/2006   WRIST SURGERY  1992   right   Past Medical History:  Diagnosis Date   Bell's palsy April 2010   Chest pain with low risk for cardiac etiology    Low Risk Myoview Sept 2013   DJD (degenerative joint disease)    disabilty secondary to neck issues   Hyperlipidemia    Hypertension    Sleep apnea    does not use CPAP   Smoker    Ht 5\' 8"  (1.727 m)   Wt 192 lb 6.4 oz (87.3 kg)   BMI 29.25 kg/m   Opioid Risk Score:    Fall Risk Score:  `1  Depression screen PHQ 2/9  Depression screen [facility] 2/9 10/01/2020 08/09/2020 06/11/2020 12/11/2019 10/10/2019 09/14/2019 07/25/2019  Decreased Interest 0 0 0 0 0 0 0  Down, Depressed, Hopeless 0 0 0 0 0 0 0  PHQ - 2 Score 0 0 0 0 0 0 0  Altered sleeping - - - - - - -  Tired, decreased energy - - - - - - -  Change in appetite - - - - - - -  Feeling bad or failure about yourself  - - - - - - -  Trouble concentrating - - - - - - -  Moving slowly or fidgety/restless - - - - - - -  Suicidal thoughts - - - - - - -  PHQ-9 Score - - - - - - -      Review of Systems  Constitutional: Negative.   HENT: Negative.    Eyes: Negative.   Respiratory: Negative.    Cardiovascular: Negative.   Gastrointestinal: Negative.   Endocrine: Negative.   Genitourinary: Negative.   Musculoskeletal:  Positive for back pain and neck pain.       Pain in both hips  Skin: Negative.   Allergic/Immunologic: Negative.   Neurological: Negative.   Hematological: Negative.   Psychiatric/Behavioral: Negative.        Objective:   Physical Exam Vitals and nursing note reviewed.  Musculoskeletal:     Comments: No Physical Exam: Telephone Visit         Assessment & Plan:  1. Chronic left inguinal pain: S/P inguinal hernia (indirect) and mesh repair. Continue to Monitor. 07/20//2022. Refilled: Hydrocodone 10/325 mg one tablet daily as needed, may take and extra tablet when pain is moderate to sever 15 days out of the month. # 45. Second script sent for the following month.  We will continue the opioid monitoring program, this consists of regular clinic visits, examinations, urine drug screen, pill counts as well as use of 11-23-1977 Controlled Substance Reporting system. A 12 month History has been reviewed on the [place] Controlled Substance Reporting System on 10/09/2020.  2. Lumbosacral Spondylosis: Continue with Exercise regime. Continue to Monitor. 08/09/2020 3. Muscle Spasm:  Continue current medication regimen with Flexeril. Continue to monitor.10/09/2020  4.Chronic Bilateral Shoulder Pain : No complaints today. R>L Chronic Pain Syndrome:  Continue  HEP as tolerated.  10/10/2019.  5. Cervicalgia/ Cervical Radiculitis: .No complaints today.Continue HEP as Tolerated. Alternate heat and Ice Therapy as tolerated. Continue current medication regimen. Continue to Monitor. 10/09/2020 6. Left Hip {Pain:Alternate with Ice and Heat Therapy. Continue to Monitor.     F/U in 2 months  Telephone Call Established Patient Location of Patient in his home Location of Provider: In the office  Total Time Spent: 10 Minutes

## 2020-12-04 ENCOUNTER — Encounter: Payer: Medicare Other | Attending: Physical Medicine & Rehabilitation | Admitting: Registered Nurse

## 2020-12-04 ENCOUNTER — Encounter: Payer: Self-pay | Admitting: Registered Nurse

## 2020-12-04 ENCOUNTER — Other Ambulatory Visit: Payer: Self-pay

## 2020-12-04 VITALS — BP 165/95 | HR 65 | Temp 97.9°F | Ht 68.0 in | Wt 185.4 lb

## 2020-12-04 DIAGNOSIS — M47817 Spondylosis without myelopathy or radiculopathy, lumbosacral region: Secondary | ICD-10-CM | POA: Diagnosis not present

## 2020-12-04 DIAGNOSIS — M7918 Myalgia, other site: Secondary | ICD-10-CM | POA: Diagnosis not present

## 2020-12-04 DIAGNOSIS — G894 Chronic pain syndrome: Secondary | ICD-10-CM | POA: Diagnosis not present

## 2020-12-04 DIAGNOSIS — Z79891 Long term (current) use of opiate analgesic: Secondary | ICD-10-CM | POA: Insufficient documentation

## 2020-12-04 DIAGNOSIS — Z5181 Encounter for therapeutic drug level monitoring: Secondary | ICD-10-CM | POA: Insufficient documentation

## 2020-12-04 MED ORDER — HYDROCODONE-ACETAMINOPHEN 10-325 MG PO TABS: 1.0000 | ORAL_TABLET | Freq: Two times a day (BID) | ORAL | 0 refills | Status: DC | PRN

## 2020-12-04 NOTE — Progress Notes (Signed)
Subjective:    Patient ID: Shaun Lang, male    DOB: 1955/06/01, 65 y.o.   MRN: [id number]  HPI: Shaun Lang is a 65 y.o. male who returns for follow up appointment for chronic pain and medication refill. He states his pain is located in his lower back. He rates his pain 5. His current exercise regime is walking and performing stretching exercises.  Mr. Slauson Morphine equivalent is 17.00 MME.   Last UDS was Performed on 06/11/2020, it was consistent.     Pain Inventory Average Pain 5 Pain Right Now 5 My pain is burning, stabbing, tingling, and aching  In the last 24 hours, has pain interfered with the following? General activity 7 Relation with others 7 Enjoyment of life 7 What TIME of day is your pain at its worst? evening and night Sleep (in general) Poor  Pain is worse with: bending, standing, and some activites Pain improves with: pacing activities and medication Relief from Meds:  na  Family History  Problem Relation Age of Onset   Diabetes Mother    Cancer Sister        breast   Diabetes Sister    Stroke Sister 6   Breast cancer Sister    Colon cancer Neg Hx    Esophageal cancer Neg Hx    Rectal cancer Neg Hx    Stomach cancer Neg Hx    Social History   Socioeconomic History   Marital status: Single    Spouse name: Not on file   Number of children: Not on file   Years of education: Not on file   Highest education level: Not on file  Occupational History    Comment: unemployed  Tobacco Use   Smoking status: Every Day    Packs/day: 1.00    Years: 33.00    Pack years: 33.00    Types: Cigarettes   Smokeless tobacco: Never  Vaping Use   Vaping Use: Never used  Substance and Sexual Activity   Alcohol use: Yes    Alcohol/week: 3.0 standard drinks    Types: 3 Shots of liquor per week   Drug use: No   Sexual activity: Yes  Other Topics Concern   Not on file  Social History Narrative   Not on file   Social Determinants of Health   Financial  Resource Strain: Low Risk    Difficulty of Paying Living Expenses: Not hard at all  Food Insecurity: No Food Insecurity   Worried About Programme researcher, broadcasting/film/video in the Last Year: Never true   Ran Out of Food in the Last Year: Never true  Transportation Needs: No Transportation Needs   Lack of Transportation (Medical): No   Lack of Transportation (Non-Medical): No  Physical Activity: Not on file  Stress: No Stress Concern Present   Feeling of Stress : Not at all  Social Connections: Moderately Isolated   Frequency of Communication with Friends and Family: More than three times a week   Frequency of Social Gatherings with Friends and Family: More than three times a week   Attends Religious Services: 1 to 4 times per year   Active Member of Clubs or Organizations: No   Attends Banker Meetings: Never   Marital Status: Divorced   Past Surgical History:  Procedure Laterality Date   BUNIONECTOMY Left 07/15/2017   with big toe fracture repair   COLONOSCOPY  07/18/2012   polyps-Dr.Outlaw   HERNIA REPAIR  04/1998   [facility]  NECK SURGERY  07/21/02 and 01/2006   WRIST SURGERY  1992   right   Past Surgical History:  Procedure Laterality Date   BUNIONECTOMY Left 07/15/2017   with big toe fracture repair   COLONOSCOPY  07/18/2012   polyps-Dr.Outlaw   HERNIA REPAIR  04/1998   [facility]   NECK SURGERY  07/21/02 and 01/2006   WRIST SURGERY  1992   right   Past Medical History:  Diagnosis Date   Bell's palsy April 2010   Chest pain with low risk for cardiac etiology    Low Risk Myoview Sept 2013   DJD (degenerative joint disease)    disabilty secondary to neck issues   Hyperlipidemia    Hypertension    Sleep apnea    does not use CPAP   Smoker    BP (!) 165/95   Pulse 65   Temp 97.9 F (36.6 C)   Ht 5\' 8"  (1.727 m)   Wt 185 lb 6.4 oz (84.1 kg)   SpO2 97%   BMI 28.19 kg/m   Opioid Risk Score:   Fall Risk Score:  `1  Depression screen PHQ 2/9  Depression screen [facility] 2/9  12/04/2020 10/09/2020 10/01/2020 08/09/2020 06/11/2020 12/11/2019 10/10/2019  Decreased Interest 0 0 0 0 0 0 0  Down, Depressed, Hopeless 0 0 0 0 0 0 0  PHQ - 2 Score 0 0 0 0 0 0 0  Altered sleeping - - - - - - -  Tired, decreased energy - - - - - - -  Change in appetite - - - - - - -  Feeling bad or failure about yourself  - - - - - - -  Trouble concentrating - - - - - - -  Moving slowly or fidgety/restless - - - - - - -  Suicidal thoughts - - - - - - -  PHQ-9 Score - - - - - - -     Review of Systems  Constitutional: Negative.   HENT: Negative.    Eyes: Negative.   Respiratory: Negative.    Cardiovascular: Negative.   Gastrointestinal: Negative.   Endocrine: Negative.   Genitourinary: Negative.   Musculoskeletal:  Positive for back pain and neck pain.  Skin: Negative.   Allergic/Immunologic: Negative.   Neurological: Negative.   Hematological: Negative.   Psychiatric/Behavioral: Negative.    All other systems reviewed and are negative.     Objective:   Physical Exam Vitals and nursing note reviewed.  Constitutional:      Appearance: Normal appearance.  Cardiovascular:     Rate and Rhythm: Normal rate and regular rhythm.  Musculoskeletal:        General: No swelling.     Cervical back: Normal range of motion and neck supple.     Comments: Normal Muscle Bulk and Muscle Testing Reveals:  Upper Extremities: Full ROM and Muscle Strength 5/5 Lower Extremities: Full ROM and Muscle Strength 5/5 Arises from Table with ease Narrow Based Gait     Neurological:     Mental Status: He is alert.         Assessment & Plan:  1. Chronic left inguinal pain: S/P inguinal hernia (indirect) and mesh repair. Continue to Monitor. 09/14//2022. Refilled: Hydrocodone 10/325 mg one tablet daily as needed, may take and extra tablet when pain is moderate to sever 15 days out of the month. # 45. Second script sent for the following month.  We will continue the opioid monitoring program, this  consists  of regular clinic visits, examinations, urine drug screen, pill counts as well as use of [place] Controlled Substance Reporting system. A 12 month History has been reviewed on the [place] Controlled Substance Reporting System on 12/04/2020.  2. Lumbosacral Spondylosis: Continue with Exercise regime. Continue to Monitor. 12/04/2020 3. Muscle Spasm: Continue current medication regimen with Flexeril. Continue to monitor.12/04/2020  4.Chronic Bilateral Shoulder Pain : No complaints today. R>L Chronic Pain Syndrome:  Continue HEP as tolerated.  12/05/2019.  5. Cervicalgia/ Cervical Radiculitis: .No complaints today.Continue HEP as Tolerated. Alternate heat and Ice Therapy as tolerated. Continue current medication regimen. Continue to Monitor. 12/04/2020 6. Left Hip Pain:No Complaints today. Alternate with Ice and Heat Therapy. Continue to Monitor.     F/U in 2 months

## 2020-12-06 IMAGING — CT CT CHEST LUNG CANCER SCREENING LOW DOSE W/O CM
1 series · 15 of 31 positions shown, 19 images · non-contrast
Comparison: None.

CLINICAL DATA: Lung cancer screening. Thirty-three pack-year
history. Current asymptomatic smoker.

EXAM:
CT CHEST WITHOUT CONTRAST LOW-DOSE FOR LUNG CANCER SCREENING
TECHNIQUE: Multidetector CT imaging of the chest was performed following the
standard protocol without IV contrast.

[Series 2: ldct screening <30 bmi · axial · 0.72mm/px · z∈[-88,+192]mm · 15 of 62 slices shown, 19 images]
[im 3/62  mediastinal]
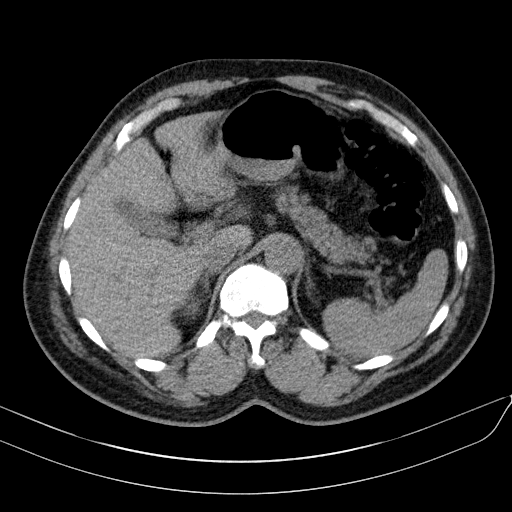
[im 3/62  lung]
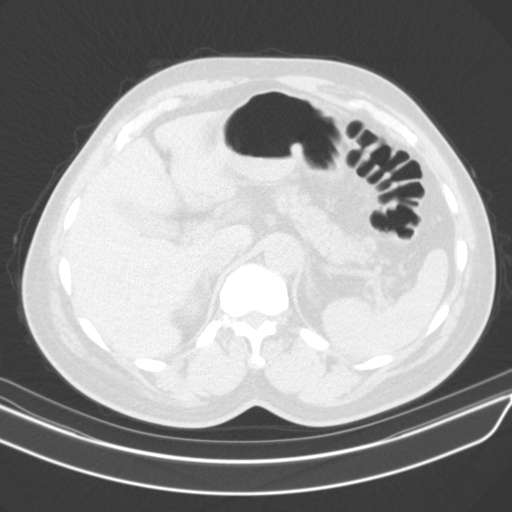
[im 7/62  lung]
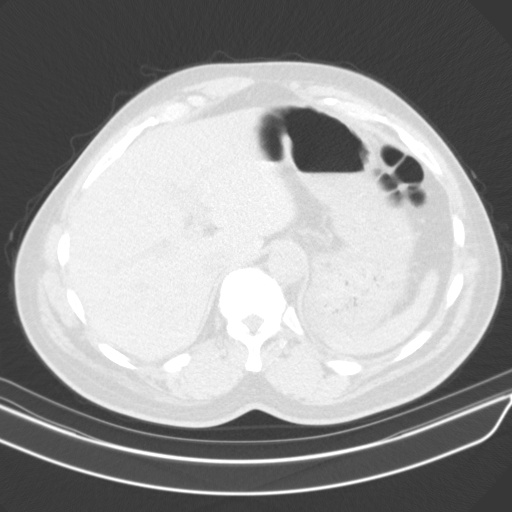
[im 12/62  lung]
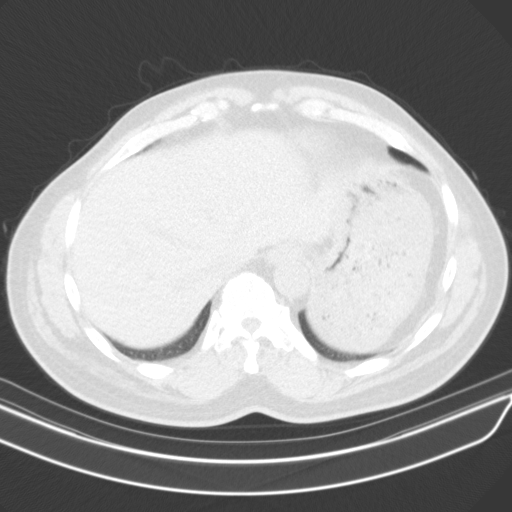
[im 14/62  lung]
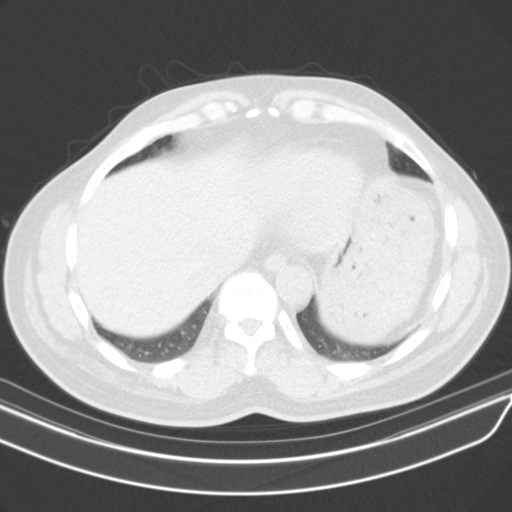
[im 19/62  mediastinal]
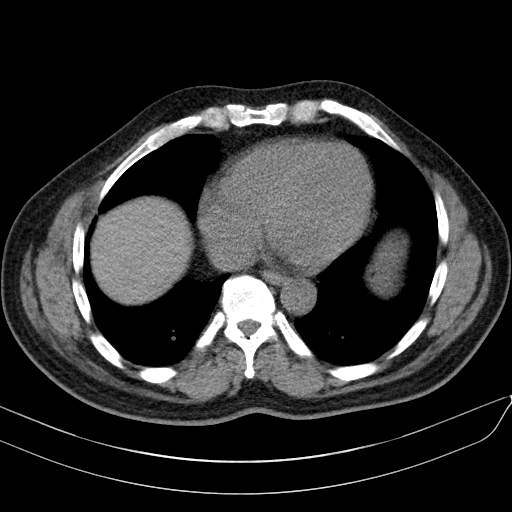
[im 19/62  lung]
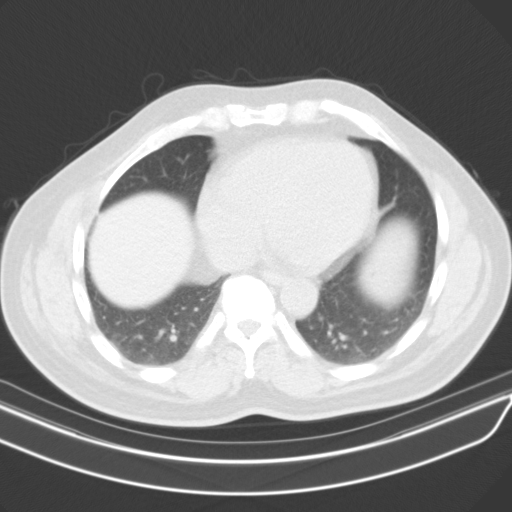
[im 23/62  lung]
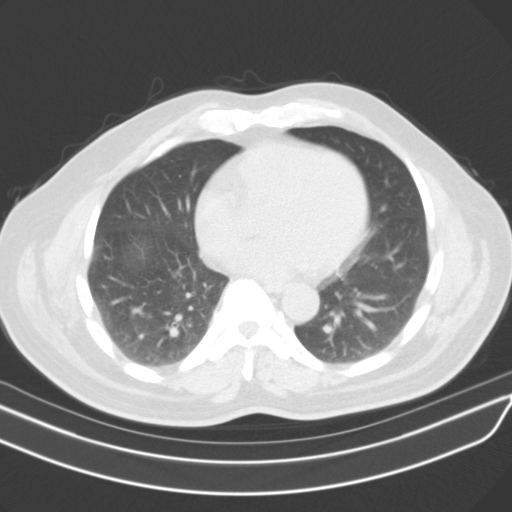
[im 28/62  lung]
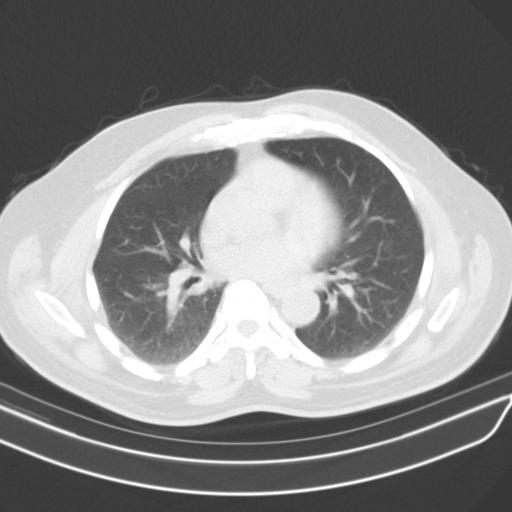
[im 32/62  lung]
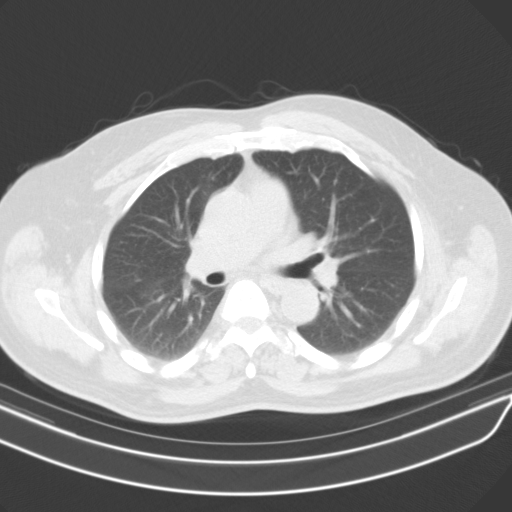
[im 34/62  mediastinal]
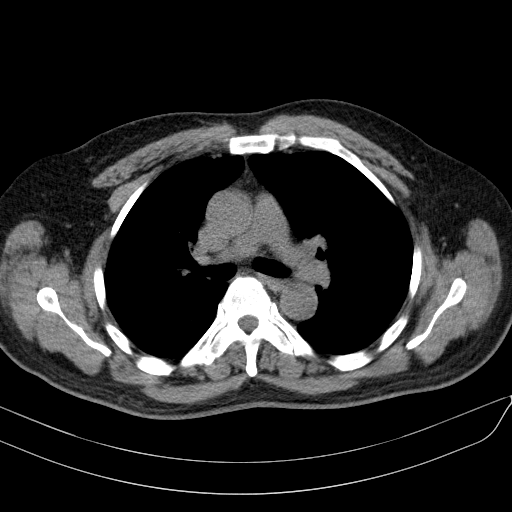
[im 34/62  lung]
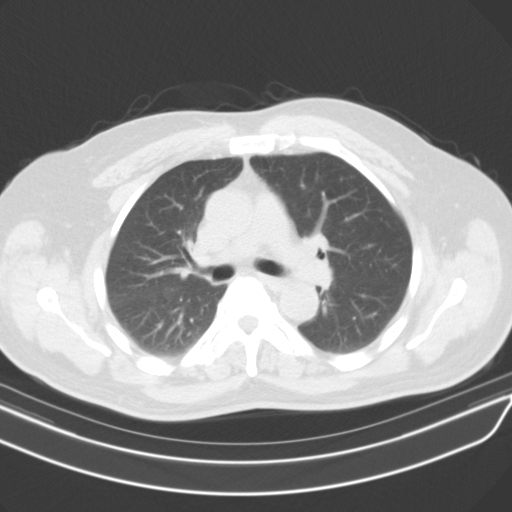
[im 39/62  lung]
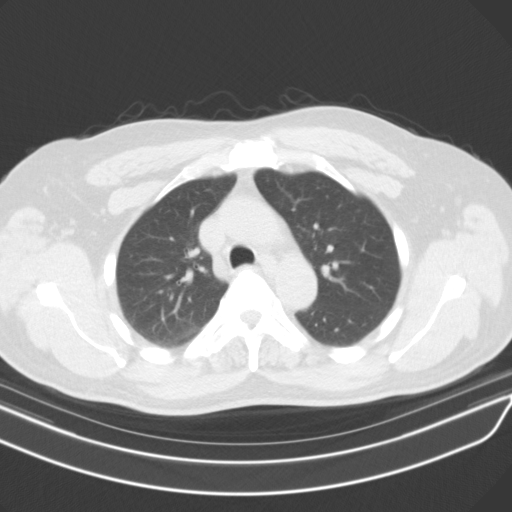
[im 43/62  lung]
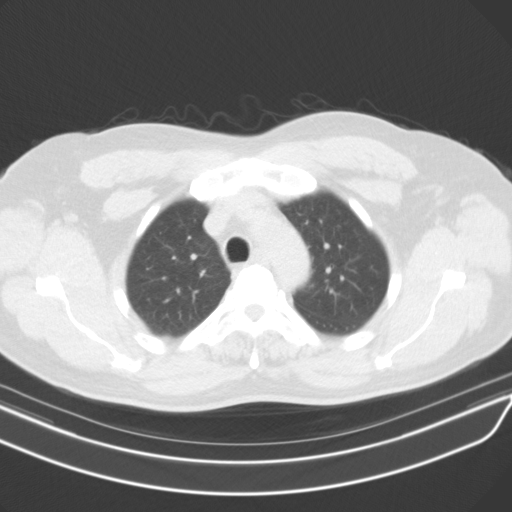
[im 48/62  lung]
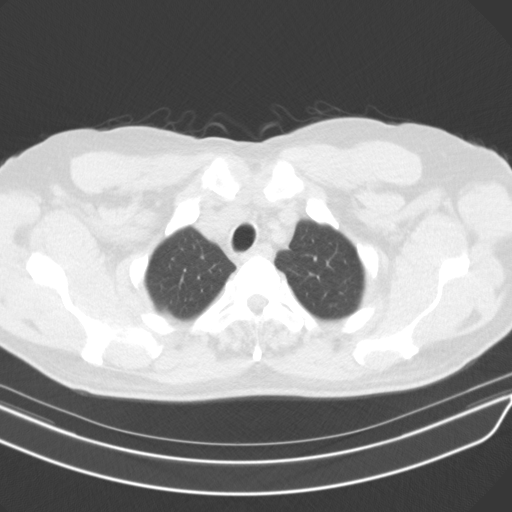
[im 50/62  mediastinal]
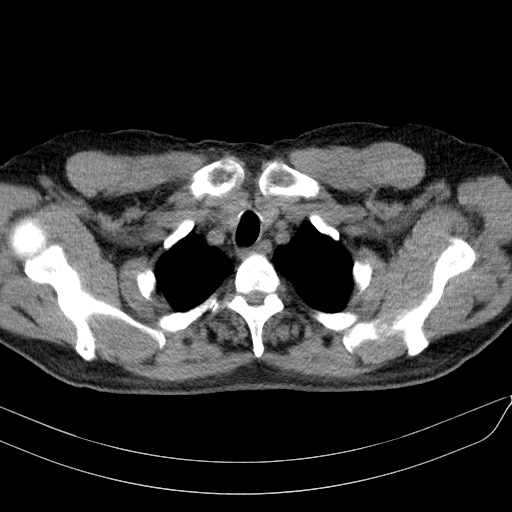
[im 50/62  lung]
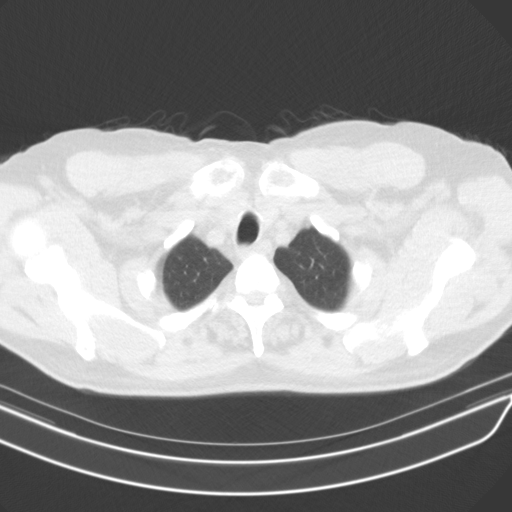
[im 55/62  lung]
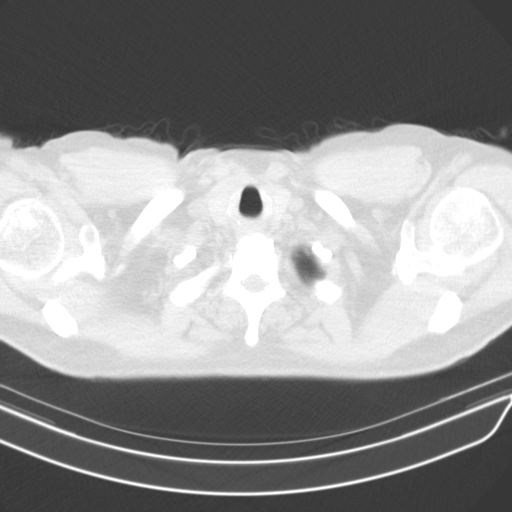
[im 59/62  lung]
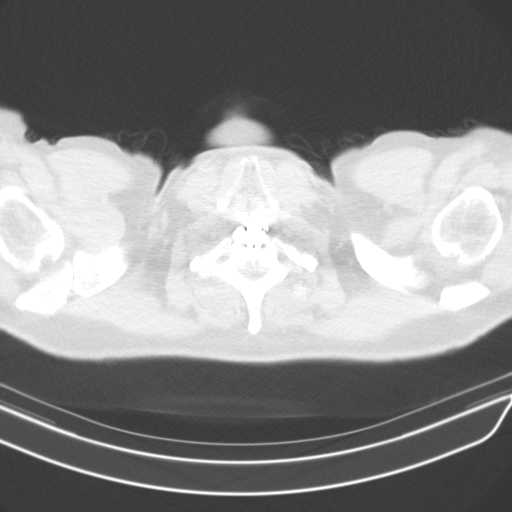

[15 of 31 positions shown; findings below may reference images not displayed]

FINDINGS: Cardiovascular: The heart size appears within normal limits. No
pericardial effusion identified.

Mediastinum/Nodes: No enlarged mediastinal, hilar, or axillary lymph
nodes. Thyroid gland, trachea, and esophagus demonstrate no
significant findings.

Lungs/Pleura: No pleural effusion, airspace consolidation or
atelectasis. Mild centrilobular and paraseptal emphysema. No
airspace consolidation, atelectasis, or pneumothorax. No suspicious
pulmonary nodule or mass identified.

Upper Abdomen: No acute abnormality.

Musculoskeletal: No chest wall mass or suspicious bone lesions
identified. Healed distal body of sternum fracture.
IMPRESSION: 1. Lung-RADS 1, negative. Continue annual screening with low-dose
chest CT without contrast in 12 months.
2. Emphysema.

Emphysema ([id number]).

## 2021-02-05 ENCOUNTER — Ambulatory Visit
Admission: RE | Admit: 2021-02-05 | Discharge: 2021-02-05 | Disposition: A | Discharge status: Home / Self Care | Payer: Medicare Other | Source: Ambulatory Visit | Attending: Registered Nurse | Admitting: Registered Nurse

## 2021-02-05 ENCOUNTER — Encounter: Payer: Medicare Other | Attending: Physical Medicine & Rehabilitation | Admitting: Registered Nurse

## 2021-02-05 ENCOUNTER — Other Ambulatory Visit: Payer: Self-pay

## 2021-02-05 ENCOUNTER — Encounter: Payer: Self-pay | Admitting: Registered Nurse

## 2021-02-05 VITALS — BP 150/85 | HR 59 | Temp 98.7°F | Ht 68.0 in | Wt 192.4 lb

## 2021-02-05 DIAGNOSIS — G8929 Other chronic pain: Secondary | ICD-10-CM | POA: Diagnosis not present

## 2021-02-05 DIAGNOSIS — M47817 Spondylosis without myelopathy or radiculopathy, lumbosacral region: Secondary | ICD-10-CM

## 2021-02-05 DIAGNOSIS — M7918 Myalgia, other site: Secondary | ICD-10-CM | POA: Diagnosis not present

## 2021-02-05 DIAGNOSIS — Z79891 Long term (current) use of opiate analgesic: Secondary | ICD-10-CM

## 2021-02-05 DIAGNOSIS — G894 Chronic pain syndrome: Secondary | ICD-10-CM | POA: Diagnosis not present

## 2021-02-05 DIAGNOSIS — M25511 Pain in right shoulder: Secondary | ICD-10-CM | POA: Diagnosis not present

## 2021-02-05 DIAGNOSIS — R1032 Left lower quadrant pain: Secondary | ICD-10-CM

## 2021-02-05 DIAGNOSIS — M25552 Pain in left hip: Secondary | ICD-10-CM

## 2021-02-05 DIAGNOSIS — Z5181 Encounter for therapeutic drug level monitoring: Secondary | ICD-10-CM | POA: Diagnosis not present

## 2021-02-05 MED ORDER — HYDROCODONE-ACETAMINOPHEN 10-325 MG PO TABS: 1.0000 | ORAL_TABLET | Freq: Two times a day (BID) | ORAL | 0 refills | Status: DC | PRN

## 2021-02-05 MED ORDER — CYCLOBENZAPRINE HCL 10 MG PO TABS: ORAL_TABLET | ORAL | 2 refills | Status: DC

## 2021-02-05 MED ORDER — GABAPENTIN 300 MG PO CAPS: 300.0000 mg | ORAL_CAPSULE | Freq: Two times a day (BID) | ORAL | 2 refills | Status: DC

## 2021-02-05 NOTE — Progress Notes (Signed)
Subjective:    Patient ID: Shaun Lang, male    DOB: 1955/08/23, 65 y.o.   MRN: [id number]  HPI: Shaun Lang is a 65 y.o. male who returns for follow up appointment for chronic pain and medication refill. He states his pain is located in his right shoulder, lower back and left hip pain, he denies falling. He reports increase intensity of pain in his lower back and left hip and only receiving 4 hours of relief of his pain with his current medication regimen. Will order a left hip x-ray today and increase his hydrocodone tablets, he verbalizes understanding. He rates his pain 8. His current exercise regime is walking and performing stretching exercises.  Mr. Ricketson Morphine equivalent is 20.00 MME.   UDS ordered today.      Pain Inventory Average Pain 7 Pain Right Now 8 My pain is sharp, aching, and throbbing  In the last 24 hours, has pain interfered with the following? General activity 7 Relation with others 9 Enjoyment of life 6 What TIME of day is your pain at its worst? evening and night Sleep (in general) Poor  Pain is worse with: bending, standing, and some activites Pain improves with: pacing activities and medication Relief from Meds: 6  Family History  Problem Relation Age of Onset   Diabetes Mother    Cancer Sister        breast   Diabetes Sister    Stroke Sister 28   Breast cancer Sister    Colon cancer Neg Hx    Esophageal cancer Neg Hx    Rectal cancer Neg Hx    Stomach cancer Neg Hx    Social History   Socioeconomic History   Marital status: Single    Spouse name: Not on file   Number of children: Not on file   Years of education: Not on file   Highest education level: Not on file  Occupational History    Comment: unemployed  Tobacco Use   Smoking status: Every Day    Packs/day: 1.00    Years: 33.00    Pack years: 33.00    Types: Cigarettes   Smokeless tobacco: Never  Vaping Use   Vaping Use: Never used  Substance and Sexual Activity    Alcohol use: Yes    Alcohol/week: 3.0 standard drinks    Types: 3 Shots of liquor per week   Drug use: No   Sexual activity: Yes  Other Topics Concern   Not on file  Social History Narrative   Not on file   Social Determinants of Health   Financial Resource Strain: Low Risk    Difficulty of Paying Living Expenses: Not hard at all  Food Insecurity: No Food Insecurity   Worried About Programme researcher, broadcasting/film/video in the Last Year: Never true   Ran Out of Food in the Last Year: Never true  Transportation Needs: No Transportation Needs   Lack of Transportation (Medical): No   Lack of Transportation (Non-Medical): No  Physical Activity: Not on file  Stress: No Stress Concern Present   Feeling of Stress : Not at all  Social Connections: Moderately Isolated   Frequency of Communication with Friends and Family: More than three times a week   Frequency of Social Gatherings with Friends and Family: More than three times a week   Attends Religious Services: 1 to 4 times per year   Active Member of [employer] or Organizations: No   Attends Banker Meetings:  Never   Marital Status: Divorced   Past Surgical History:  Procedure Laterality Date   BUNIONECTOMY Left 07/15/2017   with big toe fracture repair   COLONOSCOPY  07/18/2012   polyps-Dr.Outlaw   HERNIA REPAIR  04/1998   [facility]   NECK SURGERY  07/21/02 and 01/2006   WRIST SURGERY  1992   right   Past Surgical History:  Procedure Laterality Date   BUNIONECTOMY Left 07/15/2017   with big toe fracture repair   COLONOSCOPY  07/18/2012   polyps-Dr.Outlaw   HERNIA REPAIR  04/1998   [facility]   NECK SURGERY  07/21/02 and 01/2006   WRIST SURGERY  1992   right   Past Medical History:  Diagnosis Date   Bell's palsy April 2010   Chest pain with low risk for cardiac etiology    Low Risk Myoview Sept 2013   DJD (degenerative joint disease)    disabilty secondary to neck issues   Hyperlipidemia    Hypertension    Sleep apnea    does not use  CPAP   Smoker    Ht 5\' 8"  (1.727 m)   Wt 192 lb 6.4 oz (87.3 kg)   BMI 29.25 kg/m   Opioid Risk Score:   Fall Risk Score:  `1  Depression screen PHQ 2/9  Depression screen [facility] 2/9 02/05/2021 12/04/2020 10/09/2020 10/01/2020 08/09/2020 06/11/2020 12/11/2019  Decreased Interest 0 0 0 0 0 0 0  Down, Depressed, Hopeless 0 0 0 0 0 0 0  PHQ - 2 Score 0 0 0 0 0 0 0  Altered sleeping - - - - - - -  Tired, decreased energy - - - - - - -  Change in appetite - - - - - - -  Feeling bad or failure about yourself  - - - - - - -  Trouble concentrating - - - - - - -  Moving slowly or fidgety/restless - - - - - - -  Suicidal thoughts - - - - - - -  PHQ-9 Score - - - - - - -     Review of Systems  Constitutional: Negative.   HENT: Negative.    Eyes: Negative.   Respiratory: Negative.    Cardiovascular: Negative.   Gastrointestinal: Negative.   Endocrine: Negative.   Genitourinary: Negative.   Musculoskeletal:  Positive for back pain.  Skin: Negative.   Allergic/Immunologic: Negative.   Neurological: Negative.   Hematological: Negative.   Psychiatric/Behavioral:  Positive for sleep disturbance.       Objective:   Physical Exam Vitals and nursing note reviewed.  Constitutional:      Appearance: Normal appearance.  Cardiovascular:     Rate and Rhythm: Normal rate and regular rhythm.     Pulses: Normal pulses.     Heart sounds: Normal heart sounds.  Pulmonary:     Effort: Pulmonary effort is normal.     Breath sounds: Normal breath sounds.  Musculoskeletal:     Cervical back: Normal range of motion and neck supple.     Comments: Normal Muscle Bulk and Muscle Testing Reveals:  Upper Extremities: Full ROM and Muscle Strength 5/5 Left Greater Trochanter Tenderness Lower Extremities: Full ROM and Muscle Strength 5/5 Left Lower Extremity Flexion Produces Pain into his Left Hip Arises from chair with ease Narrow Based  Gait     Skin:    General: Skin is warm and dry.  Neurological:      Mental Status: He is alert and oriented  to person, place, and time.  Psychiatric:        Mood and Affect: Mood normal.        Behavior: Behavior normal.         Assessment & Plan:  1. Chronic left inguinal pain: S/P inguinal hernia (indirect) and mesh repair. Continue to Monitor. 11/16//2022. Refilled: Increased Hydrocodone 10/325 mg one tablet daily as needed, may take and extra tablet when pain is moderate to sever 15 days out of the month. # 55. Second script sent for the following month.  We will continue the opioid monitoring program, this consists of regular clinic visits, examinations, urine drug screen, pill counts as well as use of [place] Controlled Substance Reporting system. A 12 month History has been reviewed on the [place] Controlled Substance Reporting System on 02/05/2021.  2. Lumbosacral Spondylosis: Continue with Exercise regime. Continue to Monitor. 02/05/2021 3. Muscle Spasm: Continue current medication regimen with Flexeril. Continue to monitor.02/05/2021  4.Chronic Right Shoulder Pain : Chronic Pain Syndrome:  Continue HEP as tolerated.  Continue to Monitor. 02/06/2020.  5. Cervicalgia/ Cervical Radiculitis: .No complaints today.Continue HEP as Tolerated. Alternate heat and Ice Therapy as tolerated. Continue current medication regimen. Continue to Monitor. 02/05/2021 6. Left Hip Pain: RX: X-Ray  Alternate with Ice and Heat Therapy. Continue to Monitor.  7. Right Shoulder Pain: Continue with HEP as tolerated and alternate with Ice  and Heat  [place]   F/U in 2 months

## 2021-02-13 LAB — TOXASSURE SELECT,+ANTIDEPR,UR

## 2021-02-19 ENCOUNTER — Telehealth: Payer: Self-pay | Admitting: *Deleted

## 2021-02-19 NOTE — Telephone Encounter (Signed)
Urine drug screen for this encounter is consistent for prescribed medication 

## 2021-03-07 ENCOUNTER — Telehealth: Payer: Self-pay

## 2021-03-07 NOTE — Telephone Encounter (Signed)
Patient called and stated the [facility] on [address] does not have the Hydrocodone. He wants to know if it can be sent to [facility] on [address]. I have called the [facility] to cancel the prescription there.

## 2021-03-08 MED ORDER — HYDROCODONE-ACETAMINOPHEN 10-325 MG PO TABS: 1.0000 | ORAL_TABLET | Freq: Two times a day (BID) | ORAL | 0 refills | Status: DC | PRN

## 2021-03-08 NOTE — Addendum Note (Signed)
Addended by: Jones Bales on: 03/08/2021 11:50 AM   Modules accepted: Orders

## 2021-03-08 NOTE — Telephone Encounter (Signed)
PMP was Reviewed. Hydrocodone e-scribed. 

## 2021-04-03 ENCOUNTER — Encounter: Payer: Self-pay | Admitting: Registered Nurse

## 2021-04-03 ENCOUNTER — Other Ambulatory Visit: Payer: Self-pay

## 2021-04-03 ENCOUNTER — Encounter: Payer: Commercial Managed Care - HMO | Attending: Registered Nurse | Admitting: Registered Nurse

## 2021-04-03 VITALS — BP 158/92 | HR 67 | Temp 98.5°F | Ht 68.0 in | Wt 191.0 lb

## 2021-04-03 DIAGNOSIS — Z79891 Long term (current) use of opiate analgesic: Secondary | ICD-10-CM | POA: Insufficient documentation

## 2021-04-03 DIAGNOSIS — Z5181 Encounter for therapeutic drug level monitoring: Secondary | ICD-10-CM | POA: Insufficient documentation

## 2021-04-03 DIAGNOSIS — M47817 Spondylosis without myelopathy or radiculopathy, lumbosacral region: Secondary | ICD-10-CM | POA: Insufficient documentation

## 2021-04-03 DIAGNOSIS — M7062 Trochanteric bursitis, left hip: Secondary | ICD-10-CM | POA: Diagnosis not present

## 2021-04-03 DIAGNOSIS — G894 Chronic pain syndrome: Secondary | ICD-10-CM | POA: Insufficient documentation

## 2021-04-03 MED ORDER — HYDROCODONE-ACETAMINOPHEN 10-325 MG PO TABS: 1.0000 | ORAL_TABLET | Freq: Two times a day (BID) | ORAL | 0 refills | Status: DC | PRN

## 2021-04-03 NOTE — Progress Notes (Signed)
Subjective:    Patient ID: Shaun Lang, male    DOB: 11/26/1955, 66 y.o.   MRN: [id number]  HPI: Shaun Lang is a 66 y.o. male who returns for follow up appointment for chronic pain and medication refill. He states his pain is located in his lower back and left hip pain. He rates his pain 7. His current exercise regime is walking and he was encouraged to increase HEP as tolerated and verbalizes understanding.   Shaun Lang Morphine equivalent is 20.37  MME.   Last UDS was Performed on 02/05/2021, it was consistent.      Pain Inventory Average Pain 7 Pain Right Now 7 My pain is intermittent, tingling, and aching  In the last 24 hours, has pain interfered with the following? General activity 8 Relation with others 9 Enjoyment of life 7 What TIME of day is your pain at its worst? evening Sleep (in general) Poor  Pain is worse with: walking, bending, sitting, and standing Pain improves with: medication Relief from Meds: 7  Family History  Problem Relation Age of Onset   Diabetes Mother    Cancer Sister        breast   Diabetes Sister    Stroke Sister 44   Breast cancer Sister    Colon cancer Neg Hx    Esophageal cancer Neg Hx    Rectal cancer Neg Hx    Stomach cancer Neg Hx    Social History   Socioeconomic History   Marital status: Single    Spouse name: Not on file   Number of children: Not on file   Years of education: Not on file   Highest education level: Not on file  Occupational History    Comment: unemployed  Tobacco Use   Smoking status: Every Day    Packs/day: 1.00    Years: 33.00    Pack years: 33.00    Types: Cigarettes   Smokeless tobacco: Never  Vaping Use   Vaping Use: Never used  Substance and Sexual Activity   Alcohol use: Yes    Alcohol/week: 3.0 standard drinks    Types: 3 Shots of liquor per week   Drug use: No   Sexual activity: Yes  Other Topics Concern   Not on file  Social History Narrative   Not on file   Social  Determinants of Health   Financial Resource Strain: Low Risk    Difficulty of Paying Living Expenses: Not hard at all  Food Insecurity: No Food Insecurity   Worried About Charity fundraiser in the Last Year: Never true   [place] in the Last Year: Never true  Transportation Needs: No Transportation Needs   Lack of Transportation (Medical): No   Lack of Transportation (Non-Medical): No  Physical Activity: Not on file  Stress: No Stress Concern Present   Feeling of Stress : Not at all  Social Connections: Moderately Isolated   Frequency of Communication with Friends and Family: More than three times a week   Frequency of Social Gatherings with Friends and Family: More than three times a week   Attends Religious Services: 1 to 4 times per year   Active Member of [employer] or Organizations: No   Attends Archivist Meetings: Never   Marital Status: Divorced   Past Surgical History:  Procedure Laterality Date   BUNIONECTOMY Left 07/15/2017   with big toe fracture repair   COLONOSCOPY  07/18/2012   polyps-Dr.Outlaw  HERNIA REPAIR  04/1998   [facility]   NECK SURGERY  07/21/02 and 01/2006   WRIST SURGERY  1992   right   Past Surgical History:  Procedure Laterality Date   BUNIONECTOMY Left 07/15/2017   with big toe fracture repair   COLONOSCOPY  07/18/2012   polyps-Dr.Outlaw   HERNIA REPAIR  04/1998   [facility]   NECK SURGERY  07/21/02 and 01/2006   WRIST SURGERY  1992   right   Past Medical History:  Diagnosis Date   Bell's palsy April 2010   Chest pain with low risk for cardiac etiology    Low Risk Myoview Sept 2013   DJD (degenerative joint disease)    disabilty secondary to neck issues   Hyperlipidemia    Hypertension    Sleep apnea    does not use CPAP   Smoker    BP (!) 158/92    Pulse 67    Temp 98.5 F (36.9 C)    Ht 5\' 8"  (1.727 m)    Wt 191 lb (86.6 kg)    SpO2 98%    BMI 29.04 kg/m   Opioid Risk Score:   Fall Risk Score:  `1  Depression screen PHQ  2/9  Depression screen [facility] 2/9 04/03/2021 02/05/2021 12/04/2020 10/09/2020 10/01/2020 08/09/2020 06/11/2020  Decreased Interest 0 0 0 0 0 0 0  Down, Depressed, Hopeless 0 0 0 0 0 0 0  PHQ - 2 Score 0 0 0 0 0 0 0  Altered sleeping - - - - - - -  Tired, decreased energy - - - - - - -  Change in appetite - - - - - - -  Feeling bad or failure about yourself  - - - - - - -  Trouble concentrating - - - - - - -  Moving slowly or fidgety/restless - - - - - - -  Suicidal thoughts - - - - - - -  PHQ-9 Score - - - - - - -    Review of Systems  Musculoskeletal:  Positive for back pain and neck pain.       Left groin pain & pain across the shoulders   All other systems reviewed and are negative.     Objective:   Physical Exam Vitals and nursing note reviewed.  Constitutional:      Appearance: Normal appearance.  Cardiovascular:     Rate and Rhythm: Normal rate and regular rhythm.  Pulmonary:     Effort: Pulmonary effort is normal.     Breath sounds: Normal breath sounds.  Musculoskeletal:     Cervical back: Normal range of motion and neck supple.     Comments: Normal Muscle Bulk and Muscle Testing Reveals:  Upper Extremities: Full ROM and Muscle Strength 5/5 Left: Greater Trochanter Tenderness   Lower Extremities: Full ROM and Muscle Strength 5/5 Arises from Table with Ease  Narrow Based  Gait     Skin:    General: Skin is warm and dry.  Neurological:     Mental Status: He is alert and oriented to person, place, and time.  Psychiatric:        Mood and Affect: Mood normal.        Behavior: Behavior normal.         Assessment & Plan:  1. Chronic left inguinal pain: S/P inguinal hernia (indirect) and mesh repair. Continue to Monitor. 01/12//2023. Refilled: Hydrocodone 10/325 mg one tablet daily as needed, may take and extra tablet  when pain is moderate to sever 15 days out of the month. # 2. Second script sent for the following month.  We will continue the opioid monitoring program,  this consists of regular clinic visits, examinations, urine drug screen, pill counts as well as use of [place] Controlled Substance Reporting system. A 12 month History has been reviewed on the [place] on 04/03/2021.  2. Lumbosacral Spondylosis: Continue with Exercise regime. Continue to Monitor. 04/03/2021 3. Muscle Spasm: Continue current medication regimen with Flexeril. Continue to monitor.04/03/2021  4.Chronic Right Shoulder Pain : Chronic Pain Syndrome:  Continue HEP as tolerated.  Continue to Monitor. 04/03/2021.  5. Cervicalgia/ Cervical Radiculitis: .No complaints today.Continue HEP as Tolerated. Alternate heat and Ice Therapy as tolerated. Continue current medication regimen. Continue to Monitor. 04/03/2021 6. Left Hip Pain: Reviewed Left Hip Xray- He verbalizes understanding.   Alternate with Ice and Heat Therapy. Continue to Monitor. 04/03/2021 7. Right Shoulder Pain: No Complaints Today. Continue with HEP as tolerated and alternate with Ice  and Heat  [place]  F/U in 2 months

## 2021-05-28 ENCOUNTER — Other Ambulatory Visit: Payer: Self-pay

## 2021-05-28 ENCOUNTER — Encounter: Payer: Medicare Other | Attending: Physical Medicine & Rehabilitation | Admitting: Registered Nurse

## 2021-05-28 ENCOUNTER — Encounter: Payer: Self-pay | Admitting: Registered Nurse

## 2021-05-28 VITALS — BP 158/94 | HR 62 | Temp 98.0°F | Ht 68.0 in | Wt 191.2 lb

## 2021-05-28 DIAGNOSIS — M7062 Trochanteric bursitis, left hip: Secondary | ICD-10-CM | POA: Insufficient documentation

## 2021-05-28 DIAGNOSIS — G894 Chronic pain syndrome: Secondary | ICD-10-CM | POA: Diagnosis not present

## 2021-05-28 DIAGNOSIS — M47817 Spondylosis without myelopathy or radiculopathy, lumbosacral region: Secondary | ICD-10-CM | POA: Insufficient documentation

## 2021-05-28 DIAGNOSIS — M7918 Myalgia, other site: Secondary | ICD-10-CM | POA: Insufficient documentation

## 2021-05-28 DIAGNOSIS — Z79891 Long term (current) use of opiate analgesic: Secondary | ICD-10-CM | POA: Diagnosis not present

## 2021-05-28 DIAGNOSIS — Z5181 Encounter for therapeutic drug level monitoring: Secondary | ICD-10-CM | POA: Diagnosis not present

## 2021-05-28 MED ORDER — HYDROCODONE-ACETAMINOPHEN 10-325 MG PO TABS: 1.0000 | ORAL_TABLET | Freq: Two times a day (BID) | ORAL | 0 refills | Status: DC | PRN

## 2021-05-28 NOTE — Progress Notes (Signed)
? ?Subjective:  ? ? Patient ID: Shaun Lang, male    DOB: Apr 10, 1955, 66 y.o.   MRN: [id number] ? ?HPI: Shaun Lang is a 66 y.o. male who returns for follow up appointment for chronic pain and medication refill. He states his pain is located in his lower back and left hip. He rates his pain 8. His current exercise regime is walking, Shaun Lang was instructed to increase his HEP as tolerated, he verbaliazes understanding.  ? ?Shaun Lang Morphine equivalent is 20.37 MME.   Last UDS was Performed on 02/05/2022, it was consistent.  ?  ? ?Pain Inventory ?Average Pain 7 ?Pain Right Now 8 ?My pain is sharp, burning, stabbing, and aching ? ?In the last 24 hours, has pain interfered with the following? ?General activity 7 ?Relation with others 8 ?Enjoyment of life 8 ?What TIME of day is your pain at its worst? morning  and evening ?Sleep (in general) Poor ? ?Pain is worse with: bending, sitting, and some activites ?Pain improves with: therapy/exercise and medication ?Relief from Meds: 7 ? ?Family History  ?Problem Relation Age of Onset  ? Diabetes Mother   ? Cancer Sister   ?     breast  ? Diabetes Sister   ? Stroke Sister 78  ? Breast cancer Sister   ? Colon cancer Neg Hx   ? Esophageal cancer Neg Hx   ? Rectal cancer Neg Hx   ? Stomach cancer Neg Hx   ? ?Social History  ? ?Socioeconomic History  ? Marital status: Single  ?  Spouse name: Not on file  ? Number of children: Not on file  ? Years of education: Not on file  ? Highest education level: Not on file  ?Occupational History  ?  Comment: unemployed  ?Tobacco Use  ? Smoking status: Every Day  ?  Packs/day: 1.00  ?  Years: 33.00  ?  Pack years: 33.00  ?  Types: Cigarettes  ? Smokeless tobacco: Never  ?Vaping Use  ? Vaping Use: Never used  ?Substance and Sexual Activity  ? Alcohol use: Yes  ?  Alcohol/week: 3.0 standard drinks  ?  Types: 3 Shots of liquor per week  ? Drug use: No  ? Sexual activity: Yes  ?Other Topics Concern  ? Not on file  ?Social History Narrative   ? Not on file  ? ?Social Determinants of Health  ? ?Financial Resource Strain: Low Risk   ? Difficulty of Paying Living Expenses: Not hard at all  ?Food Insecurity: No Food Insecurity  ? Worried About Programme researcher, broadcasting/film/video in the Last Year: Never true  ? Ran Out of Food in the Last Year: Never true  ?Transportation Needs: No Transportation Needs  ? Lack of Transportation (Medical): No  ? Lack of Transportation (Non-Medical): No  ?Physical Activity: Not on file  ?Stress: No Stress Concern Present  ? Feeling of Stress : Not at all  ?Social Connections: Moderately Isolated  ? Frequency of Communication with Friends and Family: More than three times a week  ? Frequency of Social Gatherings with Friends and Family: More than three times a week  ? Attends Religious Services: 1 to 4 times per year  ? Active Member of Clubs or Organizations: No  ? Attends Banker Meetings: Never  ? Marital Status: Divorced  ? ?Past Surgical History:  ?Procedure Laterality Date  ? BUNIONECTOMY Left 07/15/2017  ? with big toe fracture repair  ? COLONOSCOPY  07/18/2012  ?  polyps-Dr.Outlaw  ? HERNIA REPAIR  04/1998  ? [facility]  ? NECK SURGERY  07/21/02 and 01/2006  ? WRIST SURGERY  1992  ? right  ? ?Past Surgical History:  ?Procedure Laterality Date  ? BUNIONECTOMY Left 07/15/2017  ? with big toe fracture repair  ? COLONOSCOPY  07/18/2012  ? polyps-Dr.Outlaw  ? HERNIA REPAIR  04/1998  ? [facility]  ? NECK SURGERY  07/21/02 and 01/2006  ? WRIST SURGERY  1992  ? right  ? ?Past Medical History:  ?Diagnosis Date  ? Bell's palsy April 2010  ? Chest pain with low risk for cardiac etiology   ? Low Risk Myoview Sept 2013  ? DJD (degenerative joint disease)   ? disabilty secondary to neck issues  ? Hyperlipidemia   ? Hypertension   ? Sleep apnea   ? does not use CPAP  ? Smoker   ? ?BP (!) 158/94   Pulse 62   Temp 98 ?F (36.7 ?C)   Ht 5\' 8"  (1.727 m)   Wt 191 lb 3.2 oz (86.7 kg)   SpO2 100%   BMI 29.07 kg/m?  ? ?Opioid Risk Score:   ?Fall Risk Score:   `1 ? ?Depression screen PHQ 2/9 ? ?Depression screen [facility] 2/9 05/28/2021 04/03/2021 02/05/2021 12/04/2020 10/09/2020 10/01/2020 08/09/2020  ?Decreased Interest 0 0 0 0 0 0 0  ?Down, Depressed, Hopeless 0 0 0 0 0 0 0  ?PHQ - 2 Score 0 0 0 0 0 0 0  ?Altered sleeping - - - - - - -  ?Tired, decreased energy - - - - - - -  ?Change in appetite - - - - - - -  ?Feeling bad or failure about yourself  - - - - - - -  ?Trouble concentrating - - - - - - -  ?Moving slowly or fidgety/restless - - - - - - -  ?Suicidal thoughts - - - - - - -  ?PHQ-9 Score - - - - - - -  ?  ? ?Review of Systems  ?Constitutional: Negative.   ?HENT: Negative.    ?Eyes: Negative.   ?Respiratory: Negative.    ?Cardiovascular: Negative.   ?Gastrointestinal: Negative.   ?Endocrine: Negative.   ?Genitourinary: Negative.   ?Musculoskeletal:  Positive for neck pain.  ?Skin: Negative.   ?Allergic/Immunologic: Negative.   ?Neurological: Negative.   ?Hematological: Negative.   ?Psychiatric/Behavioral: Negative.    ? ?   ?Objective:  ? Physical Exam ?Vitals and nursing note reviewed.  ?Constitutional:   ?   Appearance: Normal appearance.  ?Cardiovascular:  ?   Rate and Rhythm: Normal rate and regular rhythm.  ?   Pulses: Normal pulses.  ?   Heart sounds: Normal heart sounds.  ?Pulmonary:  ?   Effort: Pulmonary effort is normal.  ?Musculoskeletal:  ?   Cervical back: Normal range of motion and neck supple.  ?   Comments: Normal Muscle Bulk and Muscle Testing Reveals: ? Upper Extremities: Full ROM and Muscle Strength 5/5 ?Lumbar Paraspinal Tenderness: L-4-l-5 ?Left Greater Trochanter Tenderness ?Lower Extremities: Full ROM and Muscle Strength 5/5 ?Arises from Table with ease ?Narrow Based Gait  ?   ?Skin: ?   General: Skin is warm and dry.  ?Neurological:  ?   Mental Status: He is alert and oriented to person, place, and time.  ?Psychiatric:     ?   Mood and Affect: Mood normal.     ?   Behavior: Behavior normal.  ? ? ? ? ?   ?  Assessment & Plan:  ?1. Chronic left inguinal  pain: S/P inguinal hernia (indirect) and mesh repair. Continue to Monitor. 03/08//2023. ?Refilled: Hydrocodone 10/325 mg one tablet daily as needed, may take and extra tablet when pain is moderate to sever 15 days out of the month. # 55. Second script sent for the following month.  ?We will continue the opioid monitoring program, this consists of regular clinic visits, examinations, urine drug screen, pill counts as well as use of [place] Controlled Substance Reporting system. A 12 month History has been reviewed on the [place] Controlled Substance Reporting System on 05/28/2021.  ?2. Lumbosacral Spondylosis: Continue with Exercise regime. Continue to Monitor. 05/28/2021 ?3. Muscle Spasm: Continue current medication regimen with Flexeril. Continue to monitor.05/28/2021 ? 4.Chronic Right Shoulder Pain : Chronic Pain Syndrome:  Continue HEP as tolerated.  Continue to Monitor. 05/28/2021.  ?5. Cervicalgia/ Cervical Radiculitis: .No complaints today.Continue HEP as Tolerated. Alternate heat and Ice Therapy as tolerated. Continue current medication regimen. Continue to Monitor. 05/28/2021 ?6. Left Greater Trochanteric Tenderness: Alternate with Ice and Heat Therapy. Continue to Monitor. 05/28/2021 ?7. Right Shoulder Pain: No Complaints Today. Continue with HEP as tolerated and alternate with Ice  and Heat  Therapy. 05/28/2021 ?  ?F/U in 2 months ?  ? ? ? ? ? ? ? ? ?

## 2021-07-14 ENCOUNTER — Ambulatory Visit (INDEPENDENT_AMBULATORY_CARE_PROVIDER_SITE_OTHER): Payer: Medicare Other | Admitting: Nurse Practitioner

## 2021-07-14 ENCOUNTER — Encounter: Payer: Self-pay | Admitting: Nurse Practitioner

## 2021-07-14 VITALS — BP 138/80 | HR 78 | Temp 98.5°F | Ht 67.75 in | Wt 183.2 lb

## 2021-07-14 DIAGNOSIS — Z125 Encounter for screening for malignant neoplasm of prostate: Secondary | ICD-10-CM | POA: Diagnosis not present

## 2021-07-14 DIAGNOSIS — I10 Essential (primary) hypertension: Secondary | ICD-10-CM | POA: Diagnosis not present

## 2021-07-14 DIAGNOSIS — E782 Mixed hyperlipidemia: Secondary | ICD-10-CM | POA: Diagnosis not present

## 2021-07-14 DIAGNOSIS — G4733 Obstructive sleep apnea (adult) (pediatric): Secondary | ICD-10-CM | POA: Diagnosis not present

## 2021-07-14 DIAGNOSIS — Z0001 Encounter for general adult medical examination with abnormal findings: Secondary | ICD-10-CM | POA: Diagnosis not present

## 2021-07-14 DIAGNOSIS — N528 Other male erectile dysfunction: Secondary | ICD-10-CM

## 2021-07-14 MED ORDER — TADALAFIL 10 MG PO TABS: 10.0000 mg | ORAL_TABLET | Freq: Every day | ORAL | 0 refills | Status: DC | PRN

## 2021-07-14 NOTE — Assessment & Plan Note (Signed)
No improvement with viagra. ?No hematuria, nocturia 1x/night, no retention. ? ?Check PSA ?Changed to cialis ?Consider referral to urology if no improvement ?

## 2021-07-14 NOTE — Assessment & Plan Note (Signed)
No CPAP machine. reports he was unable to tolerate the full face mask. He did not request another mask. ?Last sleep study 2019 ?Advised him about possible complications of uncontrolled OSA. Advised to f/up with neurology about resuming CPAP machine. He agreed. ?Entered new referral to [facility] ?

## 2021-07-14 NOTE — Assessment & Plan Note (Signed)
Improved BP with maxzide ?BP Readings from Last 3 Encounters:  ?07/14/21 138/80  ?05/28/21 (!) 158/94  ?04/03/21 (!) 158/92  ? ?Check CMP ?Maintain med dose ?

## 2021-07-14 NOTE — Progress Notes (Signed)
? ?Complete physical exam ? ?Patient: Shaun Lang   DOB: Jul 23, 1955   66 y.o. Male  MRN: [id number] ?Visit Date: 07/14/2021 ? ?Subjective:  ?  ?Chief Complaint  ?Patient presents with  ? Annual Exam  ?  CPE  ? ? ?Shaun Lang is a 66 y.o. male who presents today for a complete physical exam. He reports consuming a general diet.  No exercise  He generally feels well. He reports sleeping well. He does have additional problems to discuss today.  ?Vision:Within the last year ?Dental:Yes ?STD Screen:No ?PSA:No ?BP Readings from Last 3 Encounters:  ?07/14/21 138/80  ?05/28/21 (!) 158/94  ?04/03/21 (!) 158/92  ?  ?Wt Readings from Last 3 Encounters:  ?07/14/21 183 lb 3.2 oz (83.1 kg)  ?05/28/21 191 lb 3.2 oz (86.7 kg)  ?04/03/21 191 lb (86.6 kg)  ?  ?Most recent fall risk assessment: ? ?  05/28/2021  ?  9:47 AM  ?Fall Risk   ?Falls in the past year? 0  ?Number falls in past yr: 0  ? ?Most recent depression screenings: ? ?  07/14/2021  ?  1:41 PM 05/28/2021  ?  9:47 AM  ?PHQ 2/9 Scores  ?PHQ - 2 Score 0 0  ?PHQ- 9 Score 5   ? ?HPI  ?Hypertension ?Improved BP with maxzide ?BP Readings from Last 3 Encounters:  ?07/14/21 138/80  ?05/28/21 (!) 158/94  ?04/03/21 (!) 158/92  ? ?Check CMP ?Maintain med dose ? ?OSA (obstructive sleep apnea) ?No CPAP machine. reports he was unable to tolerate the full face mask. He did not request another mask. ?Last sleep study 2019 ?Advised him about possible complications of uncontrolled OSA. Advised to f/up with neurology about resuming CPAP machine. He agreed. ?Entered new referral to [facility] ? ?Other male erectile dysfunction ?No improvement with viagra. ?No hematuria, nocturia 1x/night, no retention. ? ?Check PSA ?Changed to cialis ?Consider referral to urology if no improvement ? ? ?Past Medical History:  ?Diagnosis Date  ? Bell's palsy April 2010  ? Chest pain with low risk for cardiac etiology   ? Low Risk Myoview Sept 2013  ? DJD (degenerative joint disease)   ? disabilty secondary  to neck issues  ? Hyperlipidemia   ? Hypertension   ? Sleep apnea   ? does not use CPAP  ? Smoker   ? ?Past Surgical History:  ?Procedure Laterality Date  ? BUNIONECTOMY Left 07/15/2017  ? with big toe fracture repair  ? COLONOSCOPY  07/18/2012  ? polyps-Dr.Outlaw  ? HERNIA REPAIR  04/1998  ? [facility]  ? NECK SURGERY  07/21/02 and 01/2006  ? WRIST SURGERY  1992  ? right  ? ?Social History  ? ?Socioeconomic History  ? Marital status: Single  ?  Spouse name: Not on file  ? Number of children: Not on file  ? Years of education: Not on file  ? Highest education level: Not on file  ?Occupational History  ?  Comment: unemployed  ?Tobacco Use  ? Smoking status: Every Day  ?  Packs/day: 1.00  ?  Years: 33.00  ?  Pack years: 33.00  ?  Types: Cigarettes  ? Smokeless tobacco: Never  ?Vaping Use  ? Vaping Use: Never used  ?Substance and Sexual Activity  ? Alcohol use: Yes  ?  Alcohol/week: 3.0 standard drinks  ?  Types: 3 Shots of liquor per week  ? Drug use: No  ? Sexual activity: Yes  ?  Birth control/protection: None  ?Other Topics  Concern  ? Not on file  ?Social History Narrative  ? Not on file  ? ?Social Determinants of Health  ? ?Financial Resource Strain: Low Risk   ? Difficulty of Paying Living Expenses: Not hard at all  ?Food Insecurity: No Food Insecurity  ? Worried About Programme researcher, broadcasting/film/videounning Out of Food in the Last Year: Never true  ? Ran Out of Food in the Last Year: Never true  ?Transportation Needs: No Transportation Needs  ? Lack of Transportation (Medical): No  ? Lack of Transportation (Non-Medical): No  ?Physical Activity: Not on file  ?Stress: No Stress Concern Present  ? Feeling of Stress : Not at all  ?Social Connections: Moderately Isolated  ? Frequency of Communication with Friends and Family: More than three times a week  ? Frequency of Social Gatherings with Friends and Family: More than three times a week  ? Attends Religious Services: 1 to 4 times per year  ? Active Member of Clubs or Organizations: No  ? Attends Tax inspectorClub or  Organization Meetings: Never  ? Marital Status: Divorced  ?Intimate Partner Violence: Not on file  ? ?Family Status  ?Relation Name Status  ? Mother  Deceased  ? Father  Deceased  ? Sister  Alive  ? Neg Hx  (Not Specified)  ? ?Family History  ?Problem Relation Age of Onset  ? Diabetes Mother   ? Cancer Sister   ?     breast  ? Diabetes Sister   ? Stroke Sister [id number]  ? Breast cancer Sister   ? Colon cancer Neg Hx   ? Esophageal cancer Neg Hx   ? Rectal cancer Neg Hx   ? Stomach cancer Neg Hx   ? ?No Known Allergies  ?Patient Care Team: ?Ayan Heffington, Bonna Gainsharlotte Lum, NP as PCP - General (Internal Medicine)  ? ?Medications: ?Outpatient Medications Prior to Visit  ?Medication Sig  ? atorvastatin (LIPITOR) 40 MG tablet Take 1 tablet (40 mg total) by mouth daily at 6 PM.  ? cyclobenzaprine (FLEXERIL) 10 MG tablet TAKE 1 TABLET BY MOUTH TWICE DAILY AS NEEDED FOR MUSCLE SPASM  ? diclofenac (VOLTAREN) 75 MG EC tablet Take 1 tablet (75 mg total) by mouth 2 (two) times daily with a meal.  ? gabapentin (NEURONTIN) 300 MG capsule Take 1 capsule (300 mg total) by mouth 2 (two) times daily.  ? HYDROcodone-acetaminophen (NORCO) 10-325 MG tablet Take 1 tablet by mouth 2 (two) times daily as needed. Do Not Fill Before 06/25/2021  ? lisinopril (ZESTRIL) 5 MG tablet Take 1 tablet (5 mg total) by mouth daily.  ? Multiple Vitamin (MULTIVITAMIN WITH MINERALS) TABS tablet Take 1 tablet by mouth daily.  ? triamterene-hydrochlorothiazide (MAXZIDE-25) 37.5-25 MG tablet Take 1 tablet by mouth daily.  ? [DISCONTINUED] sildenafil (VIAGRA) 50 MG tablet Take 1 tablet (50 mg total) by mouth daily as needed for erectile dysfunction. (Patient not taking: Reported on 07/14/2021)  ? ?No facility-administered medications prior to visit.  ? ? ?Review of Systems  ?Constitutional:  Negative for fever.  ?HENT:  Negative for congestion and sore throat.   ?Eyes:   ?     Negative for visual changes  ?Respiratory:  Positive for apnea and choking. Negative for cough, chest  tightness, shortness of breath, wheezing and stridor.   ?Cardiovascular:  Negative for chest pain, palpitations and leg swelling.  ?Gastrointestinal:  Negative for blood in stool, constipation and diarrhea.  ?Genitourinary:  Negative for dysuria, frequency and urgency.  ?Musculoskeletal:  Negative for myalgias.  ?Skin:  Negative for rash.  ?Neurological:  Negative for dizziness and headaches.  ?Hematological:  Does not bruise/bleed easily.  ?Psychiatric/Behavioral:  Negative for suicidal ideas. The patient is not nervous/anxious.   ? ? ?   ?Objective:  ?BP 138/80 (BP Location: Left Arm, Patient Position: Sitting, Cuff Size: Large)   Pulse 78   Temp 98.5 ?F (36.9 ?C) (Temporal)   Ht 5' 7.75" (1.721 m)   Wt 183 lb 3.2 oz (83.1 kg)   SpO2 99%   BMI 28.06 kg/m?  ?  ? ? ? ?Physical Exam ?Vitals reviewed.  ?Constitutional:   ?   General: He is not in acute distress. ?HENT:  ?   Right Ear: Tympanic membrane, ear canal and external ear normal.  ?   Left Ear: Tympanic membrane, ear canal and external ear normal.  ?   Mouth/Throat:  ?   Pharynx: No oropharyngeal exudate.  ?Eyes:  ?   General: No visual field deficit or scleral icterus. ?   Extraocular Movements: Extraocular movements intact.  ?   Conjunctiva/sclera: Conjunctivae normal.  ?   Pupils: Pupils are equal, round, and reactive to light.  ?Neck:  ?   Thyroid: No thyromegaly.  ?Cardiovascular:  ?   Rate and Rhythm: Normal rate and regular rhythm.  ?   Pulses: Normal pulses.  ?   Heart sounds: Normal heart sounds.  ?Pulmonary:  ?   Effort: Pulmonary effort is normal.  ?   Breath sounds: Normal breath sounds.  ?Chest:  ?   Chest wall: No tenderness.  ?Abdominal:  ?   General: Bowel sounds are normal. There is no distension.  ?   Palpations: Abdomen is soft.  ?   Tenderness: There is no abdominal tenderness.  ?Musculoskeletal:     ?   General: No tenderness. Normal range of motion.  ?   Cervical back: Normal range of motion and neck supple.  ?   Right lower leg: No  edema.  ?   Left lower leg: No edema.  ?Lymphadenopathy:  ?   Cervical: No cervical adenopathy.  ?Skin: ?   General: Skin is warm and dry.  ?Neurological:  ?   Mental Status: He is alert and oriented to pe

## 2021-07-14 NOTE — Patient Instructions (Addendum)
Schedule fasting lab appt. Need to be fasting 8hrs prior to blood draw. ?You will be contacted to schedule appt with [facility] for CPAP machine. ? ? ?Preventive Care 66 Years and Older, Male ?Preventive care refers to lifestyle choices and visits with your health care provider that can promote health and wellness. Preventive care visits are also called wellness exams. ?What can I expect for my preventive care visit? ?Counseling ?During your preventive care visit, your health care provider may ask about your: ?Medical history, including: ?Past medical problems. ?Family medical history. ?History of falls. ?Current health, including: ?Emotional well-being. ?Home life and relationship well-being. ?Sexual activity. ?Memory and ability to understand (cognition). ?Lifestyle, including: ?Alcohol, nicotine or tobacco, and drug use. ?Access to firearms. ?Diet, exercise, and sleep habits. ?Work and work Astronomer. ?Sunscreen use. ?Safety issues such as seatbelt and bike helmet use. ?Physical exam ?Your health care provider will check your: ?Height and weight. These may be used to calculate your BMI (body mass index). BMI is a measurement that tells if you are at a healthy weight. ?Waist circumference. This measures the distance around your waistline. This measurement also tells if you are at a healthy weight and may help predict your risk of certain diseases, such as type 2 diabetes and high blood pressure. ?Heart rate and blood pressure. ?Body temperature. ?Skin for abnormal spots. ?What immunizations do I need? ? ?Vaccines are usually given at various ages, according to a schedule. Your health care provider will recommend vaccines for you based on your age, medical history, and lifestyle or other factors, such as travel or where you work. ?What tests do I need? ?Screening ?Your health care provider may recommend screening tests for certain conditions. This may include: ?Lipid and cholesterol levels. ?Diabetes  screening. This is done by checking your blood sugar (glucose) after you have not eaten for a while (fasting). ?Hepatitis C test. ?Hepatitis B test. ?HIV (human immunodeficiency virus) test. ?STI (sexually transmitted infection) testing, if you are at risk. ?Lung cancer screening. ?Colorectal cancer screening. ?Prostate cancer screening. ?Abdominal aortic aneurysm (AAA) screening. You may need this if you are a current or former smoker. ?Talk with your health care provider about your test results, treatment options, and if necessary, the need for more tests. ?Follow these instructions at home: ?Eating and drinking ? ?Eat a diet that includes fresh fruits and vegetables, whole grains, lean protein, and low-fat dairy products. Limit your intake of foods with high amounts of sugar, saturated fats, and salt. ?Take vitamin and mineral supplements as recommended by your health care provider. ?Do not drink alcohol if your health care provider tells you not to drink. ?If you drink alcohol: ?Limit how much you have to 0-2 drinks a day. ?Know how much alcohol is in your drink. In the [place] one drink equals one 12 oz bottle of beer (355 mL), one 5 oz glass of wine (148 mL), or one 1? oz glass of hard liquor (44 mL). ?Lifestyle ?Brush your teeth every morning and night with fluoride toothpaste. Floss one time each day. ?Exercise for at least 30 minutes 5 or more days each week. ?Do not use any products that contain nicotine or tobacco. These products include cigarettes, chewing tobacco, and vaping devices, such as e-cigarettes. If you need help quitting, ask your health care provider. ?Do not use drugs. ?If you are sexually active, practice safe sex. Use a condom or other form of protection to prevent STIs. ?Take aspirin only as told by your health care  provider. Make sure that you understand how much to take and what form to take. Work with your health care provider to find out whether it is safe and beneficial for you to take  aspirin daily. ?Ask your health care provider if you need to take a cholesterol-lowering medicine (statin). ?Find healthy ways to manage stress, such as: ?Meditation, yoga, or listening to music. ?Journaling. ?Talking to a trusted person. ?Spending time with friends and family. ?Safety ?Always wear your seat belt while driving or riding in a vehicle. ?Do not drive: ?If you have been drinking alcohol. Do not ride with someone who has been drinking. ?When you are tired or distracted. ?While texting. ?If you have been using any mind-altering substances or drugs. ?Wear a helmet and other protective equipment during sports activities. ?If you have firearms in your house, make sure you follow all gun safety procedures. ?Minimize exposure to UV radiation to reduce your risk of skin cancer. ?What's next? ?Visit your health care provider once a year for an annual wellness visit. ?Ask your health care provider how often you should have your eyes and teeth checked. ?Stay up to date on all vaccines. ?This information is not intended to replace advice given to you by your health care provider. Make sure you discuss any questions you have with your health care provider. ?Document Revised: 09/04/2020 Document Reviewed: 09/04/2020 ?Elsevier Patient Education ? 2023 Elsevier Inc. ? ?

## 2021-07-17 ENCOUNTER — Other Ambulatory Visit (INDEPENDENT_AMBULATORY_CARE_PROVIDER_SITE_OTHER): Payer: Medicare Other

## 2021-07-17 DIAGNOSIS — Z125 Encounter for screening for malignant neoplasm of prostate: Secondary | ICD-10-CM | POA: Diagnosis not present

## 2021-07-17 DIAGNOSIS — I10 Essential (primary) hypertension: Secondary | ICD-10-CM

## 2021-07-17 DIAGNOSIS — Z0001 Encounter for general adult medical examination with abnormal findings: Secondary | ICD-10-CM

## 2021-07-17 DIAGNOSIS — E782 Mixed hyperlipidemia: Secondary | ICD-10-CM | POA: Diagnosis not present

## 2021-07-17 DIAGNOSIS — D696 Thrombocytopenia, unspecified: Secondary | ICD-10-CM

## 2021-07-17 LAB — LIPID PANEL
Cholesterol: 213 mg/dL — ABNORMAL HIGH (ref 0–200)
HDL: 61.7 mg/dL (ref >39.00)
LDL Cholesterol: 139 mg/dL — ABNORMAL HIGH (ref 0–99)
NonHDL: 151.58
Total CHOL/HDL Ratio: 3
Triglycerides: 61 mg/dL (ref 0.0–149.0)
VLDL: 12.2 mg/dL (ref 0.0–40.0)

## 2021-07-17 LAB — COMPREHENSIVE METABOLIC PANEL
ALT: 16 U/L (ref 0–53)
AST: 17 U/L (ref 0–37)
Albumin: 4.5 g/dL (ref 3.5–5.2)
Alkaline Phosphatase: 55 U/L (ref 39–117)
BUN: 16 mg/dL (ref 6–23)
CO2: 27 mEq/L (ref 19–32)
Calcium: 9 mg/dL (ref 8.4–10.5)
Chloride: 106 mEq/L (ref 96–112)
Creatinine, Ser: 1.07 mg/dL (ref 0.40–1.50)
GFR: 72.49 mL/min (ref >60.00)
Glucose, Bld: 84 mg/dL (ref 70–99)
Potassium: 4.3 mEq/L (ref 3.5–5.1)
Sodium: 141 mEq/L (ref 135–145)
Total Bilirubin: 0.4 mg/dL (ref 0.2–1.2)
Total Protein: 6.8 g/dL (ref 6.0–8.3)

## 2021-07-17 LAB — CBC
HCT: 39.3 % (ref 39.0–52.0)
Hemoglobin: 12.9 g/dL — ABNORMAL LOW (ref 13.0–17.0)
MCHC: 33 g/dL (ref 30.0–36.0)
MCV: 88 fl (ref 78.0–100.0)
Platelets: 120 10*3/uL — ABNORMAL LOW (ref 150.0–400.0)
RBC: 4.46 Mil/uL (ref 4.22–5.81)
RDW: 14.5 % (ref 11.5–15.5)
WBC: 3 10*3/uL — ABNORMAL LOW (ref 4.0–10.5)

## 2021-07-17 LAB — PSA: PSA: 0.6 ng/mL (ref 0.10–4.00)

## 2021-07-17 NOTE — Addendum Note (Signed)
Addended by: Varney Biles on: 07/17/2021 01:37 PM ? ? Modules accepted: Orders ? ?

## 2021-07-18 LAB — HIV ANTIBODY (ROUTINE TESTING W REFLEX): HIV 1&2 Ab, 4th Generation: NONREACTIVE

## 2021-07-18 MED ORDER — TRIAMTERENE-HCTZ 37.5-25 MG PO TABS: 1.0000 | ORAL_TABLET | Freq: Every day | ORAL | 1 refills | Status: DC

## 2021-07-18 MED ORDER — ROSUVASTATIN CALCIUM 40 MG PO TABS: 40.0000 mg | ORAL_TABLET | Freq: Every day | ORAL | 3 refills | Status: DC

## 2021-07-18 MED ORDER — LISINOPRIL 5 MG PO TABS: 5.0000 mg | ORAL_TABLET | Freq: Every day | ORAL | 3 refills | Status: DC

## 2021-07-18 NOTE — Addendum Note (Signed)
Addended by: Alysia Penna L on: 07/18/2021 02:15 PM ? ? Modules accepted: Orders ? ?

## 2021-07-18 NOTE — Assessment & Plan Note (Signed)
Abnormal lipid panel: elevated TC and LDL. We current hx of HTN and tobacco use, you have a 29.4% risk of cardiovascular disease and stroke. This is high, hence I strongly recommend discontinuation of tobacco use and changing atorvastatin to crestor. New rx sent. ?Repeat lipid panel in 78month ?

## 2021-07-23 ENCOUNTER — Encounter: Payer: Self-pay | Admitting: Registered Nurse

## 2021-07-23 ENCOUNTER — Encounter: Payer: Medicare Other | Attending: Physical Medicine & Rehabilitation | Admitting: Registered Nurse

## 2021-07-23 VITALS — BP 130/85 | HR 67 | Ht 67.0 in | Wt 181.0 lb

## 2021-07-23 DIAGNOSIS — Z79891 Long term (current) use of opiate analgesic: Secondary | ICD-10-CM | POA: Diagnosis not present

## 2021-07-23 DIAGNOSIS — M7918 Myalgia, other site: Secondary | ICD-10-CM | POA: Insufficient documentation

## 2021-07-23 DIAGNOSIS — Z5181 Encounter for therapeutic drug level monitoring: Secondary | ICD-10-CM | POA: Insufficient documentation

## 2021-07-23 DIAGNOSIS — M542 Cervicalgia: Secondary | ICD-10-CM | POA: Insufficient documentation

## 2021-07-23 DIAGNOSIS — M47817 Spondylosis without myelopathy or radiculopathy, lumbosacral region: Secondary | ICD-10-CM | POA: Insufficient documentation

## 2021-07-23 DIAGNOSIS — G894 Chronic pain syndrome: Secondary | ICD-10-CM | POA: Diagnosis not present

## 2021-07-23 MED ORDER — HYDROCODONE-ACETAMINOPHEN 10-325 MG PO TABS
1.0000 | ORAL_TABLET | Freq: Two times a day (BID) | ORAL | 0 refills | Status: DC | PRN
Start: 2021-07-23 — End: 2021-08-14

## 2021-07-23 NOTE — Progress Notes (Signed)
? ?Subjective:  ? ? Patient ID: Shaun Lang, male    DOB: Jun 14, 1955, 66 y.o.   MRN: [id number] ? ?[id number] Shaun Lang is a 66 y.o. male who returns for follow up appointment for chronic pain and medication refill. He states his  pain is located in his neck and lower back. He rates his pain 6. His current exercise regime is walking and performing stretching exercises. ? ?Shaun Lang Morphine equivalent is 20.37 MME. UDS ordered today.    ?  ?Pain Inventory ?Average Pain 7 ?Pain Right Now 6 ?My pain is constant, burning, tingling, and aching ? ?In the last 24 hours, has pain interfered with the following? ?General activity 5 ?Relation with others 7 ?Enjoyment of life 6 ?What TIME of day is your pain at its worst? daytime and night ?Sleep (in general) Poor ? ?Pain is worse with: walking, bending, sitting, inactivity, and standing ?Pain improves with: medication ?Relief from Meds: 7 ? ?Family History  ?Problem Relation Age of Onset  ? Diabetes Mother   ? Cancer Sister   ?     breast  ? Diabetes Sister   ? Stroke Sister 87  ? Breast cancer Sister   ? Colon cancer Neg Hx   ? Esophageal cancer Neg Hx   ? Rectal cancer Neg Hx   ? Stomach cancer Neg Hx   ? ?Social History  ? ?Socioeconomic History  ? Marital status: Single  ?  Spouse name: Not on file  ? Number of children: Not on file  ? Years of education: Not on file  ? Highest education level: Not on file  ?Occupational History  ?  Comment: unemployed  ?Tobacco Use  ? Smoking status: Every Day  ?  Packs/day: 1.00  ?  Years: 33.00  ?  Pack years: 33.00  ?  Types: Cigarettes  ? Smokeless tobacco: Never  ?Vaping Use  ? Vaping Use: Never used  ?Substance and Sexual Activity  ? Alcohol use: Yes  ?  Alcohol/week: 3.0 standard drinks  ?  Types: 3 Shots of liquor per week  ? Drug use: No  ? Sexual activity: Yes  ?  Birth control/protection: None  ?Other Topics Concern  ? Not on file  ?Social History Narrative  ? Not on file  ? ?Social Determinants of Health  ? ?Financial  Resource Strain: Low Risk   ? Difficulty of Paying Living Expenses: Not hard at all  ?Food Insecurity: No Food Insecurity  ? Worried About Charity fundraiser in the Last Year: Never true  ? Ran Out of Food in the Last Year: Never true  ?Transportation Needs: No Transportation Needs  ? Lack of Transportation (Medical): No  ? Lack of Transportation (Non-Medical): No  ?Physical Activity: Not on file  ?Stress: No Stress Concern Present  ? Feeling of Stress : Not at all  ?Social Connections: Moderately Isolated  ? Frequency of Communication with Friends and Family: More than three times a week  ? Frequency of Social Gatherings with Friends and Family: More than three times a week  ? Attends Religious Services: 1 to 4 times per year  ? Active Member of Clubs or Organizations: No  ? Attends Archivist Meetings: Never  ? Marital Status: Divorced  ? ?Past Surgical History:  ?Procedure Laterality Date  ? BUNIONECTOMY Left 07/15/2017  ? with big toe fracture repair  ? COLONOSCOPY  07/18/2012  ? polyps-Dr.Outlaw  ? HERNIA REPAIR  04/1998  ? [facility]  ? NECK  SURGERY  07/21/02 and 01/2006  ? WRIST SURGERY  1992  ? right  ? ?Past Surgical History:  ?Procedure Laterality Date  ? BUNIONECTOMY Left 07/15/2017  ? with big toe fracture repair  ? COLONOSCOPY  07/18/2012  ? polyps-Dr.Outlaw  ? HERNIA REPAIR  04/1998  ? [facility]  ? NECK SURGERY  07/21/02 and 01/2006  ? WRIST SURGERY  1992  ? right  ? ?Past Medical History:  ?Diagnosis Date  ? Bell's palsy April 2010  ? Chest pain with low risk for cardiac etiology   ? Low Risk Myoview Sept 2013  ? DJD (degenerative joint disease)   ? disabilty secondary to neck issues  ? Hyperlipidemia   ? Hypertension   ? Sleep apnea   ? does not use CPAP  ? Smoker   ? ?BP 130/85   Pulse 67   Ht 5\' 7"  (1.702 m)   Wt 181 lb (82.1 kg)   SpO2 97%   BMI 28.35 kg/m?  ? ?Opioid Risk Score:   ?Fall Risk Score:  `1 ? ?Depression screen PHQ 2/9 ? ? ?  07/14/2021  ?  1:41 PM 05/28/2021  ?  9:47 AM 04/03/2021  ?   9:39 AM 02/05/2021  ?  9:37 AM 12/04/2020  ?  9:20 AM 10/09/2020  ?  8:37 AM 10/01/2020  ?  3:05 PM  ?Depression screen PHQ 2/9  ?Decreased Interest 0 0 0 0 0 0 0  ?Down, Depressed, Hopeless 0 0 0 0 0 0 0  ?PHQ - 2 Score 0 0 0 0 0 0 0  ?Altered sleeping 1        ?Tired, decreased energy 3        ?Change in appetite 0        ?Feeling bad or failure about yourself  0        ?Trouble concentrating 0        ?Moving slowly or fidgety/restless 1        ?Suicidal thoughts 0        ?PHQ-9 Score 5        ?Difficult doing work/chores Not difficult at all        ? ? ?Review of Systems  ?Musculoskeletal:  Positive for back pain and neck pain.  ?All other systems reviewed and are negative. ? ?   ?Objective:  ? Physical Exam ?Vitals and nursing note reviewed.  ?Constitutional:   ?   Appearance: Normal appearance.  ?Cardiovascular:  ?   Rate and Rhythm: Normal rate and regular rhythm.  ?   Pulses: Normal pulses.  ?   Heart sounds: Normal heart sounds.  ?Pulmonary:  ?   Effort: Pulmonary effort is normal.  ?   Breath sounds: Normal breath sounds.  ?Musculoskeletal:  ?   Cervical back: Normal range of motion and neck supple.  ?   Comments: Normal Muscle Bulk and Muscle Testing Reveals:  ?Upper Extremities: Full ROM and Muscle Strength 5/5 ?Lower Extremities: Full ROM and Muscle Strength 5/5 ?Arises from Chair with ease ?Narrow Based  Gait  ?   ?Skin: ?   General: Skin is warm and dry.  ?Neurological:  ?   Mental Status: He is alert and oriented to person, place, and time.  ?Psychiatric:     ?   Mood and Affect: Mood normal.     ?   Behavior: Behavior normal.  ? ? ? ? ?   ?Assessment & Plan:  ?1. Chronic left inguinal pain: S/P inguinal  hernia (indirect) and mesh repair. Continue to Monitor. 05/03//2023. ?Refilled: Hydrocodone 10/325 mg one tablet daily as needed, may take and extra tablet when pain is moderate to sever 15 days out of the month. # 41., prescription printed. E-scribed down.   ?We will continue the opioid monitoring  program, this consists of regular clinic visits, examinations, urine drug screen, pill counts as well as use of [place] Controlled Substance Reporting system. A 12 month History has been reviewed on the [place] Controlled Substance Reporting System on 07/23/2021.  ?2. Lumbosacral Spondylosis: Continue with Exercise regime. Continue to Monitor. 07/23/2021 ?3. Muscle Spasm: Continue current medication regimen with Flexeril. Continue to monitor.07/23/2021 ? 4.Chronic Right Shoulder Pain : Chronic Pain Syndrome:  Continue HEP as tolerated.  Continue to Monitor. 07/23/2021.  ?5. Cervicalgia/ Cervical Radiculitis: .No complaints today.Continue HEP as Tolerated. Alternate heat and Ice Therapy as tolerated. Continue current medication regimen. Continue to Monitor. 07/23/2021 ?6. Left Greater Trochanteric Tenderness: Alternate with Ice and Heat Therapy. Continue to Monitor. 07/23/2021 ?7. Right Shoulder Pain: No Complaints Today. Continue with HEP as tolerated and alternate with Ice  and Heat  Therapy. 07/23/2021 ?  ?F/U in 2 months ?  ? ? ? ? ? ? ? ? ? ?

## 2021-07-30 LAB — TOXASSURE SELECT,+ANTIDEPR,UR

## 2021-07-31 ENCOUNTER — Telehealth: Payer: Self-pay | Admitting: *Deleted

## 2021-07-31 NOTE — Telephone Encounter (Signed)
Urine drug screen for this encounter is consistent for prescribed medication 

## 2021-08-14 ENCOUNTER — Telehealth: Payer: Self-pay

## 2021-08-14 MED ORDER — HYDROCODONE-ACETAMINOPHEN 10-325 MG PO TABS: 1.0000 | ORAL_TABLET | Freq: Two times a day (BID) | ORAL | 0 refills | Status: DC | PRN

## 2021-08-14 NOTE — Telephone Encounter (Signed)
Patient is calling for refill on Hydrocodone. He was instructed to call 7 days before due date. Per PMP, last fill was 07/23/21

## 2021-08-14 NOTE — Telephone Encounter (Signed)
PMP was Reviewed.  Hydrocodone e-scribed today,  My-Chart Message sent to Mr. Fillmore regarding the above.

## 2021-08-15 ENCOUNTER — Other Ambulatory Visit (INDEPENDENT_AMBULATORY_CARE_PROVIDER_SITE_OTHER): Payer: Medicare Other

## 2021-08-15 DIAGNOSIS — D696 Thrombocytopenia, unspecified: Secondary | ICD-10-CM

## 2021-08-15 DIAGNOSIS — E782 Mixed hyperlipidemia: Secondary | ICD-10-CM

## 2021-08-15 DIAGNOSIS — D72819 Decreased white blood cell count, unspecified: Secondary | ICD-10-CM

## 2021-08-15 LAB — CBC WITH DIFFERENTIAL/PLATELET
Basophils Absolute: 0 10*3/uL (ref 0.0–0.1)
Basophils Relative: 0.3 % (ref 0.0–3.0)
Eosinophils Absolute: 0.1 10*3/uL (ref 0.0–0.7)
Eosinophils Relative: 2 % (ref 0.0–5.0)
HCT: 37.2 % — ABNORMAL LOW (ref 39.0–52.0)
Hemoglobin: 12.3 g/dL — ABNORMAL LOW (ref 13.0–17.0)
Lymphocytes Relative: 43 % (ref 12.0–46.0)
Lymphs Abs: 1.3 10*3/uL (ref 0.7–4.0)
MCHC: 33 g/dL (ref 30.0–36.0)
MCV: 86.4 fl (ref 78.0–100.0)
Monocytes Absolute: 0.3 10*3/uL (ref 0.1–1.0)
Monocytes Relative: 8.7 % (ref 3.0–12.0)
Neutro Abs: 1.3 10*3/uL — ABNORMAL LOW (ref 1.4–7.7)
Neutrophils Relative %: 46 % (ref 43.0–77.0)
Platelets: 112 10*3/uL — ABNORMAL LOW (ref 150.0–400.0)
RBC: 4.31 Mil/uL (ref 4.22–5.81)
RDW: 13.7 % (ref 11.5–15.5)
WBC: 2.9 10*3/uL — ABNORMAL LOW (ref 4.0–10.5)

## 2021-08-15 LAB — LIPID PANEL
Cholesterol: 119 mg/dL (ref 0–200)
HDL: 52.4 mg/dL (ref >39.00)
LDL Cholesterol: 56 mg/dL (ref 0–99)
NonHDL: 67.08
Total CHOL/HDL Ratio: 2
Triglycerides: 56 mg/dL (ref 0.0–149.0)
VLDL: 11.2 mg/dL (ref 0.0–40.0)

## 2021-08-20 ENCOUNTER — Telehealth: Payer: Self-pay | Admitting: Physician Assistant

## 2021-08-20 NOTE — Telephone Encounter (Signed)
Scheduled appt per 5/26 referral. Pt is aware of appt date and time. Pt is aware to arrive 15 mins prior to appt time and to bring and updated insurance card. Pt is aware of appt location.   

## 2021-08-21 DIAGNOSIS — H43393 Other vitreous opacities, bilateral: Secondary | ICD-10-CM | POA: Diagnosis not present

## 2021-08-21 DIAGNOSIS — H2511 Age-related nuclear cataract, right eye: Secondary | ICD-10-CM | POA: Diagnosis not present

## 2021-08-29 LAB — HM DIABETES FOOT EXAM: HM Diabetic Foot Exam: NORMAL

## 2021-08-29 LAB — HEMOGLOBIN A1C: Hemoglobin A1C: 5.8

## 2021-09-02 NOTE — Progress Notes (Deleted)
[facility] [phone]   [phone]  CONSULT NOTE  REFERRING PHYSICIAN: Baldo Ash Nche   REASON FOR CONSULTATION:  Pancytopenia  HPI Shaun Lang is a 66 y.o. male with a past medical history significant for Bell's palsy, degenerative joint disease, hyperlipidemia, chronic pain syndrome followed by the pain clinic/pain management***, hypertension, sleep apnea, and ***is referred to the clinic for pancytopenia.  The patient had an annual physical exam by his PCP on 07/14/2021.  He did not have any concerning complaints at that time.  The patient had lab work performed through his PCP on 08/15/21 which noted pancytopenia with a low white blood cell count at 2.9, low ANC at 1.3, slightly low hemoglobin at 12.3 with normal MCV at 86.4 and normal RDW at 13.7.  His platelet count is also low at 112 K.  Per chart review, it appears that the patient has had at least neutropenia/leukopenia dating back to 5 years ago.  The patient's platelet count has been low dating back to 1 month ago as well as anemia.  Additional lab studies that were performed show negative HIV and normal iron panel.  He is referred to the clinic for further evaluation regarding these lab abnormalities.  Regarding the anemia, how long?.  Iron rick foods? Fatigue, cold intolerance, lightheadedness, and dypnea on exertion. vitamins?    The oldest records I have are from 2017. he had periods of mild anemia, leukopenia, or pancytopenia. The lowest Hbg seen was was 12.3.  He states she had anemia when she was ***  years old and was previously on iron supplements???.  He denies ever needing an iron infusion or blood transfusion before. He is presently *** on any iron supplements. Denies any other history of vitamin deficiencies except for vitamin D.   Regarding abnormal bleeding or bruising, he denies any other abnormal bleeding including gingival bleeding, hemoptysis, hematemesis, melena,  hematochezia, or hematuria.  Denies any blood thinner use. Denies any chills, night sweats, unexplained weight loss, or lymphadenopathy.  Denies any NSAID use.  He reports craving ice chips.  Denies any chronic kidney disease.  Denies any history of any bariatric surgery.   To the patient's knowledge, he is not sure how long he has had leukopenia but estimates it has been about *** years. Per chart review, I can see the oldest records available to me are from 2017 and he had leukopenia at that time with a WBC of 3.3. The lowest WBC was in 2023 at 2.9 Denies any other known viral infections including hepatitis or HIV.  Alcohol. He denies exposure to radiation or chemotherapy. N/V/D?C. Denies any fever, chills, night sweats, or unexplained weight loss.Supplements/herbal. He denies lymphadenopathy. Denies any personal history of autoimmune disorders.  He denies any history of any vitamin deficiencies except vitamin D. She He does not take NSAIDs. Denies liver disease, heart disease, or gum disease.   Overall the patient is feeling well today without concerning complaints. He denies any current fatigue or changes in his activity levels as he is completely independent. Regarding the thrombocytopenia, this started recently 1 month ago on the labs drawn with a platelet count of 120. The patient has underwent prior surgeries and denies any history of bleeding complications.  The patient denies any fever, chills, night sweats, or unexplained weight loss. The patient denies any herbal supplement use. Denies new medications. The patient denies use of any immunosuppressive drugs.  The patient denies frequent infections or recent infections.  The  patient denies any history of hepatitis, mono, or HIV.  The patient denies any history of liver disease.  The patient denies any history of autoimmune disorders.  Alcohol   HPI  Past Medical History:  Diagnosis Date   Bell's palsy April 2010   Chest pain with low risk for  cardiac etiology    Low Risk Myoview Sept 2013   DJD (degenerative joint disease)    disabilty secondary to neck issues   Hyperlipidemia    Hypertension    Sleep apnea    does not use CPAP   Smoker     Past Surgical History:  Procedure Laterality Date   BUNIONECTOMY Left 07/15/2017   with big toe fracture repair   COLONOSCOPY  07/18/2012   polyps-Dr.Outlaw   HERNIA REPAIR  04/1998   [facility]   NECK SURGERY  07/21/02 and 01/2006   WRIST SURGERY  1992   right    Family History  Problem Relation Age of Onset   Diabetes Mother    Cancer Sister        breast   Diabetes Sister    Stroke Sister 23   Breast cancer Sister    Colon cancer Neg Hx    Esophageal cancer Neg Hx    Rectal cancer Neg Hx    Stomach cancer Neg Hx     Social History Social History   Tobacco Use   Smoking status: Every Day    Packs/day: 1.00    Years: 33.00    Total pack years: 33.00    Types: Cigarettes   Smokeless tobacco: Never  Vaping Use   Vaping Use: Never used  Substance Use Topics   Alcohol use: Yes    Alcohol/week: 3.0 standard drinks of alcohol    Types: 3 Shots of liquor per week   Drug use: No    No Known Allergies  Current Outpatient Medications  Medication Sig Dispense Refill   cyclobenzaprine (FLEXERIL) 10 MG tablet TAKE 1 TABLET BY MOUTH TWICE DAILY AS NEEDED FOR MUSCLE SPASM 60 tablet 2   diclofenac (VOLTAREN) 75 MG EC tablet Take 1 tablet (75 mg total) by mouth 2 (two) times daily with a meal. 60 tablet 3   gabapentin (NEURONTIN) 300 MG capsule Take 1 capsule (300 mg total) by mouth 2 (two) times daily. 60 capsule 2   HYDROcodone-acetaminophen (NORCO) 10-325 MG tablet Take 1 tablet by mouth 2 (two) times daily as needed for moderate pain. 55 tablet 0   lisinopril (ZESTRIL) 5 MG tablet Take 1 tablet (5 mg total) by mouth daily. 90 tablet 3   Multiple Vitamin (MULTIVITAMIN WITH MINERALS) TABS tablet Take 1 tablet by mouth daily.     rosuvastatin (CRESTOR) 40 MG tablet Take 1  tablet (40 mg total) by mouth daily. 90 tablet 3   tadalafil (CIALIS) 10 MG tablet Take 1 tablet (10 mg total) by mouth daily as needed for erectile dysfunction. 20 tablet 0   triamterene-hydrochlorothiazide (MAXZIDE-25) 37.5-25 MG tablet Take 1 tablet by mouth daily. 90 tablet 1   No current facility-administered medications for this visit.    REVIEW OF SYSTEMS:   Review of Systems  Constitutional: Negative for appetite change, chills, fatigue, fever and unexpected weight change.  HENT:   Negative for mouth sores, nosebleeds, sore throat and trouble swallowing.   Eyes: Negative for eye problems and icterus.  Respiratory: Negative for cough, hemoptysis, shortness of breath and wheezing.   Cardiovascular: Negative for chest pain and leg swelling.  Gastrointestinal: Negative  for abdominal pain, constipation, diarrhea, nausea and vomiting.  Genitourinary: Negative for bladder incontinence, difficulty urinating, dysuria, frequency and hematuria.   Musculoskeletal: Negative for back pain, gait problem, neck pain and neck stiffness.  Skin: Negative for itching and rash.  Neurological: Negative for dizziness, extremity weakness, gait problem, headaches, light-headedness and seizures.  Hematological: Negative for adenopathy. Does not bruise/bleed easily.  Psychiatric/Behavioral: Negative for confusion, depression and sleep disturbance. The patient is not nervous/anxious.     PHYSICAL EXAMINATION:  There were no vitals taken for this visit.  ECOG PERFORMANCE STATUS: {CHL ONC ECOG [id number]  Physical Exam  Constitutional: Oriented to person, place, and time and well-developed, well-nourished, and in no distress. No distress.  HENT:  Head: Normocephalic and atraumatic.  Mouth/Throat: Oropharynx is clear and moist. No oropharyngeal exudate.  Eyes: Conjunctivae are normal. Right eye exhibits no discharge. Left eye exhibits no discharge. No scleral icterus.  Neck: Normal range of motion.  Neck supple.  Cardiovascular: Normal rate, regular rhythm, normal heart sounds and intact distal pulses.   Pulmonary/Chest: Effort normal and breath sounds normal. No respiratory distress. No wheezes. No rales.  Abdominal: Soft. Bowel sounds are normal. Exhibits no distension and no mass. There is no tenderness.  Musculoskeletal: Normal range of motion. Exhibits no edema.  Lymphadenopathy:    No cervical adenopathy.  Neurological: Alert and oriented to person, place, and time. Exhibits normal muscle tone. Gait normal. Coordination normal.  Skin: Skin is warm and dry. No rash noted. Not diaphoretic. No erythema. No pallor.  Psychiatric: Mood, memory and judgment normal.  Vitals reviewed.  LABORATORY DATA: Lab Results  Component Value Date   WBC 2.9 (L) 08/15/2021   HGB 12.3 (L) 08/15/2021   HCT 37.2 (L) 08/15/2021   MCV 86.4 08/15/2021   PLT 112.0 (L) 08/15/2021      Chemistry      Component Value Date/Time   NA 141 07/17/2021 0856   K 4.3 07/17/2021 0856   CL 106 07/17/2021 0856   CO2 27 07/17/2021 0856   BUN 16 07/17/2021 0856   CREATININE 1.07 07/17/2021 0856      Component Value Date/Time   CALCIUM 9.0 07/17/2021 0856   ALKPHOS 55 07/17/2021 0856   AST 17 07/17/2021 0856   ALT 16 07/17/2021 0856   BILITOT 0.4 07/17/2021 0856       RADIOGRAPHIC STUDIES: No results found.  ASSESSMENT: This is a very pleasant 66 year old African-American male referred to the clinic for pancytopenia.  The patient was seen with Dr. Julien Nordmann today.  The patient several lab studies performed including a CBC, CMP, LDH, iron studies, ferritin, vitamin N23, folic acid, hepatitis testing, rheumatoid factor, and ANA with immunofixation.  And SPEP.  The patient's labs from today demonstrate:   Dr. Julien Nordmann discussed if there is no clear etiology of his pancytopenia that we may need to arrange for bone marrow biopsy and aspirate to rule out bone marrow pathology.  We will see the patient back  for follow-up visit in 4 weeks which will be approximately 1 week after the bone marrow biopsy and aspirate.  Medications?  Alcohol?  The patient voices understanding of current disease status and treatment options and is in agreement with the current care plan.  All questions were answered. The patient knows to call the clinic with any problems, questions or concerns. We can certainly see the patient much sooner if necessary.  Thank you so much for allowing me to participate in the care of [employer]. I  will continue to follow up the patient with you and assist in his care.  I spent {CHL ONC TIME VISIT - IDPOE:[id number]} counseling the patient face to face. The total time spent in the appointment was {CHL ONC TIME VISIT - XVQMG:[id number]}.  Disclaimer: This note was dictated with voice recognition software. Similar sounding words can inadvertently be transcribed and may not be corrected upon review.   Maycen Degregory L Eulah Walkup September 02, 2021, 2:31 PM

## 2021-09-03 ENCOUNTER — Other Ambulatory Visit: Payer: Self-pay | Admitting: Physician Assistant

## 2021-09-03 ENCOUNTER — Other Ambulatory Visit: Payer: Self-pay | Admitting: Medical Oncology

## 2021-09-03 DIAGNOSIS — D61818 Other pancytopenia: Secondary | ICD-10-CM

## 2021-09-03 DIAGNOSIS — D696 Thrombocytopenia, unspecified: Secondary | ICD-10-CM

## 2021-09-04 ENCOUNTER — Inpatient Hospital Stay: Payer: Medicare Other | Attending: Physician Assistant | Admitting: Physician Assistant

## 2021-09-04 ENCOUNTER — Inpatient Hospital Stay: Payer: Medicare Other | Admitting: *Deleted

## 2021-09-08 ENCOUNTER — Telehealth: Payer: Self-pay | Admitting: Physician Assistant

## 2021-09-08 NOTE — Telephone Encounter (Signed)
R/s pt's new hem appt. Pt is aware.  

## 2021-09-11 ENCOUNTER — Encounter: Payer: Self-pay | Admitting: Neurology

## 2021-09-11 ENCOUNTER — Ambulatory Visit (INDEPENDENT_AMBULATORY_CARE_PROVIDER_SITE_OTHER): Payer: Medicare Other | Admitting: Neurology

## 2021-09-11 VITALS — BP 141/79 | HR 67 | Ht 68.0 in | Wt 183.0 lb

## 2021-09-11 DIAGNOSIS — G4719 Other hypersomnia: Secondary | ICD-10-CM | POA: Insufficient documentation

## 2021-09-11 DIAGNOSIS — G4701 Insomnia due to medical condition: Secondary | ICD-10-CM | POA: Insufficient documentation

## 2021-09-11 DIAGNOSIS — Z79899 Other long term (current) drug therapy: Secondary | ICD-10-CM | POA: Diagnosis not present

## 2021-09-11 DIAGNOSIS — Z9189 Other specified personal risk factors, not elsewhere classified: Secondary | ICD-10-CM | POA: Diagnosis not present

## 2021-09-11 DIAGNOSIS — F519 Sleep disorder not due to a substance or known physiological condition, unspecified: Secondary | ICD-10-CM | POA: Insufficient documentation

## 2021-09-11 DIAGNOSIS — G4734 Idiopathic sleep related nonobstructive alveolar hypoventilation: Secondary | ICD-10-CM | POA: Insufficient documentation

## 2021-09-11 DIAGNOSIS — G8929 Other chronic pain: Secondary | ICD-10-CM

## 2021-09-11 DIAGNOSIS — G4726 Circadian rhythm sleep disorder, shift work type: Secondary | ICD-10-CM | POA: Diagnosis not present

## 2021-09-11 DIAGNOSIS — R0683 Snoring: Secondary | ICD-10-CM | POA: Diagnosis not present

## 2021-09-11 DIAGNOSIS — I739 Peripheral vascular disease, unspecified: Secondary | ICD-10-CM | POA: Diagnosis not present

## 2021-09-11 HISTORY — DX: Other hypersomnia: G47.19

## 2021-09-11 NOTE — Patient Instructions (Signed)
Hypersomnia Hypersomnia is a condition in which a person feels very tired during the day even though the person gets plenty of sleep at night. A person with this condition may take naps during the day and may find it very difficult to wake up from sleep. Hypersomnia may affect a person's ability to think, concentrate, drive, or remember things. What are the causes? The cause of this condition may not be known. Possible causes include: Taking certain medicines. Using drugs or alcohol. Sleep disorders, such as narcolepsy and sleep apnea. Injury to the head, brain, or spinal cord. Tumors. Certain medical conditions. These include: Depression. Diabetes. Gastroesophageal reflux disease (GERD). An underactive thyroid gland (hypothyroidism). What are the signs or symptoms? The main symptoms of hypersomnia include: Feeling very tired throughout the day, regardless of how much sleep you got the night before. Having trouble waking up. Others may find it difficult to wake you up when you are sleeping. Sleeping for longer and longer periods at a time. Taking naps throughout the day. Other symptoms may include: Feeling restless, anxious, or annoyed. Lacking energy. Having trouble with: Remembering. Speaking. Thinking. Loss of appetite. Seeing, hearing, tasting, smelling, or feeling things that are not real (hallucinations). How is this diagnosed? This condition may be diagnosed based on: Your symptoms and medical history. Your sleeping habits. Your health care provider may ask you to write down your sleeping habits in a daily sleep log, along with any symptoms you have. A series of tests that are done while you sleep (sleep study or polysomnogram). A test that measures how quickly you can fall asleep during the day (daytime nap study or multiple sleep latency test). How is this treated? This condition may be treated by: Following a regular sleep routine. Making lifestyle changes, such as  changing your eating habits, getting regular exercise, and avoiding alcohol or caffeinated beverages. Taking medicines to make you more alert (stimulants) during the day. Treating any underlying medical causes of hypersomnia. Follow these instructions at home: Sleep habits Stick to a routine that includes going to bed and waking up at the same times every day and night. Practice a relaxing bedtime routine. This may include reading, meditation, deep breathing, or taking a warm bath before going to sleep. Exercise regularly as told by your health care provider. However, avoid exercising in the hours right before bedtime. Keep your sleep environment at a cooler temperature, darkened, and quiet. Sleep with pillows and a mattress that are comfortable and supportive. Schedule short 20-minute naps for when you feel sleepiest during the day. Talk with your employer or teachers about your hypersomnia. If possible, adjust your schedule so that: You have a regular daytime work schedule. You can take a scheduled nap during the day. You do not have to work or be active at night. Do not eat a heavy meal for a few hours before bedtime. Eat your meals at about the same times every day. Safety  Do not drive or use machinery if you are sleepy. Ask your health care provider if it is safe for you to drive. Wear a life jacket when swimming or spending time near water. General instructions  Take over-the-counter and prescription medicines only as told by your health care provider. This includes supplements. Avoid drinking alcohol or caffeinated beverages. Keep a sleep log that will help your health care provider manage your condition. This may include information about: What time you go to bed each night. How often you wake up at night. How many hours   you sleep at night. How often and for how long you nap during the day. Any observations from others, such as leg movements during sleep, sleep walking, or  snoring. Keep all follow-up visits. This is important. Contact a health care provider if: You have new symptoms. Your symptoms get worse. Get help right away if: You have thoughts about hurting yourself or someone else. Get help right away if you feel like you may hurt yourself or others, or have thoughts about taking your own life. Go to your nearest emergency room or: Call 911. Call the National Suicide Prevention Lifeline at 419-222-2991 or 988. This is open 24 hours a day. Text the Crisis Text Line at (509)560-9551. Summary Hypersomnia refers to a condition in which you feel very tired during the day even though you get plenty of sleep at night. A person with this condition may take naps during the day and may find it very difficult to wake up from sleep. Hypersomnia may affect a person's ability to think, concentrate, drive, or remember things. Treatment may include a regular sleep routine and making some lifestyle changes. This information is not intended to replace advice given to you by your health care provider. Make sure you discuss any questions you have with your health care provider. Document Revised: 02/17/2021 Document Reviewed: 02/17/2021 Elsevier Patient Education  2023 Elsevier Inc. Quality Sleep Information, Adult Quality sleep is important for your mental and physical health. It also improves your quality of life. Quality sleep means you: Are asleep for most of the time you are in bed. Fall asleep within 30 minutes. Wake up no more than once a night.  Are awake for no longer than 20 minutes if you do wake up during the night. Most adults need 7-8 hours of quality sleep each night. How can poor sleep affect me? If you do not get enough quality sleep, you may have: Mood swings. Daytime sleepiness. Confusion. Decreased reaction time. Sleep disorders, such as insomnia and sleep apnea. Difficulty with: Solving problems. Coping with stress. Paying attention. These  issues may affect your performance and productivity at work, school, and at home. Lack of sleep may also put you at higher risk for accidents, suicide, and risky behaviors. If you do not get quality sleep you may also be at higher risk for several health problems, including: Infections. Type 2 diabetes. Heart disease. High blood pressure. Obesity. Worsening of long-term conditions, like arthritis, kidney disease, depression, Parkinson's disease, and epilepsy. What actions can I take to get more quality sleep?     Stick to a sleep schedule. Go to sleep and wake up at about the same time each day. Do not try to sleep less on weekdays and make up for lost sleep on weekends. This does not work. Try to get about 30 minutes of exercise on most days. Do not exercise 2-3 hours before going to bed. Limit naps during the day to 30 minutes or less. Do not use any products that contain nicotine or tobacco, such as cigarettes or e-cigarettes. If you need help quitting, ask your health care provider. Do not drink caffeinated beverages for at least 8 hours before going to bed. Coffee, tea, and some sodas contain caffeine. Do not drink alcohol close to bedtime. Do not eat large meals close to bedtime. Do not take naps in the late afternoon. Try to get at least 30 minutes of sunlight every day. Morning sunlight is best. Make time to relax before bed. Reading, listening to music,  or taking a hot bath promotes quality sleep. Make your bedroom a place that promotes quality sleep. Keep your bedroom dark, quiet, and at a comfortable room temperature. Make sure your bed is comfortable. Take out sleep distractions like TV, a computer, smartphone, and bright lights. If you are lying awake in bed for longer than 20 minutes, get up and do a relaxing activity until you feel sleepy. Work with your health care provider to treat medical conditions that may affect sleeping, such as: Nasal obstruction. Snoring. Sleep  apnea and other sleep disorders. Talk to your health care provider if you think any of your prescription medicines may cause you to have difficulty falling or staying asleep. If you have sleep problems, talk with a sleep consultant. If you think you have a sleep disorder, talk with your health care provider about getting evaluated by a specialist. Where to find more information National Sleep Foundation website: https://sleepfoundation.org National Heart, Lung, and Blood Institute (NHLBI): https://hall.info/.pdf Centers for Disease Control and Prevention (CDC): DetailSports.is Contact a health care provider if you: Have trouble getting to sleep or staying asleep. Often wake up very early in the morning and cannot get back to sleep. Have daytime sleepiness. Have daytime sleep attacks of suddenly falling asleep and sudden muscle weakness (narcolepsy). Have a tingling sensation in your legs with a strong urge to move your legs (restless legs syndrome). Stop breathing briefly during sleep (sleep apnea). Think you have a sleep disorder or are taking a medicine that is affecting your quality of sleep. Summary Most adults need 7-8 hours of quality sleep each night. Getting enough quality sleep is an important part of health and well-being. Make your bedroom a place that promotes quality sleep and avoid things that may cause you to have poor sleep, such as alcohol, caffeine, smoking, and large meals. Talk to your health care provider if you have trouble falling asleep or staying asleep. This information is not intended to replace advice given to you by your health care provider. Make sure you discuss any questions you have with your health care provider. Document Revised: 04/14/2021 Document Reviewed: 01/06/2021 Elsevier Patient Education  2023 [employer].
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
## 2021-09-12 ENCOUNTER — Telehealth: Payer: Self-pay | Admitting: *Deleted

## 2021-09-12 MED ORDER — HYDROCODONE-ACETAMINOPHEN 10-325 MG PO TABS
1.0000 | ORAL_TABLET | Freq: Two times a day (BID) | ORAL | 0 refills | Status: DC | PRN
Start: 2021-09-12 — End: 2021-09-22

## 2021-09-15 ENCOUNTER — Telehealth: Payer: Self-pay | Admitting: Neurology

## 2021-09-22 ENCOUNTER — Encounter: Payer: Medicare Other | Attending: Physical Medicine & Rehabilitation | Admitting: Registered Nurse

## 2021-09-22 ENCOUNTER — Encounter: Payer: Self-pay | Admitting: Registered Nurse

## 2021-09-22 VITALS — BP 130/78 | HR 68 | Ht 68.0 in | Wt 180.2 lb

## 2021-09-22 DIAGNOSIS — M7918 Myalgia, other site: Secondary | ICD-10-CM | POA: Insufficient documentation

## 2021-09-22 DIAGNOSIS — Z5181 Encounter for therapeutic drug level monitoring: Secondary | ICD-10-CM | POA: Diagnosis not present

## 2021-09-22 DIAGNOSIS — M25512 Pain in left shoulder: Secondary | ICD-10-CM | POA: Insufficient documentation

## 2021-09-22 DIAGNOSIS — G894 Chronic pain syndrome: Secondary | ICD-10-CM | POA: Insufficient documentation

## 2021-09-22 DIAGNOSIS — M542 Cervicalgia: Secondary | ICD-10-CM | POA: Insufficient documentation

## 2021-09-22 DIAGNOSIS — Z79891 Long term (current) use of opiate analgesic: Secondary | ICD-10-CM | POA: Diagnosis not present

## 2021-09-22 DIAGNOSIS — M47817 Spondylosis without myelopathy or radiculopathy, lumbosacral region: Secondary | ICD-10-CM | POA: Insufficient documentation

## 2021-09-22 DIAGNOSIS — M25511 Pain in right shoulder: Secondary | ICD-10-CM | POA: Diagnosis not present

## 2021-09-22 DIAGNOSIS — G8929 Other chronic pain: Secondary | ICD-10-CM | POA: Diagnosis not present

## 2021-09-22 MED ORDER — HYDROCODONE-ACETAMINOPHEN 10-325 MG PO TABS: 1.0000 | ORAL_TABLET | Freq: Two times a day (BID) | ORAL | 0 refills | Status: DC | PRN

## 2021-09-22 NOTE — Progress Notes (Unsigned)
Subjective:    Patient ID: Shaun Lang, male    DOB: 1955-10-10, 66 y.o.   MRN: [id number]  HPI: Shaun Lang is a 66 y.o. male who returns for follow up appointment for chronic pain and medication refill. He states his pain is located in his  neck, bilateral shoulders and lower back pain. He rates his pain 8. His current exercise regime is walking and performing stretching exercises.  Mr. Dresser Morphine equivalent is  16.81 MME.   Last UDS was Performed on 07/23/2021, it was consistent.     Pain Inventory Average Pain 8 Pain Right Now 8 My pain is dull and aching  In the last 24 hours, has pain interfered with the following? General activity 7 Relation with others 8 Enjoyment of life 7 What TIME of day is your pain at its worst? daytime and night Sleep (in general) Poor  Pain is worse with: bending, sitting, standing, and some activites Pain improves with: medication Relief from Meds: 7  Family History  Problem Relation Age of Onset  . Diabetes Mother   . Cancer Sister        breast  . Diabetes Sister   . Stroke Sister 45  . Breast cancer Sister   . Colon cancer Neg Hx   . Esophageal cancer Neg Hx   . Rectal cancer Neg Hx   . Stomach cancer Neg Hx    Social History   Socioeconomic History  . Marital status: Single    Spouse name: Not on file  . Number of children: Not on file  . Years of education: Not on file  . Highest education level: Not on file  Occupational History    Comment: unemployed  Tobacco Use  . Smoking status: Every Day    Packs/day: 1.00    Years: 33.00    Total pack years: 33.00    Types: Cigarettes  . Smokeless tobacco: Never  Vaping Use  . Vaping Use: Never used  Substance and Sexual Activity  . Alcohol use: Yes    Alcohol/week: 3.0 standard drinks of alcohol    Types: 3 Shots of liquor per week  . Drug use: No  . Sexual activity: Yes    Birth control/protection: None  Other Topics Concern  . Not on file  Social History  Narrative  . Not on file   Social Determinants of Health   Financial Resource Strain: Low Risk  (10/01/2020)   Overall Financial Resource Strain (CARDIA)   . Difficulty of Paying Living Expenses: Not hard at all  Food Insecurity: No Food Insecurity (10/01/2020)   Hunger Vital Sign   . Worried About Programme researcher, broadcasting/film/video in the Last Year: Never true   . Ran Out of Food in the Last Year: Never true  Transportation Needs: No Transportation Needs (10/01/2020)   PRAPARE - Transportation   . Lack of Transportation (Medical): No   . Lack of Transportation (Non-Medical): No  Physical Activity: Insufficiently Active (09/14/2019)   Exercise Vital Sign   . Days of Exercise per Week: 3 days   . Minutes of Exercise per Session: 10 min  Stress: No Stress Concern Present (10/01/2020)   [employer] of Occupational Health - Occupational Stress Questionnaire   . Feeling of Stress : Not at all  Social Connections: Moderately Isolated (10/01/2020)   Social Connection and Isolation Panel [NHANES]   . Frequency of Communication with Friends and Family: More than three times a week   . Frequency  of Social Gatherings with Friends and Family: More than three times a week   . Attends Religious Services: 1 to 4 times per year   . Active Member of Clubs or Organizations: No   . Attends Banker Meetings: Never   . Marital Status: Divorced   Past Surgical History:  Procedure Laterality Date  . BUNIONECTOMY Left 07/15/2017   with big toe fracture repair  . COLONOSCOPY  07/18/2012   polyps-Dr.Outlaw  . HERNIA REPAIR  04/1998   [facility]  . NECK SURGERY  07/21/02 and 01/2006  . WRIST SURGERY  1992   right   Past Surgical History:  Procedure Laterality Date  . BUNIONECTOMY Left 07/15/2017   with big toe fracture repair  . COLONOSCOPY  07/18/2012   polyps-Dr.Outlaw  . HERNIA REPAIR  04/1998   [facility]  . NECK SURGERY  07/21/02 and 01/2006  . WRIST SURGERY  1992   right   Past Medical History:   Diagnosis Date  . Bell's palsy April 2010  . Chest pain with low risk for cardiac etiology    Low Risk Myoview Sept 2013  . DJD (degenerative joint disease)    disabilty secondary to neck issues  . Hyperlipidemia   . Hypertension   . Sleep apnea    does not use CPAP  . Smoker    BP 130/78   Pulse 68   Ht 5\' 8"  (1.727 m)   Wt 180 lb 3.2 oz (81.7 kg)   SpO2 98%   BMI 27.40 kg/m   Opioid Risk Score:   Fall Risk Score:  `1  Depression screen PHQ 2/9     09/22/2021    1:22 PM 07/23/2021   10:03 AM 07/14/2021    1:41 PM 05/28/2021    9:47 AM 04/03/2021    9:39 AM 02/05/2021    9:37 AM 12/04/2020    9:20 AM  Depression screen PHQ 2/9  Decreased Interest 0 0 0 0 0 0 0  Down, Depressed, Hopeless 0 0 0 0 0 0 0  PHQ - 2 Score 0 0 0 0 0 0 0  Altered sleeping   1      Tired, decreased energy   3      Change in appetite   0      Feeling bad or failure about yourself    0      Trouble concentrating   0      Moving slowly or fidgety/restless   1      Suicidal thoughts   0      PHQ-9 Score   5      Difficult doing work/chores   Not difficult at all         Review of Systems  Constitutional: Negative.   HENT: Negative.    Eyes: Negative.   Respiratory: Negative.    Cardiovascular: Negative.   Gastrointestinal: Negative.   Endocrine: Negative.   Genitourinary: Negative.   Musculoskeletal:  Positive for back pain.  Skin: Negative.   Allergic/Immunologic: Negative.   Neurological: Negative.   Hematological: Negative.   Psychiatric/Behavioral: Negative.        Objective:   Physical Exam Vitals and nursing note reviewed.  Constitutional:      Appearance: Normal appearance.  Cardiovascular:     Rate and Rhythm: Normal rate and regular rhythm.     Pulses: Normal pulses.     Heart sounds: Normal heart sounds.  Pulmonary:     Effort: Pulmonary effort is  normal.     Breath sounds: Normal breath sounds.  Musculoskeletal:     Cervical back: Normal range of motion and neck  supple.     Comments: Normal Muscle Bulk and Muscle Testing Reveals:  Upper Extremities: Full ROM and Muscle Strength 5/5 Right Greater Trochanter Tenderness Lower Extremities: Full ROM and Muscle Strength 5/5 Arises from Table with Ease Narrow Based Gait     Skin:    General: Skin is warm and dry.  Neurological:     Mental Status: He is alert and oriented to person, place, and time.  Psychiatric:        Mood and Affect: Mood normal.        Behavior: Behavior normal.         Assessment & Plan:

## 2021-09-28 ENCOUNTER — Ambulatory Visit (INDEPENDENT_AMBULATORY_CARE_PROVIDER_SITE_OTHER): Payer: Medicare Other | Admitting: Neurology

## 2021-09-28 DIAGNOSIS — G4719 Other hypersomnia: Secondary | ICD-10-CM

## 2021-09-28 DIAGNOSIS — R0683 Snoring: Secondary | ICD-10-CM

## 2021-09-28 DIAGNOSIS — Z9189 Other specified personal risk factors, not elsewhere classified: Secondary | ICD-10-CM

## 2021-09-28 DIAGNOSIS — G4734 Idiopathic sleep related nonobstructive alveolar hypoventilation: Secondary | ICD-10-CM

## 2021-09-28 DIAGNOSIS — G4726 Circadian rhythm sleep disorder, shift work type: Secondary | ICD-10-CM

## 2021-09-28 DIAGNOSIS — Z79899 Other long term (current) drug therapy: Secondary | ICD-10-CM

## 2021-09-28 DIAGNOSIS — F519 Sleep disorder not due to a substance or known physiological condition, unspecified: Secondary | ICD-10-CM

## 2021-10-01 ENCOUNTER — Encounter: Payer: Self-pay | Admitting: Physician Assistant

## 2021-10-01 ENCOUNTER — Other Ambulatory Visit: Payer: Self-pay

## 2021-10-01 ENCOUNTER — Inpatient Hospital Stay: Payer: Medicare Other | Attending: Physician Assistant | Admitting: Physician Assistant

## 2021-10-01 ENCOUNTER — Inpatient Hospital Stay: Payer: Medicare Other

## 2021-10-01 VITALS — BP 146/85 | HR 72 | Temp 98.2°F | Resp 20 | Wt 183.7 lb

## 2021-10-01 DIAGNOSIS — F17210 Nicotine dependence, cigarettes, uncomplicated: Secondary | ICD-10-CM | POA: Insufficient documentation

## 2021-10-01 DIAGNOSIS — G510 Bell's palsy: Secondary | ICD-10-CM | POA: Diagnosis not present

## 2021-10-01 DIAGNOSIS — G4733 Obstructive sleep apnea (adult) (pediatric): Secondary | ICD-10-CM | POA: Insufficient documentation

## 2021-10-01 DIAGNOSIS — E785 Hyperlipidemia, unspecified: Secondary | ICD-10-CM | POA: Diagnosis not present

## 2021-10-01 DIAGNOSIS — I10 Essential (primary) hypertension: Secondary | ICD-10-CM | POA: Diagnosis not present

## 2021-10-01 DIAGNOSIS — D61818 Other pancytopenia: Secondary | ICD-10-CM | POA: Insufficient documentation

## 2021-10-01 DIAGNOSIS — M47812 Spondylosis without myelopathy or radiculopathy, cervical region: Secondary | ICD-10-CM | POA: Insufficient documentation

## 2021-10-01 DIAGNOSIS — D72818 Other decreased white blood cell count: Secondary | ICD-10-CM

## 2021-10-01 DIAGNOSIS — Z79899 Other long term (current) drug therapy: Secondary | ICD-10-CM | POA: Insufficient documentation

## 2021-10-01 DIAGNOSIS — D696 Thrombocytopenia, unspecified: Secondary | ICD-10-CM

## 2021-10-01 DIAGNOSIS — R7989 Other specified abnormal findings of blood chemistry: Secondary | ICD-10-CM | POA: Insufficient documentation

## 2021-10-01 DIAGNOSIS — D649 Anemia, unspecified: Secondary | ICD-10-CM

## 2021-10-01 LAB — CMP (CANCER CENTER ONLY)
ALT: 20 U/L (ref 0–44)
AST: 20 U/L (ref 15–41)
Albumin: 4.5 g/dL (ref 3.5–5.0)
Alkaline Phosphatase: 52 U/L (ref 38–126)
Anion gap: 6 (ref 5–15)
BUN: 16 mg/dL (ref 8–23)
CO2: 27 mmol/L (ref 22–32)
Calcium: 9.7 mg/dL (ref 8.9–10.3)
Chloride: 108 mmol/L (ref 98–111)
Creatinine: 1.14 mg/dL (ref 0.61–1.24)
GFR, Estimated: >60 mL/min (ref >60)
Glucose, Bld: 86 mg/dL (ref 70–99)
Potassium: 3.8 mmol/L (ref 3.5–5.1)
Sodium: 141 mmol/L (ref 135–145)
Total Bilirubin: 0.5 mg/dL (ref 0.3–1.2)
Total Protein: 7.7 g/dL (ref 6.5–8.1)

## 2021-10-01 LAB — CBC WITH DIFFERENTIAL (CANCER CENTER ONLY)
Abs Immature Granulocytes: 0 10*3/uL (ref 0.00–0.07)
Basophils Absolute: 0 10*3/uL (ref 0.0–0.1)
Basophils Relative: 1 %
Eosinophils Absolute: 0.1 10*3/uL (ref 0.0–0.5)
Eosinophils Relative: 1 %
HCT: 37.6 % — ABNORMAL LOW (ref 39.0–52.0)
Hemoglobin: 12.4 g/dL — ABNORMAL LOW (ref 13.0–17.0)
Immature Granulocytes: 0 %
Lymphocytes Relative: 41 %
Lymphs Abs: 1.5 10*3/uL (ref 0.7–4.0)
MCH: 28.6 pg (ref 26.0–34.0)
MCHC: 33 g/dL (ref 30.0–36.0)
MCV: 86.6 fL (ref 80.0–100.0)
Monocytes Absolute: 0.2 10*3/uL (ref 0.1–1.0)
Monocytes Relative: 6 %
Neutro Abs: 1.8 10*3/uL (ref 1.7–7.7)
Neutrophils Relative %: 51 %
Platelet Count: 132 10*3/uL — ABNORMAL LOW (ref 150–400)
RBC: 4.34 MIL/uL (ref 4.22–5.81)
RDW: 14.4 % (ref 11.5–15.5)
WBC Count: 3.6 10*3/uL — ABNORMAL LOW (ref 4.0–10.5)
nRBC: 0 % (ref 0.0–0.2)

## 2021-10-01 LAB — HEPATITIS B SURFACE ANTIGEN: Hepatitis B Surface Ag: NONREACTIVE

## 2021-10-01 LAB — IRON AND IRON BINDING CAPACITY (CC-WL,HP ONLY)
Iron: 99 ug/dL (ref 45–182)
Saturation Ratios: 32 % (ref 17.9–39.5)
TIBC: 312 ug/dL (ref 250–450)
UIBC: 213 ug/dL

## 2021-10-01 LAB — HEPATITIS B SURFACE ANTIBODY,QUALITATIVE: Hep B S Ab: NONREACTIVE

## 2021-10-01 LAB — HEPATITIS B CORE ANTIBODY, TOTAL: Hep B Core Total Ab: NONREACTIVE

## 2021-10-01 LAB — VITAMIN B12: Vitamin B-12: 282 pg/mL (ref 180–914)

## 2021-10-01 LAB — HEPATITIS C ANTIBODY: HCV Ab: NONREACTIVE

## 2021-10-01 LAB — FOLATE: Folate: 11.3 ng/mL (ref >5.9)

## 2021-10-01 NOTE — Progress Notes (Signed)
[facility] Telephone:([phone]) [phone]   Fax:([phone] [place] NOTE  Patient Care Team: Nche, Charlene Brooke, NP as PCP - General (Internal Medicine)  CHIEF COMPLAINTS/PURPOSE OF CONSULTATION:  Pancytopenia  HISTORY OF PRESENTING ILLNESS:  BENTLEE BENNINGFIELD 66 y.o. male with medical history significant for Bell's palsy, DJD of the neck, hyperlipidemia, hypertension and OSA not using CPAP. He is unaccompanied for this visit.   On review of the previous records, there is evidence of long standing leukopenia and mild periodic anemia for several years. Most recent labs from 08/15/2021 showed WBC 2.9, Hgb 12.3 and Plt 112K.   On exam today, Mr. Bhalla reports that he is feeling well without any changes to his health. His energy and appetite levels are stable. He denies any recent weight changes. He denies any nausea, vomiting or abdominal pain. He reports occasional episodes of constipation. He denies bruising or signs of active bleeding. He denies fevers, chills, sweats, shortness of breath, chest pain or cough.   MEDICAL HISTORY:  Past Medical History:  Diagnosis Date   Bell's palsy April 2010   Chest pain with low risk for cardiac etiology    Low Risk Myoview Sept 2013   DJD (degenerative joint disease)    disabilty secondary to neck issues   Hyperlipidemia    Hypertension    Sleep apnea    does not use CPAP   Smoker     SURGICAL HISTORY: Past Surgical History:  Procedure Laterality Date   BUNIONECTOMY Left 07/15/2017   with big toe fracture repair   COLONOSCOPY  07/18/2012   polyps-Dr.Outlaw   HERNIA REPAIR  04/1998   [facility]   NECK SURGERY  07/21/02 and 01/2006   WRIST SURGERY  1992   right    SOCIAL HISTORY: Social History   Socioeconomic History   Marital status: Single    Spouse name: Not on file   Number of children: Not on file   Years of education: Not on file   Highest education level: Not on file  Occupational History    Comment:  unemployed  Tobacco Use   Smoking status: Every Day    Packs/day: 0.50    Years: 33.00    Total pack years: 16.50    Types: Cigarettes   Smokeless tobacco: Never  Vaping Use   Vaping Use: Never used  Substance and Sexual Activity   Alcohol use: Not Currently    Alcohol/week: 2.0 standard drinks of alcohol    Types: 2 Cans of beer per week   Drug use: No   Sexual activity: Yes    Birth control/protection: None  Other Topics Concern   Not on file  Social History Narrative   Not on file   Social Determinants of Health   Financial Resource Strain: Low Risk  (10/01/2020)   Overall Financial Resource Strain (CARDIA)    Difficulty of Paying Living Expenses: Not hard at all  Food Insecurity: No Food Insecurity (10/01/2020)   Hunger Vital Sign    Worried About Running Out of Food in the Last Year: Never true    Ran Out of Food in the Last Year: Never true  Transportation Needs: No Transportation Needs (10/01/2020)   PRAPARE - Hydrologist (Medical): No    Lack of Transportation (Non-Medical): No  Physical Activity: Insufficiently Active (09/14/2019)   Exercise Vital Sign    Days of Exercise per Week: 3 days    Minutes of Exercise per Session: 10  min  Stress: No Stress Concern Present (10/01/2020)   [place]    Feeling of Stress : Not at all  Social Connections: Moderately Isolated (10/01/2020)   Social Connection and Isolation Panel [NHANES]    Frequency of Communication with Friends and Family: More than three times a week    Frequency of Social Gatherings with Friends and Family: More than three times a week    Attends Religious Services: 1 to 4 times per year    Active Member of [employer] or Organizations: No    Attends Archivist Meetings: Never    Marital Status: Divorced  Human resources officer Violence: Not At Risk (09/14/2019)   Humiliation, Afraid, Rape, and Kick questionnaire     Fear of Current or Ex-Partner: No    Emotionally Abused: No    Physically Abused: No    Sexually Abused: No    FAMILY HISTORY: Family History  Problem Relation Age of Onset   Diabetes Mother    Cancer Sister        breast   Diabetes Sister    Stroke Sister 1   Breast cancer Sister    Colon cancer Neg Hx    Esophageal cancer Neg Hx    Rectal cancer Neg Hx    Stomach cancer Neg Hx     ALLERGIES:  has No Known Allergies.  MEDICATIONS:  Current Outpatient Medications  Medication Sig Dispense Refill   cyclobenzaprine (FLEXERIL) 10 MG tablet TAKE 1 TABLET BY MOUTH TWICE DAILY AS NEEDED FOR MUSCLE SPASM 60 tablet 2   gabapentin (NEURONTIN) 300 MG capsule Take 1 capsule (300 mg total) by mouth 2 (two) times daily. 60 capsule 2   HYDROcodone-acetaminophen (NORCO) 10-325 MG tablet Take 1 tablet by mouth 2 (two) times daily as needed for moderate pain. Do Not Fill Before 11/12/2021 55 tablet 0   lisinopril (ZESTRIL) 5 MG tablet Take 1 tablet (5 mg total) by mouth daily. 90 tablet 3   Multiple Vitamin (MULTIVITAMIN WITH MINERALS) TABS tablet Take 1 tablet by mouth daily.     rosuvastatin (CRESTOR) 40 MG tablet Take 1 tablet (40 mg total) by mouth daily. 90 tablet 3   tadalafil (CIALIS) 10 MG tablet Take 1 tablet (10 mg total) by mouth daily as needed for erectile dysfunction. 20 tablet 0   triamterene-hydrochlorothiazide (MAXZIDE-25) 37.5-25 MG tablet Take 1 tablet by mouth daily. 90 tablet 1   No current facility-administered medications for this visit.    REVIEW OF SYSTEMS:   Constitutional: ( - ) fevers, ( - )  chills , ( - ) night sweats Eyes: ( - ) blurriness of vision, ( - ) double vision, ( - ) watery eyes Ears, nose, mouth, throat, and face: ( - ) mucositis, ( - ) sore throat Respiratory: ( - ) cough, ( - ) dyspnea, ( - ) wheezes Cardiovascular: ( - ) palpitation, ( - ) chest discomfort, ( - ) lower extremity swelling Gastrointestinal:  ( - ) nausea, ( - ) heartburn, ( -  ) change in bowel habits Skin: ( - ) abnormal skin rashes Lymphatics: ( - ) new lymphadenopathy, ( - ) easy bruising Neurological: ( - ) numbness, ( - ) tingling, ( - ) new weaknesses Behavioral/Psych: ( - ) mood change, ( - ) new changes  All other systems were reviewed with the patient and are negative.  PHYSICAL EXAMINATION: ECOG PERFORMANCE STATUS: 0 - Asymptomatic  Vitals:   10/01/21  1403  BP: (!) 146/85  Pulse: 72  Resp: 20  Temp: 98.2 F (36.8 C)  SpO2: 100%   Filed Weights   10/01/21 1403  Weight: 183 lb 11.2 oz (83.3 kg)    GENERAL: well appearing male in NAD  SKIN: skin color, texture, turgor are normal, no rashes or significant lesions EYES: conjunctiva are pink and non-injected, sclera clear OROPHARYNX: no exudate, no erythema; lips, buccal mucosa, and tongue normal  NECK: supple, non-tender LYMPH:  no palpable lymphadenopathy in the cervical or supraclavicular lymph nodes.  LUNGS: clear to auscultation and percussion with normal breathing effort HEART: regular rate & rhythm and no murmurs and no lower extremity edema ABDOMEN: soft, non-tender, non-distended, normal bowel sounds Musculoskeletal: no cyanosis of digits and no clubbing  PSYCH: alert & oriented x 3, fluent speech NEURO: no focal motor/sensory deficits  LABORATORY DATA:  I have reviewed the data as listed    Latest Ref Rng & Units 10/01/2021    2:59 PM 08/15/2021    8:22 AM 07/17/2021    8:56 AM  CBC  WBC 4.0 - 10.5 K/uL 3.6  2.9  3.0   Hemoglobin 13.0 - 17.0 g/dL 12.4  12.3  12.9   Hematocrit 39.0 - 52.0 % 37.6  37.2  39.3   Platelets 150 - 400 K/uL 132  112.0  120.0        Latest Ref Rng & Units 10/01/2021    2:59 PM 07/17/2021    8:56 AM 01/30/2020    8:48 AM  CMP  Glucose 70 - 99 mg/dL 86  84  117   BUN 8 - 23 mg/dL _0 Creatinine 0.61 - 1.24 mg/dL 1.14  1.07  1.06   Sodium 135 - 145 mmol/L 141  141  138   Potassium 3.5 - 5.1 mmol/L 3.8  4.3  4.0   Chloride 98 - 111 mmol/L  108  106  105   CO2 22 - 32 mmol/L _1 Calcium 8.9 - 10.3 mg/dL 9.7  9.0  8.8   Total Protein 6.5 - 8.1 g/dL 7.7  6.8  6.5   Total Bilirubin 0.3 - 1.2 mg/dL 0.5  0.4  0.4   Alkaline Phos 38 - 126 U/L 52  55  56   AST 15 - 41 U/L _2 ALT 0 - 44 U/L _3 RADIOGRAPHIC STUDIES: I have personally reviewed the radiological images as listed and agreed with the findings in the report. No results found.  ASSESSMENT & PLAN JAYIN DEROUSSE is a 66 y.o. male who presents to the clinic for evaluation for pancytopenia.    I reviewed potential etiologies for iron deficiency, nutritional anemias, infectious process and bone marrow disorders. Patient will proceed with labs today to check CBC, CMP, Vitamin B12, MMA, folate, iron panel, Hepatitis B and C serologies, SPEP with IFE and peripheral smear.   #Pancytopenia: --Labs today to check CBC, CMP, Vitamin B12, MMA, folate, iron panel, Hepatitis B and C serologies, SPEP with IFE and peripheral smear.CBC w/diff, CMP, B12 level, folate level, Hep B and C serologies --If above workup is negative, we will evaluate for liver disease and/or splenomegaly with abdominal US --If no evidence of liver disease or splenomegaly, we will pursue bone marrow biopsy.  --RTC based on above workup.    Orders Placed This Encounter  Procedures   CBC  with Differential ([facility] Center Only)    Standing Status:   Future    Number of Occurrences:   1    Standing Expiration Date:   10/02/2022   CMP ([facility] only)    Standing Status:   Future    Number of Occurrences:   1    Standing Expiration Date:   10/02/2022   Ferritin    Standing Status:   Future    Number of Occurrences:   1    Standing Expiration Date:   10/02/2022   Iron and Iron Binding Capacity ([facility] only)    Standing Status:   Future    Number of Occurrences:   1    Standing Expiration Date:   10/02/2022   Vitamin B12    Standing Status:   Future    Number of  Occurrences:   1    Standing Expiration Date:   10/01/2022   Methylmalonic acid, serum    Standing Status:   Future    Number of Occurrences:   1    Standing Expiration Date:   10/01/2022   Folate, Serum    Standing Status:   Future    Number of Occurrences:   1    Standing Expiration Date:   10/01/2022   Hepatitis B surface antibody    Standing Status:   Future    Number of Occurrences:   1    Standing Expiration Date:   10/01/2022   Hepatitis B surface antigen    Standing Status:   Future    Number of Occurrences:   1    Standing Expiration Date:   10/01/2022   Hepatitis B core antibody, total    Standing Status:   Future    Number of Occurrences:   1    Standing Expiration Date:   10/01/2022   Hepatitis C antibody    Standing Status:   Future    Number of Occurrences:   1    Standing Expiration Date:   10/01/2022   Multiple Myeloma Panel (SPEP&IFE w/QIG)    Standing Status:   Future    Number of Occurrences:   1    Standing Expiration Date:   10/01/2022    All questions were answered. The patient knows to call the clinic with any problems, questions or concerns.  I have spent a total of 60 minutes minutes of face-to-face and non-face-to-face time, preparing to see the patient, obtaining and/or reviewing separately obtained history, performing a medically appropriate examination, counseling and educating the patient, ordering tests/procedures, documenting clinical information in the electronic health record,and care coordination.   Dede Query, PA-C Department of Hematology/Oncology [place] at [facility] Phone: [phone]  Patient was seen with Dr. Lorenso Courier  I have read the above note and personally examined the patient. I agree with the assessment and plan as noted above.  Briefly Mr. Shirer is a 66 year old male who presents for evaluation of pancytopenia.  On 08/15/2021 the patient was noted to have a white blood cell count 2.9, hemoglobin 12.3,  and platelets of 112.  Due to concern for these findings he was referred to hematology for further evaluation and management.  At this time we will do a full pancytopenia work-up to include nutritional work-up, viral panel work-up, and inflammatory work-up.  If no clear etiology can be found would recommend pursuing an ultrasound of the liver and spleen as well as consideration of a bone marrow biopsy.  The patient voiced  understanding of the plan moving forward.   Ledell Peoples, MD Department of Hematology/Oncology [place] at [facility] Phone: [phone] Pager: [phone] Email: [email or web address]

## 2021-10-02 DIAGNOSIS — D649 Anemia, unspecified: Secondary | ICD-10-CM | POA: Insufficient documentation

## 2021-10-02 DIAGNOSIS — D61818 Other pancytopenia: Secondary | ICD-10-CM | POA: Insufficient documentation

## 2021-10-02 DIAGNOSIS — D72819 Decreased white blood cell count, unspecified: Secondary | ICD-10-CM | POA: Insufficient documentation

## 2021-10-02 DIAGNOSIS — D696 Thrombocytopenia, unspecified: Secondary | ICD-10-CM | POA: Insufficient documentation

## 2021-10-02 LAB — FERRITIN: Ferritin: 395 ng/mL — ABNORMAL HIGH (ref 24–336)

## 2021-10-03 NOTE — Procedures (Signed)
PATIENT'S NAME:  Shaun Lang, Shaun Lang DOB:      11-27-1955      MR#:    [id number]     DATE OF RECORDING: 09/28/2021 REFERRING M.D.:  Alysia Penna, NP Study Performed:   Baseline Polysomnogram HISTORY:  Shaun Lang is a 66 y.o. male , seen on 6.22.2023 upon re- referral from NP Nche for sleep consultation. This patient had been seen once before by me and underwent a HST in 02-2017, was followed by NP C. Daphine Deutscher and was non- compliant with CPAP, travelling for weeks without the device. His CPAP was called back due to non-compliance.  He is meanwhile part time working, officially retired. He still has problems to sleep through the night. He was a third shift worker for years. He watches TV in his bedroom, has little insight into sleep hygiene rules and routines, also these were more than once reviewed. He feels sleep is not refreshing, he feels as if he sleeps poorly.    [name] home sleep test was performed on an apnea link device 03-04-2017 and resulted in an overall AHI of 13.9/h with REM sleep AHI of 29.5/h and non-REM sleep AHI at 11.8/h.  Only 12.3% of the total sleep time were REM sleep time.  The patient's total recording time was 8 hours 42 minutes of which 5 hours 51 minutes were calculated to be the total sleep time.  He did not have significant oxygen saturations nadir was 85% time under 89% saturation was 4.2 minutes.  Heart rate varied between 40 and 86 bpm  His sleep diagram looked like he was up and down all night.  Snoring was recorded but only for about a quarter of the night with a mean volume of 41 dB.  It was very strongly related to supine positional sleep with an AHI of 36.5.  The AHI on his left side for example was 5.6/h.  The patient endorsed the Epworth Sleepiness Scale at 16/24 points.  FSS at 25/63. The patient's weight 183 pounds with a height of 68 (inches), resulting in a BMI of 27.7 kg/m2. The patient's neck circumference measured 16 inches.  CURRENT MEDICATIONS:  Flexeril, Neurontin, Norco, Zestril, Multivitamin, Crestor, Cialis, Maxzide-25   PROCEDURE:  This is a multichannel digital polysomnogram utilizing the Somnostar 11.2 system.  Electrodes and sensors were applied and monitored per AASM Specifications.   EEG, EOG, Chin and Limb EMG, were sampled at 200 Hz.  ECG, Snore and Nasal Pressure, Thermal Airflow, Respiratory Effort, CPAP Flow and Pressure, Oximetry was sampled at 50 Hz. Digital video and audio were recorded.      BASELINE STUDY: Lights Out was at 20:45 and Lights On at 04:58.  Total recording time (TRT) was 493 minutes, with a total sleep time (TST) of 329 minutes.   The patient's sleep latency was 72 minutes.  REM latency was only 46.5 minutes.  The sleep efficiency was 66.7 %.     SLEEP ARCHITECTURE: WASO (Wake after sleep onset) was 92 minutes.  There were 12 minutes in Stage N1, 186.5 minutes Stage N2, 50 minutes Stage N3 and 80.5 minutes in Stage REM.  The percentage of Stage N1 was 3.6%, Stage N2 was 56.7%, Stage N3 was 15.2% and Stage R (REM sleep) was 24.5%.   RESPIRATORY ANALYSIS:  There were a total of 6 respiratory events:  0 apneas and 6 hypopneas with a hypopnea index of 1.1 /hour.  The total APNEA/HYPOPNEA INDEX (AHI) was 1.1/h.  6 events occurred in REM  sleep and 0 events in NREM. The REM AHI was  4.5 /hour, versus a non-REM AHI of 0. The patient spent 18.5 minutes of total sleep time in the supine position and 311 minutes in non-supine.. The supine AHI was 0.0 versus a non-supine AHI of 1.2.  OXYGEN SATURATION & C02:  The Wake baseline 02 saturation was 96%, with the lowest being 93%. Time spent below 89% saturation equaled 0 minutes.  The arousals were noted as: 29 were spontaneous, 0 were associated with PLMs, 0 were associated with respiratory events.  The Periodic Limb Movement (PLM) index was 0 and the PLM Arousal index was 0/hour. Audio and video analysis did not show any abnormal or unusual movements, behaviors, phonations  or vocalizations.   Intermittent Snoring was associated with supine sleep, in other sleep positions snoring was very mild. [name] EKG was in keeping with normal sinus rhythm (NSR).   IMPRESSION:  No longer evidence of Obstructive Sleep Apnea (OSA), some bruxism is present, some supine snoring noted, but no frank apnea seen, no hypoxia.   RECOMMENDATIONS:  No need for intervention such as but not limited to CPAP therapy.   Avoiding supine sleep will reduce snoring.    I certify that I have reviewed the entire raw data recording prior to the issuance of this report in accordance with the Standards of Accreditation of the American Academy of Sleep Medicine (AASM)    Melvyn Novas, MD Medical Director, [facility] at [facility], American Board of Neurology and Sleep Medicine (Neurology and Sleep Medicine)

## 2021-10-03 NOTE — Progress Notes (Signed)
Aside form shift work related variable sleep times, insomnia , sleep hygiene- there is no longer any apnea seen! No need for CPAP. Follow up with NP is optional.   Intermittent Snoring was associated with supine sleep, in other sleep positions snoring was very mild. [name] EKG was in keeping with normal sinus rhythm (NSR).   IMPRESSION:  1. No longer evidence of Obstructive Sleep Apnea (OSA), some bruxism is present, some supine snoring noted, but no frank apnea seen, no hypoxia.   RECOMMENDATIONS:  1. No need for intervention such as but not limited to CPAP therapy.   2. Avoiding supine sleep will reduce snoring.

## 2021-10-04 LAB — METHYLMALONIC ACID, SERUM: Methylmalonic Acid, Quantitative: 126 nmol/L (ref 0–378)

## 2021-10-06 ENCOUNTER — Encounter: Payer: Self-pay | Admitting: Neurology

## 2021-10-06 LAB — MULTIPLE MYELOMA PANEL, SERUM
Albumin SerPl Elph-Mcnc: 3.8 g/dL (ref 2.9–4.4)
Albumin/Glob SerPl: 1.3 (ref 0.7–1.7)
Alpha 1: 0.2 g/dL (ref 0.0–0.4)
Alpha2 Glob SerPl Elph-Mcnc: 0.7 g/dL (ref 0.4–1.0)
B-Globulin SerPl Elph-Mcnc: 0.9 g/dL (ref 0.7–1.3)
Gamma Glob SerPl Elph-Mcnc: 1.2 g/dL (ref 0.4–1.8)
Globulin, Total: 3.1 g/dL (ref 2.2–3.9)
IgA: 157 mg/dL (ref 61–437)
IgG (Immunoglobin G), Serum: 1000 mg/dL (ref 603–1613)
IgM (Immunoglobulin M), Srm: 7 mg/dL — ABNORMAL LOW (ref 20–172)
Total Protein ELP: 6.9 g/dL (ref 6.0–8.5)

## 2021-10-07 ENCOUNTER — Ambulatory Visit (INDEPENDENT_AMBULATORY_CARE_PROVIDER_SITE_OTHER): Payer: Medicare Other

## 2021-10-07 DIAGNOSIS — Z0000 Encounter for general adult medical examination without abnormal findings: Secondary | ICD-10-CM | POA: Diagnosis not present

## 2021-10-07 NOTE — Progress Notes (Signed)
Subjective:   Shaun Lang is a 66 y.o. male who presents for an Subsequent  Medicare Annual Wellness Visit.   I connected with Drue Dun  today by telephone and verified that I am speaking with the correct person using two identifiers. Location patient: home Location provider: work Persons participating in the virtual visit: patient, provider.   I discussed the limitations, risks, security and privacy concerns of performing an evaluation and management service by telephone and the availability of in person appointments. I also discussed with the patient that there may be a patient responsible charge related to this service. The patient expressed understanding and verbally consented to this telephonic visit.    Interactive audio and video telecommunications were attempted between this provider and patient, however failed, due to patient having technical difficulties OR patient did not have access to video capability.  We continued and completed visit with audio only.    Review of Systems     Cardiac Risk Factors include: advanced age (>89men, >33 women);male gender     Objective:    Today's Vitals   There is no height or weight on file to calculate BMI.     10/07/2021   11:27 AM 10/01/2020    3:04 PM 09/14/2019    1:38 PM 12/05/2016   10:08 PM 10/21/2016    8:46 AM 08/21/2016    8:59 AM 07/07/2016    7:34 AM  Advanced Directives  Does Patient Have a Medical Advance Directive? No No No No No No No  Would patient like information on creating a medical advance directive? No - Patient declined No - Patient declined Yes (ED - Information included in AVS) Yes (ED - Information included in AVS) Yes (MAU/Ambulatory/Procedural Areas - Information given) Yes (MAU/Ambulatory/Procedural Areas - Information given)     Current Medications (verified) Outpatient Encounter Medications as of 10/07/2021  Medication Sig   cyclobenzaprine (FLEXERIL) 10 MG tablet TAKE 1 TABLET BY MOUTH TWICE DAILY  AS NEEDED FOR MUSCLE SPASM   gabapentin (NEURONTIN) 300 MG capsule Take 1 capsule (300 mg total) by mouth 2 (two) times daily.   HYDROcodone-acetaminophen (NORCO) 10-325 MG tablet Take 1 tablet by mouth 2 (two) times daily as needed for moderate pain. Do Not Fill Before 11/12/2021   lisinopril (ZESTRIL) 5 MG tablet Take 1 tablet (5 mg total) by mouth daily.   Multiple Vitamin (MULTIVITAMIN WITH MINERALS) TABS tablet Take 1 tablet by mouth daily.   rosuvastatin (CRESTOR) 40 MG tablet Take 1 tablet (40 mg total) by mouth daily.   tadalafil (CIALIS) 10 MG tablet Take 1 tablet (10 mg total) by mouth daily as needed for erectile dysfunction.   triamterene-hydrochlorothiazide (MAXZIDE-25) 37.5-25 MG tablet Take 1 tablet by mouth daily.   No facility-administered encounter medications on file as of 10/07/2021.    Allergies (verified) Patient has no known allergies.   History: Past Medical History:  Diagnosis Date   Bell's palsy April 2010   Chest pain with low risk for cardiac etiology    Low Risk Myoview Sept 2013   DJD (degenerative joint disease)    disabilty secondary to neck issues   Hyperlipidemia    Hypertension    Sleep apnea    does not use CPAP   Smoker    Past Surgical History:  Procedure Laterality Date   BUNIONECTOMY Left 07/15/2017   with big toe fracture repair   COLONOSCOPY  07/18/2012   polyps-Dr.Outlaw   HERNIA REPAIR  04/1998   [facility]  NECK SURGERY  07/21/02 and 01/2006   WRIST SURGERY  1992   right   Family History  Problem Relation Age of Onset   Diabetes Mother    Cancer Sister        breast   Diabetes Sister    Stroke Sister 16   Breast cancer Sister    Colon cancer Neg Hx    Esophageal cancer Neg Hx    Rectal cancer Neg Hx    Stomach cancer Neg Hx    Social History   Socioeconomic History   Marital status: Single    Spouse name: Not on file   Number of children: Not on file   Years of education: Not on file   Highest education level: Not on  file  Occupational History    Comment: unemployed  Tobacco Use   Smoking status: Every Day    Packs/day: 0.50    Years: 33.00    Total pack years: 16.50    Types: Cigarettes   Smokeless tobacco: Never  Vaping Use   Vaping Use: Never used  Substance and Sexual Activity   Alcohol use: Not Currently    Alcohol/week: 2.0 standard drinks of alcohol    Types: 2 Cans of beer per week   Drug use: No   Sexual activity: Yes    Birth control/protection: None  Other Topics Concern   Not on file  Social History Narrative   Not on file   Social Determinants of Health   Financial Resource Strain: Low Risk  (10/07/2021)   Overall Financial Resource Strain (CARDIA)    Difficulty of Paying Living Expenses: Not hard at all  Food Insecurity: No Food Insecurity (10/07/2021)   Hunger Vital Sign    Worried About Running Out of Food in the Last Year: Never true    Ran Out of Food in the Last Year: Never true  Transportation Needs: No Transportation Needs (10/07/2021)   PRAPARE - Administrator, Civil Service (Medical): No    Lack of Transportation (Non-Medical): No  Physical Activity: Inactive (10/07/2021)   Exercise Vital Sign    Days of Exercise per Week: 0 days    Minutes of Exercise per Session: 0 min  Stress: No Stress Concern Present (10/07/2021)   [employer] of Occupational Health - Occupational Stress Questionnaire    Feeling of Stress : Not at all  Social Connections: Socially Isolated (10/07/2021)   Social Connection and Isolation Panel [NHANES]    Frequency of Communication with Friends and Family: Three times a week    Frequency of Social Gatherings with Friends and Family: Three times a week    Attends Religious Services: Never    Active Member of Clubs or Organizations: No    Attends Engineer, structural: Never    Marital Status: Divorced    Tobacco Counseling Ready to quit: Not Answered Counseling given: Not Answered   Clinical  Intake:  Pre-visit preparation completed: Yes  Pain : No/denies pain     Nutritional Risks: None Diabetes: No  How often do you need to have someone help you when you read instructions, pamphlets, or other written materials from your doctor or pharmacy?: 1 - Never What is the last grade level you completed in school?: College  Diabetic?no   Interpreter Needed?: No  Information entered by :: L.Janaria Mccammon,LPn   Activities of Daily Living    10/07/2021   11:30 AM  In your present state of health, do you have any difficulty  performing the following activities:  Hearing? 0  Vision? 0  Difficulty concentrating or making decisions? 0  Walking or climbing stairs? 0  Dressing or bathing? 0  Doing errands, shopping? 0  Preparing Food and eating ? N  Using the Toilet? N  In the past six months, have you accidently leaked urine? N  Do you have problems with loss of bowel control? N  Managing your Medications? N  Managing your Finances? N  Housekeeping or managing your Housekeeping? N    Patient Care Team: Nche, Bonna Gains, NP as PCP - General (Internal Medicine)  Indicate any recent Medical Services you may have received from other than [facility] providers in the past year (date may be approximate).     Assessment:   This is a routine wellness examination for [facility].  Hearing/Vision screen Vision Screening - Comments:: Annual eye exams wear glasses   Dietary issues and exercise activities discussed: Current Exercise Habits: The patient does not participate in regular exercise at present, Exercise limited by: None identified   Goals Addressed   None    Depression Screen    10/07/2021   11:28 AM 10/07/2021   11:26 AM 09/22/2021    1:22 PM 07/23/2021   10:03 AM 07/14/2021    1:41 PM 05/28/2021    9:47 AM 04/03/2021    9:39 AM  PHQ 2/9 Scores  PHQ - 2 Score 0 0 0 0 0 0 0  PHQ- 9 Score     5      Fall Risk    10/07/2021   11:27 AM 09/22/2021    1:22 PM 07/23/2021   10:03 AM  05/28/2021    9:47 AM 04/03/2021    9:39 AM  Fall Risk   Falls in the past year? 0 0 0 0 0  Number falls in past yr: 0 0 0 0 0  Injury with Fall? 0  0  0  Follow up Falls evaluation completed;Education provided        FALL RISK PREVENTION PERTAINING TO THE HOME:  Any stairs in or around the home? Yes  If so, are there any without handrails? No  Home free of loose throw rugs in walkways, pet beds, electrical cords, etc? Yes  Adequate lighting in your home to reduce risk of falls? Yes   ASSISTIVE DEVICES UTILIZED TO PREVENT FALLS:  Life alert? No  Use of a cane, walker or w/c? No  Grab bars in the bathroom? No  Shower chair or bench in shower? No  Elevated toilet seat or a handicapped toilet? No     Cognitive Function:  Normal cognitive status assessed by telephone conversation  by this Nurse Health Advisor. No abnormalities found.        09/14/2019    1:45 PM  6CIT Screen  What Year? 0 points  What month? 0 points  What time? 0 points  Count back from 20 0 points  Months in reverse 0 points  Repeat phrase 0 points  Total Score 0 points    Immunizations Immunization History  Administered Date(s) Administered   Moderna SARS-COV2 Booster Vaccination 05/22/2020   PFIZER(Purple Top)SARS-COV-2 Vaccination 05/28/2019, 06/25/2019    TDAP status: Due, Education has been provided regarding the importance of this vaccine. Advised may receive this vaccine at local pharmacy or Health Dept. Aware to provide a copy of the vaccination record if obtained from local pharmacy or Health Dept. Verbalized acceptance and understanding.  Flu Vaccine status: Declined, Education has been provided regarding  the importance of this vaccine but patient still declined. Advised may receive this vaccine at local pharmacy or Health Dept. Aware to provide a copy of the vaccination record if obtained from local pharmacy or Health Dept. Verbalized acceptance and understanding.  Pneumococcal vaccine  status: Due, Education has been provided regarding the importance of this vaccine. Advised may receive this vaccine at local pharmacy or Health Dept. Aware to provide a copy of the vaccination record if obtained from local pharmacy or Health Dept. Verbalized acceptance and understanding.  Covid-19 vaccine status: Completed vaccines  Qualifies for Shingles Vaccine? Yes   Zostavax completed No   Shingrix Completed?: No.    Education has been provided regarding the importance of this vaccine. Patient has been advised to call insurance company to determine out of pocket expense if they have not yet received this vaccine. Advised may also receive vaccine at local pharmacy or Health Dept. Verbalized acceptance and understanding.  Screening Tests Health Maintenance  Topic Date Due   COVID-19 Vaccine (3 - Pfizer risk series) 06/19/2020   Zoster Vaccines- Shingrix (1 of 2) 10/13/2021 (Originally 05/19/1974)   Pneumonia Vaccine 55+ Years old (1 - PCV) 07/15/2022 (Originally 05/19/1961)   INFLUENZA VACCINE  10/21/2021   TETANUS/TDAP  11/21/2024   COLONOSCOPY (Pts 45-9yrs Insurance coverage will need to be confirmed)  11/02/2027   Hepatitis C Screening  Completed   HPV VACCINES  Aged Out    Health Maintenance  Health Maintenance Due  Topic Date Due   COVID-19 Vaccine (3 - Pfizer risk series) 06/19/2020    Colorectal cancer screening: Type of screening: Colonoscopy. Completed 11/01/2017. Repeat every 10 years  Lung Cancer Screening: (Low Dose CT Chest recommended if Age 56-80 years, 30 pack-year currently smoking OR have quit w/in 15years.) does not qualify.   Lung Cancer Screening Referral: n/a  Additional Screening:  Hepatitis C Screening: does not qualify;   Vision Screening: Recommended annual ophthalmology exams for early detection of glaucoma and other disorders of the eye. Is the patient up to date with their annual eye exam?  Yes  Who is the provider or what is the name of the  office in which the patient attends annual eye exams? [facility]  If pt is not established with a provider, would they like to be referred to a provider to establish care? No .   Dental Screening: Recommended annual dental exams for proper oral hygiene  Community Resource Referral / Chronic Care Management: CRR required this visit?  No   CCM required this visit?  No      Plan:     I have personally reviewed and noted the following in the patient's chart:   Medical and social history Use of alcohol, tobacco or illicit drugs  Current medications and supplements including opioid prescriptions. Patient is currently taking opioid prescriptions. Information provided to patient regarding non-opioid alternatives. Patient advised to discuss non-opioid treatment plan with their provider. Functional ability and status Nutritional status Physical activity Advanced directives List of other physicians Hospitalizations, surgeries, and ER visits in previous 12 months Vitals Screenings to include cognitive, depression, and falls Referrals and appointments  In addition, I have reviewed and discussed with patient certain preventive protocols, quality metrics, and best practice recommendations. A written personalized care plan for preventive services as well as general preventive health recommendations were provided to patient.     March Rummage, LPN   4/48/1856   Nurse Notes: none

## 2021-10-07 NOTE — Patient Instructions (Signed)
Shaun Lang , Thank you for taking time to come for your Medicare Wellness Visit. I appreciate your ongoing commitment to your health goals. Please review the following plan we discussed and let me know if I can assist you in the future.   Screening recommendations/referrals: Colonoscopy: 11/01/2017 Recommended yearly ophthalmology/optometry visit for glaucoma screening and checkup Recommended yearly dental visit for hygiene and checkup  Vaccinations: Influenza vaccine: declined  Pneumococcal vaccine: due  Tdap vaccine: due  Shingles vaccine: will consider     Advanced directives: none   Conditions/risks identified: none   Next appointment: none   Preventive Care 65 Years and Older, Male Preventive care refers to lifestyle choices and visits with your health care provider that can promote health and wellness. What does preventive care include? A yearly physical exam. This is also called an annual well check. Dental exams once or twice a year. Routine eye exams. Ask your health care provider how often you should have your eyes checked. Personal lifestyle choices, including: Daily care of your teeth and gums. Regular physical activity. Eating a healthy diet. Avoiding tobacco and drug use. Limiting alcohol use. Practicing safe sex. Taking low doses of aspirin every day. Taking vitamin and mineral supplements as recommended by your health care provider. What happens during an annual well check? The services and screenings done by your health care provider during your annual well check will depend on your age, overall health, lifestyle risk factors, and family history of disease. Counseling  Your health care provider may ask you questions about your: Alcohol use. Tobacco use. Drug use. Emotional well-being. Home and relationship well-being. Sexual activity. Eating habits. History of falls. Memory and ability to understand (cognition). Work and work Astronomer. Screening   You may have the following tests or measurements: Height, weight, and BMI. Blood pressure. Lipid and cholesterol levels. These may be checked every 5 years, or more frequently if you are over 52 years old. Skin check. Lung cancer screening. You may have this screening every year starting at age 30 if you have a 30-pack-year history of smoking and currently smoke or have quit within the past 15 years. Fecal occult blood test (FOBT) of the stool. You may have this test every year starting at age 52. Flexible sigmoidoscopy or colonoscopy. You may have a sigmoidoscopy every 5 years or a colonoscopy every 10 years starting at age 18. Prostate cancer screening. Recommendations will vary depending on your family history and other risks. Hepatitis C blood test. Hepatitis B blood test. Sexually transmitted disease (STD) testing. Diabetes screening. This is done by checking your blood sugar (glucose) after you have not eaten for a while (fasting). You may have this done every 1-3 years. Abdominal aortic aneurysm (AAA) screening. You may need this if you are a current or former smoker. Osteoporosis. You may be screened starting at age 52 if you are at high risk. Talk with your health care provider about your test results, treatment options, and if necessary, the need for more tests. Vaccines  Your health care provider may recommend certain vaccines, such as: Influenza vaccine. This is recommended every year. Tetanus, diphtheria, and acellular pertussis (Tdap, Td) vaccine. You may need a Td booster every 10 years. Zoster vaccine. You may need this after age 86. Pneumococcal 13-valent conjugate (PCV13) vaccine. One dose is recommended after age 25. Pneumococcal polysaccharide (PPSV23) vaccine. One dose is recommended after age 12. Talk to your health care provider about which screenings and vaccines you need and how often  you need them. This information is not intended to replace advice given to you by  your health care provider. Make sure you discuss any questions you have with your health care provider. Document Released: 04/05/2015 Document Revised: 11/27/2015 Document Reviewed: 01/08/2015 Elsevier Interactive Patient Education  2017 [place] Prevention in the Home Falls can cause injuries. They can happen to people of all ages. There are many things you can do to make your home safe and to help prevent falls. What can I do on the outside of my home? Regularly fix the edges of walkways and driveways and fix any cracks. Remove anything that might make you trip as you walk through a door, such as a raised step or threshold. Trim any bushes or trees on the path to your home. Use bright outdoor lighting. Clear any walking paths of anything that might make someone trip, such as rocks or tools. Regularly check to see if handrails are loose or broken. Make sure that both sides of any steps have handrails. Any raised decks and porches should have guardrails on the edges. Have any leaves, snow, or ice cleared regularly. Use sand or salt on walking paths during winter. Clean up any spills in your garage right away. This includes oil or grease spills. What can I do in the bathroom? Use night lights. Install grab bars by the toilet and in the tub and shower. Do not use towel bars as grab bars. Use non-skid mats or decals in the tub or shower. If you need to sit down in the shower, use a plastic, non-slip stool. Keep the floor dry. Clean up any water that spills on the floor as soon as it happens. Remove soap buildup in the tub or shower regularly. Attach bath mats securely with double-sided non-slip rug tape. Do not have throw rugs and other things on the floor that can make you trip. What can I do in the bedroom? Use night lights. Make sure that you have a light by your bed that is easy to reach. Do not use any sheets or blankets that are too big for your bed. They should not hang  down onto the floor. Have a firm chair that has side arms. You can use this for support while you get dressed. Do not have throw rugs and other things on the floor that can make you trip. What can I do in the kitchen? Clean up any spills right away. Avoid walking on wet floors. Keep items that you use a lot in easy-to-reach places. If you need to reach something above you, use a strong step stool that has a grab bar. Keep electrical cords out of the way. Do not use floor polish or wax that makes floors slippery. If you must use wax, use non-skid floor wax. Do not have throw rugs and other things on the floor that can make you trip. What can I do with my stairs? Do not leave any items on the stairs. Make sure that there are handrails on both sides of the stairs and use them. Fix handrails that are broken or loose. Make sure that handrails are as long as the stairways. Check any carpeting to make sure that it is firmly attached to the stairs. Fix any carpet that is loose or worn. Avoid having throw rugs at the top or bottom of the stairs. If you do have throw rugs, attach them to the floor with carpet tape. Make sure that you have a light  switch at the top of the stairs and the bottom of the stairs. If you do not have them, ask someone to add them for you. What else can I do to help prevent falls? Wear shoes that: Do not have high heels. Have rubber bottoms. Are comfortable and fit you well. Are closed at the toe. Do not wear sandals. If you use a stepladder: Make sure that it is fully opened. Do not climb a closed stepladder. Make sure that both sides of the stepladder are locked into place. Ask someone to hold it for you, if possible. Clearly mark and make sure that you can see: Any grab bars or handrails. First and last steps. Where the edge of each step is. Use tools that help you move around (mobility aids) if they are needed. These  include: Canes. Walkers. Scooters. Crutches. Turn on the lights when you go into a dark area. Replace any light bulbs as soon as they burn out. Set up your furniture so you have a clear path. Avoid moving your furniture around. If any of your floors are uneven, fix them. If there are any pets around you, be aware of where they are. Review your medicines with your doctor. Some medicines can make you feel dizzy. This can increase your chance of falling. Ask your doctor what other things that you can do to help prevent falls. This information is not intended to replace advice given to you by your health care provider. Make sure you discuss any questions you have with your health care provider. Document Released: 01/03/2009 Document Revised: 08/15/2015 Document Reviewed: 04/13/2014 Elsevier Interactive Patient Education  2017 [employer].

## 2021-10-13 ENCOUNTER — Telehealth: Payer: Self-pay | Admitting: Physician Assistant

## 2021-10-13 DIAGNOSIS — D696 Thrombocytopenia, unspecified: Secondary | ICD-10-CM

## 2021-10-13 NOTE — Telephone Encounter (Signed)
I spoke to [name] to review lab results from 10/01/2021.  Findings show mild pancytopenia with white blood cell count 3.6, hemoglobin 12.4 and platelet count 132.  Shows no evidence of nutritional deficiencies, iron deficiency or paraproteinemia.  With mild elevated ferritin levels, we recommend obtaining an abdominal ultrasound to evaluate for liver disease and or splenomegaly.  Patient is agreement with the plan.  We will follow-up once we review the ultrasound results to discuss next steps.

## 2021-10-20 ENCOUNTER — Ambulatory Visit (HOSPITAL_COMMUNITY)
Admission: RE | Admit: 2021-10-20 | Discharge: 2021-10-20 | Disposition: A | Discharge status: Home / Self Care | Payer: Medicare Other | Source: Ambulatory Visit | Attending: Physician Assistant | Admitting: Physician Assistant

## 2021-10-20 DIAGNOSIS — D696 Thrombocytopenia, unspecified: Secondary | ICD-10-CM | POA: Insufficient documentation

## 2021-10-22 ENCOUNTER — Other Ambulatory Visit: Payer: Self-pay | Admitting: *Deleted

## 2021-10-22 NOTE — Patient Outreach (Signed)
  Care Coordination   10/22/2021 Name: Shaun Lang MRN: [id number] DOB: 08-27-1955   Care Coordination Outreach Attempts:  An unsuccessful telephone outreach was attempted today to offer the patient information about available care coordination services as a benefit of their health plan.   Follow Up Plan:  Additional outreach attempts will be made to offer the patient care coordination information and services.   Encounter Outcome:  No Answer  Care Coordination Interventions Activated:  No   Care Coordination Interventions:  No, not indicated    Blanchie Serve RN, BSN [facility] Care Management [facility] [phone] [email or web address]

## 2021-10-27 ENCOUNTER — Telehealth: Payer: Self-pay | Admitting: Physician Assistant

## 2021-10-27 DIAGNOSIS — D61818 Other pancytopenia: Secondary | ICD-10-CM

## 2021-10-27 NOTE — Telephone Encounter (Signed)
I spoke to [name] to review the abdominal US results from 10/20/2021. Findings show no evidence of liver disease or splenomegaly. Recommend bone marrow biopsy to further evaluate pancytopenia. Patient agreed and we will schedule a follow up 2-3 days after biopsy to review results. Patient expressed understanding of the plan provided.

## 2021-11-16 ENCOUNTER — Other Ambulatory Visit: Payer: Self-pay | Admitting: Radiology

## 2021-11-16 DIAGNOSIS — D61818 Other pancytopenia: Secondary | ICD-10-CM

## 2021-11-17 ENCOUNTER — Other Ambulatory Visit: Payer: Self-pay | Admitting: Internal Medicine

## 2021-11-18 ENCOUNTER — Encounter (HOSPITAL_COMMUNITY): Payer: Self-pay

## 2021-11-18 ENCOUNTER — Other Ambulatory Visit: Payer: Self-pay

## 2021-11-18 ENCOUNTER — Ambulatory Visit (HOSPITAL_COMMUNITY)
Admission: RE | Admit: 2021-11-18 | Discharge: 2021-11-18 | Disposition: A | Discharge status: Home / Self Care | Payer: Medicare Other | Source: Ambulatory Visit | Attending: Physician Assistant | Admitting: Physician Assistant

## 2021-11-18 VITALS — BP 152/89 | HR 66 | Temp 97.8°F | Resp 16 | Ht 68.0 in | Wt 180.0 lb

## 2021-11-18 DIAGNOSIS — M545 Low back pain, unspecified: Secondary | ICD-10-CM | POA: Insufficient documentation

## 2021-11-18 DIAGNOSIS — Z1379 Encounter for other screening for genetic and chromosomal anomalies: Secondary | ICD-10-CM | POA: Diagnosis not present

## 2021-11-18 DIAGNOSIS — E785 Hyperlipidemia, unspecified: Secondary | ICD-10-CM | POA: Insufficient documentation

## 2021-11-18 DIAGNOSIS — D61818 Other pancytopenia: Secondary | ICD-10-CM | POA: Insufficient documentation

## 2021-11-18 DIAGNOSIS — D72819 Decreased white blood cell count, unspecified: Secondary | ICD-10-CM | POA: Insufficient documentation

## 2021-11-18 DIAGNOSIS — D696 Thrombocytopenia, unspecified: Secondary | ICD-10-CM | POA: Diagnosis not present

## 2021-11-18 DIAGNOSIS — D649 Anemia, unspecified: Secondary | ICD-10-CM | POA: Diagnosis not present

## 2021-11-18 DIAGNOSIS — F17210 Nicotine dependence, cigarettes, uncomplicated: Secondary | ICD-10-CM | POA: Diagnosis not present

## 2021-11-18 DIAGNOSIS — I10 Essential (primary) hypertension: Secondary | ICD-10-CM | POA: Insufficient documentation

## 2021-11-18 LAB — CBC WITH DIFFERENTIAL/PLATELET
Abs Immature Granulocytes: 0 10*3/uL (ref 0.00–0.07)
Basophils Absolute: 0 10*3/uL (ref 0.0–0.1)
Basophils Relative: 1 %
Eosinophils Absolute: 0.1 10*3/uL (ref 0.0–0.5)
Eosinophils Relative: 2 %
HCT: 39.8 % (ref 39.0–52.0)
Hemoglobin: 12.7 g/dL — ABNORMAL LOW (ref 13.0–17.0)
Immature Granulocytes: 0 %
Lymphocytes Relative: 39 %
Lymphs Abs: 1.1 10*3/uL (ref 0.7–4.0)
MCH: 28.9 pg (ref 26.0–34.0)
MCHC: 31.9 g/dL (ref 30.0–36.0)
MCV: 90.5 fL (ref 80.0–100.0)
Monocytes Absolute: 0.3 10*3/uL (ref 0.1–1.0)
Monocytes Relative: 10 %
Neutro Abs: 1.4 10*3/uL — ABNORMAL LOW (ref 1.7–7.7)
Neutrophils Relative %: 48 %
Platelets: 124 10*3/uL — ABNORMAL LOW (ref 150–400)
RBC: 4.4 MIL/uL (ref 4.22–5.81)
RDW: 14.6 % (ref 11.5–15.5)
WBC: 2.9 10*3/uL — ABNORMAL LOW (ref 4.0–10.5)
nRBC: 0 % (ref 0.0–0.2)

## 2021-11-18 MED ORDER — NALOXONE HCL 0.4 MG/ML IJ SOLN
INTRAMUSCULAR | Status: AC
  Filled 2021-11-18: qty 1

## 2021-11-18 MED ORDER — FENTANYL CITRATE (PF) 100 MCG/2ML IJ SOLN
INTRAMUSCULAR | Status: AC | PRN
  Administered 2021-11-18 (×2): 50 ug via INTRAVENOUS

## 2021-11-18 MED ORDER — MIDAZOLAM HCL 2 MG/2ML IJ SOLN
INTRAMUSCULAR | Status: AC | PRN
  Administered 2021-11-18 (×2): 1 mg via INTRAVENOUS

## 2021-11-18 MED ORDER — FENTANYL CITRATE (PF) 100 MCG/2ML IJ SOLN
INTRAMUSCULAR | Status: AC
  Filled 2021-11-18: qty 2

## 2021-11-18 MED ORDER — SODIUM CHLORIDE 0.9 % IV SOLN: INTRAVENOUS | Status: DC

## 2021-11-18 MED ORDER — FLUMAZENIL 0.5 MG/5ML IV SOLN
INTRAVENOUS | Status: AC
  Filled 2021-11-18: qty 5

## 2021-11-18 MED ORDER — MIDAZOLAM HCL 2 MG/2ML IJ SOLN
INTRAMUSCULAR | Status: AC
  Filled 2021-11-18: qty 2

## 2021-11-18 NOTE — Progress Notes (Signed)
Call to patient's designated contact - Son - Minerva Areola. Discussed timeframes for pre/intra and post procedure. Verbalized understanding. Aware that call will be made post procedure to discuss pick up time and any further discharge instructions.

## 2021-11-18 NOTE — H&P (Signed)
Chief Complaint: Pancytopenia. Request is for bone marrow biopsy  Referring Physician(s): Lincoln Brigham  Supervising Physician: Michaelle Birks  Patient Status: [facility] - Out-pt  History of Present Illness: Shaun Lang is a 66 y.o. male 66 y.o. male outpatient. Smoker. History of HTN, HLD. Found to have chronic leukopenia and intermittent anemia.  Team is requesting bone marrow biopsy for further evaluation of pancytopenia.  Patient alert and laying in bed. Endorses lower back pain which he states is baseline Denies any fevers, headache, chest pain, SOB, cough, abdominal pain, nausea, vomiting or bleeding. Return precautions and treatment recommendations and follow-up discussed with the patient  who is agreeable with the plan.    Past Medical History:  Diagnosis Date   Bell's palsy April 2010   Chest pain with low risk for cardiac etiology    Low Risk Myoview Sept 2013   DJD (degenerative joint disease)    disabilty secondary to neck issues   Hyperlipidemia    Hypertension    Sleep apnea    does not use CPAP   Smoker     Past Surgical History:  Procedure Laterality Date   BUNIONECTOMY Left 07/15/2017   with big toe fracture repair   COLONOSCOPY  07/18/2012   polyps-Dr.Outlaw   HERNIA REPAIR  04/1998   [facility]   NECK SURGERY  07/21/02 and 01/2006   WRIST SURGERY  1992   right    Allergies: Patient has no known allergies.  Medications: Prior to Admission medications   Medication Sig Start Date End Date Taking? Authorizing Provider  cyclobenzaprine (FLEXERIL) 10 MG tablet TAKE 1 TABLET BY MOUTH TWICE DAILY AS NEEDED FOR MUSCLE SPASM 02/05/21  Yes Bayard Hugger, NP  gabapentin (NEURONTIN) 300 MG capsule Take 1 capsule (300 mg total) by mouth 2 (two) times daily. 02/05/21  Yes Bayard Hugger, NP  HYDROcodone-acetaminophen (NORCO) 10-325 MG tablet Take 1 tablet by mouth 2 (two) times daily as needed for moderate pain. Do Not Fill Before 11/12/2021 09/22/21  Yes Bayard Hugger, NP  Multiple Vitamin (MULTIVITAMIN WITH MINERALS) TABS tablet Take 1 tablet by mouth daily.   Yes [provider]  triamterene-hydrochlorothiazide (MAXZIDE-25) 37.5-25 MG tablet Take 1 tablet by mouth daily. 07/18/21  Yes Nche, Charlene Brooke, NP  lisinopril (ZESTRIL) 5 MG tablet Take 1 tablet (5 mg total) by mouth daily. 07/18/21   Nche, Charlene Brooke, NP  rosuvastatin (CRESTOR) 40 MG tablet Take 1 tablet (40 mg total) by mouth daily. 07/18/21   Nche, Charlene Brooke, NP  tadalafil (CIALIS) 10 MG tablet Take 1 tablet (10 mg total) by mouth daily as needed for erectile dysfunction. 07/14/21   Nche, Charlene Brooke, NP     Family History  Problem Relation Age of Onset   Diabetes Mother    Cancer Sister        breast   Diabetes Sister    Stroke Sister 54   Breast cancer Sister    Colon cancer Neg Hx    Esophageal cancer Neg Hx    Rectal cancer Neg Hx    Stomach cancer Neg Hx     Social History   Socioeconomic History   Marital status: Single    Spouse name: Not on file   Number of children: Not on file   Years of education: Not on file   Highest education level: Not on file  Occupational History    Comment: unemployed  Tobacco Use   Smoking status: Every Day  Packs/day: 0.50    Years: 33.00    Total pack years: 16.50    Types: Cigarettes   Smokeless tobacco: Never  Vaping Use   Vaping Use: Never used  Substance and Sexual Activity   Alcohol use: Not Currently    Alcohol/week: 2.0 standard drinks of alcohol    Types: 2 Cans of beer per week   Drug use: No   Sexual activity: Yes    Birth control/protection: None  Other Topics Concern   Not on file  Social History Narrative   Not on file   Social Determinants of Health   Financial Resource Strain: Low Risk  (10/07/2021)   Overall Financial Resource Strain (CARDIA)    Difficulty of Paying Living Expenses: Not hard at all  Food Insecurity: No Food Insecurity (10/07/2021)   Hunger Vital Sign    Worried  About Running Out of Food in the Last Year: Never true    Ran Out of Food in the Last Year: Never true  Transportation Needs: No Transportation Needs (10/07/2021)   PRAPARE - Hydrologist (Medical): No    Lack of Transportation (Non-Medical): No  Physical Activity: Inactive (10/07/2021)   Exercise Vital Sign    Days of Exercise per Week: 0 days    Minutes of Exercise per Session: 0 min  Stress: No Stress Concern Present (10/07/2021)   [place]    Feeling of Stress : Not at all  Social Connections: Socially Isolated (10/07/2021)   Social Connection and Isolation Panel [NHANES]    Frequency of Communication with Friends and Family: Three times a week    Frequency of Social Gatherings with Friends and Family: Three times a week    Attends Religious Services: Never    Active Member of Clubs or Organizations: No    Attends Archivist Meetings: Never    Marital Status: Divorced     Review of Systems: A 12 point ROS discussed and pertinent positives are indicated in the HPI above.  All other systems are negative.  Review of Systems  Constitutional:  Negative for fever.  HENT:  Negative for congestion.   Respiratory:  Negative for cough and shortness of breath.   Cardiovascular:  Negative for chest pain.  Gastrointestinal:  Negative for abdominal pain.  Musculoskeletal:  Positive for back pain.  Neurological:  Negative for headaches.  Psychiatric/Behavioral:  Negative for behavioral problems and confusion.     Vital Signs: BP (!) 157/82   Pulse 65   Temp 98 F (36.7 C) (Oral)   Resp 16   Ht 5' 8"  (1.727 m)   Wt 180 lb (81.6 kg)   SpO2 100%   BMI 27.37 kg/m     Physical Exam Vitals and nursing note reviewed.  Constitutional:      Appearance: He is well-developed.  HENT:     Head: Normocephalic.  Cardiovascular:     Pulses: Normal pulses.     Heart sounds: Normal  heart sounds.  Pulmonary:     Effort: Pulmonary effort is normal.     Breath sounds: Normal breath sounds.  Musculoskeletal:        General: Normal range of motion.     Cervical back: Normal range of motion.  Skin:    General: Skin is dry.  Neurological:     Mental Status: He is alert and oriented to person, place, and time.     Imaging: US Abdomen Complete  Result Date: 10/20/2021 CLINICAL DATA:  Thrombocytopenia. Evaluate for liver disease and or splenomegaly. EXAM: ABDOMEN ULTRASOUND COMPLETE COMPARISON:  None Available. FINDINGS: Gallbladder: No gallstones or wall thickening visualized. No sonographic Murphy sign noted by sonographer. Common bile duct: Diameter: 3.0 Liver: No focal lesion identified. Within normal limits in parenchymal echogenicity. Portal vein is patent on color Doppler imaging with normal direction of blood flow towards the liver. IVC: No abnormality visualized. Pancreas: Visualized portion unremarkable. Spleen: Size and appearance within normal limits. Right Kidney: Length: 10 cm. Echogenicity within normal limits. No mass or hydronephrosis visualized. Left Kidney: Length: 9.6 cm. Echogenicity within normal limits. No mass or hydronephrosis visualized. Abdominal aorta: No aneurysm visualized. Other findings: None. IMPRESSION: Normal exam. No findings to suggest hepatosplenomegaly. Electronically Signed   By: Kerby Moors M.D.   On: 10/20/2021 08:17    Labs:  CBC: Recent Labs    07/17/21 0856 08/15/21 0822 10/01/21 1459  WBC 3.0* 2.9* 3.6*  HGB 12.9* 12.3* 12.4*  HCT 39.3 37.2* 37.6*  PLT 120.0* 112.0* 132*    COAGS: No results for input(s): "INR", "APTT" in the last 8760 hours.  BMP: Recent Labs    07/17/21 0856 10/01/21 1459  NA 141 141  K 4.3 3.8  CL 106 108  CO2 27 27  GLUCOSE 84 86  BUN 16 16  CALCIUM 9.0 9.7  CREATININE 1.07 1.14  GFRNONAA  --  >60    LIVER FUNCTION TESTS: Recent Labs    07/17/21 0856 10/01/21 1459  BILITOT 0.4  0.5  AST 17 20  ALT 16 20  ALKPHOS 55 52  PROT 6.8 7.7  ALBUMIN 4.5 4.5      Assessment and Plan:  66 y.o. male outpatient. Smoker. History of HTN, HLD. Found to have chronic leukopenia and intermittent anemia.  Team is requesting bone marrow biopsy for further evaluation of pancytopenia.   Labs from 8.29.23 pending. Medications are within acceptable parameters. NKDA. Patient has been NPO since midnight.  Risks and benefits of bone marrow biopsy was discussed with the patient and/or patient's family including, but not limited to bleeding, infection, damage to adjacent structures or low yield requiring additional tests.  All of the questions were answered and there is agreement to proceed.  Consent signed and in chart.   Thank you for this interesting consult.  I greatly enjoyed meeting BRANDAN ROBICHEAUX and look forward to participating in their care.  A copy of this report was sent to the requesting provider on this date.  Electronically Signed: Jacqualine Mau, NP 11/18/2021, 7:47 AM   I spent a total of  30 Minutes   in face to face in clinical consultation, greater than 50% of which was counseling/coordinating care for portacath placement

## 2021-11-18 NOTE — Procedures (Signed)
Vascular and Interventional Radiology Procedure Note  Patient: Shaun Lang DOB: [birth year] Medical Record Number: [id number] Note Date/Time: 11/18/21 9:43 AM   Performing Physician: Roanna Banning, MD Assistant(s): None  Diagnosis: Leukopenia, Anemia  Procedure: BONE MARROW ASPIRATION and BIOPSY  Anesthesia: Conscious Sedation Complications: None Estimated Blood Loss: Minimal Specimens: Sent for Pathology  Findings:  Successful CT-guided bone marrow aspiration and biopsy A total of 1 cores were obtained. Hemostasis of the tract was achieved using Manual Pressure.  Plan: Bed rest for 1 hours.  See detailed procedure note with images in PACS. The patient tolerated the procedure well without incident or complication and was returned to Recovery in stable condition.    Roanna Banning, MD [facility] Interventional [facility]iology [facility] Radiology   Pager. [phone] Clinic. [phone]

## 2021-11-18 NOTE — Discharge Instructions (Signed)

## 2021-11-20 LAB — SURGICAL PATHOLOGY

## 2021-11-26 ENCOUNTER — Encounter (HOSPITAL_COMMUNITY): Payer: Self-pay | Admitting: Physician Assistant

## 2021-11-27 ENCOUNTER — Other Ambulatory Visit: Payer: Self-pay | Admitting: Physician Assistant

## 2021-11-27 ENCOUNTER — Encounter (HOSPITAL_COMMUNITY): Payer: Self-pay

## 2021-11-27 DIAGNOSIS — D61818 Other pancytopenia: Secondary | ICD-10-CM

## 2021-11-28 ENCOUNTER — Inpatient Hospital Stay: Payer: Medicare Other | Attending: Physician Assistant

## 2021-11-28 ENCOUNTER — Inpatient Hospital Stay (HOSPITAL_BASED_OUTPATIENT_CLINIC_OR_DEPARTMENT_OTHER): Payer: Medicare Other | Admitting: Physician Assistant

## 2021-11-28 VITALS — BP 158/91 | HR 70 | Temp 98.1°F | Resp 16 | Ht 68.0 in | Wt 183.0 lb

## 2021-11-28 DIAGNOSIS — F17210 Nicotine dependence, cigarettes, uncomplicated: Secondary | ICD-10-CM | POA: Diagnosis not present

## 2021-11-28 DIAGNOSIS — D61818 Other pancytopenia: Secondary | ICD-10-CM | POA: Diagnosis not present

## 2021-11-28 LAB — CBC WITH DIFFERENTIAL (CANCER CENTER ONLY)
Abs Immature Granulocytes: 0 10*3/uL (ref 0.00–0.07)
Basophils Absolute: 0 10*3/uL (ref 0.0–0.1)
Basophils Relative: 1 %
Eosinophils Absolute: 0.1 10*3/uL (ref 0.0–0.5)
Eosinophils Relative: 3 %
HCT: 35.3 % — ABNORMAL LOW (ref 39.0–52.0)
Hemoglobin: 11.8 g/dL — ABNORMAL LOW (ref 13.0–17.0)
Immature Granulocytes: 0 %
Lymphocytes Relative: 46 %
Lymphs Abs: 1.6 10*3/uL (ref 0.7–4.0)
MCH: 29.1 pg (ref 26.0–34.0)
MCHC: 33.4 g/dL (ref 30.0–36.0)
MCV: 87.2 fL (ref 80.0–100.0)
Monocytes Absolute: 0.3 10*3/uL (ref 0.1–1.0)
Monocytes Relative: 7 %
Neutro Abs: 1.6 10*3/uL — ABNORMAL LOW (ref 1.7–7.7)
Neutrophils Relative %: 43 %
Platelet Count: 123 10*3/uL — ABNORMAL LOW (ref 150–400)
RBC: 4.05 MIL/uL — ABNORMAL LOW (ref 4.22–5.81)
RDW: 13.8 % (ref 11.5–15.5)
WBC Count: 3.6 10*3/uL — ABNORMAL LOW (ref 4.0–10.5)
nRBC: 0 % (ref 0.0–0.2)

## 2021-11-28 LAB — CMP (CANCER CENTER ONLY)
ALT: 11 U/L (ref 0–44)
AST: 16 U/L (ref 15–41)
Albumin: 4.1 g/dL (ref 3.5–5.0)
Alkaline Phosphatase: 47 U/L (ref 38–126)
Anion gap: 3 — ABNORMAL LOW (ref 5–15)
BUN: 16 mg/dL (ref 8–23)
CO2: 26 mmol/L (ref 22–32)
Calcium: 8.7 mg/dL — ABNORMAL LOW (ref 8.9–10.3)
Chloride: 108 mmol/L (ref 98–111)
Creatinine: 1.1 mg/dL (ref 0.61–1.24)
GFR, Estimated: >60 mL/min (ref >60)
Glucose, Bld: 115 mg/dL — ABNORMAL HIGH (ref 70–99)
Potassium: 3.7 mmol/L (ref 3.5–5.1)
Sodium: 137 mmol/L (ref 135–145)
Total Bilirubin: 0.4 mg/dL (ref 0.3–1.2)
Total Protein: 6.7 g/dL (ref 6.5–8.1)

## 2021-11-30 NOTE — Progress Notes (Signed)
[place] [phone]   [phone]  PROGRESS NOTE  Patient Care Team: Nche, Charlene Brooke, NP as PCP - General (Internal Medicine)  CHIEF COMPLAINTS/PURPOSE OF CONSULTATION:  Pancytopenia  HISTORY OF PRESENTING ILLNESS:  Shaun Lang 66 y.o. male returns for a follow up for pancytopenia. He was last seen on 10/01/2021 to establish care. In the interim, he has completed workup including serologic studies, abdominal ultrasound and bone marrow biopsy. He is unaccompanied for this visit.   Mr. Donson reports that his energy levels are unchanged since the last visit. He is still able to complete all his daily activities on his own. He denies any appetite changes or weight loss. He denies any GI symptoms including nausea, vomiting, diarrhea or constipation. He denies any easy bruising or signs of active bleeding. He denies fevers, chills, sweats, shortness of breath, chest pain or cough. He has no other complaints. He has no other complaints.   MEDICAL HISTORY:  Past Medical History:  Diagnosis Date   Bell's palsy April 2010   Chest pain with low risk for cardiac etiology    Low Risk Myoview Sept 2013   DJD (degenerative joint disease)    disabilty secondary to neck issues   Hyperlipidemia    Hypertension    Sleep apnea    does not use CPAP   Smoker     SURGICAL HISTORY: Past Surgical History:  Procedure Laterality Date   BUNIONECTOMY Left 07/15/2017   with big toe fracture repair   COLONOSCOPY  07/18/2012   polyps-Dr.Outlaw   HERNIA REPAIR  04/1998   [facility]   NECK SURGERY  07/21/02 and 01/2006   WRIST SURGERY  1992   right    SOCIAL HISTORY: Social History   Socioeconomic History   Marital status: Single    Spouse name: Not on file   Number of children: Not on file   Years of education: Not on file   Highest education level: Not on file  Occupational History    Comment: unemployed  Tobacco Use   Smoking status: Every Day     Packs/day: 0.50    Years: 33.00    Total pack years: 16.50    Types: Cigarettes   Smokeless tobacco: Never  Vaping Use   Vaping Use: Never used  Substance and Sexual Activity   Alcohol use: Not Currently    Alcohol/week: 2.0 standard drinks of alcohol    Types: 2 Cans of beer per week   Drug use: No   Sexual activity: Yes    Birth control/protection: None  Other Topics Concern   Not on file  Social History Narrative   Not on file   Social Determinants of Health   Financial Resource Strain: Low Risk  (10/07/2021)   Overall Financial Resource Strain (CARDIA)    Difficulty of Paying Living Expenses: Not hard at all  Food Insecurity: No Food Insecurity (10/07/2021)   Hunger Vital Sign    Worried About Running Out of Food in the Last Year: Never true    Ran Out of Food in the Last Year: Never true  Transportation Needs: No Transportation Needs (10/07/2021)   PRAPARE - Hydrologist (Medical): No    Lack of Transportation (Non-Medical): No  Physical Activity: Inactive (10/07/2021)   Exercise Vital Sign    Days of Exercise per Week: 0 days    Minutes of Exercise per Session: 0 min  Stress: No Stress Concern Present (10/07/2021)  [place] Questionnaire    Feeling of Stress : Not at all  Social Connections: Socially Isolated (10/07/2021)   Social Connection and Isolation Panel [NHANES]    Frequency of Communication with Friends and Family: Three times a week    Frequency of Social Gatherings with Friends and Family: Three times a week    Attends Religious Services: Never    Active Member of Clubs or Organizations: No    Attends Archivist Meetings: Never    Marital Status: Divorced  Human resources officer Violence: Not At Risk (10/07/2021)   Humiliation, Afraid, Rape, and Kick questionnaire    Fear of Current or Ex-Partner: No    Emotionally Abused: No    Physically Abused: No    Sexually Abused:  No    FAMILY HISTORY: Family History  Problem Relation Age of Onset   Diabetes Mother    Cancer Sister        breast   Diabetes Sister    Stroke Sister 34   Breast cancer Sister    Colon cancer Neg Hx    Esophageal cancer Neg Hx    Rectal cancer Neg Hx    Stomach cancer Neg Hx     ALLERGIES:  has No Known Allergies.  MEDICATIONS:  Current Outpatient Medications  Medication Sig Dispense Refill   cyclobenzaprine (FLEXERIL) 10 MG tablet TAKE 1 TABLET BY MOUTH TWICE DAILY AS NEEDED FOR MUSCLE SPASM 60 tablet 2   gabapentin (NEURONTIN) 300 MG capsule Take 1 capsule (300 mg total) by mouth 2 (two) times daily. 60 capsule 2   HYDROcodone-acetaminophen (NORCO) 10-325 MG tablet Take 1 tablet by mouth 2 (two) times daily as needed for moderate pain. Do Not Fill Before 11/12/2021 55 tablet 0   lisinopril (ZESTRIL) 5 MG tablet Take 1 tablet (5 mg total) by mouth daily. 90 tablet 3   Multiple Vitamin (MULTIVITAMIN WITH MINERALS) TABS tablet Take 1 tablet by mouth daily.     rosuvastatin (CRESTOR) 40 MG tablet Take 1 tablet (40 mg total) by mouth daily. 90 tablet 3   tadalafil (CIALIS) 10 MG tablet Take 1 tablet (10 mg total) by mouth daily as needed for erectile dysfunction. 20 tablet 0   triamterene-hydrochlorothiazide (MAXZIDE-25) 37.5-25 MG tablet Take 1 tablet by mouth daily. 90 tablet 1   No current facility-administered medications for this visit.    REVIEW OF SYSTEMS:   Constitutional: ( - ) fevers, ( - )  chills , ( - ) night sweats Eyes: ( - ) blurriness of vision, ( - ) double vision, ( - ) watery eyes Ears, nose, mouth, throat, and face: ( - ) mucositis, ( - ) sore throat Respiratory: ( - ) cough, ( - ) dyspnea, ( - ) wheezes Cardiovascular: ( - ) palpitation, ( - ) chest discomfort, ( - ) lower extremity swelling Gastrointestinal:  ( - ) nausea, ( - ) heartburn, ( - ) change in bowel habits Skin: ( - ) abnormal skin rashes Lymphatics: ( - ) new lymphadenopathy, ( - ) easy  bruising Neurological: ( - ) numbness, ( - ) tingling, ( - ) new weaknesses Behavioral/Psych: ( - ) mood change, ( - ) new changes  All other systems were reviewed with the patient and are negative.  PHYSICAL EXAMINATION: ECOG PERFORMANCE STATUS: 0 - Asymptomatic  Vitals:   11/28/21 1528  BP: (!) 158/91  Pulse: 70  Resp: 16  Temp: 98.1 F (36.7 C)  SpO2:  99%   Filed Weights   11/28/21 1528  Weight: 183 lb (83 kg)    GENERAL: well appearing male in NAD  SKIN: skin color, texture, turgor are normal, no rashes or significant lesions EYES: conjunctiva are pink and non-injected, sclera clear LUNGS: clear to auscultation and percussion with normal breathing effort HEART: regular rate & rhythm and no murmurs and no lower extremity edema Musculoskeletal: no cyanosis of digits and no clubbing  PSYCH: alert & oriented x 3, fluent speech NEURO: no focal motor/sensory deficits  LABORATORY DATA:  I have reviewed the data as listed    Latest Ref Rng & Units 11/28/2021    3:15 PM 11/18/2021    8:05 AM 10/01/2021    2:59 PM  CBC  WBC 4.0 - 10.5 K/uL 3.6  2.9  3.6   Hemoglobin 13.0 - 17.0 g/dL 11.8  12.7  12.4   Hematocrit 39.0 - 52.0 % 35.3  39.8  37.6   Platelets 150 - 400 K/uL 123  124  132        Latest Ref Rng & Units 11/28/2021    3:15 PM 10/01/2021    2:59 PM 07/17/2021    8:56 AM  CMP  Glucose 70 - 99 mg/dL 115  86  84   BUN 8 - 23 mg/dL 16  16  16    Creatinine 0.61 - 1.24 mg/dL 1.10  1.14  1.07   Sodium 135 - 145 mmol/L 137  141  141   Potassium 3.5 - 5.1 mmol/L 3.7  3.8  4.3   Chloride 98 - 111 mmol/L 108  108  106   CO2 22 - 32 mmol/L 26  27  27    Calcium 8.9 - 10.3 mg/dL 8.7  9.7  9.0   Total Protein 6.5 - 8.1 g/dL 6.7  7.7  6.8   Total Bilirubin 0.3 - 1.2 mg/dL 0.4  0.5  0.4   Alkaline Phos 38 - 126 U/L 47  52  55   AST 15 - 41 U/L 16  20  17    ALT 0 - 44 U/L 11  20  16      RADIOGRAPHIC STUDIES: I have personally reviewed the radiological images as listed and  agreed with the findings in the report. CT BONE MARROW BIOPSY & ASPIRATION  Result Date: 11/18/2021 INDICATION: Anemia.  Leukopenia. EXAM: CT GUIDED BONE MARROW ASPIRATION AND CORE BIOPSY MEDICATIONS: None. ANESTHESIA/SEDATION: Moderate (conscious) sedation was employed during this procedure. A total of Versed 2 mg and Fentanyl 100 mcg was administered intravenously. Moderate Sedation Time: 15 minutes. The patient's level of consciousness and vital signs were monitored continuously by radiology nursing throughout the procedure under my direct supervision. FLUOROSCOPY TIME:  CT dose in mGy was not provided. COMPLICATIONS: None immediate. Estimated blood loss: <5 mL PROCEDURE: RADIATION DOSE REDUCTION: This exam was performed according to the departmental dose-optimization program which includes automated exposure control, adjustment of the mA and/or kV according to patient size and/or use of iterative reconstruction technique. Informed written consent was obtained from the the patient and/or patient's representative after a thorough discussion of the procedural risks, benefits and alternatives. All questions were addressed. Maximal Sterile Barrier Technique was utilized including caps, mask, sterile gowns, sterile gloves, sterile drape, hand hygiene and skin antiseptic. A timeout was performed prior to the initiation of the procedure. The patient was positioned prone and non-contrast localization CT was performed of the pelvis to demonstrate the iliac marrow spaces. Maximal barrier sterile technique utilized including  caps, mask, sterile gowns, sterile gloves, large sterile drape, hand hygiene, and chlorhexidine prep. Under sterile conditions and local anesthesia, an 11 gauge coaxial bone biopsy needle was advanced into the RIGHT iliac marrow space. Needle position was confirmed with CT imaging. Initially, bone marrow aspiration was performed. Next, the 11 gauge outer cannula was utilized to obtain a 1 iliac bone  marrow core biopsy. Needle was removed. Hemostasis was obtained with compression. The patient tolerated the procedure well. Samples were prepared with the cytotechnologist. IMPRESSION: Successful CT-guided bone marrow aspiration and biopsy, as above. Michaelle Birks, MD Vascular and Interventional Radiology Specialists [facility] Radiology Electronically Signed   By: Michaelle Birks M.D.   On: 11/18/2021 17:00    ASSESSMENT & PLAN SON BARKAN is a 66 y.o. male who returns for a follow up for pancytopenia.    #Pancytopenia--etiology unknown: --Workup from 10/01/2021 ruled out nutritional deficiencies, hepatitis B and C and paraproteinemia. HIV serology negative from 07/17/2021.  --Abdominal US from 10/20/2021 was normal without any liver disease or splenomegaly  --Underwent bone marrow biopsy form 11/18/2021. Findings showed normocellular bone marrow with trilineage hematopoiesis.  --Labs today were reviewed that showed WBC 3.6, Hgb 11.8, Plt 123K. --Recommend to monitor for now as levels have been stable.  --RTC in 6 months with repeat labs.   No orders of the defined types were placed in this encounter.   All questions were answered. The patient knows to call the clinic with any problems, questions or concerns.  I have spent a total of 25 minutes minutes of face-to-face and non-face-to-face time, preparing to see the patient,  performing a medically appropriate examination, counseling and educating the patient,  documenting clinical information in the electronic health record,and care coordination.   Dede Query, PA-C Department of Hematology/Oncology [place] at [facility] Phone: [phone]

## 2021-12-10 ENCOUNTER — Encounter: Payer: Self-pay | Admitting: Registered Nurse

## 2021-12-10 ENCOUNTER — Encounter: Payer: Medicare Other | Attending: Physical Medicine & Rehabilitation | Admitting: Registered Nurse

## 2021-12-10 VITALS — BP 135/83 | HR 61 | Ht 68.0 in | Wt 182.4 lb

## 2021-12-10 DIAGNOSIS — M25511 Pain in right shoulder: Secondary | ICD-10-CM | POA: Insufficient documentation

## 2021-12-10 DIAGNOSIS — G8929 Other chronic pain: Secondary | ICD-10-CM | POA: Diagnosis not present

## 2021-12-10 DIAGNOSIS — Z5181 Encounter for therapeutic drug level monitoring: Secondary | ICD-10-CM | POA: Diagnosis not present

## 2021-12-10 DIAGNOSIS — Z79891 Long term (current) use of opiate analgesic: Secondary | ICD-10-CM | POA: Insufficient documentation

## 2021-12-10 DIAGNOSIS — M7918 Myalgia, other site: Secondary | ICD-10-CM | POA: Diagnosis not present

## 2021-12-10 DIAGNOSIS — G894 Chronic pain syndrome: Secondary | ICD-10-CM | POA: Insufficient documentation

## 2021-12-10 DIAGNOSIS — M542 Cervicalgia: Secondary | ICD-10-CM | POA: Insufficient documentation

## 2021-12-10 DIAGNOSIS — M47817 Spondylosis without myelopathy or radiculopathy, lumbosacral region: Secondary | ICD-10-CM | POA: Diagnosis not present

## 2021-12-10 MED ORDER — HYDROCODONE-ACETAMINOPHEN 10-325 MG PO TABS: 1.0000 | ORAL_TABLET | Freq: Two times a day (BID) | ORAL | 0 refills | Status: DC | PRN

## 2021-12-10 NOTE — Progress Notes (Signed)
Subjective:    Patient ID: Shaun Lang, male    DOB: Dec 10, 1955, 66 y.o.   MRN: [id number]  HPI: Shaun Lang is a 66 y.o. male who returns for follow up appointment for chronic pain and medication refill. He states his  pain is located in his neck and lower back mainly right side. He rates his pain 7. His current exercise regime is walking and performing stretching exercises.  Mr. Schlabach Morphine equivalent is 20.37 MME.   Last UDS was Performed on 07/23/2021, it was consistent.    Pain Inventory Average Pain 7 Pain Right Now 7 My pain is burning, dull, stabbing, and aching  In the last 24 hours, has pain interfered with the following? General activity 7 Relation with others 9 Enjoyment of life 7 What TIME of day is your pain at its worst? morning  and evening Sleep (in general) Poor  Pain is worse with: bending, standing, and some activites Pain improves with: medication Relief from Meds: 7  Family History  Problem Relation Age of Onset   Diabetes Mother    Cancer Sister        breast   Diabetes Sister    Stroke Sister 28   Breast cancer Sister    Colon cancer Neg Hx    Esophageal cancer Neg Hx    Rectal cancer Neg Hx    Stomach cancer Neg Hx    Social History   Socioeconomic History   Marital status: Single    Spouse name: Not on file   Number of children: Not on file   Years of education: Not on file   Highest education level: Not on file  Occupational History    Comment: unemployed  Tobacco Use   Smoking status: Every Day    Packs/day: 0.50    Years: 33.00    Total pack years: 16.50    Types: Cigarettes   Smokeless tobacco: Never  Vaping Use   Vaping Use: Never used  Substance and Sexual Activity   Alcohol use: Not Currently    Alcohol/week: 2.0 standard drinks of alcohol    Types: 2 Cans of beer per week   Drug use: No   Sexual activity: Yes    Birth control/protection: None  Other Topics Concern   Not on file  Social History Narrative    Not on file   Social Determinants of Health   Financial Resource Strain: Low Risk  (10/07/2021)   Overall Financial Resource Strain (CARDIA)    Difficulty of Paying Living Expenses: Not hard at all  Food Insecurity: No Food Insecurity (10/07/2021)   Hunger Vital Sign    Worried About Running Out of Food in the Last Year: Never true    Ran Out of Food in the Last Year: Never true  Transportation Needs: No Transportation Needs (10/07/2021)   PRAPARE - Administrator, Civil Service (Medical): No    Lack of Transportation (Non-Medical): No  Physical Activity: Inactive (10/07/2021)   Exercise Vital Sign    Days of Exercise per Week: 0 days    Minutes of Exercise per Session: 0 min  Stress: No Stress Concern Present (10/07/2021)   [employer] of Occupational Health - Occupational Stress Questionnaire    Feeling of Stress : Not at all  Social Connections: Socially Isolated (10/07/2021)   Social Connection and Isolation Panel [NHANES]    Frequency of Communication with Friends and Family: Three times a week    Frequency of Social  Gatherings with Friends and Family: Three times a week    Attends Religious Services: Never    Active Member of Clubs or Organizations: No    Attends Archivist Meetings: Never    Marital Status: Divorced   Past Surgical History:  Procedure Laterality Date   BUNIONECTOMY Left 07/15/2017   with big toe fracture repair   COLONOSCOPY  07/18/2012   polyps-Dr.Outlaw   [facility] REPAIR  04/1998   [facility]   NECK SURGERY  07/21/02 and 01/2006   WRIST SURGERY  1992   right   Past Surgical History:  Procedure Laterality Date   BUNIONECTOMY Left 07/15/2017   with big toe fracture repair   COLONOSCOPY  07/18/2012   polyps-Dr.Outlaw   HERNIA REPAIR  04/1998   [facility]   NECK SURGERY  07/21/02 and 01/2006   WRIST SURGERY  1992   right   Past Medical History:  Diagnosis Date   Bell's palsy April 2010   Chest pain with low risk for cardiac etiology     Low Risk Myoview Sept 2013   DJD (degenerative joint disease)    disabilty secondary to neck issues   Hyperlipidemia    Hypertension    Sleep apnea    does not use CPAP   Smoker    BP 135/83   Pulse 61   Ht 5\' 8"  (1.727 m)   Wt 182 lb 6.4 oz (82.7 kg)   SpO2 98%   BMI 27.73 kg/m   Opioid Risk Score:   Fall Risk Score:  `1  Depression screen PHQ 2/9     12/10/2021    1:00 PM 10/07/2021   11:28 AM 10/07/2021   11:26 AM 09/22/2021    1:22 PM 07/23/2021   10:03 AM 07/14/2021    1:41 PM 05/28/2021    9:47 AM  Depression screen PHQ 2/9  Decreased Interest 0 0 0 0 0 0 0  Down, Depressed, Hopeless 0 0 0 0 0 0 0  PHQ - 2 Score 0 0 0 0 0 0 0  Altered sleeping      1   Tired, decreased energy      3   Change in appetite      0   Feeling bad or failure about yourself       0   Trouble concentrating      0   Moving slowly or fidgety/restless      1   Suicidal thoughts      0   PHQ-9 Score      5   Difficult doing work/chores      Not difficult at all       Review of Systems  Musculoskeletal:  Positive for back pain and neck pain.  All other systems reviewed and are negative.     Objective:   Physical Exam Vitals and nursing note reviewed.  Constitutional:      Appearance: Normal appearance.  Cardiovascular:     Rate and Rhythm: Normal rate and regular rhythm.     Pulses: Normal pulses.     Heart sounds: Normal heart sounds.  Pulmonary:     Effort: Pulmonary effort is normal.     Breath sounds: Normal breath sounds.  Musculoskeletal:     Cervical back: Normal range of motion and neck supple.     Comments: Normal Muscle Bulk and Muscle Testing Reveals:  Upper Extremities: Full ROM and Muscle Strength 5/5 Lower Extremities : Full ROM and  Muscle Strength  5/5 Arises from Table with Ease Narrow Based Gait     Skin:    General: Skin is warm and dry.  Neurological:     Mental Status: He is alert and oriented to person, place, and time.  Psychiatric:        Mood and  Affect: Mood normal.        Behavior: Behavior normal.         Assessment & Plan:  1. Chronic left inguinal pain: S/P inguinal hernia (indirect) and mesh repair. Continue to Monitor. 09/20//2023. Refilled: Hydrocodone 10/325 mg one tablet daily as needed, may take and extra tablet when pain is moderate to sever 15 days out of the month. # 55.Second script sent for the following month. We will continue the opioid monitoring program, this consists of regular clinic visits, examinations, urine drug screen, pill counts as well as use of [place] Controlled Substance Reporting system. A 12 month History has been reviewed on the [place] Controlled Substance Reporting System on 12/10/2021.  2. Lumbosacral Spondylosis: Continue with Exercise regime. Continue to Monitor. 12/10/2021 3. Muscle Spasm: Continue current medication regimen with Flexeril. Continue to monitor.12/10/2021  4.Chronic Right Shoulder Pain :No complaints today.  Chronic Pain Syndrome:  Continue HEP as tolerated.  Continue to Monitor. 12/10/2021.  5. Cervicalgia/ Cervical Radiculitis: Continue HEP as Tolerated. Alternate heat and Ice Therapy as tolerated. Continue current medication regimen. Continue to Monitor. 12/10/2021 6. Left Greater Trochanteric Tenderness: No complaints today. Alternate with Ice and Heat Therapy. Continue to Monitor. 12/10/2021 7. Bilateral  Shoulder Pain:  No complaints today. Continue with HEP as tolerated and alternate with Ice  and Heat  Therapy. 12/10/2021   F/U in 2 months

## 2021-12-17 ENCOUNTER — Telehealth: Payer: Self-pay | Admitting: Registered Nurse

## 2021-12-17 MED ORDER — HYDROCODONE-ACETAMINOPHEN 10-325 MG PO TABS
1.0000 | ORAL_TABLET | Freq: Two times a day (BID) | ORAL | 0 refills | Status: DC | PRN
Start: 2021-12-17 — End: 2021-12-18

## 2021-12-17 NOTE — Telephone Encounter (Signed)
PMP was Reviewed.  Hydrocodone e- scribed today to [facility] out of stock . Call placed to Mr. Mcgregor he is aware of the above and verbalizes understanding.

## 2021-12-18 ENCOUNTER — Telehealth: Payer: Self-pay | Admitting: *Deleted

## 2021-12-18 MED ORDER — HYDROCODONE-ACETAMINOPHEN 10-325 MG PO TABS: 1.0000 | ORAL_TABLET | Freq: Two times a day (BID) | ORAL | 0 refills | Status: DC | PRN

## 2021-12-18 NOTE — Telephone Encounter (Signed)
PMP was Reviewed.  Hydrocodone e-scribed for remainder of Hydrocodone prescription.

## 2021-12-18 NOTE — Telephone Encounter (Signed)
Shaun Lang called and his pharmacy only has 15 of his hydrocodone  but are expecting a shipment in a few days. He wants to get the #15 but will need an Rx sent over for the remainder of the Rx that can be filled when the shipment comes in.

## 2021-12-23 ENCOUNTER — Ambulatory Visit (HOSPITAL_COMMUNITY)
Admission: EM | Admit: 2021-12-23 | Discharge: 2021-12-23 | Disposition: A | Discharge status: Home / Self Care | Payer: Medicare Other

## 2022-01-15 ENCOUNTER — Ambulatory Visit (INDEPENDENT_AMBULATORY_CARE_PROVIDER_SITE_OTHER): Payer: Medicare Other | Admitting: Nurse Practitioner

## 2022-01-15 ENCOUNTER — Encounter: Payer: Self-pay | Admitting: Nurse Practitioner

## 2022-01-15 VITALS — BP 142/84 | HR 75 | Temp 98.4°F | Ht 68.0 in | Wt 178.0 lb

## 2022-01-15 DIAGNOSIS — E782 Mixed hyperlipidemia: Secondary | ICD-10-CM

## 2022-01-15 DIAGNOSIS — I10 Essential (primary) hypertension: Secondary | ICD-10-CM | POA: Diagnosis not present

## 2022-01-15 DIAGNOSIS — R739 Hyperglycemia, unspecified: Secondary | ICD-10-CM | POA: Diagnosis not present

## 2022-01-15 LAB — POCT GLYCOSYLATED HEMOGLOBIN (HGB A1C): Hemoglobin A1C: 5.7 % — AB (ref 4.0–5.6)

## 2022-01-15 MED ORDER — TRIAMTERENE-HCTZ 37.5-25 MG PO TABS: 1.0000 | ORAL_TABLET | Freq: Every day | ORAL | 1 refills | Status: DC

## 2022-01-15 MED ORDER — LISINOPRIL 5 MG PO TABS: 5.0000 mg | ORAL_TABLET | Freq: Every day | ORAL | 3 refills | Status: DC

## 2022-01-15 MED ORDER — ROSUVASTATIN CALCIUM 40 MG PO TABS: 40.0000 mg | ORAL_TABLET | Freq: Every day | ORAL | 3 refills | Status: DC

## 2022-01-15 NOTE — Patient Instructions (Addendum)
hgbA1c at 5.7%: normal Take BP medications daily Monitor BP in AM daily Send BP reading via mychart on Thursday. It is important to maintain good BP control to safely continue use of cialis

## 2022-01-15 NOTE — Progress Notes (Signed)
Established Patient Visit  Patient: Shaun Lang   DOB: 05-18-1955   67 y.o. Male  MRN: [id number] Visit Date: 01/18/2022  Subjective:    Chief Complaint  Patient presents with   Office Visit    HTN/Hyperlipidemia Doesn't check BP       HPI Hypertension Report intermittent medication noncompliance. BP Readings from Last 3 Encounters:  01/15/22 (!) 142/84  12/10/21 135/83  11/28/21 (!) 158/91   advised about about the importance of medication compliance and possible adverse effects of uncontrolled HTN  BP Readings from Last 3 Encounters:  01/15/22 (!) 142/84  12/10/21 135/83  11/28/21 (!) 158/91    Reviewed medical, surgical, and social history today  Medications: Outpatient Medications Prior to Visit  Medication Sig   cyclobenzaprine (FLEXERIL) 10 MG tablet TAKE 1 TABLET BY MOUTH TWICE DAILY AS NEEDED FOR MUSCLE SPASM   gabapentin (NEURONTIN) 300 MG capsule Take 1 capsule (300 mg total) by mouth 2 (two) times daily.   HYDROcodone-acetaminophen (NORCO) 10-325 MG tablet Take 1 tablet by mouth 2 (two) times daily as needed for moderate pain.   Multiple Vitamin (MULTIVITAMIN WITH MINERALS) TABS tablet Take 1 tablet by mouth daily.   [DISCONTINUED] lisinopril (ZESTRIL) 5 MG tablet Take 1 tablet (5 mg total) by mouth daily.   [DISCONTINUED] rosuvastatin (CRESTOR) 40 MG tablet Take 1 tablet (40 mg total) by mouth daily.   [DISCONTINUED] triamterene-hydrochlorothiazide (MAXZIDE-25) 37.5-25 MG tablet Take 1 tablet by mouth daily.   [DISCONTINUED] tadalafil (CIALIS) 10 MG tablet Take 1 tablet (10 mg total) by mouth daily as needed for erectile dysfunction. (Patient not taking: Reported on 01/15/2022)   No facility-administered medications prior to visit.   Reviewed past medical and social history.   ROS per HPI above  Last CBC Lab Results  Component Value Date   WBC 3.6 (L) 11/28/2021   HGB 11.8 (L) 11/28/2021   HCT 35.3 (L) 11/28/2021   MCV 87.2  11/28/2021   MCH 29.1 11/28/2021   RDW 13.8 11/28/2021   PLT 123 (L) 17/40/8144   Last metabolic panel Lab Results  Component Value Date   GLUCOSE 115 (H) 11/28/2021   NA 137 11/28/2021   K 3.7 11/28/2021   CL 108 11/28/2021   CO2 26 11/28/2021   BUN 16 11/28/2021   CREATININE 1.10 11/28/2021   GFRNONAA >60 11/28/2021   CALCIUM 8.7 (L) 11/28/2021   PROT 6.7 11/28/2021   ALBUMIN 4.1 11/28/2021   LABGLOB 3.1 10/01/2021   BILITOT 0.4 11/28/2021   ALKPHOS 47 11/28/2021   AST 16 11/28/2021   ALT 11 11/28/2021   ANIONGAP 3 (L) 11/28/2021   Last lipids Lab Results  Component Value Date   CHOL 119 08/15/2021   HDL 52.40 08/15/2021   LDLCALC 56 08/15/2021   TRIG 56.0 08/15/2021   CHOLHDL 2 08/15/2021   Last hemoglobin A1c Lab Results  Component Value Date   HGBA1C 5.7 (A) 01/15/2022        Objective:  BP (!) 142/84   Pulse 75   Temp 98.4 F (36.9 C) (Temporal)   Ht 5\' 8"  (1.727 m)   Wt 178 lb (80.7 kg)   SpO2 96%   BMI 27.06 kg/m      Physical Exam Cardiovascular:     Rate and Rhythm: Normal rate.     Pulses: Normal pulses.  Pulmonary:     Effort: Pulmonary effort is normal.  Musculoskeletal:  Right lower leg: No edema.     Left lower leg: No edema.  Neurological:     Mental Status: He is alert and oriented to person, place, and time.     Results for orders placed or performed in visit on 01/15/22  POCT glycosylated hemoglobin (Hb A1C)  Result Value Ref Range   Hemoglobin A1C 5.7 (A) 4.0 - 5.6 %      Assessment & Plan:    Problem List Items Addressed This Visit       Cardiovascular and Mediastinum   Hypertension - Primary    Report intermittent medication noncompliance. BP Readings from Last 3 Encounters:  01/15/22 (!) 142/84  12/10/21 135/83  11/28/21 (!) 158/91   advised about about the importance of medication compliance and possible adverse effects of uncontrolled HTN       Relevant Medications   triamterene-hydrochlorothiazide  (MAXZIDE-25) 37.5-25 MG tablet   rosuvastatin (CRESTOR) 40 MG tablet   lisinopril (ZESTRIL) 5 MG tablet     Other   Hyperlipidemia   Relevant Medications   triamterene-hydrochlorothiazide (MAXZIDE-25) 37.5-25 MG tablet   rosuvastatin (CRESTOR) 40 MG tablet   lisinopril (ZESTRIL) 5 MG tablet   Other Visit Diagnoses     Hyperglycemia       Relevant Orders   POCT glycosylated hemoglobin (Hb A1C) (Completed)   Essential hypertension       Relevant Medications   triamterene-hydrochlorothiazide (MAXZIDE-25) 37.5-25 MG tablet   rosuvastatin (CRESTOR) 40 MG tablet   lisinopril (ZESTRIL) 5 MG tablet      Return in about 6 months (around 07/17/2022) for HTN, hyperlipidemia (fasting).     Alysia Penna, NP

## 2022-01-18 DIAGNOSIS — R739 Hyperglycemia, unspecified: Secondary | ICD-10-CM | POA: Insufficient documentation

## 2022-01-18 NOTE — Assessment & Plan Note (Addendum)
Report intermittent medication noncompliance. BP Readings from Last 3 Encounters:  01/15/22 (!) 142/84  12/10/21 135/83  11/28/21 (!) 158/91   advised about about the importance of medication compliance and possible adverse effects of uncontrolled HTN maintain med dose.

## 2022-01-23 ENCOUNTER — Telehealth: Payer: Self-pay

## 2022-01-23 NOTE — Telephone Encounter (Signed)
Patient due for next colonoscopy August 2029

## 2022-02-05 ENCOUNTER — Encounter: Payer: Medicare Other | Attending: Physical Medicine & Rehabilitation | Admitting: Registered Nurse

## 2022-02-05 ENCOUNTER — Encounter: Payer: Self-pay | Admitting: Registered Nurse

## 2022-02-05 VITALS — BP 129/78 | HR 78 | Ht 68.0 in | Wt 177.8 lb

## 2022-02-05 DIAGNOSIS — Z79891 Long term (current) use of opiate analgesic: Secondary | ICD-10-CM | POA: Diagnosis not present

## 2022-02-05 DIAGNOSIS — Z5181 Encounter for therapeutic drug level monitoring: Secondary | ICD-10-CM | POA: Diagnosis not present

## 2022-02-05 DIAGNOSIS — M47817 Spondylosis without myelopathy or radiculopathy, lumbosacral region: Secondary | ICD-10-CM | POA: Diagnosis not present

## 2022-02-05 DIAGNOSIS — G894 Chronic pain syndrome: Secondary | ICD-10-CM | POA: Insufficient documentation

## 2022-02-05 DIAGNOSIS — M25512 Pain in left shoulder: Secondary | ICD-10-CM | POA: Diagnosis not present

## 2022-02-05 DIAGNOSIS — G8929 Other chronic pain: Secondary | ICD-10-CM | POA: Insufficient documentation

## 2022-02-05 MED ORDER — HYDROCODONE-ACETAMINOPHEN 10-325 MG PO TABS: 1.0000 | ORAL_TABLET | Freq: Two times a day (BID) | ORAL | 0 refills | Status: DC | PRN

## 2022-02-05 NOTE — Progress Notes (Signed)
Subjective:    Patient ID: Shaun Lang, male    DOB: 04/29/1955, 66 y.o.   MRN: [id number]  HPI: Shaun Lang is a 66 y.o. male who returns for follow up appointment for chronic pain and medication refill. He states his pain is located in his left shoulder and lower back pain. He rates his pain 7. His current exercise regime is walking and performing stretching exercises.  Shaun Lang Morphine equivalent is 25.00 MME.   UDS ordered today.    Pain Inventory Average Pain 8 Pain Right Now 7 My pain is sharp, tingling, and aching  In the last 24 hours, has pain interfered with the following? General activity 8 Relation with others 9 Enjoyment of life 9 What TIME of day is your pain at its worst? daytime, evening, and night Sleep (in general) Poor  Pain is worse with: bending, standing, and some activites Pain improves with: medication Relief from Meds: 6  Family History  Problem Relation Age of Onset   Diabetes Mother    Cancer Sister        breast   Diabetes Sister    Stroke Sister 37   Breast cancer Sister    Colon cancer Neg Hx    Esophageal cancer Neg Hx    Rectal cancer Neg Hx    Stomach cancer Neg Hx    Social History   Socioeconomic History   Marital status: Single    Spouse name: Not on file   Number of children: Not on file   Years of education: Not on file   Highest education level: Not on file  Occupational History    Comment: unemployed  Tobacco Use   Smoking status: Every Day    Packs/day: 0.50    Years: 33.00    Total pack years: 16.50    Types: Cigarettes   Smokeless tobacco: Never  Vaping Use   Vaping Use: Never used  Substance and Sexual Activity   Alcohol use: Not Currently    Alcohol/week: 2.0 standard drinks of alcohol    Types: 2 Cans of beer per week   Drug use: No   Sexual activity: Yes    Birth control/protection: None  Other Topics Concern   Not on file  Social History Narrative   Not on file   Social Determinants of Health    Financial Resource Strain: Low Risk  (10/07/2021)   Overall Financial Resource Strain (CARDIA)    Difficulty of Paying Living Expenses: Not hard at all  Food Insecurity: No Food Insecurity (10/07/2021)   Hunger Vital Sign    Worried About Running Out of Food in the Last Year: Never true    Ran Out of Food in the Last Year: Never true  Transportation Needs: No Transportation Needs (10/07/2021)   PRAPARE - Administrator, Civil Service (Medical): No    Lack of Transportation (Non-Medical): No  Physical Activity: Inactive (10/07/2021)   Exercise Vital Sign    Days of Exercise per Week: 0 days    Minutes of Exercise per Session: 0 min  Stress: No Stress Concern Present (10/07/2021)   [employer] of Occupational Health - Occupational Stress Questionnaire    Feeling of Stress : Not at all  Social Connections: Socially Isolated (10/07/2021)   Social Connection and Isolation Panel [NHANES]    Frequency of Communication with Friends and Family: Three times a week    Frequency of Social Gatherings with Friends and Family: Three times a week  Attends Religious Services: Never    Active Member of Clubs or Organizations: No    Attends Banker Meetings: Never    Marital Status: Divorced   Past Surgical History:  Procedure Laterality Date   BUNIONECTOMY Left 07/15/2017   with big toe fracture repair   COLONOSCOPY  07/18/2012   polyps-Dr.Outlaw   [facility] REPAIR  04/1998   [facility]   NECK SURGERY  07/21/02 and 01/2006   WRIST SURGERY  1992   right   Past Surgical History:  Procedure Laterality Date   BUNIONECTOMY Left 07/15/2017   with big toe fracture repair   COLONOSCOPY  07/18/2012   polyps-Dr.Outlaw   HERNIA REPAIR  04/1998   [facility]   NECK SURGERY  07/21/02 and 01/2006   WRIST SURGERY  1992   right   Past Medical History:  Diagnosis Date   Bell's palsy April 2010   Chest pain with low risk for cardiac etiology    Low Risk Myoview Sept 2013   DJD  (degenerative joint disease)    disabilty secondary to neck issues   Hyperlipidemia    Hypertension    Sleep apnea    does not use CPAP   Smoker    BP 129/78   Pulse 78   Ht 5\' 8"  (1.727 m)   Wt 177 lb 12.8 oz (80.6 kg)   SpO2 98%   BMI 27.03 kg/m   Opioid Risk Score:   Fall Risk Score:  `1  Depression screen PHQ 2/9     02/05/2022   11:12 AM 12/10/2021    1:00 PM 10/07/2021   11:28 AM 10/07/2021   11:26 AM 09/22/2021    1:22 PM 07/23/2021   10:03 AM 07/14/2021    1:41 PM  Depression screen PHQ 2/9  Decreased Interest 0 0 0 0 0 0 0  Down, Depressed, Hopeless 0 0 0 0 0 0 0  PHQ - 2 Score 0 0 0 0 0 0 0  Altered sleeping       1  Tired, decreased energy       3  Change in appetite       0  Feeling bad or failure about yourself        0  Trouble concentrating       0  Moving slowly or fidgety/restless       1  Suicidal thoughts       0  PHQ-9 Score       5  Difficult doing work/chores       Not difficult at all      Review of Systems  Musculoskeletal:  Positive for back pain and neck pain.  All other systems reviewed and are negative.     Objective:   Physical Exam Vitals and nursing note reviewed.  Constitutional:      Appearance: Normal appearance.  Cardiovascular:     Rate and Rhythm: Normal rate and regular rhythm.     Pulses: Normal pulses.     Heart sounds: Normal heart sounds.  Pulmonary:     Effort: Pulmonary effort is normal.     Breath sounds: Normal breath sounds.  Musculoskeletal:     Cervical back: Normal range of motion and neck supple.     Comments: Normal Muscle Bulk and Muscle Testing Reveals:  Upper Extremities: Full ROM and Muscle Strength 5/5  Lumbar Paraspinal Tenderness: L-4-L-5 Lower Extremities: Full ROM and Muscle Strength 5/5 Arises from Chair with ease Narrow Based  Gait  Skin:    General: Skin is warm and dry.  Neurological:     Mental Status: He is alert and oriented to person, place, and time.  Psychiatric:        Mood  and Affect: Mood normal.        Behavior: Behavior normal.         Assessment & Plan:  1. Chronic left inguinal pain: S/P inguinal hernia (indirect) and mesh repair. Continue to Monitor. 11/16//2023. Refilled: Hydrocodone 10/325 mg one tablet  twice a day  as needed #40.Second script sent for the following month. We will continue the opioid monitoring program, this consists of regular clinic visits, examinations, urine drug screen, pill counts as well as use of [place] Controlled Substance Reporting system. A 12 month History has been reviewed on the [place] Controlled Substance Reporting System on 02/05/2022.  2. Lumbosacral Spondylosis: Continue with Exercise regime. Continue to Monitor. 02/05/2022 3. Muscle Spasm: Continue current medication regimen with Flexeril. Continue to monitor.02/05/2022  4.Chronic Right Shoulder Pain :No complaints today.  Chronic Pain Syndrome:  Continue HEP as tolerated.  Continue to Monitor. 02/05/2022.  5. Cervicalgia/ Cervical Radiculitis: Continue HEP as Tolerated. Alternate heat and Ice Therapy as tolerated. Continue current medication regimen. Continue to Monitor. 02/05/2022 6. Left Greater Trochanteric Tenderness: No complaints today. Alternate with Ice and Heat Therapy. Continue to Monitor. 02/05/2022 7. Left Shoulder Pain: Continue with HEP as tolerated and alternate with Ice  and Heat  Therapy. 02/05/2022   F/U in 2 months

## 2022-02-10 LAB — TOXASSURE SELECT,+ANTIDEPR,UR

## 2022-02-11 ENCOUNTER — Telehealth: Payer: Self-pay | Admitting: *Deleted

## 2022-02-11 NOTE — Telephone Encounter (Signed)
Urine drug screen for this encounter is consistent for prescribed medication 

## 2022-02-19 ENCOUNTER — Telehealth: Payer: Self-pay | Admitting: Registered Nurse

## 2022-02-19 MED ORDER — HYDROCODONE-ACETAMINOPHEN 10-325 MG PO TABS: 1.0000 | ORAL_TABLET | Freq: Two times a day (BID) | ORAL | 0 refills | Status: DC | PRN

## 2022-02-19 NOTE — Telephone Encounter (Signed)
PMP was Reviewed Hydrocodone prescription sent to pharmacy. [name] is aware via My- Chart

## 2022-02-19 NOTE — Telephone Encounter (Signed)
He has a prescription on file at pharmacy.

## 2022-03-09 ENCOUNTER — Telehealth: Payer: Self-pay

## 2022-03-09 NOTE — Patient Outreach (Signed)
  Care Coordination   Initial Visit Note   03/09/2022 Name: Shaun Lang MRN: [id number] DOB: March 01, 1956  Shaun Lang is a 66 y.o. year old male who sees Shaun Lang, Shaun Gains, NP for primary care. I spoke with  Shaun Lang by phone today.  What matters to the patients health and wellness today?  Tiredness in the mornings.  Patient does not eat in the morning.  Encouraged breakfast in the am    Goals Addressed             This Visit's Progress    COMPLETED: Care Coordination Activities-No follow up required       Care Coordination Interventions: Advised patient to schedule annual exam and flu vaccine. Discussed [facility] services and support.  Patient decline          SDOH assessments and interventions completed:  Yes  SDOH Interventions Today    Flowsheet Row Most Recent Value  SDOH Interventions   Housing Interventions Intervention Not Indicated  Transportation Interventions Intervention Not Indicated        Care Coordination Interventions:  Yes, provided   Follow up plan: No further intervention required.   Encounter Outcome:  Pt. Visit Completed   Bary Leriche, RN, MSN [facility] Care Management Care Management Coordinator Direct Line [phone]

## 2022-03-09 NOTE — Patient Instructions (Signed)
Visit Information  Thank you for taking time to visit with me today. Please don't hesitate to contact me if I can be of assistance to you.   Following are the goals we discussed today:   Goals Addressed             This Visit's Progress    COMPLETED: Care Coordination Activities-No follow up required       Care Coordination Interventions: Advised patient to schedule annual exam and flu vaccine.  Discussed [facility] services and support.  Patient decline.             If you are experiencing a Mental Health or Behavioral Health Crisis or need someone to talk to, please call the Suicide and Crisis Lifeline: 988   Patient verbalizes understanding of instructions and care plan provided today and agrees to view in MyChart. Active MyChart status and patient understanding of how to access instructions and care plan via MyChart confirmed with patient.     No further follow up required: decline  Staysha Truby J Demitri Kucinski, RN, MSN [facility] Care Management Care Management Coordinator Direct Line [phone]     

## 2022-03-25 ENCOUNTER — Telehealth: Payer: Self-pay | Admitting: Registered Nurse

## 2022-03-25 MED ORDER — HYDROCODONE-ACETAMINOPHEN 10-325 MG PO TABS: 1.0000 | ORAL_TABLET | Freq: Two times a day (BID) | ORAL | 0 refills | Status: DC | PRN

## 2022-03-25 NOTE — Telephone Encounter (Signed)
PMP reviewed.  Hydrocodone e-scribed today. [name] is aware via My-Chart message

## 2022-03-26 ENCOUNTER — Telehealth: Payer: Self-pay

## 2022-03-26 NOTE — Telephone Encounter (Signed)
Shaun Lang would like to know why he only received  40 Hydrocodone  10 MG Tablets? He usually gets 55.   Patient has been advised to let us know 7 days before he runs out this Rx. So the additional can be sent.   Patient understood.

## 2022-03-27 IMAGING — CR DG HIP (WITH OR WITHOUT PELVIS) 2-3V*L*
2 series · 2 of 2 positions shown · non-contrast
Comparison: 02/02/2019

CLINICAL DATA: Left hip pain

EXAM:
DG HIP (WITH OR WITHOUT PELVIS) 2-3V LEFT

[w hip ap left]
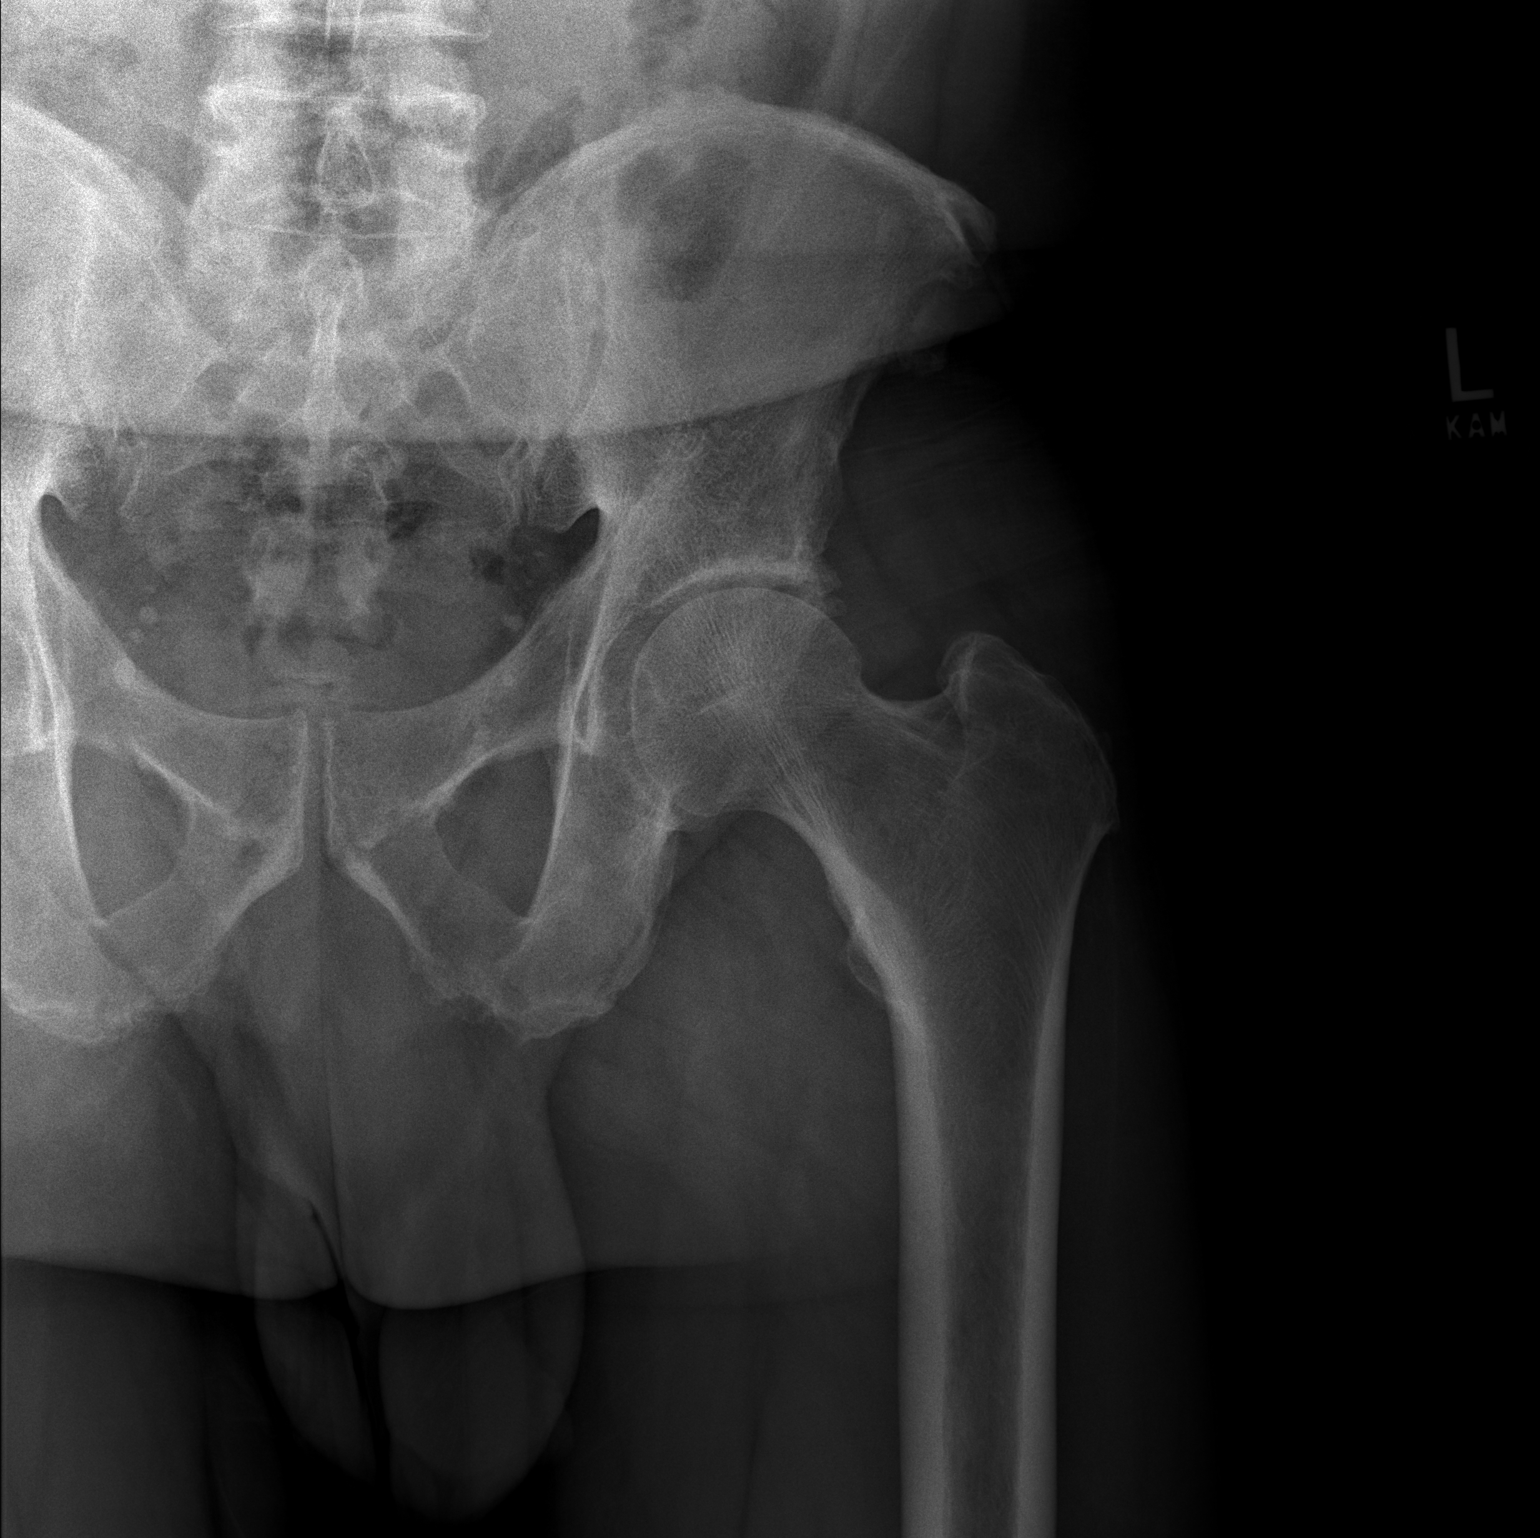

[w hip lat left]
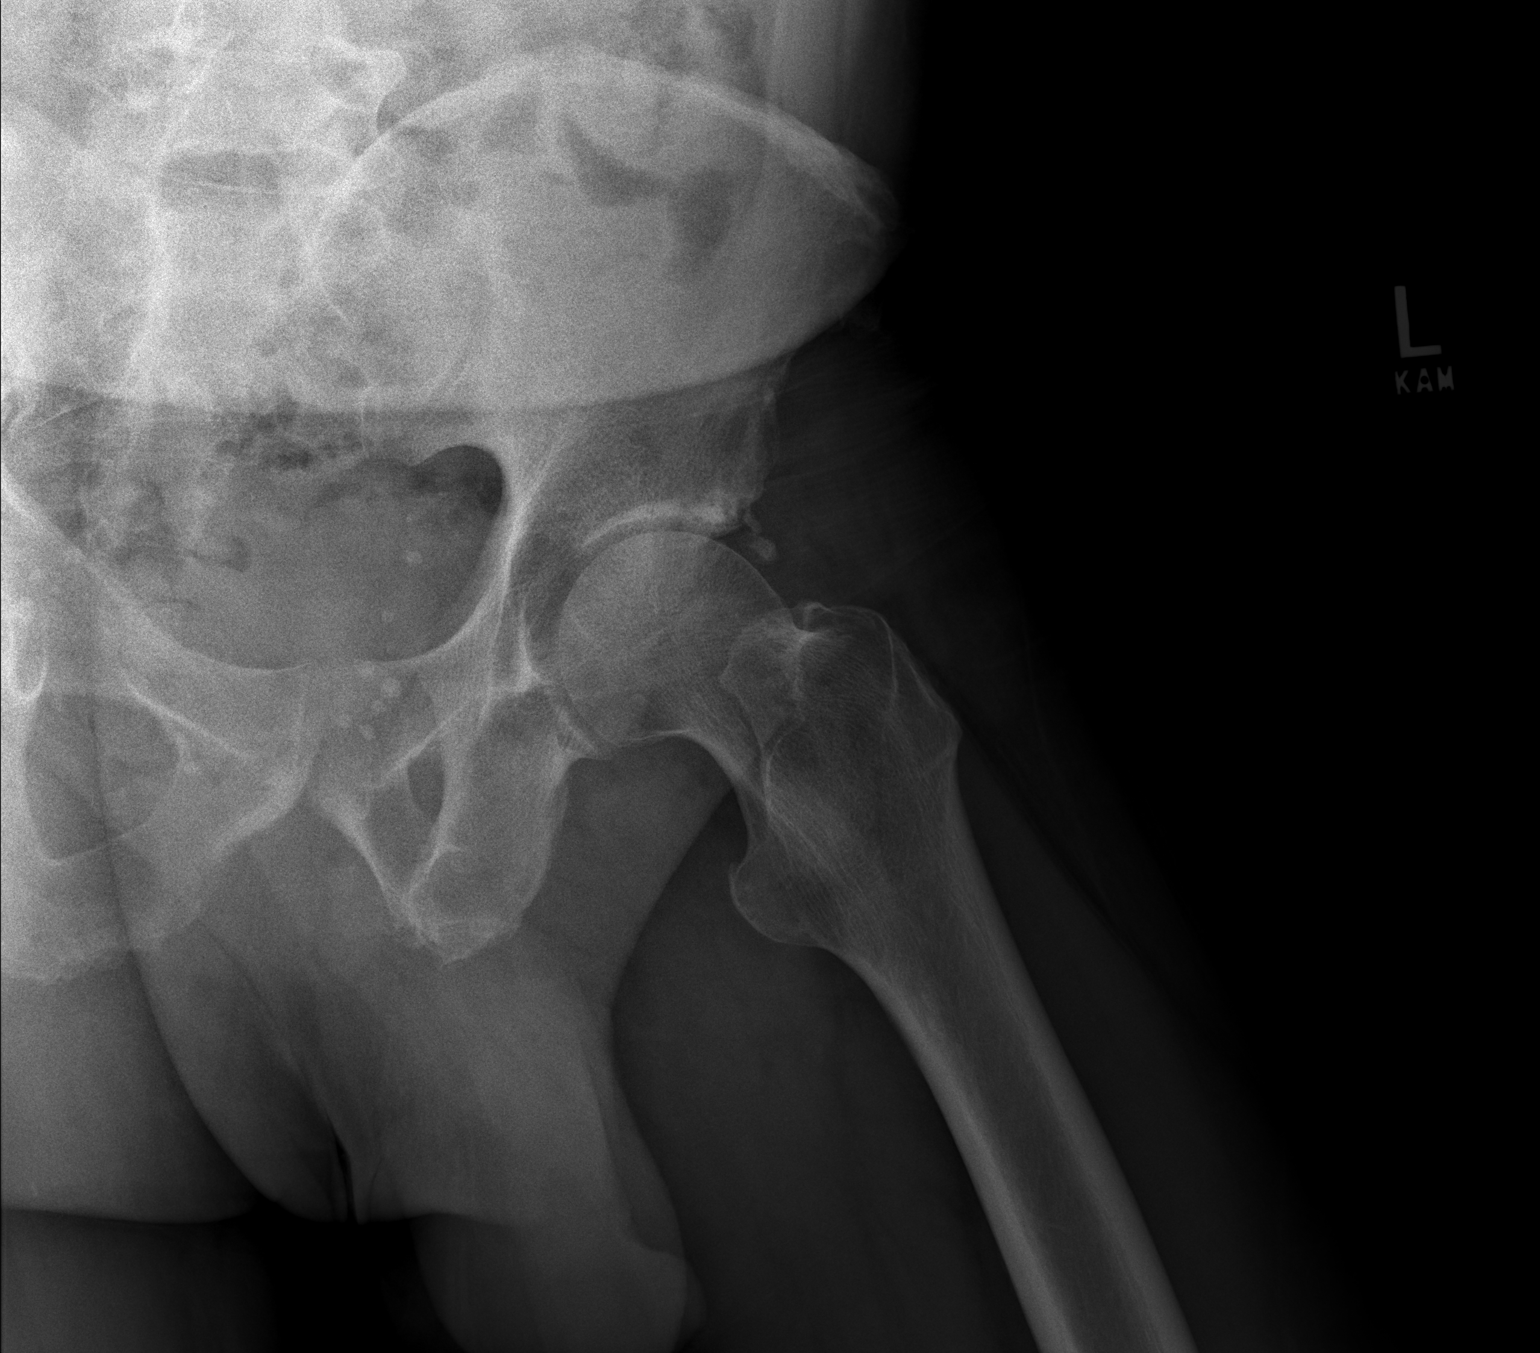

[2 of 2 positions shown; findings below may reference images not displayed]

FINDINGS: Normal alignment. No fracture or dislocation. Mild left hip
degenerative arthritis. Soft tissues are unremarkable.
IMPRESSION: Mild left hip degenerative arthritis, stable since prior
examination.

## 2022-04-01 ENCOUNTER — Telehealth: Payer: Self-pay | Admitting: Hematology and Oncology

## 2022-04-01 NOTE — Telephone Encounter (Signed)
Called patient to r/s 3/7 appointment due to provider PAL. Patient r/s and notified.  

## 2022-04-02 ENCOUNTER — Encounter: Payer: Self-pay | Admitting: Registered Nurse

## 2022-04-02 ENCOUNTER — Encounter: Payer: Medicare Other | Attending: Physical Medicine & Rehabilitation | Admitting: Registered Nurse

## 2022-04-02 VITALS — BP 145/86 | HR 65 | Ht 68.0 in | Wt 181.0 lb

## 2022-04-02 DIAGNOSIS — G894 Chronic pain syndrome: Secondary | ICD-10-CM | POA: Diagnosis not present

## 2022-04-02 DIAGNOSIS — M47817 Spondylosis without myelopathy or radiculopathy, lumbosacral region: Secondary | ICD-10-CM | POA: Insufficient documentation

## 2022-04-02 DIAGNOSIS — Z5181 Encounter for therapeutic drug level monitoring: Secondary | ICD-10-CM | POA: Insufficient documentation

## 2022-04-02 DIAGNOSIS — M7062 Trochanteric bursitis, left hip: Secondary | ICD-10-CM | POA: Diagnosis not present

## 2022-04-02 DIAGNOSIS — Z79891 Long term (current) use of opiate analgesic: Secondary | ICD-10-CM | POA: Diagnosis not present

## 2022-04-02 MED ORDER — HYDROCODONE-ACETAMINOPHEN 10-325 MG PO TABS: 1.0000 | ORAL_TABLET | Freq: Two times a day (BID) | ORAL | 0 refills | Status: DC | PRN

## 2022-04-02 NOTE — Progress Notes (Signed)
Subjective:    Patient ID: Shaun Lang, male    DOB: Sep 03, 1955, 67 y.o.   MRN: [id number]  HPI: Shaun Lang is a 67 y.o. male who returns for follow up appointment for chronic pain and medication refill. He states his pain is located in his lower back and left hip pain. She rates her pain 8. current exercise regime is walking and performing stretching exercises.  [name] Morphine equivalent is 25.00 MME.   Last UDS was Performed on 02/05/2022, it was consistent.    Pain Inventory Average Pain 9 Pain Right Now 8 My pain is intermittent, constant, tingling, and aching  In the last 24 hours, has pain interfered with the following? General activity 6 Relation with others 8 Enjoyment of life 6 What TIME of day is your pain at its worst? daytime and night Sleep (in general) Poor  Pain is worse with: bending Pain improves with: medication Relief from Meds: 8  Family History  Problem Relation Age of Onset   Diabetes Mother    Cancer Sister        breast   Diabetes Sister    Stroke Sister 16   Breast cancer Sister    Colon cancer Neg Hx    Esophageal cancer Neg Hx    Rectal cancer Neg Hx    Stomach cancer Neg Hx    Social History   Socioeconomic History   Marital status: Single    Spouse name: Not on file   Number of children: Not on file   Years of education: Not on file   Highest education level: Not on file  Occupational History    Comment: unemployed  Tobacco Use   Smoking status: Every Day    Packs/day: 0.50    Years: 33.00    Total pack years: 16.50    Types: Cigarettes   Smokeless tobacco: Never  Vaping Use   Vaping Use: Never used  Substance and Sexual Activity   Alcohol use: Not Currently    Alcohol/week: 2.0 standard drinks of alcohol    Types: 2 Cans of beer per week   Drug use: No   Sexual activity: Yes    Birth control/protection: None  Other Topics Concern   Not on file  Social History Narrative   Not on file   Social Determinants of  Health   Financial Resource Strain: Low Risk  (10/07/2021)   Overall Financial Resource Strain (CARDIA)    Difficulty of Paying Living Expenses: Not hard at all  Food Insecurity: No Food Insecurity (10/07/2021)   Hunger Vital Sign    Worried About Running Out of Food in the Last Year: Never true    Ran Out of Food in the Last Year: Never true  Transportation Needs: No Transportation Needs (03/09/2022)   PRAPARE - Hydrologist (Medical): No    Lack of Transportation (Non-Medical): No  Physical Activity: Inactive (10/07/2021)   Exercise Vital Sign    Days of Exercise per Week: 0 days    Minutes of Exercise per Session: 0 min  Stress: No Stress Concern Present (10/07/2021)   [place]    Feeling of Stress : Not at all  Social Connections: Socially Isolated (10/07/2021)   Social Connection and Isolation Panel [NHANES]    Frequency of Communication with Friends and Family: Three times a week    Frequency of Social Gatherings with Friends and Family: Three times a  week    Attends Religious Services: Never    Active Member of Clubs or Organizations: No    Attends Archivist Meetings: Never    Marital Status: Divorced   Past Surgical History:  Procedure Laterality Date   BUNIONECTOMY Left 07/15/2017   with big toe fracture repair   COLONOSCOPY  07/18/2012   polyps-Dr.Outlaw   [facility] REPAIR  04/1998   [facility]   NECK SURGERY  07/21/02 and 01/2006   WRIST SURGERY  1992   right   Past Surgical History:  Procedure Laterality Date   BUNIONECTOMY Left 07/15/2017   with big toe fracture repair   COLONOSCOPY  07/18/2012   polyps-Dr.Outlaw   HERNIA REPAIR  04/1998   [facility]   NECK SURGERY  07/21/02 and 01/2006   WRIST SURGERY  1992   right   Past Medical History:  Diagnosis Date   Bell's palsy April 2010   Chest pain with low risk for cardiac etiology    Low Risk Myoview Sept 2013   DJD  (degenerative joint disease)    disabilty secondary to neck issues   Hyperlipidemia    Hypertension    Sleep apnea    does not use CPAP   Smoker    There were no vitals taken for this visit.  Opioid Risk Score:   Fall Risk Score:  `1  Depression screen PHQ 2/9     02/05/2022   11:12 AM 12/10/2021    1:00 PM 10/07/2021   11:28 AM 10/07/2021   11:26 AM 09/22/2021    1:22 PM 07/23/2021   10:03 AM 07/14/2021    1:41 PM  Depression screen PHQ 2/9  Decreased Interest 0 0 0 0 0 0 0  Down, Depressed, Hopeless 0 0 0 0 0 0 0  PHQ - 2 Score 0 0 0 0 0 0 0  Altered sleeping       1  Tired, decreased energy       3  Change in appetite       0  Feeling bad or failure about yourself        0  Trouble concentrating       0  Moving slowly or fidgety/restless       1  Suicidal thoughts       0  PHQ-9 Score       5  Difficult doing work/chores       Not difficult at all      Review of Systems  Musculoskeletal:  Positive for back pain and neck pain.       Left hip  All other systems reviewed and are negative.      Objective:   Physical Exam Vitals and nursing note reviewed.  Constitutional:      Appearance: Normal appearance.  Cardiovascular:     Rate and Rhythm: Normal rate and regular rhythm.     Pulses: Normal pulses.     Heart sounds: Normal heart sounds.  Pulmonary:     Effort: Pulmonary effort is normal.     Breath sounds: Normal breath sounds.  Musculoskeletal:     Cervical back: Normal range of motion and neck supple.     Comments: Normal Muscle Bulk and Muscle Testing Reveals:  Upper Extremities: Full ROM and Muscle Strength 5/5 Lumbar Paraspinal Tenderness: L- 4-L-5 Lower Extremities: Full ROM and Muscle Strength 5/5 Left Greater Trochanter Tenderness Arises from Table with ease Narrow Based  Gait     Skin:  General: Skin is warm and dry.  Neurological:     Mental Status: He is alert and oriented to person, place, and time.  Psychiatric:        Mood and  Affect: Mood normal.        Behavior: Behavior normal.         Assessment & Plan:  1. Chronic left inguinal pain: S/P inguinal hernia (indirect) and mesh repair. Continue to Monitor. 01/11//2024. Refilled: Hydrocodone 10/325 mg one tablet  twice a day  as needed #40.Second script sent for the following month. We will continue the opioid monitoring program, this consists of regular clinic visits, examinations, urine drug screen, pill counts as well as use of [place] Controlled Substance Reporting system. A 12 month History has been reviewed on the [place] Controlled Substance Reporting System on 04/02/2022.  2. Lumbosacral Spondylosis: Continue with Exercise regime. Continue to Monitor. 04/02/2022 3. Muscle Spasm: Continue current medication regimen with Flexeril. Continue to monitor.04/02/2022  4.Chronic Right Shoulder Pain :No complaints today.  Chronic Pain Syndrome:  Continue HEP as tolerated.  Continue to Monitor. 04/02/2022.  5. Cervicalgia/ Cervical Radiculitis: Continue HEP as Tolerated. Alternate heat and Ice Therapy as tolerated. Continue current medication regimen. Continue to Monitor. 04/02/2022 6. Left Greater Trochanteric Tenderness: Alternate with Ice and Heat Therapy. Continue to Monitor. 04/02/2022 7. Left Shoulder Pain: No complaints today. Continue with HEP as tolerated and alternate with Ice  and Heat  Therapy. 04/02/2022   F/U in 2 months

## 2022-04-14 ENCOUNTER — Telehealth: Payer: Self-pay | Admitting: Registered Nurse

## 2022-04-14 NOTE — Telephone Encounter (Signed)
Medications reviewed. Mr. Fretwell prescriptions pharmacies were sent to pharmacy  last week. Sent Mr. Peters a prescription to call his pharmacy.

## 2022-05-28 ENCOUNTER — Ambulatory Visit: Payer: Medicare Other | Admitting: Hematology and Oncology

## 2022-05-28 ENCOUNTER — Other Ambulatory Visit: Payer: Medicare Other

## 2022-06-03 ENCOUNTER — Telehealth: Payer: Self-pay | Admitting: *Deleted

## 2022-06-03 ENCOUNTER — Encounter: Payer: Self-pay | Admitting: Registered Nurse

## 2022-06-03 ENCOUNTER — Encounter: Payer: 59 | Attending: Physical Medicine & Rehabilitation | Admitting: Registered Nurse

## 2022-06-03 VITALS — BP 149/87 | HR 65 | Ht 68.0 in | Wt 177.0 lb

## 2022-06-03 DIAGNOSIS — Z79891 Long term (current) use of opiate analgesic: Secondary | ICD-10-CM | POA: Diagnosis not present

## 2022-06-03 DIAGNOSIS — M47817 Spondylosis without myelopathy or radiculopathy, lumbosacral region: Secondary | ICD-10-CM | POA: Insufficient documentation

## 2022-06-03 DIAGNOSIS — G894 Chronic pain syndrome: Secondary | ICD-10-CM | POA: Insufficient documentation

## 2022-06-03 DIAGNOSIS — Z5181 Encounter for therapeutic drug level monitoring: Secondary | ICD-10-CM | POA: Diagnosis not present

## 2022-06-03 MED ORDER — HYDROCODONE-ACETAMINOPHEN 10-325 MG PO TABS: 1.0000 | ORAL_TABLET | Freq: Two times a day (BID) | ORAL | 0 refills | Status: DC | PRN

## 2022-06-03 NOTE — Progress Notes (Signed)
Subjective:    Patient ID: Shaun Lang, male    DOB: [birth year], 67 y.o.   MRN: [id number]  HPI: Shaun Lang is a 67 y.o. male who returns for follow up appointment for chronic pain and medication refill. He states his pain is located in his lower back, he reports he hurt his lower back a few weeks ago and took more of his hydrocodone than prescribed. We reviewed the narcotic policy, he never called the office and was educated on self medicating and a warning letter was given. He verbalizes understanding and understand no early re-fill will be prescribed. He also was instructed to call office if he develops a change in his pain and only take his medication as prescribed, he verbalizes understanding. He rates his pain 9. His current exercise regime is walking and performing stretching exercises.  [name] equivalent is 19.64 MME.   Last UDS was Performed on 02/05/2022, it was consistent.     Pain Inventory Average Pain 8 Pain Right Now 9 My pain is stabbing, tingling, and aching  In the last 24 hours, has pain interfered with the following? General activity 8 Relation with others 8 Enjoyment of life 8 What TIME of day is your pain at its worst? morning  and night Sleep (in general) Poor  Pain is worse with: walking, bending, and sitting Pain improves with:  n/a Relief from Meds: 7  Family History  Problem Relation Age of Onset   Diabetes Mother    Cancer Sister        breast   Diabetes Sister    Stroke Sister 49   Breast cancer Sister    Colon cancer Neg Hx    Esophageal cancer Neg Hx    Rectal cancer Neg Hx    Stomach cancer Neg Hx    Social History   Socioeconomic History   Marital status: Single    Spouse name: Not on file   Number of children: Not on file   Years of education: Not on file   Highest education level: Not on file  Occupational History    Comment: unemployed  Tobacco Use   Smoking status: Every Day    Packs/day: 0.50    Years: 33.00     Total pack years: 16.50    Types: Cigarettes   Smokeless tobacco: Never  Vaping Use   Vaping Use: Never used  Substance and Sexual Activity   Alcohol use: Not Currently    Alcohol/week: 2.0 standard drinks of alcohol    Types: 2 Cans of beer per week   Drug use: No   Sexual activity: Yes    Birth control/protection: None  Other Topics Concern   Not on file  Social History Narrative   Not on file   Social Determinants of Health   Financial Resource Strain: Low Risk  (10/07/2021)   Overall Financial Resource Strain (CARDIA)    Difficulty of Paying Living Expenses: Not hard at all  Food Insecurity: No Food Insecurity (10/07/2021)   Hunger Vital Sign    Worried About Running Out of Food in the Last Year: Never true    Ran Out of Food in the Last Year: Never true  Transportation Needs: No Transportation Needs (03/09/2022)   PRAPARE - Hydrologist (Medical): No    Lack of Transportation (Non-Medical): No  Physical Activity: Inactive (10/07/2021)   Exercise Vital Sign    Days of Exercise per Week: 0 days  Minutes of Exercise per Session: 0 min  Stress: No Stress Concern Present (10/07/2021)   [place]    Feeling of Stress : Not at all  Social Connections: Socially Isolated (10/07/2021)   Social Connection and Isolation Panel [NHANES]    Frequency of Communication with Friends and Family: Three times a week    Frequency of Social Gatherings with Friends and Family: Three times a week    Attends Religious Services: Never    Active Member of Clubs or Organizations: No    Attends Archivist Meetings: Never    Marital Status: Divorced   Past Surgical History:  Procedure Laterality Date   BUNIONECTOMY Left 07/15/2017   with big toe fracture repair   COLONOSCOPY  07/18/2012   polyps-Dr.Outlaw   HERNIA REPAIR  04/1998   [facility]   NECK SURGERY  07/21/02 and 01/2006   WRIST  SURGERY  1992   right   Past Surgical History:  Procedure Laterality Date   BUNIONECTOMY Left 07/15/2017   with big toe fracture repair   COLONOSCOPY  07/18/2012   polyps-Dr.Outlaw   HERNIA REPAIR  04/1998   [facility]   NECK SURGERY  07/21/02 and 01/2006   WRIST SURGERY  1992   right   Past Medical History:  Diagnosis Date   Bell's palsy April 2010   Chest pain with low risk for cardiac etiology    Low Risk Myoview Sept 2013   DJD (degenerative joint disease)    disabilty secondary to neck issues   Hyperlipidemia    Hypertension    Sleep apnea    does not use CPAP   Smoker    BP (!) 149/87   Pulse 65   Ht '5\' 8"'$  (1.727 m)   Wt 177 lb (80.3 kg)   SpO2 98%   BMI 26.91 kg/m   Opioid Risk Score:   Fall Risk Score:  `1  Depression screen PHQ 2/9     04/02/2022   11:10 AM 02/05/2022   11:12 AM 12/10/2021    1:00 PM 10/07/2021   11:28 AM 10/07/2021   11:26 AM 09/22/2021    1:22 PM 07/23/2021   10:03 AM  Depression screen PHQ 2/9  Decreased Interest 0 0 0 0 0 0 0  Down, Depressed, Hopeless 0 0 0 0 0 0 0  PHQ - 2 Score 0 0 0 0 0 0 0     Review of Systems  Musculoskeletal:  Positive for back pain and neck pain.  All other systems reviewed and are negative.      Objective:   Physical Exam Vitals and nursing note reviewed.  Constitutional:      Appearance: Normal appearance.  Cardiovascular:     Rate and Rhythm: Normal rate and regular rhythm.     Pulses: Normal pulses.     Heart sounds: Normal heart sounds.  Pulmonary:     Effort: Pulmonary effort is normal.     Breath sounds: Normal breath sounds.  Musculoskeletal:     Cervical back: Normal range of motion and neck supple.     Comments: Normal Muscle Bulk and Muscle Testing Reveals:  Upper Extremities: Full ROM and Muscle Strength 5/5  Lumbar Paraspinal Tenderness: L-3-L-5 Mainly Left Side Lower Extremities: Full ROM and Muscle Strength 5/5 Arises from Chair with ease Narrow Based  Gait     Skin:    General:  Skin is warm and dry.  Neurological:  Mental Status: He is alert and oriented to person, place, and time.  Psychiatric:        Mood and Affect: Mood normal.        Behavior: Behavior normal.         Assessment & Plan:  1. Chronic left inguinal pain: S/P inguinal hernia (indirect) and mesh repair. Continue to Monitor. 03/13//2024. Refilled: Hydrocodone 10/325 mg one tablet  twice a day  as needed #55.Second script sent for the following month. We will continue the opioid monitoring program, this consists of regular clinic visits, examinations, urine drug screen, pill counts as well as use of [place] Controlled Substance Reporting system. A 12 month History has been reviewed on the [place] on 06/03/2022.  2. Lumbosacral Spondylosis: Continue with Home Exercise regime. Continue to Monitor. 06/03/2022 3. Muscle Spasm: Continue current medication regimen with Flexeril. Continue to monitor.06/03/2022  4.Chronic Right Shoulder Pain :No complaints today.  Chronic Pain Syndrome:  Continue HEP as tolerated.  Continue to Monitor. 06/03/2022.  5. Cervicalgia/ Cervical Radiculitis: No complaints today. Continue HEP as Tolerated. Alternate heat and Ice Therapy as tolerated. Continue current medication regimen. Continue to Monitor. 06/03/2022 6. Left Greater Trochanteric Tenderness: No complaints today. Alternate with Ice and Heat Therapy. Continue to Monitor. 06/03/2022 7. Left Shoulder Pain: No complaints today. Continue with HEP as tolerated and alternate with Ice  and Heat  Therapy. 06/03/2022   F/U in 2 months

## 2022-06-03 NOTE — Telephone Encounter (Signed)
Formal warning letter for taking too much of his narcotic medication this month.

## 2022-06-05 ENCOUNTER — Other Ambulatory Visit: Payer: Self-pay | Admitting: Hematology and Oncology

## 2022-06-05 ENCOUNTER — Inpatient Hospital Stay: Payer: 59 | Attending: Nurse Practitioner

## 2022-06-05 ENCOUNTER — Other Ambulatory Visit: Payer: Self-pay

## 2022-06-05 ENCOUNTER — Inpatient Hospital Stay (HOSPITAL_BASED_OUTPATIENT_CLINIC_OR_DEPARTMENT_OTHER): Payer: 59 | Admitting: Hematology and Oncology

## 2022-06-05 VITALS — BP 186/97 | HR 64 | Temp 98.0°F | Resp 13 | Wt 177.9 lb

## 2022-06-05 DIAGNOSIS — D61818 Other pancytopenia: Secondary | ICD-10-CM

## 2022-06-05 DIAGNOSIS — F17210 Nicotine dependence, cigarettes, uncomplicated: Secondary | ICD-10-CM | POA: Insufficient documentation

## 2022-06-05 DIAGNOSIS — D649 Anemia, unspecified: Secondary | ICD-10-CM

## 2022-06-05 DIAGNOSIS — Z803 Family history of malignant neoplasm of breast: Secondary | ICD-10-CM | POA: Insufficient documentation

## 2022-06-05 DIAGNOSIS — D696 Thrombocytopenia, unspecified: Secondary | ICD-10-CM | POA: Diagnosis not present

## 2022-06-05 LAB — CBC WITH DIFFERENTIAL (CANCER CENTER ONLY)
Abs Immature Granulocytes: 0 10*3/uL (ref 0.00–0.07)
Basophils Absolute: 0 10*3/uL (ref 0.0–0.1)
Basophils Relative: 1 %
Eosinophils Absolute: 0.1 10*3/uL (ref 0.0–0.5)
Eosinophils Relative: 2 %
HCT: 37.4 % — ABNORMAL LOW (ref 39.0–52.0)
Hemoglobin: 12.4 g/dL — ABNORMAL LOW (ref 13.0–17.0)
Immature Granulocytes: 0 %
Lymphocytes Relative: 42 %
Lymphs Abs: 1.2 10*3/uL (ref 0.7–4.0)
MCH: 29.1 pg (ref 26.0–34.0)
MCHC: 33.2 g/dL (ref 30.0–36.0)
MCV: 87.8 fL (ref 80.0–100.0)
Monocytes Absolute: 0.3 10*3/uL (ref 0.1–1.0)
Monocytes Relative: 10 %
Neutro Abs: 1.3 10*3/uL — ABNORMAL LOW (ref 1.7–7.7)
Neutrophils Relative %: 45 %
Platelet Count: 121 10*3/uL — ABNORMAL LOW (ref 150–400)
RBC: 4.26 MIL/uL (ref 4.22–5.81)
RDW: 13.8 % (ref 11.5–15.5)
WBC Count: 2.8 10*3/uL — ABNORMAL LOW (ref 4.0–10.5)
nRBC: 0 % (ref 0.0–0.2)

## 2022-06-05 LAB — CMP (CANCER CENTER ONLY)
ALT: 16 U/L (ref 0–44)
AST: 18 U/L (ref 15–41)
Albumin: 4.4 g/dL (ref 3.5–5.0)
Alkaline Phosphatase: 50 U/L (ref 38–126)
Anion gap: 5 (ref 5–15)
BUN: 14 mg/dL (ref 8–23)
CO2: 27 mmol/L (ref 22–32)
Calcium: 8.9 mg/dL (ref 8.9–10.3)
Chloride: 107 mmol/L (ref 98–111)
Creatinine: 1 mg/dL (ref 0.61–1.24)
GFR, Estimated: >60 mL/min (ref >60)
Glucose, Bld: 108 mg/dL — ABNORMAL HIGH (ref 70–99)
Potassium: 4.1 mmol/L (ref 3.5–5.1)
Sodium: 139 mmol/L (ref 135–145)
Total Bilirubin: 0.4 mg/dL (ref 0.3–1.2)
Total Protein: 7.2 g/dL (ref 6.5–8.1)

## 2022-06-05 LAB — LACTATE DEHYDROGENASE: LDH: 126 U/L (ref 98–192)

## 2022-06-05 NOTE — Progress Notes (Unsigned)
[place] [phone]   [phone]  PROGRESS NOTE  Patient Care Team: Nche, Charlene Brooke, NP as PCP - General (Internal Medicine)  CHIEF COMPLAINTS/PURPOSE OF CONSULTATION:  Pancytopenia  HISTORY OF PRESENTING ILLNESS:  Shaun Lang 67 y.o. male returns for a follow up for pancytopenia. He was last seen on 10/01/2021 to establish care. In the interim, he has completed workup including serologic studies, abdominal ultrasound and bone marrow biopsy. He is unaccompanied for this visit.   Mr. Gonzalezlopez reports that his energy levels are unchanged since the last visit. He is still able to complete all his daily activities on his own. He denies any appetite changes or weight loss. He denies any GI symptoms including nausea, vomiting, diarrhea or constipation. He denies any easy bruising or signs of active bleeding. He denies fevers, chills, sweats, shortness of breath, chest pain or cough. He has no other complaints. He has no other complaints.   MEDICAL HISTORY:  Past Medical History:  Diagnosis Date   Bell's palsy April 2010   Chest pain with low risk for cardiac etiology    Low Risk Myoview Sept 2013   DJD (degenerative joint disease)    disabilty secondary to neck issues   Hyperlipidemia    Hypertension    Sleep apnea    does not use CPAP   Smoker     SURGICAL HISTORY: Past Surgical History:  Procedure Laterality Date   BUNIONECTOMY Left 07/15/2017   with big toe fracture repair   COLONOSCOPY  07/18/2012   polyps-Dr.Outlaw   HERNIA REPAIR  04/1998   [facility]   NECK SURGERY  07/21/02 and 01/2006   WRIST SURGERY  1992   right    SOCIAL HISTORY: Social History   Socioeconomic History   Marital status: Single    Spouse name: Not on file   Number of children: Not on file   Years of education: Not on file   Highest education level: Not on file  Occupational History    Comment: unemployed  Tobacco Use   Smoking status: Every Day     Packs/day: 0.50    Years: 33.00    Additional pack years: 0.00    Total pack years: 16.50    Types: Cigarettes   Smokeless tobacco: Never  Vaping Use   Vaping Use: Never used  Substance and Sexual Activity   Alcohol use: Not Currently    Alcohol/week: 2.0 standard drinks of alcohol    Types: 2 Cans of beer per week   Drug use: No   Sexual activity: Yes    Birth control/protection: None  Other Topics Concern   Not on file  Social History Narrative   Not on file   Social Determinants of Health   Financial Resource Strain: Low Risk  (10/07/2021)   Overall Financial Resource Strain (CARDIA)    Difficulty of Paying Living Expenses: Not hard at all  Food Insecurity: No Food Insecurity (10/07/2021)   Hunger Vital Sign    Worried About Running Out of Food in the Last Year: Never true    Ran Out of Food in the Last Year: Never true  Transportation Needs: No Transportation Needs (03/09/2022)   PRAPARE - Hydrologist (Medical): No    Lack of Transportation (Non-Medical): No  Physical Activity: Inactive (10/07/2021)   Exercise Vital Sign    Days of Exercise per Week: 0 days    Minutes of Exercise per Session: 0 min  Stress:  No Stress Concern Present (10/07/2021)   [place]    Feeling of Stress : Not at all  Social Connections: Socially Isolated (10/07/2021)   Social Connection and Isolation Panel [NHANES]    Frequency of Communication with Friends and Family: Three times a week    Frequency of Social Gatherings with Friends and Family: Three times a week    Attends Religious Services: Never    Active Member of Clubs or Organizations: No    Attends Archivist Meetings: Never    Marital Status: Divorced  Human resources officer Violence: Not At Risk (10/07/2021)   Humiliation, Afraid, Rape, and Kick questionnaire    Fear of Current or Ex-Partner: No    Emotionally Abused: No     Physically Abused: No    Sexually Abused: No    FAMILY HISTORY: Family History  Problem Relation Age of Onset   Diabetes Mother    Cancer Sister        breast   Diabetes Sister    Stroke Sister 76   Breast cancer Sister    Colon cancer Neg Hx    Esophageal cancer Neg Hx    Rectal cancer Neg Hx    Stomach cancer Neg Hx     ALLERGIES:  has No Known Allergies.  MEDICATIONS:  Current Outpatient Medications  Medication Sig Dispense Refill   cyclobenzaprine (FLEXERIL) 10 MG tablet TAKE 1 TABLET BY MOUTH TWICE DAILY AS NEEDED FOR MUSCLE SPASM 60 tablet 2   gabapentin (NEURONTIN) 300 MG capsule Take 1 capsule (300 mg total) by mouth 2 (two) times daily. 60 capsule 2   HYDROcodone-acetaminophen (NORCO) 10-325 MG tablet Take 1 tablet by mouth 2 (two) times daily as needed for moderate pain. 55 tablet 0   lisinopril (ZESTRIL) 5 MG tablet Take 1 tablet (5 mg total) by mouth daily. 90 tablet 3   Multiple Vitamin (MULTIVITAMIN WITH MINERALS) TABS tablet Take 1 tablet by mouth daily.     rosuvastatin (CRESTOR) 40 MG tablet Take 1 tablet (40 mg total) by mouth daily. 90 tablet 3   triamterene-hydrochlorothiazide (MAXZIDE-25) 37.5-25 MG tablet Take 1 tablet by mouth daily. 90 tablet 1   No current facility-administered medications for this visit.    REVIEW OF SYSTEMS:   Constitutional: ( - ) fevers, ( - )  chills , ( - ) night sweats Eyes: ( - ) blurriness of vision, ( - ) double vision, ( - ) watery eyes Ears, nose, mouth, throat, and face: ( - ) mucositis, ( - ) sore throat Respiratory: ( - ) cough, ( - ) dyspnea, ( - ) wheezes Cardiovascular: ( - ) palpitation, ( - ) chest discomfort, ( - ) lower extremity swelling Gastrointestinal:  ( - ) nausea, ( - ) heartburn, ( - ) change in bowel habits Skin: ( - ) abnormal skin rashes Lymphatics: ( - ) new lymphadenopathy, ( - ) easy bruising Neurological: ( - ) numbness, ( - ) tingling, ( - ) new weaknesses Behavioral/Psych: ( - ) mood change,  ( - ) new changes  All other systems were reviewed with the patient and are negative.  PHYSICAL EXAMINATION: ECOG PERFORMANCE STATUS: 0 - Asymptomatic  Vitals:   06/05/22 0934  BP: (!) 186/97  Pulse: 64  Resp: 13  Temp: 98 F (36.7 C)  SpO2: 100%   Filed Weights   06/05/22 0934  Weight: 177 lb 14.4 oz (80.7 kg)    GENERAL: well  appearing male in NAD  SKIN: skin color, texture, turgor are normal, no rashes or significant lesions EYES: conjunctiva are pink and non-injected, sclera clear LUNGS: clear to auscultation and percussion with normal breathing effort HEART: regular rate & rhythm and no murmurs and no lower extremity edema Musculoskeletal: no cyanosis of digits and no clubbing  PSYCH: alert & oriented x 3, fluent speech NEURO: no focal motor/sensory deficits  LABORATORY DATA:  I have reviewed the data as listed    Latest Ref Rng & Units 06/05/2022    9:16 AM 11/28/2021    3:15 PM 11/18/2021    8:05 AM  CBC  WBC 4.0 - 10.5 K/uL 2.8  3.6  2.9   Hemoglobin 13.0 - 17.0 g/dL 12.4  11.8  12.7   Hematocrit 39.0 - 52.0 % 37.4  35.3  39.8   Platelets 150 - 400 K/uL 121  123  124        Latest Ref Rng & Units 06/05/2022    9:16 AM 11/28/2021    3:15 PM 10/01/2021    2:59 PM  CMP  Glucose 70 - 99 mg/dL 108  115  86   BUN 8 - 23 mg/dL 14  16  16    Creatinine 0.61 - 1.24 mg/dL 1.00  1.10  1.14   Sodium 135 - 145 mmol/L 139  137  141   Potassium 3.5 - 5.1 mmol/L 4.1  3.7  3.8   Chloride 98 - 111 mmol/L 107  108  108   CO2 22 - 32 mmol/L 27  26  27    Calcium 8.9 - 10.3 mg/dL 8.9  8.7  9.7   Total Protein 6.5 - 8.1 g/dL 7.2  6.7  7.7   Total Bilirubin 0.3 - 1.2 mg/dL 0.4  0.4  0.5   Alkaline Phos 38 - 126 U/L 50  47  52   AST 15 - 41 U/L 18  16  20    ALT 0 - 44 U/L 16  11  20      RADIOGRAPHIC STUDIES: I have personally reviewed the radiological images as listed and agreed with the findings in the report. No results found.  ASSESSMENT & PLAN VANDERBILT PORTEE is a 67  y.o. male who returns for a follow up for pancytopenia.    #Pancytopenia--etiology unknown: --Workup from 10/01/2021 ruled out nutritional deficiencies, hepatitis B and C and paraproteinemia. HIV serology negative from 07/17/2021.  --Abdominal US from 10/20/2021 was normal without any liver disease or splenomegaly  --Underwent bone marrow biopsy form 11/18/2021. Findings showed normocellular bone marrow with trilineage hematopoiesis.  --Labs today were reviewed that showed *** --Recommend to monitor for now as levels have been stable.  --RTC in 12 months with repeat labs.   No orders of the defined types were placed in this encounter.   All questions were answered. The patient knows to call the clinic with any problems, questions or concerns.  I have spent a total of 25 minutes minutes of face-to-face and non-face-to-face time, preparing to see the patient,  performing a medically appropriate examination, counseling and educating the patient,  documenting clinical information in the electronic health record,and care coordination.   Dede Query, PA-C Department of Hematology/Oncology [place] [facility] Phone: [phone]

## 2022-07-17 ENCOUNTER — Encounter: Payer: Self-pay | Admitting: Nurse Practitioner

## 2022-07-17 ENCOUNTER — Ambulatory Visit (INDEPENDENT_AMBULATORY_CARE_PROVIDER_SITE_OTHER): Payer: 59 | Admitting: Nurse Practitioner

## 2022-07-17 ENCOUNTER — Telehealth: Payer: Self-pay

## 2022-07-17 VITALS — BP 130/80 | HR 66 | Temp 98.6°F | Resp 16 | Ht 68.0 in | Wt 178.0 lb

## 2022-07-17 DIAGNOSIS — I10 Essential (primary) hypertension: Secondary | ICD-10-CM

## 2022-07-17 DIAGNOSIS — R739 Hyperglycemia, unspecified: Secondary | ICD-10-CM

## 2022-07-17 DIAGNOSIS — B351 Tinea unguium: Secondary | ICD-10-CM | POA: Diagnosis not present

## 2022-07-17 DIAGNOSIS — F17200 Nicotine dependence, unspecified, uncomplicated: Secondary | ICD-10-CM | POA: Diagnosis not present

## 2022-07-17 DIAGNOSIS — E782 Mixed hyperlipidemia: Secondary | ICD-10-CM

## 2022-07-17 DIAGNOSIS — Z23 Encounter for immunization: Secondary | ICD-10-CM | POA: Diagnosis not present

## 2022-07-17 LAB — LIPID PANEL
Cholesterol: 190 mg/dL (ref 0–200)
HDL: 61 mg/dL (ref >39.00)
LDL Cholesterol: 114 mg/dL — ABNORMAL HIGH (ref 0–99)
NonHDL: 128.57
Total CHOL/HDL Ratio: 3
Triglycerides: 73 mg/dL (ref 0.0–149.0)
VLDL: 14.6 mg/dL (ref 0.0–40.0)

## 2022-07-17 LAB — HEMOGLOBIN A1C: Hgb A1c MFr Bld: 6.1 % (ref 4.6–6.5)

## 2022-07-17 MED ORDER — TRIAMTERENE-HCTZ 37.5-25 MG PO TABS: 1.0000 | ORAL_TABLET | Freq: Every day | ORAL | 1 refills | Status: DC

## 2022-07-17 MED ORDER — TERBINAFINE HCL 250 MG PO TABS: 250.0000 mg | ORAL_TABLET | Freq: Every day | ORAL | 0 refills | Status: DC

## 2022-07-17 NOTE — Progress Notes (Signed)
Established Patient Visit  Patient: Shaun Lang   DOB: Jan 31, 1956   67 y.o. Male  MRN: [id number] Visit Date: 07/17/2022  Subjective:    Chief Complaint  Patient presents with   Hypertension   Hyperlipidemia    Pt is Fasting    Tobacco use disorder Smokes 1/2ppd Use of nicotine patch, but admits he is not consistent. Starts he is not ready to quit, but thinking about it Trigger: ETOH consumption Advised about need to quit, adverse effects of tobacco use (CVD, PAD, cancer and COPD). He verbalized understanding   Hypertension Improved BP with lisinopril and maxzide BP Readings from Last 3 Encounters:  07/17/22 130/80  06/05/22 (!) 186/97  06/03/22 (!) 149/87    CMP     Component Value Date/Time   NA 139 06/05/2022 0916   K 4.1 06/05/2022 0916   CL 107 06/05/2022 0916   CO2 27 06/05/2022 0916   GLUCOSE 108 (H) 06/05/2022 0916   BUN 14 06/05/2022 0916   CREATININE 1.00 06/05/2022 0916   CALCIUM 8.9 06/05/2022 0916   PROT 7.2 06/05/2022 0916   ALBUMIN 4.4 06/05/2022 0916   AST 18 06/05/2022 0916   ALT 16 06/05/2022 0916   ALKPHOS 50 06/05/2022 0916   BILITOT 0.4 06/05/2022 0916   GFRNONAA >60 06/05/2022 0916    Advised to quit tobacco use, maintain DASH diet and daily exercise Maintain med dose   Hyperlipidemia Repeat lipid panel Maintain crestor dose   Hyperglycemia Repeat hgbA1c  Onychomycosis No erythema or swelling or pain. He requested to start oral antifungal tabs. Advised about potential side effects on medications, risk of hepatotoxicity, signs of liver injury, need to stop ETOH consumption, and need to repeat hepatic panel after starting medication, and duration of treatment-12weeks He verbalized understanding and agreed to start medication.  Lamisil sent Repeat hepatic panel in 70month  Reviewed medical, surgical, and social history today  Medications: Outpatient Medications Prior to Visit  Medication Sig   cyclobenzaprine  (FLEXERIL) 10 MG tablet TAKE 1 TABLET BY MOUTH TWICE DAILY AS NEEDED FOR MUSCLE SPASM   gabapentin (NEURONTIN) 300 MG capsule Take 1 capsule (300 mg total) by mouth 2 (two) times daily.   HYDROcodone-acetaminophen (NORCO) 10-325 MG tablet Take 1 tablet by mouth 2 (two) times daily as needed for moderate pain.   lisinopril (ZESTRIL) 5 MG tablet Take 1 tablet (5 mg total) by mouth daily.   Multiple Vitamin (MULTIVITAMIN WITH MINERALS) TABS tablet Take 1 tablet by mouth daily.   rosuvastatin (CRESTOR) 40 MG tablet Take 1 tablet (40 mg total) by mouth daily.   [DISCONTINUED] triamterene-hydrochlorothiazide (MAXZIDE-25) 37.5-25 MG tablet Take 1 tablet by mouth daily.   No facility-administered medications prior to visit.   Reviewed past medical and social history.   ROS per HPI above      Objective:  BP 130/80 (BP Location: Right Arm, Patient Position: Sitting, Cuff Size: Large)   Pulse 66   Temp 98.6 F (37 C) (Temporal)   Resp 16   Ht 5\' 8"  (1.727 m)   Wt 178 lb (80.7 kg)   SpO2 98%   BMI 27.06 kg/m      Physical Exam Cardiovascular:     Rate and Rhythm: Normal rate and regular rhythm.     Pulses: Normal pulses.          Dorsalis pedis pulses are 2+ on the right side and 2+ on the  left side.       Posterior tibial pulses are 2+ on the right side and 2+ on the left side.     Heart sounds: Normal heart sounds.  Pulmonary:     Effort: Pulmonary effort is normal.     Breath sounds: Normal breath sounds.  Musculoskeletal:     Right lower leg: No edema.     Left lower leg: No edema.     Right foot: Normal range of motion. No deformity, bunion or prominent metatarsal heads.     Left foot: Normal range of motion. No deformity, bunion or prominent metatarsal heads.  Feet:     Right foot:     Protective Sensation: 6 sites tested.  6 sites sensed.     Skin integrity: Skin integrity normal.     Toenail Condition: Right toenails are abnormally thick. Fungal disease present.    Left  foot:     Protective Sensation: 6 sites tested.  6 sites sensed.     Skin integrity: Skin integrity normal.     Toenail Condition: Left toenails are abnormally thick. Fungal disease present. Neurological:     Mental Status: He is alert and oriented to person, place, and time.     Results for orders placed or performed in visit on 07/17/22  Hemoglobin A1c  Result Value Ref Range   Hgb A1c MFr Bld 6.1 4.6 - 6.5 %  Lipid panel  Result Value Ref Range   Cholesterol 190 0 - 200 mg/dL   Triglycerides 08.6 0.0 - 149.0 mg/dL   HDL 57.84 >69.62 mg/dL   VLDL 95.2 0.0 - 84.1 mg/dL   LDL Cholesterol 324 (H) 0 - 99 mg/dL   Total CHOL/HDL Ratio 3    NonHDL 128.57       Assessment & Plan:    Problem List Items Addressed This Visit       Cardiovascular and Mediastinum   Hypertension - Primary    Improved BP with lisinopril and maxzide BP Readings from Last 3 Encounters:  07/17/22 130/80  06/05/22 (!) 186/97  06/03/22 (!) 149/87    CMP     Component Value Date/Time   NA 139 06/05/2022 0916   K 4.1 06/05/2022 0916   CL 107 06/05/2022 0916   CO2 27 06/05/2022 0916   GLUCOSE 108 (H) 06/05/2022 0916   BUN 14 06/05/2022 0916   CREATININE 1.00 06/05/2022 0916   CALCIUM 8.9 06/05/2022 0916   PROT 7.2 06/05/2022 0916   ALBUMIN 4.4 06/05/2022 0916   AST 18 06/05/2022 0916   ALT 16 06/05/2022 0916   ALKPHOS 50 06/05/2022 0916   BILITOT 0.4 06/05/2022 0916   GFRNONAA >60 06/05/2022 0916    Advised to quit tobacco use, maintain DASH diet and daily exercise Maintain med dose       Relevant Medications   triamterene-hydrochlorothiazide (MAXZIDE-25) 37.5-25 MG tablet     Musculoskeletal and Integument   Onychomycosis    No erythema or swelling or pain. He requested to start oral antifungal tabs. Advised about potential side effects on medications, risk of hepatotoxicity, signs of liver injury, need to stop ETOH consumption, and need to repeat hepatic panel after starting  medication, and duration of treatment-12weeks He verbalized understanding and agreed to start medication.  Lamisil sent Repeat hepatic panel in 23month      Relevant Medications   terbinafine (LAMISIL) 250 MG tablet   Other Relevant Orders   Hepatic function panel     Other   Hyperglycemia  Repeat hgbA1c      Relevant Orders   Hemoglobin A1c (Completed)   Hyperlipidemia    Repeat lipid panel Maintain crestor dose       Relevant Medications   triamterene-hydrochlorothiazide (MAXZIDE-25) 37.5-25 MG tablet   Other Relevant Orders   Lipid panel (Completed)   Tobacco use disorder    Smokes 1/2ppd Use of nicotine patch, but admits he is not consistent. Starts he is not ready to quit, but thinking about it Trigger: ETOH consumption Advised about need to quit, adverse effects of tobacco use (CVD, PAD, cancer and COPD). He verbalized understanding       Other Visit Diagnoses     Immunization due       Relevant Orders   Tdap vaccine greater than or equal to 7yo IM (Completed)      Return in about 6 months (around 01/16/2023) for HTN, hyperlipidemia (fasting).     Alysia Penna, NP

## 2022-07-17 NOTE — Patient Outreach (Signed)
  Care Coordination   In Person Provider Office Visit Note   07/17/2022 Name: Shaun Lang MRN: [id number] DOB: [birth year]  Shaun Lang is a 67 y.o. year old male who sees Nche, Bonna Gains, NP for primary care. I engaged with Shaun Lang in the providers office today.  What matters to the patients health and wellness today?  none    Goals Addressed             This Visit's Progress    Care CoordinationActivities-No follow up required       Care Coordination Interventions: Advised patient to Annual Wellness exam. Discussed [facility] services and support. Assessed SDOH. Advised to discuss with primary care physician if services needed in the future.          SDOH assessments and interventions completed:  Yes  SDOH Interventions Today    Flowsheet Row Most Recent Value  SDOH Interventions   Housing Interventions Intervention Not Indicated  Transportation Interventions Intervention Not Indicated        Care Coordination Interventions:  Yes, provided   Follow up plan: No further intervention required.   Encounter Outcome:  Pt. Visit Completed   Bary Leriche, RN, MSN [facility] Care Management Care Management Coordinator Direct Line [phone]

## 2022-07-17 NOTE — Assessment & Plan Note (Signed)
No erythema or swelling or pain. He requested to start oral antifungal tabs. Advised about potential side effects on medications, risk of hepatotoxicity, signs of liver injury, need to stop ETOH consumption, and need to repeat hepatic panel after starting medication, and duration of treatment-12weeks He verbalized understanding and agreed to start medication.  Lamisil sent Repeat hepatic panel in 62month

## 2022-07-17 NOTE — Patient Instructions (Addendum)
Go to lab Return to lab in 89month for repeat hepatic panel Avoid alcohol consumption while taking crestor and lamisil Soak feet in vinegar and water daily.  Terbinafine Tablets What is this medication? TERBINAFINE (TER bin a feen) treats fungal infections of the nails. It belongs to a group of medications called antifungals. It will not treat infections caused by bacteria or viruses. This medicine may be used for other purposes; ask your health care provider or pharmacist if you have questions. COMMON BRAND NAME(S): Lamisil, Terbinex What should I tell my care team before I take this medication? They need to know if you have any of these conditions: Liver disease An unusual or allergic reaction to terbinafine, other medications, foods, dyes, or preservatives Pregnant or trying to get pregnant Breast-feeding How should I use this medication? Take this medication by mouth with water. Take it as directed on the prescription label at the same time every day. You can take it with or without food. If it upsets your stomach, take it with food. Keep taking it unless your care team tells you to stop. A special MedGuide will be given to you by the pharmacist with each prescription and refill. Be sure to read this information carefully each time. Talk to your care team regarding the use of this medication in children. Special care may be needed. Overdosage: If you think you have taken too much of this medicine contact a poison control center or emergency room at once. NOTE: This medicine is only for you. Do not share this medicine with others. What if I miss a dose? If you miss a dose, take it as soon as you can unless it is more than 4 hours late. If it is more than 4 hours late, skip the missed dose. Take the next dose at the normal time. What may interact with this medication? Do not take this medication with any of the following: Pimozide Thioridazine This medication may also interact with the  following: Beta blockers Caffeine Certain medications for mental health conditions Cimetidine Cyclosporine Medications for fungal infections like fluconazole and ketoconazole Medications for irregular heartbeat like amiodarone, flecainide and propafenone Rifampin Warfarin This list may not describe all possible interactions. Give your health care provider a list of all the medicines, herbs, non-prescription drugs, or dietary supplements you use. Also tell them if you smoke, drink alcohol, or use illegal drugs. Some items may interact with your medicine. What should I watch for while using this medication? Visit your care team for regular checks on your progress. You may need blood work while you are taking this medication. It may be some time before you see the benefit from this medication. This medication may cause serious skin reactions. They can happen weeks to months after starting the medication. Contact your care team right away if you notice fevers or flu-like symptoms with a rash. The rash may be red or purple and then turn into blisters or peeling of the skin. Or, you might notice a red rash with swelling of the face, lips or lymph nodes in your neck or under your arms. This medication can make you more sensitive to the sun. Keep out of the sun, If you cannot avoid being in the sun, wear protective clothing and sunscreen. Do not use sun lamps or tanning beds/booths. What side effects may I notice from receiving this medication? Side effects that you should report to your care team as soon as possible: Allergic reactions--skin rash, itching, hives, swelling of the  face, lips, tongue, or throat Change in sense of smell Change in taste Infection--fever, chills, cough, or sore throat Liver injury--right upper belly pain, loss of appetite, nausea, light-colored stool, dark yellow or brown urine, yellowing skin or eyes, unusual weakness or fatigue Low red blood cell level--unusual weakness  or fatigue, dizziness, headache, trouble breathing Lupus-like syndrome--joint pain, swelling, or stiffness, butterfly-shaped rash on the face, rashes that get worse in the sun, fever, unusual weakness or fatigue Rash, fever, and swollen lymph nodes Redness, blistering, peeling, or loosening of the skin, including inside the mouth Unusual bruising or bleeding Worsening mood, feelings of depression Side effects that usually do not require medical attention (report to your care team if they continue or are bothersome): Diarrhea Gas Headache Nausea Stomach pain Upset stomach This list may not describe all possible side effects. Call your doctor for medical advice about side effects. You may report side effects to FDA at 1-800-FDA-1088. Where should I keep my medication? Keep out of the reach of children and pets. Store between 20 and 25 degrees C (68 and 77 degrees F). Protect from light. Get rid of any unused medication after the expiration date. To get rid of medications that are no longer needed or have expired: Take the medication to a medication take-back program. Check with your pharmacy or law enforcement to find a location. If you cannot return the medication, check the label or package insert to see if the medication should be thrown out in the garbage or flushed down the toilet. If you are not sure, ask your care team. If it is safe to put it in the trash, take the medication out of the container. Mix the medication with cat litter, dirt, coffee grounds, or other unwanted substance. Seal the mixture in a bag or container. Put it in the trash. NOTE: This sheet is a summary. It may not cover all possible information. If you have questions about this medicine, talk to your doctor, pharmacist, or health care provider.  2023 Elsevier/Gold Standard (2020-10-01 00:00:00)

## 2022-07-17 NOTE — Assessment & Plan Note (Signed)
Repeat lipid panel. ?Maintain crestor dose ?

## 2022-07-17 NOTE — Assessment & Plan Note (Signed)
Repeat hgbA1c 

## 2022-07-17 NOTE — Assessment & Plan Note (Signed)
Smokes 1/2ppd Use of nicotine patch, but admits he is not consistent. Starts he is not ready to quit, but thinking about it Trigger: ETOH consumption Advised about need to quit, adverse effects of tobacco use (CVD, PAD, cancer and COPD). He verbalized understanding

## 2022-07-17 NOTE — Patient Instructions (Signed)
Visit Information  Thank you for taking time to visit with me today. Please don't hesitate to contact me if I can be of assistance to you.   Following are the goals we discussed today:   Goals Addressed             This Visit's Progress    Care CoordinationActivities-No follow up required       Care Coordination Interventions: Advised patient to Annual Wellness exam. Discussed [facility] services and support. Assessed SDOH. Advised to discuss with primary care physician if services needed in the future.           If you are experiencing a Mental Health or Behavioral Health Crisis or need someone to talk to, please call the Suicide and Crisis Lifeline: 988   Patient verbalizes understanding of instructions and care plan provided today and agrees to view in MyChart. Active MyChart status and patient understanding of how to access instructions and care plan via MyChart confirmed with patient.     The patient has been provided with contact information for the care management team and has been advised to call with any health related questions or concerns.  No further follow up required: decline  Bary Leriche, RN, MSN [facility] Care Management Care Management Coordinator Direct Line [phone]

## 2022-07-17 NOTE — Assessment & Plan Note (Signed)
Improved BP with lisinopril and maxzide BP Readings from Last 3 Encounters:  07/17/22 130/80  06/05/22 (!) 186/97  06/03/22 (!) 149/87    CMP     Component Value Date/Time   NA 139 06/05/2022 0916   K 4.1 06/05/2022 0916   CL 107 06/05/2022 0916   CO2 27 06/05/2022 0916   GLUCOSE 108 (H) 06/05/2022 0916   BUN 14 06/05/2022 0916   CREATININE 1.00 06/05/2022 0916   CALCIUM 8.9 06/05/2022 0916   PROT 7.2 06/05/2022 0916   ALBUMIN 4.4 06/05/2022 0916   AST 18 06/05/2022 0916   ALT 16 06/05/2022 0916   ALKPHOS 50 06/05/2022 0916   BILITOT 0.4 06/05/2022 0916   GFRNONAA >60 06/05/2022 0916    Advised to quit tobacco use, maintain DASH diet and daily exercise Maintain med dose

## 2022-08-12 ENCOUNTER — Encounter: Payer: Self-pay | Admitting: Registered Nurse

## 2022-08-12 ENCOUNTER — Encounter: Payer: 59 | Attending: Physical Medicine & Rehabilitation | Admitting: Registered Nurse

## 2022-08-12 VITALS — BP 116/72 | HR 72 | Ht 68.0 in | Wt 174.8 lb

## 2022-08-12 DIAGNOSIS — M7062 Trochanteric bursitis, left hip: Secondary | ICD-10-CM | POA: Diagnosis not present

## 2022-08-12 DIAGNOSIS — Z5181 Encounter for therapeutic drug level monitoring: Secondary | ICD-10-CM | POA: Insufficient documentation

## 2022-08-12 DIAGNOSIS — G894 Chronic pain syndrome: Secondary | ICD-10-CM | POA: Insufficient documentation

## 2022-08-12 DIAGNOSIS — Z79891 Long term (current) use of opiate analgesic: Secondary | ICD-10-CM | POA: Diagnosis not present

## 2022-08-12 DIAGNOSIS — M47817 Spondylosis without myelopathy or radiculopathy, lumbosacral region: Secondary | ICD-10-CM | POA: Diagnosis not present

## 2022-08-12 MED ORDER — HYDROCODONE-ACETAMINOPHEN 10-325 MG PO TABS: 1.0000 | ORAL_TABLET | Freq: Two times a day (BID) | ORAL | 0 refills | Status: DC | PRN

## 2022-08-12 NOTE — Progress Notes (Signed)
Subjective:    Patient ID: Shaun Lang, male    DOB: [birth year], 66 y.o.   MRN: [id number]  HPI: Shaun Lang is a 67 y.o. male who returns for follow up appointment for chronic pain and medication refill. He states his  pain is located in his lower back, he also reports at times he has left lower extremity pain, his PCP is following. At this time he denies left lower extremity pain. He rates his pain 8. His current exercise regime is walking and performing stretching exercises.  Shaun Lang Morphine equivalent is 19.64 MME.   UDS ordered Today.    Pain Inventory Average Pain 9 Pain Right Now 8 My pain is burning and aching  In the last 24 hours, has pain interfered with the following? General activity 6 Relation with others 8 Enjoyment of life 6 What TIME of day is your pain at its worst? morning , evening, and night Sleep (in general) Poor  Pain is worse with: bending, inactivity, and standing Pain improves with: medication Relief from Meds: 7  Family History  Problem Relation Age of Onset   Diabetes Mother    Cancer Sister        breast   Diabetes Sister    Stroke Sister 74   Breast cancer Sister    Colon cancer Neg Hx    Esophageal cancer Neg Hx    Rectal cancer Neg Hx    Stomach cancer Neg Hx    Social History   Socioeconomic History   Marital status: Single    Spouse name: Not on file   Number of children: Not on file   Years of education: Not on file   Highest education level: Not on file  Occupational History    Comment: unemployed  Tobacco Use   Smoking status: Every Day    Packs/day: 0.50    Years: 33.00    Additional pack years: 0.00    Total pack years: 16.50    Types: Cigarettes   Smokeless tobacco: Never  Vaping Use   Vaping Use: Never used  Substance and Sexual Activity   Alcohol use: Yes    Alcohol/week: 2.0 standard drinks of alcohol    Types: 2 Cans of beer per week   Drug use: No   Sexual activity: Yes    Birth  control/protection: None  Other Topics Concern   Not on file  Social History Narrative   Not on file   Social Determinants of Health   Financial Resource Strain: Low Risk  (10/07/2021)   Overall Financial Resource Strain (CARDIA)    Difficulty of Paying Living Expenses: Not hard at all  Food Insecurity: No Food Insecurity (10/07/2021)   Hunger Vital Sign    Worried About Running Out of Food in the Last Year: Never true    Ran Out of Food in the Last Year: Never true  Transportation Needs: No Transportation Needs (07/17/2022)   PRAPARE - Administrator, Civil Service (Medical): No    Lack of Transportation (Non-Medical): No  Physical Activity: Inactive (10/07/2021)   Exercise Vital Sign    Days of Exercise per Week: 0 days    Minutes of Exercise per Session: 0 min  Stress: No Stress Concern Present (10/07/2021)   [employer] of Occupational Health - Occupational Stress Questionnaire    Feeling of Stress : Not at all  Social Connections: Socially Isolated (10/07/2021)   Social Connection and Isolation Panel [NHANES]  Frequency of Communication with Friends and Family: Three times a week    Frequency of Social Gatherings with Friends and Family: Three times a week    Attends Religious Services: Never    Active Member of Clubs or Organizations: No    Attends Banker Meetings: Never    Marital Status: Divorced   Past Surgical History:  Procedure Laterality Date   BUNIONECTOMY Left 07/15/2017   with big toe fracture repair   COLONOSCOPY  07/18/2012   polyps-Dr.Outlaw   HERNIA REPAIR  04/1998   [facility]   NECK SURGERY  07/21/02 and 01/2006   WRIST SURGERY  1992   right   Past Surgical History:  Procedure Laterality Date   BUNIONECTOMY Left 07/15/2017   with big toe fracture repair   COLONOSCOPY  07/18/2012   polyps-Dr.Outlaw   HERNIA REPAIR  04/1998   [facility]   NECK SURGERY  07/21/02 and 01/2006   WRIST SURGERY  1992   right   Past Medical History:   Diagnosis Date   Bell's palsy April 2010   Chest pain with low risk for cardiac etiology    Low Risk Myoview Sept 2013   DJD (degenerative joint disease)    disabilty secondary to neck issues   Hyperlipidemia    Hypertension    Sleep apnea    does not use CPAP   Smoker    BP 116/72   Pulse 72   Ht 5\' 8"  (1.727 m)   Wt 174 lb 12.8 oz (79.3 kg)   SpO2 98%   BMI 26.58 kg/m   Opioid Risk Score:   Fall Risk Score:  `1  Depression screen PHQ 2/9     07/17/2022    2:20 PM 06/03/2022   11:24 AM 04/02/2022   11:10 AM 02/05/2022   11:12 AM 12/10/2021    1:00 PM 10/07/2021   11:28 AM 10/07/2021   11:26 AM  Depression screen PHQ 2/9  Decreased Interest 0 0 0 0 0 0 0  Down, Depressed, Hopeless 0 0 0 0 0 0 0  PHQ - 2 Score 0 0 0 0 0 0 0     Review of Systems  Musculoskeletal:  Positive for back pain and neck pain.  Neurological:  Positive for weakness.  All other systems reviewed and are negative.     Objective:   Physical Exam Vitals and nursing note reviewed.  Constitutional:      Appearance: Normal appearance.  Cardiovascular:     Rate and Rhythm: Normal rate and regular rhythm.     Pulses: Normal pulses.     Heart sounds: Normal heart sounds.  Pulmonary:     Effort: Pulmonary effort is normal.     Breath sounds: Normal breath sounds.  Musculoskeletal:     Cervical back: Normal range of motion and neck supple.     Comments: Normal Muscle Bulk and Muscle Testing Reveals:  Upper Extremities: Full ROM and Muscle Strength 5/5 Left Greater Trochanter Tenderness Lower Extremities: Full ROM and Muscle Strength 5/5 Arises from Chair with ease Narrow Based  Gait     Skin:    General: Skin is warm and dry.  Neurological:     Mental Status: He is alert and oriented to person, place, and time.  Psychiatric:        Mood and Affect: Mood normal.        Behavior: Behavior normal.         Assessment & Plan:  1. Chronic left  inguinal pain: S/P inguinal hernia  (indirect) and mesh repair. Continue to Monitor. 05/22//2024. Refilled: Hydrocodone 10/325 mg one tablet  twice a day  as needed #55.Second script sent for the following month. We will continue the opioid monitoring program, this consists of regular clinic visits, examinations, urine drug screen, pill counts as well as use of [place] Controlled Substance Reporting system. A 12 month History has been reviewed on the [place] Controlled Substance Reporting System on 08/12/2022.  2. Lumbosacral Spondylosis: Continue with Home Exercise regime. Continue to Monitor. 08/12/2022 3. Muscle Spasm: Continue current medication regimen with Flexeril. Continue to monitor.08/12/2022  4.Chronic Right Shoulder Pain :No complaints today.  Chronic Pain Syndrome:  Continue HEP as tolerated.  Continue to Monitor. 08/12/2022.  5. Cervicalgia/ Cervical Radiculitis: No complaints today. Continue HEP as Tolerated. Alternate heat and Ice Therapy as tolerated. Continue current medication regimen. Continue to Monitor. 08/12/2022 6. Left Greater Trochanteric Tenderness: Alternate with Ice and Heat Therapy. Continue to Monitor. 08/12/2022 7. Left Shoulder Pain: No complaints today. Continue with HEP as tolerated and alternate with Ice  and Heat  Therapy. 08/12/2022   F/U in 2 months

## 2022-08-14 LAB — TOXASSURE SELECT,+ANTIDEPR,UR

## 2022-08-23 DIAGNOSIS — H43393 Other vitreous opacities, bilateral: Secondary | ICD-10-CM | POA: Diagnosis not present

## 2022-08-27 ENCOUNTER — Telehealth: Payer: Self-pay | Admitting: *Deleted

## 2022-08-27 NOTE — Telephone Encounter (Signed)
Urine drug screen for this encounter is consistent for prescribed medication 

## 2022-09-17 ENCOUNTER — Telehealth: Payer: Self-pay | Admitting: Nurse Practitioner

## 2022-09-17 NOTE — Telephone Encounter (Signed)
Called and made lab appointment

## 2022-09-17 NOTE — Telephone Encounter (Signed)
Pt needs his terbinafine (LAMISIL) 250 MG tablet [[id number]] refilled. He said it's time for the 60 day script. [facility] on [address]

## 2022-09-18 ENCOUNTER — Ambulatory Visit (INDEPENDENT_AMBULATORY_CARE_PROVIDER_SITE_OTHER): Payer: 59 | Admitting: *Deleted

## 2022-09-18 ENCOUNTER — Other Ambulatory Visit: Payer: 59

## 2022-09-18 VITALS — BP 130/84 | Wt 176.1 lb

## 2022-09-18 DIAGNOSIS — Z0000 Encounter for general adult medical examination without abnormal findings: Secondary | ICD-10-CM | POA: Diagnosis not present

## 2022-09-18 NOTE — Progress Notes (Signed)
Subjective:   Shaun Lang is a 67 y.o. male who presents for Medicare Annual/Subsequent preventive examination.    Patient Medicare AWV questionnaire was not completed by the patient.  I have confirmed that all information answered by patient is correct and no changes since this date.  Review of Systems    Defer to PCP Cardiac Risk Factors include: advanced age (>66men, >36 women);hypertension;male gender    Please see problem list for additional risk factors Objective:    Today's Vitals   09/18/22 1145  BP: 130/84  Weight: 176 lb 2 oz (79.9 kg)   Body mass index is 26.78 kg/m.     09/18/2022   11:50 AM 11/18/2021    7:44 AM 10/07/2021   11:27 AM 10/01/2020    3:04 PM 09/14/2019    1:38 PM 12/05/2016   10:08 PM 10/21/2016    8:46 AM  Advanced Directives  Does Patient Have a Medical Advance Directive? No No No No No No No  Would patient like information on creating a medical advance directive? No - Patient declined No - Patient declined No - Patient declined No - Patient declined Yes (ED - Information included in AVS) Yes (ED - Information included in AVS) Yes (MAU/Ambulatory/Procedural Areas - Information given)    Current Medications (verified) Outpatient Encounter Medications as of 09/18/2022  Medication Sig   cyclobenzaprine (FLEXERIL) 10 MG tablet TAKE 1 TABLET BY MOUTH TWICE DAILY AS NEEDED FOR MUSCLE SPASM   gabapentin (NEURONTIN) 300 MG capsule Take 1 capsule (300 mg total) by mouth 2 (two) times daily.   HYDROcodone-acetaminophen (NORCO) 10-325 MG tablet Take 1 tablet by mouth 2 (two) times daily as needed for moderate pain.   lisinopril (ZESTRIL) 5 MG tablet Take 1 tablet (5 mg total) by mouth daily.   Multiple Vitamin (MULTIVITAMIN WITH MINERALS) TABS tablet Take 1 tablet by mouth daily.   rosuvastatin (CRESTOR) 40 MG tablet Take 1 tablet (40 mg total) by mouth daily.   terbinafine (LAMISIL) 250 MG tablet Take 1 tablet (250 mg total) by mouth daily.    triamterene-hydrochlorothiazide (MAXZIDE-25) 37.5-25 MG tablet Take 1 tablet by mouth daily.   No facility-administered encounter medications on file as of 09/18/2022.    Allergies (verified) Patient has no known allergies.   History: Past Medical History:  Diagnosis Date   Bell's palsy April 2010   Chest pain with low risk for cardiac etiology    Low Risk Myoview Sept 2013   DJD (degenerative joint disease)    disabilty secondary to neck issues   Hyperlipidemia    Hypertension    Sleep apnea    does not use CPAP   Smoker    Past Surgical History:  Procedure Laterality Date   BUNIONECTOMY Left 07/15/2017   with big toe fracture repair   COLONOSCOPY  07/18/2012   polyps-Dr.Outlaw   HERNIA REPAIR  04/1998   [facility]   NECK SURGERY  07/21/02 and 01/2006   WRIST SURGERY  1992   right   Family History  Problem Relation Age of Onset   Diabetes Mother    Cancer Sister        breast   Diabetes Sister    Stroke Sister 83   Breast cancer Sister    Colon cancer Neg Hx    Esophageal cancer Neg Hx    Rectal cancer Neg Hx    Stomach cancer Neg Hx    Social History   Socioeconomic History   Marital status: Single  Spouse name: Not on file   Number of children: Not on file   Years of education: Not on file   Highest education level: Not on file  Occupational History    Comment: unemployed  Tobacco Use   Smoking status: Every Day    Packs/day: 0.50    Years: 33.00    Additional pack years: 0.00    Total pack years: 16.50    Types: Cigarettes   Smokeless tobacco: Never  Vaping Use   Vaping Use: Never used  Substance and Sexual Activity   Alcohol use: Yes    Alcohol/week: 2.0 standard drinks of alcohol    Types: 2 Cans of beer per week   Drug use: No   Sexual activity: Yes    Birth control/protection: None  Other Topics Concern   Not on file  Social History Narrative   Not on file   Social Determinants of Health   Financial Resource Strain: Low Risk   (09/18/2022)   Overall Financial Resource Strain (CARDIA)    Difficulty of Paying Living Expenses: Not hard at all  Food Insecurity: No Food Insecurity (09/18/2022)   Hunger Vital Sign    Worried About Running Out of Food in the Last Year: Never true    Ran Out of Food in the Last Year: Never true  Transportation Needs: No Transportation Needs (09/18/2022)   PRAPARE - Administrator, Civil Service (Medical): No    Lack of Transportation (Non-Medical): No  Physical Activity: Sufficiently Active (09/18/2022)   Exercise Vital Sign    Days of Exercise per Week: 7 days    Minutes of Exercise per Session: 40 min  Recent Concern: Physical Activity - Inactive (09/18/2022)   Exercise Vital Sign    Days of Exercise per Week: 0 days    Minutes of Exercise per Session: 0 min  Stress: No Stress Concern Present (09/18/2022)   [employer] of Occupational Health - Occupational Stress Questionnaire    Feeling of Stress : Not at all  Social Connections: Socially Isolated (10/07/2021)   Social Connection and Isolation Panel [NHANES]    Frequency of Communication with Friends and Family: Three times a week    Frequency of Social Gatherings with Friends and Family: Three times a week    Attends Religious Services: Never    Active Member of Clubs or Organizations: No    Attends Engineer, structural: Never    Marital Status: Divorced    Tobacco Counseling Ready to quit: Not Answered Counseling given: Not Answered   Clinical Intake:  Pre-visit preparation completed: Yes  Pain : No/denies pain     Nutritional Status: BMI 25 -29 Overweight Nutritional Risks: None Diabetes: No  How often do you need to have someone help you when you read instructions, pamphlets, or other written materials from your doctor or pharmacy?: 1 - Never What is the last grade level you completed in school?: 2 year collge  Interpreter Needed?: No      Activities of Daily Living    09/18/2022    11:57 AM 09/18/2022   11:56 AM  In your present state of health, do you have any difficulty performing the following activities:  Hearing?  0  Vision?  0  Difficulty concentrating or making decisions?  0  Walking or climbing stairs?  0  Dressing or bathing?  0  Doing errands, shopping?  0  Preparing Food and eating ? N   Using the Toilet? N   In  the past six months, have you accidently leaked urine? N   Do you have problems with loss of bowel control? N   Managing your Medications? N   Managing your Finances? N   Housekeeping or managing your Housekeeping? N     Patient Care Team: Nche, Bonna Gains, NP as PCP - General (Internal Medicine)  Indicate any recent Medical Services you may have received from other than [facility] providers in the past year (date may be approximate).     Assessment:   This is a routine wellness examination for [facility].  Hearing/Vision screen Vision Screening - Comments:: Annual eye exam, [facility]   Dietary issues and exercise activities discussed: Current Exercise Habits: Home exercise routine, Type of exercise: walking, Time (Minutes): 40, Frequency (Times/Week): 7, Weekly Exercise (Minutes/Week): 280, Intensity: Mild   Goals Addressed               This Visit's Progress     Patient Stated (pt-stated)        Exercise more, quit smoking      Depression Screen    09/18/2022   11:50 AM 07/17/2022    2:20 PM 06/03/2022   11:24 AM 04/02/2022   11:10 AM 02/05/2022   11:12 AM 12/10/2021    1:00 PM 10/07/2021   11:28 AM  PHQ 2/9 Scores  PHQ - 2 Score 0 0 0 0 0 0 0    Fall Risk    09/18/2022   11:50 AM 07/17/2022    1:30 PM 06/03/2022   11:24 AM 04/02/2022   11:10 AM 02/05/2022   11:12 AM  Fall Risk   Falls in the past year? 0 0 0 0 0  Number falls in past yr: 0 0 0 0   Injury with Fall? 0 0 0 0   Risk for fall due to : No Fall Risks No Fall Risks     Follow up Falls evaluation completed Falls evaluation completed       MEDICARE RISK  AT HOME:  Medicare Risk at Home - 09/18/22 1157     Any stairs in or around the home? Yes    If so, are there any without handrails? Yes    Home free of loose throw rugs in walkways, pet beds, electrical cords, etc? Yes    Adequate lighting in your home to reduce risk of falls? Yes    Life alert? No    Use of a cane, walker or w/c? No    Grab bars in the bathroom? No    Shower chair or bench in shower? No    Elevated toilet seat or a handicapped toilet? No             TIMED UP AND GO:  Was the test performed?  Yes  Length of time to ambulate 10 feet: 6 sec Gait steady and fast without use of assistive device    Cognitive Function:        09/18/2022   12:01 PM 09/14/2019    1:45 PM  6CIT Screen  What Year? 0 points 0 points  What month? 0 points 0 points  What time? 0 points 0 points  Count back from 20 0 points 0 points  Months in reverse 0 points 0 points  Repeat phrase 0 points 0 points  Total Score 0 points 0 points    Immunizations Immunization History  Administered Date(s) Administered   Moderna SARS-COV2 Booster Vaccination 05/22/2020   PFIZER(Purple Top)SARS-COV-2 Vaccination 05/28/2019, 06/25/2019  Tdap 07/17/2022    TDAP status: Up to date  Flu Vaccine status: Due, Education has been provided regarding the importance of this vaccine. Advised may receive this vaccine at local pharmacy or Health Dept. Aware to provide a copy of the vaccination record if obtained from local pharmacy or Health Dept. Verbalized acceptance and understanding.  Pneumococcal vaccine status: Declined,  Education has been provided regarding the importance of this vaccine but patient still declined. Advised may receive this vaccine at local pharmacy or Health Dept. Aware to provide a copy of the vaccination record if obtained from local pharmacy or Health Dept. Verbalized acceptance and understanding.   Covid-19 vaccine status: Completed vaccines  Qualifies for Shingles Vaccine?  Yes   Zostavax completed No   Shingrix Completed?: No.    Education has been provided regarding the importance of this vaccine. Patient has been advised to call insurance company to determine out of pocket expense if they have not yet received this vaccine. Advised may also receive vaccine at local pharmacy or Health Dept. Verbalized acceptance and understanding.  Screening Tests Health Maintenance  Topic Date Due   COVID-19 Vaccine (3 - Pfizer risk series) 06/19/2020   Zoster Vaccines- Shingrix (1 of 2) 10/16/2022 (Originally 05/19/1974)   Pneumonia Vaccine 23+ Years old (1 of 2 - PCV) 07/17/2023 (Originally 05/19/1961)   INFLUENZA VACCINE  10/22/2022   Medicare Annual Wellness (AWV)  09/18/2023   Colonoscopy  11/02/2027   DTaP/Tdap/Td (2 - Td or Tdap) 07/16/2032   Hepatitis C Screening  Completed   HPV VACCINES  Aged Out    Health Maintenance  Health Maintenance Due  Topic Date Due   COVID-19 Vaccine (3 - Pfizer risk series) 06/19/2020    Colorectal cancer screening: Type of screening: Colonoscopy. Completed 11/01/2017. Repeat every 10 years  Lung Cancer Screening: (Low Dose CT Chest recommended if Age 102-80 years, 20 pack-year currently smoking OR have quit w/in 15years.) does not qualify.   Lung Cancer Screening Referral: n/a  Additional Screening:  Hepatitis C Screening: does qualify; Completed 10/01/2021  Vision Screening: Recommended annual ophthalmology exams for early detection of glaucoma and other disorders of the eye. Is the patient up to date with their annual eye exam?  Yes  Who is the provider or what is the name of the office in which the patient attends annual eye exams? [facility] If pt is not established with a provider, would they like to be referred to a provider to establish care? No .   Dental Screening: Recommended annual dental exams for proper oral hygiene  Diabetic Foot Exam: Diabetic Foot Exam: Overdue, Pt has been advised about the importance in  completing this exam. Pt is scheduled for diabetic foot exam on 09/18/2022.  Community Resource Referral / Chronic Care Management: CRR required this visit?  No   CCM required this visit?  No     Plan:     I have personally reviewed and noted the following in the patient's chart:   Medical and social history Use of alcohol, tobacco or illicit drugs  Current medications and supplements including opioid prescriptions. Patient is currently taking opioid prescriptions. Information provided to patient regarding non-opioid alternatives. Patient advised to discuss non-opioid treatment plan with their provider. Functional ability and status Nutritional status Physical activity Advanced directives List of other physicians Hospitalizations, surgeries, and ER visits in previous 12 months Vitals Screenings to include cognitive, depression, and falls Referrals and appointments  In addition, I have reviewed and discussed with patient  certain preventive protocols, quality metrics, and best practice recommendations. A written personalized care plan for preventive services as well as general preventive health recommendations were provided to patient.     Tamela Oddi, CMA   09/18/2022   After Visit Summary: (MyChart) Due to this being a telephonic visit, the after visit summary with patients personalized plan was offered to patient via MyChart   Nurse Notes:    Mr. Persky , Thank you for taking time to come for your Medicare Wellness Visit. I appreciate your ongoing commitment to your health goals. Please review the following plan we discussed and let me know if I can assist you in the future.   These are the goals we discussed:  Goals       Care CoordinationActivities-No follow up required      Care Coordination Interventions: Advised patient to Annual Wellness exam. Discussed [facility] services and support. Assessed SDOH. Advised to discuss with primary care physician if services  needed in the future.        Exercise 3x per week (30 min per time)      Patient Stated      Would like to get back to gym       Patient Stated (pt-stated)      Exercise more, quit smoking      Quit Smoking        This is a list of the screening recommended for you and due dates:  Health Maintenance  Topic Date Due   COVID-19 Vaccine (3 - Pfizer risk series) 06/19/2020   Zoster (Shingles) Vaccine (1 of 2) 10/16/2022*   Pneumonia Vaccine (1 of 2 - PCV) 07/17/2023*   Flu Shot  10/22/2022   Medicare Annual Wellness Visit  09/18/2023   Colon Cancer Screening  11/02/2027   DTaP/Tdap/Td vaccine (2 - Td or Tdap) 07/16/2032   Hepatitis C Screening  Completed   HPV Vaccine  Aged Out  *Topic was postponed. The date shown is not the original due date.

## 2022-09-18 NOTE — Patient Instructions (Addendum)
[name] , Thank you for taking time to come for your Medicare Wellness Visit. I appreciate your ongoing commitment to your health goals. Please review the following plan we discussed and let me know if I can assist you in the future.   These are the goals we discussed:  Goals       Care CoordinationActivities-No follow up required      Care Coordination Interventions: Advised patient to Annual Wellness exam. Discussed [facility] services and support. Assessed SDOH. Advised to discuss with primary care physician if services needed in the future.        Exercise 3x per week (30 min per time)      Patient Stated      Would like to get back to gym       Patient Stated (pt-stated)      Exercise more, quit smoking      Quit Smoking        This is a list of the screening recommended for you and due dates:  Health Maintenance  Topic Date Due   COVID-19 Vaccine (3 - Pfizer risk series) 06/19/2020   Zoster (Shingles) Vaccine (1 of 2) 10/16/2022*   Pneumonia Vaccine (1 of 2 - PCV) 07/17/2023*   Flu Shot  10/22/2022   Medicare Annual Wellness Visit  09/18/2023   Colon Cancer Screening  11/02/2027   DTaP/Tdap/Td vaccine (2 - Td or Tdap) 07/16/2032   Hepatitis C Screening  Completed   HPV Vaccine  Aged Out  *Topic was postponed. The date shown is not the original due date.      Health Maintenance, Male Adopting a healthy lifestyle and getting preventive care are important in promoting health and wellness. Ask your health care provider about: The right schedule for you to have regular tests and exams. Things you can do on your own to prevent diseases and keep yourself healthy. What should I know about diet, weight, and exercise? Eat a healthy diet  Eat a diet that includes plenty of vegetables, fruits, low-fat dairy products, and lean protein. Do not eat a lot of foods that are high in solid fats, added sugars, or sodium. Maintain a healthy weight Body mass index (BMI) is a  measurement that can be used to identify possible weight problems. It estimates body fat based on height and weight. Your health care provider can help determine your BMI and help you achieve or maintain a healthy weight. Get regular exercise Get regular exercise. This is one of the most important things you can do for your health. Most adults should: Exercise for at least 150 minutes each week. The exercise should increase your heart rate and make you sweat (moderate-intensity exercise). Do strengthening exercises at least twice a week. This is in addition to the moderate-intensity exercise. Spend less time sitting. Even light physical activity can be beneficial. Watch cholesterol and blood lipids Have your blood tested for lipids and cholesterol at 67 years of age, then have this test every 5 years. You may need to have your cholesterol levels checked more often if: Your lipid or cholesterol levels are high. You are older than 67 years of age. You are at high risk for heart disease. What should I know about cancer screening? Many types of cancers can be detected early and may often be prevented. Depending on your health history and family history, you may need to have cancer screening at various ages. This may include screening for: Colorectal cancer. Prostate cancer. Skin cancer. Lung  cancer. What should I know about heart disease, diabetes, and high blood pressure? Blood pressure and heart disease High blood pressure causes heart disease and increases the risk of stroke. This is more likely to develop in people who have high blood pressure readings or are overweight. Talk with your health care provider about your target blood pressure readings. Have your blood pressure checked: Every 3-5 years if you are 62-33 years of age. Every year if you are 47 years old or older. If you are between the ages of 95 and 5 and are a current or former smoker, ask your health care provider if you should  have a one-time screening for abdominal aortic aneurysm (AAA). Diabetes Have regular diabetes screenings. This checks your fasting blood sugar level. Have the screening done: Once every three years after age 39 if you are at a normal weight and have a low risk for diabetes. More often and at a younger age if you are overweight or have a high risk for diabetes. What should I know about preventing infection? Hepatitis B If you have a higher risk for hepatitis B, you should be screened for this virus. Talk with your health care provider to find out if you are at risk for hepatitis B infection. Hepatitis C Blood testing is recommended for: Everyone born from [birth year] through 1965. Anyone with known risk factors for hepatitis C. Sexually transmitted infections (STIs) You should be screened each year for STIs, including gonorrhea and chlamydia, if: You are sexually active and are younger than 67 years of age. You are older than 67 years of age and your health care provider tells you that you are at risk for this type of infection. Your sexual activity has changed since you were last screened, and you are at increased risk for chlamydia or gonorrhea. Ask your health care provider if you are at risk. Ask your health care provider about whether you are at high risk for HIV. Your health care provider may recommend a prescription medicine to help prevent HIV infection. If you choose to take medicine to prevent HIV, you should first get tested for HIV. You should then be tested every 3 months for as long as you are taking the medicine. Follow these instructions at home: Alcohol use Do not drink alcohol if your health care provider tells you not to drink. If you drink alcohol: Limit how much you have to 0-2 drinks a day. Know how much alcohol is in your drink. In the [place] one drink equals one 12 oz bottle of beer (355 mL), one 5 oz glass of wine (148 mL), or one 1 oz glass of hard liquor (44  mL). Lifestyle Do not use any products that contain nicotine or tobacco. These products include cigarettes, chewing tobacco, and vaping devices, such as e-cigarettes. If you need help quitting, ask your health care provider. Do not use street drugs. Do not share needles. Ask your health care provider for help if you need support or information about quitting drugs. General instructions Schedule regular health, dental, and eye exams. Stay current with your vaccines. Tell your health care provider if: You often feel depressed. You have ever been abused or do not feel safe at home. Summary Adopting a healthy lifestyle and getting preventive care are important in promoting health and wellness. Follow your health care provider's instructions about healthy diet, exercising, and getting tested or screened for diseases. Follow your health care provider's instructions on monitoring your cholesterol and blood pressure. This  information is not intended to replace advice given to you by your health care provider. Make sure you discuss any questions you have with your health care provider. Document Revised: 07/29/2020 Document Reviewed: 07/29/2020 Elsevier Patient Education  2024 [employer].

## 2022-09-21 ENCOUNTER — Other Ambulatory Visit (INDEPENDENT_AMBULATORY_CARE_PROVIDER_SITE_OTHER): Payer: 59

## 2022-09-21 DIAGNOSIS — B351 Tinea unguium: Secondary | ICD-10-CM | POA: Diagnosis not present

## 2022-09-21 LAB — HEPATIC FUNCTION PANEL
ALT: 14 U/L (ref 0–53)
AST: 18 U/L (ref 0–37)
Albumin: 4.2 g/dL (ref 3.5–5.2)
Alkaline Phosphatase: 49 U/L (ref 39–117)
Bilirubin, Direct: 0.1 mg/dL (ref 0.0–0.3)
Total Bilirubin: 0.5 mg/dL (ref 0.2–1.2)
Total Protein: 6.7 g/dL (ref 6.0–8.3)

## 2022-10-13 ENCOUNTER — Encounter: Payer: 59 | Attending: Physical Medicine & Rehabilitation | Admitting: Registered Nurse

## 2022-10-13 VITALS — BP 108/69 | HR 72 | Ht 68.0 in | Wt 176.6 lb

## 2022-10-13 DIAGNOSIS — Z5181 Encounter for therapeutic drug level monitoring: Secondary | ICD-10-CM | POA: Insufficient documentation

## 2022-10-13 DIAGNOSIS — G894 Chronic pain syndrome: Secondary | ICD-10-CM | POA: Insufficient documentation

## 2022-10-13 DIAGNOSIS — Z79891 Long term (current) use of opiate analgesic: Secondary | ICD-10-CM | POA: Insufficient documentation

## 2022-10-13 DIAGNOSIS — M5416 Radiculopathy, lumbar region: Secondary | ICD-10-CM | POA: Insufficient documentation

## 2022-10-13 DIAGNOSIS — M47817 Spondylosis without myelopathy or radiculopathy, lumbosacral region: Secondary | ICD-10-CM | POA: Insufficient documentation

## 2022-10-13 DIAGNOSIS — M545 Low back pain, unspecified: Secondary | ICD-10-CM | POA: Diagnosis not present

## 2022-10-13 DIAGNOSIS — G8929 Other chronic pain: Secondary | ICD-10-CM | POA: Diagnosis not present

## 2022-10-13 MED ORDER — HYDROCODONE-ACETAMINOPHEN 10-325 MG PO TABS: 1.0000 | ORAL_TABLET | Freq: Two times a day (BID) | ORAL | 0 refills | Status: DC | PRN

## 2022-10-13 NOTE — Progress Notes (Unsigned)
Subjective:    Patient ID: Shaun Lang, male    DOB: 1955-10-20, 67 y.o.   MRN: [id number]  HPI: [name] is a 67 y.o. male who returns for follow up appointment for chronic pain and medication refill. states *** pain is located in  ***. rates pain ***. current exercise regime is walking and performing stretching exercises.  Mr. Amezcua Morphine equivalent is *** MME.   Last UDS was Performed on 08/12/2022, it was consistent.      Pain Inventory Average Pain 7 Pain Right Now 8 My pain is sharp, tingling, and aching  In the last 24 hours, has pain interfered with the following? General activity 6 Relation with others 8 Enjoyment of life 9 What TIME of day is your pain at its worst? morning  and night Sleep (in general) Poor  Pain is worse with: walking, bending, sitting, and inactivity Pain improves with: medication Relief from Meds: 8  Family History  Problem Relation Age of Onset   Diabetes Mother    Cancer Sister        breast   Diabetes Sister    Stroke Sister 79   Breast cancer Sister    Colon cancer Neg Hx    Esophageal cancer Neg Hx    Rectal cancer Neg Hx    Stomach cancer Neg Hx    Social History   Socioeconomic History   Marital status: Single    Spouse name: Not on file   Number of children: Not on file   Years of education: Not on file   Highest education level: Not on file  Occupational History    Comment: unemployed  Tobacco Use   Smoking status: Every Day    Current packs/day: 0.50    Average packs/day: 0.5 packs/day for 33.0 years (16.5 ttl pk-yrs)    Types: Cigarettes   Smokeless tobacco: Never  Vaping Use   Vaping status: Never Used  Substance and Sexual Activity   Alcohol use: Yes    Alcohol/week: 2.0 standard drinks of alcohol    Types: 2 Cans of beer per week   Drug use: No   Sexual activity: Yes    Birth control/protection: None  Other Topics Concern   Not on file  Social History Narrative   Not on file   Social  Determinants of Health   Financial Resource Strain: Low Risk  (09/18/2022)   Overall Financial Resource Strain (CARDIA)    Difficulty of Paying Living Expenses: Not hard at all  Food Insecurity: No Food Insecurity (09/18/2022)   Hunger Vital Sign    Worried About Running Out of Food in the Last Year: Never true    Ran Out of Food in the Last Year: Never true  Transportation Needs: No Transportation Needs (09/18/2022)   PRAPARE - Administrator, Civil Service (Medical): No    Lack of Transportation (Non-Medical): No  Physical Activity: Sufficiently Active (09/18/2022)   Exercise Vital Sign    Days of Exercise per Week: 7 days    Minutes of Exercise per Session: 40 min  Recent Concern: Physical Activity - Inactive (09/18/2022)   Exercise Vital Sign    Days of Exercise per Week: 0 days    Minutes of Exercise per Session: 0 min  Stress: No Stress Concern Present (09/18/2022)   [employer] of Occupational Health - Occupational Stress Questionnaire    Feeling of Stress : Not at all  Social Connections: Socially Isolated (10/07/2021)   Social  Connection and Isolation Panel [NHANES]    Frequency of Communication with Friends and Family: Three times a week    Frequency of Social Gatherings with Friends and Family: Three times a week    Attends Religious Services: Never    Active Member of Clubs or Organizations: No    Attends Banker Meetings: Never    Marital Status: Divorced   Past Surgical History:  Procedure Laterality Date   BUNIONECTOMY Left 07/15/2017   with big toe fracture repair   COLONOSCOPY  07/18/2012   polyps-Dr.Outlaw   HERNIA REPAIR  04/1998   [facility]   NECK SURGERY  07/21/02 and 01/2006   WRIST SURGERY  1992   right   Past Surgical History:  Procedure Laterality Date   BUNIONECTOMY Left 07/15/2017   with big toe fracture repair   COLONOSCOPY  07/18/2012   polyps-Dr.Outlaw   HERNIA REPAIR  04/1998   [facility]   NECK SURGERY  07/21/02 and 01/2006    WRIST SURGERY  1992   right   Past Medical History:  Diagnosis Date   Bell's palsy April 2010   Chest pain with low risk for cardiac etiology    Low Risk Myoview Sept 2013   DJD (degenerative joint disease)    disabilty secondary to neck issues   Hyperlipidemia    Hypertension    Sleep apnea    does not use CPAP   Smoker    BP 108/69   Pulse 72   Ht 5\' 8"  (1.727 m)   Wt 176 lb 9.6 oz (80.1 kg)   SpO2 97%   BMI 26.85 kg/m   Opioid Risk Score:   Fall Risk Score:  `1  Depression screen [facility] 2/9     09/18/2022   11:50 AM 07/17/2022    2:20 PM 06/03/2022   11:24 AM 04/02/2022   11:10 AM 02/05/2022   11:12 AM 12/10/2021    1:00 PM 10/07/2021   11:28 AM  Depression screen PHQ 2/9  Decreased Interest 0 0 0 0 0 0 0  Down, Depressed, Hopeless 0 0 0 0 0 0 0  PHQ - 2 Score 0 0 0 0 0 0 0    Review of Systems  Musculoskeletal:  Positive for back pain and neck pain.  All other systems reviewed and are negative.     Objective:   Physical Exam        Assessment & Plan:

## 2022-10-13 NOTE — Patient Instructions (Addendum)
Increase Gabapentin: to three times a day.  One capsule in the morning, one capsule late afternoon and one capsule at bedtime.   Send My-Chart message in week with update on medication change.  If you have any side effects call office immediately.

## 2022-10-14 ENCOUNTER — Encounter: Payer: Self-pay | Admitting: Registered Nurse

## 2022-10-21 ENCOUNTER — Ambulatory Visit: Admission: RE | Admit: 2022-10-21 | Payer: 59 | Source: Ambulatory Visit

## 2022-10-21 DIAGNOSIS — M545 Low back pain, unspecified: Secondary | ICD-10-CM | POA: Diagnosis not present

## 2022-11-12 ENCOUNTER — Telehealth: Payer: Self-pay | Admitting: Registered Nurse

## 2022-11-12 MED ORDER — GABAPENTIN 300 MG PO CAPS: 300.0000 mg | ORAL_CAPSULE | Freq: Two times a day (BID) | ORAL | 2 refills | Status: DC

## 2022-11-12 NOTE — Telephone Encounter (Signed)
Gabapentin prescription sent to pharmacy.  Mr. Schweitzer, is aware via My- Chart message.

## 2022-12-09 ENCOUNTER — Encounter: Payer: Self-pay | Admitting: Registered Nurse

## 2022-12-09 ENCOUNTER — Encounter: Payer: 59 | Attending: Physical Medicine & Rehabilitation | Admitting: Registered Nurse

## 2022-12-09 VITALS — BP 116/76 | HR 75 | Ht 68.0 in | Wt 175.0 lb

## 2022-12-09 DIAGNOSIS — M5416 Radiculopathy, lumbar region: Secondary | ICD-10-CM | POA: Diagnosis not present

## 2022-12-09 DIAGNOSIS — G894 Chronic pain syndrome: Secondary | ICD-10-CM

## 2022-12-09 DIAGNOSIS — Z5181 Encounter for therapeutic drug level monitoring: Secondary | ICD-10-CM

## 2022-12-09 DIAGNOSIS — Z79891 Long term (current) use of opiate analgesic: Secondary | ICD-10-CM

## 2022-12-09 DIAGNOSIS — M7062 Trochanteric bursitis, left hip: Secondary | ICD-10-CM | POA: Diagnosis not present

## 2022-12-09 MED ORDER — HYDROCODONE-ACETAMINOPHEN 10-325 MG PO TABS: 1.0000 | ORAL_TABLET | Freq: Two times a day (BID) | ORAL | 0 refills | Status: DC | PRN

## 2022-12-09 NOTE — Progress Notes (Signed)
Subjective:    Patient ID: Shaun Lang, male    DOB: [birth year], 67 y.o.   MRN: [id number]  HPI: Shaun Lang is a 67 y.o. male who returns for follow up appointment for chronic pain and medication refill. He states his  pain is located in his lower back radiating into his left hip and  left lower extremity. He rates his pain 8. His current exercise regime is walking and performing stretching exercises.  Shaun Lang Morphine equivalent is 18.33 MME.   Last UDS was Performed on 08/12/2022 it was consistent.      Pain Inventory Average Pain 8 Pain Right Now 8 My pain is constant, burning, and aching  In the last 24 hours, has pain interfered with the following? General activity 7 Relation with others 8 Enjoyment of life 5 What TIME of day is your pain at its worst? morning  and night Sleep (in general) Poor  Pain is worse with: walking, bending, inactivity, and some activites Pain improves with: medication Relief from Meds: 5  Family History  Problem Relation Age of Onset   Diabetes Mother    Cancer Sister        breast   Diabetes Sister    Stroke Sister 50   Breast cancer Sister    Colon cancer Neg Hx    Esophageal cancer Neg Hx    Rectal cancer Neg Hx    Stomach cancer Neg Hx    Social History   Socioeconomic History   Marital status: Single    Spouse name: Not on file   Number of children: Not on file   Years of education: Not on file   Highest education level: Not on file  Occupational History    Comment: unemployed  Tobacco Use   Smoking status: Every Day    Current packs/day: 0.50    Average packs/day: 0.5 packs/day for 33.0 years (16.5 ttl pk-yrs)    Types: Cigarettes   Smokeless tobacco: Never  Vaping Use   Vaping status: Never Used  Substance and Sexual Activity   Alcohol use: Yes    Alcohol/week: 2.0 standard drinks of alcohol    Types: 2 Cans of beer per week   Drug use: No   Sexual activity: Yes    Birth control/protection: None  Other  Topics Concern   Not on file  Social History Narrative   Not on file   Social Determinants of Health   Financial Resource Strain: Low Risk  (09/18/2022)   Overall Financial Resource Strain (CARDIA)    Difficulty of Paying Living Expenses: Not hard at all  Food Insecurity: No Food Insecurity (09/18/2022)   Hunger Vital Sign    Worried About Running Out of Food in the Last Year: Never true    Ran Out of Food in the Last Year: Never true  Transportation Needs: No Transportation Needs (09/18/2022)   PRAPARE - Administrator, Civil Service (Medical): No    Lack of Transportation (Non-Medical): No  Physical Activity: Sufficiently Active (09/18/2022)   Exercise Vital Sign    Days of Exercise per Week: 7 days    Minutes of Exercise per Session: 40 min  Recent Concern: Physical Activity - Inactive (09/18/2022)   Exercise Vital Sign    Days of Exercise per Week: 0 days    Minutes of Exercise per Session: 0 min  Stress: No Stress Concern Present (09/18/2022)   [employer] of Occupational Health - Occupational Stress Questionnaire  Feeling of Stress : Not at all  Social Connections: Socially Isolated (10/07/2021)   Social Connection and Isolation Panel [NHANES]    Frequency of Communication with Friends and Family: Three times a week    Frequency of Social Gatherings with Friends and Family: Three times a week    Attends Religious Services: Never    Active Member of Clubs or Organizations: No    Attends Banker Meetings: Never    Marital Status: Divorced   Past Surgical History:  Procedure Laterality Date   BUNIONECTOMY Left 07/15/2017   with big toe fracture repair   COLONOSCOPY  07/18/2012   polyps-Dr.Outlaw   HERNIA REPAIR  04/1998   [facility]   NECK SURGERY  07/21/02 and 01/2006   WRIST SURGERY  1992   right   Past Surgical History:  Procedure Laterality Date   BUNIONECTOMY Left 07/15/2017   with big toe fracture repair   COLONOSCOPY  07/18/2012    polyps-Dr.Outlaw   HERNIA REPAIR  04/1998   [facility]   NECK SURGERY  07/21/02 and 01/2006   WRIST SURGERY  1992   right   Past Medical History:  Diagnosis Date   Bell's palsy April 2010   Chest pain with low risk for cardiac etiology    Low Risk Myoview Sept 2013   DJD (degenerative joint disease)    disabilty secondary to neck issues   Hyperlipidemia    Hypertension    Sleep apnea    does not use CPAP   Smoker    There were no vitals taken for this visit.  Opioid Risk Score:   Fall Risk Score:  `1  Depression screen [facility] 2/9     09/18/2022   11:50 AM 07/17/2022    2:20 PM 06/03/2022   11:24 AM 04/02/2022   11:10 AM 02/05/2022   11:12 AM 12/10/2021    1:00 PM 10/07/2021   11:28 AM  Depression screen PHQ 2/9  Decreased Interest 0 0 0 0 0 0 0  Down, Depressed, Hopeless 0 0 0 0 0 0 0  PHQ - 2 Score 0 0 0 0 0 0 0    Review of Systems  Musculoskeletal:  Positive for back pain, gait problem and neck pain.       Pain across the shoulders, hips & left leg  All other systems reviewed and are negative.      Objective:   Physical Exam Vitals and nursing note reviewed.  Constitutional:      Appearance: Normal appearance.  Cardiovascular:     Rate and Rhythm: Normal rate and regular rhythm.     Pulses: Normal pulses.     Heart sounds: Normal heart sounds.  Pulmonary:     Effort: Pulmonary effort is normal.     Breath sounds: Normal breath sounds.  Musculoskeletal:     Cervical back: Normal range of motion and neck supple.     Comments: Normal Muscle Bulk and Muscle Testing Reveals:  Upper Extremities: Full ROM and Muscle Strength 5/5 Lumbar Paraspinal Tenderness: L-4-L-5 Lower Extremities: Full ROM and Muscle Strength 5/5 Arises from Chair slowly  Narrow Based  Gait     Skin:    General: Skin is warm and dry.  Neurological:     Mental Status: He is alert and oriented to person, place, and time.  Psychiatric:        Mood and Affect: Mood normal.        Behavior:  Behavior normal.  Assessment & Plan:  1. Chronic left inguinal pain: S/P inguinal hernia (indirect) and mesh repair. Continue to Monitor. 09/18//2024. Refilled: Hydrocodone 10/325 mg one tablet  twice a day  as needed #55.Second script sent for the following month. We will continue the opioid monitoring program, this consists of regular clinic visits, examinations, urine drug screen, pill counts as well as use of [place] Controlled Substance Reporting system. A 12 month History has been reviewed on the [place] Controlled Substance Reporting System on 12/09/2022.  2. Lumbosacral Spondylosis: Continue with Home Exercise regime. Continue to Monitor. 12/09/2022 3. Muscle Spasm: Continue current medication regimen with Flexeril. Continue to monitor.08/12/2022  4.Chronic Right Shoulder Pain :No complaints today.  Chronic Pain Syndrome:  Continue HEP as tolerated.  Continue to Monitor. 12/09/2022.  5. Cervicalgia/ Cervical Radiculitis: No complaints today. Continue HEP as Tolerated. Alternate heat and Ice Therapy as tolerated. Continue current medication regimen. Continue to Monitor. 12/09/2022 6. Left Greater Trochanteric Tenderness: Alternate with Ice and Heat Therapy. Continue to Monitor. 12/09/2022 7. Left Shoulder Pain: No complaints today. Continue with HEP as tolerated and alternate with Ice  and Heat  Therapy. 12/09/2022 8. Left Radiculitis: Increased Gabapentin to three times a day send a My chart message in two weeks with update on medication change. He verbalizes understanding.   F/U in 2 months

## 2022-12-09 NOTE — Patient Instructions (Signed)
Increase Gabapentin to three times a day   Morning  Late Afternoon and Bedtime   Send a message in a week with update on medication change

## 2023-01-19 ENCOUNTER — Encounter: Payer: Self-pay | Admitting: Nurse Practitioner

## 2023-01-19 ENCOUNTER — Ambulatory Visit (INDEPENDENT_AMBULATORY_CARE_PROVIDER_SITE_OTHER): Payer: 59 | Admitting: Nurse Practitioner

## 2023-01-19 VITALS — BP 126/78 | HR 68 | Temp 98.4°F | Resp 18 | Wt 173.2 lb

## 2023-01-19 DIAGNOSIS — E782 Mixed hyperlipidemia: Secondary | ICD-10-CM

## 2023-01-19 DIAGNOSIS — B351 Tinea unguium: Secondary | ICD-10-CM

## 2023-01-19 DIAGNOSIS — R739 Hyperglycemia, unspecified: Secondary | ICD-10-CM | POA: Diagnosis not present

## 2023-01-19 DIAGNOSIS — Z720 Tobacco use: Secondary | ICD-10-CM

## 2023-01-19 DIAGNOSIS — I10 Essential (primary) hypertension: Secondary | ICD-10-CM | POA: Diagnosis not present

## 2023-01-19 MED ORDER — TERBINAFINE HCL 250 MG PO TABS
250.0000 mg | ORAL_TABLET | Freq: Every day | ORAL | 2 refills | Status: DC
Start: 2023-01-19 — End: 2023-01-19

## 2023-01-19 MED ORDER — TRIAMTERENE-HCTZ 37.5-25 MG PO TABS: 1.0000 | ORAL_TABLET | Freq: Every day | ORAL | 1 refills | Status: DC

## 2023-01-19 MED ORDER — LISINOPRIL 5 MG PO TABS: 5.0000 mg | ORAL_TABLET | Freq: Every day | ORAL | 3 refills | Status: AC

## 2023-01-19 MED ORDER — TERBINAFINE HCL 250 MG PO TABS
250.0000 mg | ORAL_TABLET | Freq: Every day | ORAL | 1 refills | Status: DC
Start: 2023-01-19 — End: 2023-07-21

## 2023-01-19 MED ORDER — ROSUVASTATIN CALCIUM 40 MG PO TABS: 40.0000 mg | ORAL_TABLET | Freq: Every day | ORAL | 3 refills | Status: AC

## 2023-01-19 NOTE — Assessment & Plan Note (Signed)
Improving with lamisil x30days. Denies any adverse effect with med    Latest Ref Rng & Units 09/21/2022   11:30 AM 06/05/2022    9:16 AM 11/28/2021    3:15 PM  Hepatic Function  Total Protein 6.0 - 8.3 g/dL 6.7  7.2  6.7   Albumin 3.5 - 5.2 g/dL 4.2  4.4  4.1   AST 0 - 37 U/L 18  18  16    ALT 0 - 53 U/L 14  16  11    Alk Phosphatase 39 - 117 U/L 49  50  47   Total Bilirubin 0.2 - 1.2 mg/dL 0.5  0.4  0.4   Bilirubin, Direct 0.0 - 0.3 mg/dL 0.1       Refill sent x 2months

## 2023-01-19 NOTE — Assessment & Plan Note (Signed)
Quit 3weeks ago, but switched to vaping -1canister every 1-2weeks Plans to quit vaping in next 56month

## 2023-01-19 NOTE — Progress Notes (Signed)
Established Patient Visit  Patient: Shaun Lang   DOB: Jun 26, 1955   67 y.o. Male  MRN: [id number] Visit Date: 01/19/2023  Subjective:    Chief Complaint  Patient presents with   office visit     PT is here for 6 month follow up hypertension and hyperlipidemia. Patient is due to immunizations    HPI Current every day nicotine vapor product user Quit 3weeks ago, but switched to vaping -1canister every 1-2weeks Plans to quit vaping in next 38month  Hyperglycemia Repeat hgbA1c  Hyperlipidemia Repeat lipid panel No adverse effect with crestor   Onychomycosis Improving with lamisil x30days. Denies any adverse effect with med    Latest Ref Rng & Units 09/21/2022   11:30 AM 06/05/2022    9:16 AM 11/28/2021    3:15 PM  Hepatic Function  Total Protein 6.0 - 8.3 g/dL 6.7  7.2  6.7   Albumin 3.5 - 5.2 g/dL 4.2  4.4  4.1   AST 0 - 37 U/L 18  18  16    ALT 0 - 53 U/L 14  16  11    Alk Phosphatase 39 - 117 U/L 49  50  47   Total Bilirubin 0.2 - 1.2 mg/dL 0.5  0.4  0.4   Bilirubin, Direct 0.0 - 0.3 mg/dL 0.1       Refill sent x 2months  Reviewed medical, surgical, and social history today  Medications: Outpatient Medications Prior to Visit  Medication Sig Note   gabapentin (NEURONTIN) 300 MG capsule Take 1 capsule (300 mg total) by mouth 2 (two) times daily.    HYDROcodone-acetaminophen (NORCO) 10-325 MG tablet Take 1 tablet by mouth 2 (two) times daily as needed for moderate pain.    Multiple Vitamin (MULTIVITAMIN WITH MINERALS) TABS tablet Take 1 tablet by mouth daily.    [DISCONTINUED] lisinopril (ZESTRIL) 5 MG tablet Take 1 tablet (5 mg total) by mouth daily.    [DISCONTINUED] rosuvastatin (CRESTOR) 40 MG tablet Take 1 tablet (40 mg total) by mouth daily.    [DISCONTINUED] terbinafine (LAMISIL) 250 MG tablet Take 1 tablet (250 mg total) by mouth daily.    [DISCONTINUED] triamterene-hydrochlorothiazide (MAXZIDE-25) 37.5-25 MG tablet Take 1 tablet by mouth daily.     [DISCONTINUED] cyclobenzaprine (FLEXERIL) 10 MG tablet TAKE 1 TABLET BY MOUTH TWICE DAILY AS NEEDED FOR MUSCLE SPASM (Patient not taking: Reported on 01/19/2023) 01/19/2023: Refills needed    No facility-administered medications prior to visit.   Reviewed past medical and social history.   ROS per HPI above      Objective:  BP 126/78 (BP Location: Left Arm, Patient Position: Sitting, Cuff Size: Normal)   Pulse 68   Temp 98.4 F (36.9 C) (Temporal)   Resp 18   Wt 173 lb 3.2 oz (78.6 kg)   SpO2 100%   BMI 26.33 kg/m      Physical Exam Vitals and nursing note reviewed.  Cardiovascular:     Rate and Rhythm: Normal rate and regular rhythm.     Pulses: Normal pulses.     Heart sounds: Normal heart sounds.  Pulmonary:     Effort: Pulmonary effort is normal.     Breath sounds: Normal breath sounds.  Neurological:     Mental Status: He is alert and oriented to person, place, and time.     No results found for any visits on 01/19/23.    Assessment & Plan:  Problem List Items Addressed This Visit     Current every day nicotine vapor product user - Primary    Quit 3weeks ago, but switched to vaping -1canister every 1-2weeks Plans to quit vaping in next 78month      Hyperglycemia    Repeat hgbA1c      Relevant Orders   Hemoglobin A1c   Hyperlipidemia    Repeat lipid panel No adverse effect with crestor       Relevant Medications   triamterene-hydrochlorothiazide (MAXZIDE-25) 37.5-25 MG tablet   rosuvastatin (CRESTOR) 40 MG tablet   lisinopril (ZESTRIL) 5 MG tablet   Other Relevant Orders   Lipid panel   Hypertension   Relevant Medications   triamterene-hydrochlorothiazide (MAXZIDE-25) 37.5-25 MG tablet   rosuvastatin (CRESTOR) 40 MG tablet   lisinopril (ZESTRIL) 5 MG tablet   Other Relevant Orders   Basic metabolic panel   Onychomycosis    Improving with lamisil x30days. Denies any adverse effect with med    Latest Ref Rng & Units 09/21/2022   11:30  AM 06/05/2022    9:16 AM 11/28/2021    3:15 PM  Hepatic Function  Total Protein 6.0 - 8.3 g/dL 6.7  7.2  6.7   Albumin 3.5 - 5.2 g/dL 4.2  4.4  4.1   AST 0 - 37 U/L 18  18  16    ALT 0 - 53 U/L 14  16  11    Alk Phosphatase 39 - 117 U/L 49  50  47   Total Bilirubin 0.2 - 1.2 mg/dL 0.5  0.4  0.4   Bilirubin, Direct 0.0 - 0.3 mg/dL 0.1       Refill sent x 2months      Relevant Medications   terbinafine (LAMISIL) 250 MG tablet   Return in about 6 months (around 07/20/2023) for HTN, hyperlipidemia (fasting).     Alysia Penna, NP

## 2023-01-19 NOTE — Assessment & Plan Note (Signed)
Repeat lipid panel. No adverse effect with crestor

## 2023-01-19 NOTE — Assessment & Plan Note (Signed)
Repeat hgbA1c 

## 2023-01-19 NOTE — Patient Instructions (Signed)
Go to lab Continue Heart healthy diet and daily exercise. Maintain current medications. 

## 2023-01-20 ENCOUNTER — Other Ambulatory Visit (INDEPENDENT_AMBULATORY_CARE_PROVIDER_SITE_OTHER): Payer: 59

## 2023-01-20 DIAGNOSIS — R739 Hyperglycemia, unspecified: Secondary | ICD-10-CM

## 2023-01-20 DIAGNOSIS — E782 Mixed hyperlipidemia: Secondary | ICD-10-CM

## 2023-01-20 DIAGNOSIS — I10 Essential (primary) hypertension: Secondary | ICD-10-CM | POA: Diagnosis not present

## 2023-01-20 LAB — HEMOGLOBIN A1C: Hgb A1c MFr Bld: 6.3 % (ref 4.6–6.5)

## 2023-01-20 LAB — BASIC METABOLIC PANEL
BUN: 20 mg/dL (ref 6–23)
CO2: 28 meq/L (ref 19–32)
Calcium: 9.3 mg/dL (ref 8.4–10.5)
Chloride: 101 meq/L (ref 96–112)
Creatinine, Ser: 1.2 mg/dL (ref 0.40–1.50)
GFR: 62.5 mL/min (ref >60.00)
Glucose, Bld: 79 mg/dL (ref 70–99)
Potassium: 3.7 meq/L (ref 3.5–5.1)
Sodium: 136 meq/L (ref 135–145)

## 2023-01-20 LAB — LIPID PANEL
Cholesterol: 228 mg/dL — ABNORMAL HIGH (ref 0–200)
HDL: 61.3 mg/dL (ref >39.00)
LDL Cholesterol: 149 mg/dL — ABNORMAL HIGH (ref 0–99)
NonHDL: 166.38
Total CHOL/HDL Ratio: 4
Triglycerides: 86 mg/dL (ref 0.0–149.0)
VLDL: 17.2 mg/dL (ref 0.0–40.0)

## 2023-01-25 ENCOUNTER — Telehealth: Payer: Self-pay | Admitting: Nurse Practitioner

## 2023-01-25 DIAGNOSIS — Z0279 Encounter for issue of other medical certificate: Secondary | ICD-10-CM

## 2023-01-25 NOTE — Telephone Encounter (Signed)
Informed patient that his paperwork for Parking Placard card was filled out and is ready for pick up. Patient verbalized understanding and voiced he will be here tomorrow morning after work.

## 2023-01-25 NOTE — Telephone Encounter (Signed)
Patient dropped off document Handicap Placard, to be filled out by provider. Patient requested to send it back via Call Patient to pick up within 5-days. Document is located in providers tray at front office.Please advise at Mobile [phone] (mobile)   Pt dropped off paperwork to be filled out. I put in the dr box

## 2023-02-01 ENCOUNTER — Ambulatory Visit (HOSPITAL_COMMUNITY)
Admission: EM | Admit: 2023-02-01 | Discharge: 2023-02-01 | Disposition: A | Discharge status: Home / Self Care | Payer: 59 | Attending: Family Medicine | Admitting: Family Medicine

## 2023-02-01 ENCOUNTER — Encounter (HOSPITAL_COMMUNITY): Payer: Self-pay

## 2023-02-01 DIAGNOSIS — N342 Other urethritis: Secondary | ICD-10-CM | POA: Diagnosis not present

## 2023-02-01 MED ORDER — CEFTRIAXONE SODIUM 500 MG IJ SOLR
500.0000 mg | INTRAMUSCULAR | Status: DC
Start: 2023-02-01 — End: 2023-02-01
  Administered 2023-02-01: 500 mg via INTRAMUSCULAR

## 2023-02-01 MED ORDER — DOXYCYCLINE HYCLATE 100 MG PO CAPS: 100.0000 mg | ORAL_CAPSULE | Freq: Two times a day (BID) | ORAL | 0 refills | Status: AC

## 2023-02-01 MED ORDER — CEFTRIAXONE SODIUM 500 MG IJ SOLR
INTRAMUSCULAR | Status: AC
  Filled 2023-02-01: qty 500

## 2023-02-01 NOTE — Discharge Instructions (Signed)
You have been given a shot of ceftriaxone 500 mg; this is for potential gonorrhea infection  Take doxycycline 100 mg --1 capsule 2 times daily for 7 days; this is for potential chlamydia infection.  Staff will notify you if there is anything positive on the swab.

## 2023-02-01 NOTE — ED Triage Notes (Signed)
Pt presents to the office for milky white penile discharge x 3 days. Pt would like STD testing.

## 2023-02-01 NOTE — ED Provider Notes (Signed)
[facility]    CSN: [id number] Arrival date & time: 02/01/23  1127      History   Chief Complaint Chief Complaint  Patient presents with   Penile Discharge    HPI Shaun Lang is a 67 y.o. male.    Penile Discharge  Here for penile discharge and a little bit of dysuria, symptoms started 2 to 3 days ago.  No fever or vomiting  No allergies to medications  He has not been exposed to any particular infection that he knows of  Past Medical History:  Diagnosis Date   Bell's palsy 06/2008   Chest pain with low risk for cardiac etiology    Low Risk Myoview Sept 2013   DJD (degenerative joint disease)    disabilty secondary to neck issues   Excessive daytime sleepiness 09/11/2021   Hyperlipidemia    Hypertension    Sleep apnea    does not use CPAP   Smoker     Patient Active Problem List   Diagnosis Date Noted   Onychomycosis 07/17/2022   Hyperglycemia 01/18/2022   Leucopenia 10/02/2021   Persistent circadian rhythm sleep disorder, shift work type 09/11/2021   Other male erectile dysfunction 07/14/2021   Encounter for screening for lung cancer 10/18/2019   Chronic pain syndrome 09/29/2017   Lumbosacral spondylosis without myelopathy 09/29/2017   OSA (obstructive sleep apnea) 06/21/2017   Patient takes muscle relaxants 02/17/2017   Myofascial pain 02/16/2017   Insomnia 10/14/2016   Rotator cuff syndrome of left shoulder 01/30/2015   Arthritis of left acromioclavicular joint 01/30/2015   Knee pain, bilateral 06/05/2014   Right rotator cuff tendinitis 07/07/2013   Chest pain with low risk for cardiac etiology    Hypertension    Hyperlipidemia    DJD (degenerative joint disease)    Current every day nicotine vapor product user    Ilioinguinal neuralgia 06/01/2012   Left inguinal pain 12/23/2011   Bell's palsy 06/21/2008    Past Surgical History:  Procedure Laterality Date   BUNIONECTOMY Left 07/15/2017   with big toe fracture repair    COLONOSCOPY  07/18/2012   polyps-Dr.Outlaw   HERNIA REPAIR  04/1998   [facility]   NECK SURGERY  07/21/02 and 01/2006   WRIST SURGERY  1992   right       Home Medications    Prior to Admission medications   Medication Sig Start Date End Date Taking? Authorizing Provider  doxycycline (VIBRAMYCIN) 100 MG capsule Take 1 capsule (100 mg total) by mouth 2 (two) times daily for 7 days. 02/01/23 02/08/23 Yes Zenia Resides, MD  gabapentin (NEURONTIN) 300 MG capsule Take 1 capsule (300 mg total) by mouth 2 (two) times daily. 11/12/22  Yes Jones Bales, NP  HYDROcodone-acetaminophen (NORCO) 10-325 MG tablet Take 1 tablet by mouth 2 (two) times daily as needed for moderate pain. 12/09/22  Yes Jones Bales, NP  lisinopril (ZESTRIL) 5 MG tablet Take 1 tablet (5 mg total) by mouth daily. 01/19/23  Yes Nche, Bonna Gains, NP  rosuvastatin (CRESTOR) 40 MG tablet Take 1 tablet (40 mg total) by mouth daily. 01/19/23  Yes Nche, Bonna Gains, NP  terbinafine (LAMISIL) 250 MG tablet Take 1 tablet (250 mg total) by mouth daily. 01/19/23  Yes Nche, Bonna Gains, NP  triamterene-hydrochlorothiazide (MAXZIDE-25) 37.5-25 MG tablet Take 1 tablet by mouth daily. 01/19/23   Nche, Bonna Gains, NP    Family History Family History  Problem Relation Age of Onset   Diabetes  Mother    Cancer Sister        breast   Diabetes Sister    Stroke Sister 24   Breast cancer Sister    Colon cancer Neg Hx    Esophageal cancer Neg Hx    Rectal cancer Neg Hx    Stomach cancer Neg Hx     Social History Social History   Tobacco Use   Smoking status: Former    Average packs/day: 1 pack/day for 57.8 years (57.8 ttl pk-yrs)    Types: Cigarettes    Start date: 03/24/1965   Smokeless tobacco: Never  Vaping Use   Vaping status: Every Day   Substances: Nicotine, Flavoring  Substance Use Topics   Alcohol use: Not Currently   Drug use: No     Allergies   Patient has no known allergies.   Review of  Systems Review of Systems  Genitourinary:  Positive for penile discharge.     Physical Exam Triage Vital Signs ED Triage Vitals [02/01/23 1253]  Encounter Vitals Group     BP 130/72     Systolic BP Percentile      Diastolic BP Percentile      Pulse Rate 65     Resp 16     Temp 98.3 F (36.8 C)     Temp Source Oral     SpO2 98 %     Weight      Height      Head Circumference      Peak Flow      Pain Score      Pain Loc      Pain Education      Exclude from Growth Chart    No data found.  Updated Vital Signs BP 130/72 (BP Location: Left Arm)   Pulse 65   Temp 98.3 F (36.8 C) (Oral)   Resp 16   SpO2 98%   Visual Acuity Right Eye Distance:   Left Eye Distance:   Bilateral Distance:    Right Eye Near:   Left Eye Near:    Bilateral Near:     Physical Exam Vitals reviewed.  Constitutional:      General: He is not in acute distress.    Appearance: He is not toxic-appearing.  HENT:     Mouth/Throat:     Mouth: Mucous membranes are moist.  Cardiovascular:     Rate and Rhythm: Normal rate and regular rhythm.  Pulmonary:     Effort: Pulmonary effort is normal.     Breath sounds: Normal breath sounds.  Skin:    Coloration: Skin is not pale.  Neurological:     Mental Status: He is alert and oriented to person, place, and time.  Psychiatric:        Behavior: Behavior normal.      UC Treatments / Results  Labs (all labs ordered are listed, but only abnormal results are displayed) Labs Reviewed  CYTOLOGY, (ORAL, ANAL, URETHRAL) ANCILLARY ONLY    EKG   Radiology No results found.  Procedures Procedures (including critical care time)  Medications Ordered in UC Medications  cefTRIAXone (ROCEPHIN) injection 500 mg (has no administration in time range)    Initial Impression / Assessment and Plan / UC Course  I have reviewed the triage vital signs and the nursing notes.  Pertinent labs & imaging results that were available during my care of the  patient were reviewed by me and considered in my medical decision making (see chart for details).  Urethral self swab is done and staff will notify him of any positives and treat per protocol. He declines my offer of HIV and RPR testing.  He would like empiric treatment, so ceftriaxone is given an injection and doxycycline is sent to the pharmacy.6 Final Clinical Impressions(s) / UC Diagnoses   Final diagnoses:  Urethritis     Discharge Instructions      You have been given a shot of ceftriaxone 500 mg; this is for potential gonorrhea infection  Take doxycycline 100 mg --1 capsule 2 times daily for 7 days; this is for potential chlamydia infection.  Staff will notify you if there is anything positive on the swab.      ED Prescriptions     Medication Sig Dispense Auth. Provider   doxycycline (VIBRAMYCIN) 100 MG capsule Take 1 capsule (100 mg total) by mouth 2 (two) times daily for 7 days. 14 capsule Marlinda Mike, Janace Aris, MD      PDMP not reviewed this encounter.   Zenia Resides, MD 02/01/23 [phone]

## 2023-02-02 LAB — CYTOLOGY, (ORAL, ANAL, URETHRAL) ANCILLARY ONLY
Chlamydia: NEGATIVE
Comment: NEGATIVE
Comment: NEGATIVE
Comment: NORMAL
Neisseria Gonorrhea: POSITIVE — AB
Trichomonas: NEGATIVE

## 2023-02-02 NOTE — Progress Notes (Unsigned)
Subjective:    Patient ID: Shaun Lang, male    DOB: 19-Mar-1956, 67 y.o.   MRN: [id number]  HPI: Shaun Lang is a 67 y.o. male who returns for follow up appointment for chronic pain and medication refill. He states his pain is located in his neck, right elbow and lower back pain radiating into his left lower extremity. [name] He rates is pain 8. His current exercise regime is walking and performing stretching exercises.  Mr. Hege was seen in Urgent Care on 02/01/2023, for penile discharge. Note was reviewed. He reports he is taking his antibiotics as prescribed.   Mr. Creque reports 3- 4 weeks ago he was carrying a bin upstairs, he reports the bin had wheels on it. He reached for the rail and missed the rail and he fell on his right side down 4 stairs. He was able to pick himself up, he didn't seek medical attention. Educated on falls prevention, he verbalizes understanding.   Mr. Melchiorre Morphine equivalent is 19.64 MME.   UDS ordered today.      Pain Inventory Average Pain 8 Pain Right Now 8 My pain is tingling and aching  In the last 24 hours, has pain interfered with the following? General activity 7 Relation with others 8 Enjoyment of life 7 What TIME of day is your pain at its worst? morning , evening, and night Sleep (in general) Poor  Pain is worse with: inactivity, standing, and some activites Pain improves with: heat/ice, pacing activities, and medication Relief from Meds: 6  Family History  Problem Relation Age of Onset   Diabetes Mother    Cancer Sister        breast   Diabetes Sister    Stroke Sister 42   Breast cancer Sister    Colon cancer Neg Hx    Esophageal cancer Neg Hx    Rectal cancer Neg Hx    Stomach cancer Neg Hx    Social History   Socioeconomic History   Marital status: Single    Spouse name: Not on file   Number of children: Not on file   Years of education: Not on file   Highest education level: Not on file  Occupational History     Comment: unemployed  Tobacco Use   Smoking status: Former    Average packs/day: 1 pack/day for 57.8 years (57.8 ttl pk-yrs)    Types: Cigarettes    Start date: 03/24/1965   Smokeless tobacco: Never  Vaping Use   Vaping status: Every Day   Substances: Nicotine, Flavoring  Substance and Sexual Activity   Alcohol use: Not Currently   Drug use: No   Sexual activity: Yes    Birth control/protection: None  Other Topics Concern   Not on file  Social History Narrative   Not on file   Social Determinants of Health   Financial Resource Strain: Low Risk  (09/18/2022)   Overall Financial Resource Strain (CARDIA)    Difficulty of Paying Living Expenses: Not hard at all  Food Insecurity: No Food Insecurity (09/18/2022)   Hunger Vital Sign    Worried About Running Out of Food in the Last Year: Never true    Ran Out of Food in the Last Year: Never true  Transportation Needs: No Transportation Needs (09/18/2022)   PRAPARE - Administrator, Civil Service (Medical): No    Lack of Transportation (Non-Medical): No  Physical Activity: Sufficiently Active (09/18/2022)   Exercise Vital Sign  Days of Exercise per Week: 7 days    Minutes of Exercise per Session: 40 min  Recent Concern: Physical Activity - Inactive (09/18/2022)   Exercise Vital Sign    Days of Exercise per Week: 0 days    Minutes of Exercise per Session: 0 min  Stress: No Stress Concern Present (09/18/2022)   [employer] of Occupational Health - Occupational Stress Questionnaire    Feeling of Stress : Not at all  Social Connections: Socially Isolated (10/07/2021)   Social Connection and Isolation Panel [NHANES]    Frequency of Communication with Friends and Family: Three times a week    Frequency of Social Gatherings with Friends and Family: Three times a week    Attends Religious Services: Never    Active Member of Clubs or Organizations: No    Attends Banker Meetings: Never    Marital Status:  Divorced   Past Surgical History:  Procedure Laterality Date   BUNIONECTOMY Left 07/15/2017   with big toe fracture repair   COLONOSCOPY  07/18/2012   polyps-Dr.Outlaw   HERNIA REPAIR  04/1998   [facility]   NECK SURGERY  07/21/02 and 01/2006   WRIST SURGERY  1992   right   Past Surgical History:  Procedure Laterality Date   BUNIONECTOMY Left 07/15/2017   with big toe fracture repair   COLONOSCOPY  07/18/2012   polyps-Dr.Outlaw   HERNIA REPAIR  04/1998   [facility]   NECK SURGERY  07/21/02 and 01/2006   WRIST SURGERY  1992   right   Past Medical History:  Diagnosis Date   Bell's palsy 06/2008   Chest pain with low risk for cardiac etiology    Low Risk Myoview Sept 2013   DJD (degenerative joint disease)    disabilty secondary to neck issues   Excessive daytime sleepiness 09/11/2021   Hyperlipidemia    Hypertension    Sleep apnea    does not use CPAP   Smoker    There were no vitals taken for this visit.  Opioid Risk Score:   Fall Risk Score:  `1  Depression screen [facility] 2/9     01/19/2023   11:40 AM 12/09/2022   11:39 AM 09/18/2022   11:50 AM 07/17/2022    2:20 PM 06/03/2022   11:24 AM 04/02/2022   11:10 AM 02/05/2022   11:12 AM  Depression screen PHQ 2/9  Decreased Interest 0 0 0 0 0 0 0  Down, Depressed, Hopeless 0 0 0 0 0 0 0  PHQ - 2 Score 0 0 0 0 0 0 0    Review of Systems  Musculoskeletal:  Positive for back pain.       Right elbow pain Left shoulder pain   All other systems reviewed and are negative.     Objective:   Physical Exam Vitals and nursing note reviewed.  Constitutional:      Appearance: Normal appearance.  Cardiovascular:     Rate and Rhythm: Normal rate and regular rhythm.     Pulses: Normal pulses.     Heart sounds: Normal heart sounds.  Pulmonary:     Effort: Pulmonary effort is normal.     Breath sounds: Normal breath sounds.  Musculoskeletal:     Comments: Normal Muscle Bulk and Muscle Testing Reveals:  Upper Extremities: Full ROM and  Muscle Strength  5/5 Right Elbow: No swelling or tenderness noted.  Left AC Joint Tenderness Lumbar Paraspinal Tenderness: L-4-L-5 Lower Extremities: Full ROM and Muscle Strength 5/5 Arises  from chair with ease Narrow Based  Gait     Skin:    General: Skin is warm and dry.  Neurological:     Mental Status: He is alert and oriented to person, place, and time.  Psychiatric:        Mood and Affect: Mood normal.        Behavior: Behavior normal.           Assessment & Plan:  1. Cervicalgia: S/P Fall: RX: Cervical X-ray. Continue to Monitor.  2. Right Elbow Pain: RX: X-ray. Continue to Monitor.  3.Chronic left inguinal pain: S/P inguinal hernia (indirect) and mesh repair. Continue to Monitor. 11/13//2024. Refilled: Hydrocodone 10/325 mg one tablet  twice a day  as needed #55.Second script sent for the following month. We will continue the opioid monitoring program, this consists of regular clinic visits, examinations, urine drug screen, pill counts as well as use of [place] Controlled Substance Reporting system. A 12 month History has been reviewed on the [place] Controlled Substance Reporting System on 02/03/2023.  4. Lumbosacral Spondylosis: Continue with Home Exercise regime. Continue to Monitor. 02/03/2023 5. Muscle Spasm: Continue current medication regimen with Flexeril. Continue to monitor.02/03/2023 6..Chronic Right Shoulder Pain :No complaints today.  Chronic Pain Syndrome:  Continue HEP as tolerated.  Continue to Monitor. 02/03/2023.  7. Cervicalgia/ Cervical Radiculitis:  Continue HEP as Tolerated. Alternate heat and Ice Therapy as tolerated. Continue current medication regimen. Continue to Monitor. 02/03/2023 8. Left Greater Trochanteric Tenderness: Alternate with Ice and Heat Therapy. Continue to Monitor. 02/03/2023 9. Left Shoulder Pain: No complaints today. Continue with HEP as tolerated and alternate with Ice  and Heat  Therapy. 02/03/2023 10. Left  Radiculitis: Continue  Gabapentin  one capsule twice a day. Continue to Monitor.  11. Falls subsequent encounter: Educated on falls prevention, he verbalizes understanding.    F/U in 2 months

## 2023-02-03 ENCOUNTER — Encounter: Payer: 59 | Attending: Physical Medicine & Rehabilitation | Admitting: Registered Nurse

## 2023-02-03 ENCOUNTER — Encounter: Payer: Self-pay | Admitting: Registered Nurse

## 2023-02-03 VITALS — BP 131/83 | HR 68 | Ht 68.0 in | Wt 171.0 lb

## 2023-02-03 DIAGNOSIS — M5416 Radiculopathy, lumbar region: Secondary | ICD-10-CM | POA: Diagnosis not present

## 2023-02-03 DIAGNOSIS — M542 Cervicalgia: Secondary | ICD-10-CM | POA: Insufficient documentation

## 2023-02-03 DIAGNOSIS — G894 Chronic pain syndrome: Secondary | ICD-10-CM | POA: Insufficient documentation

## 2023-02-03 DIAGNOSIS — Z79891 Long term (current) use of opiate analgesic: Secondary | ICD-10-CM | POA: Insufficient documentation

## 2023-02-03 DIAGNOSIS — W19XXXD Unspecified fall, subsequent encounter: Secondary | ICD-10-CM | POA: Insufficient documentation

## 2023-02-03 DIAGNOSIS — M25521 Pain in right elbow: Secondary | ICD-10-CM | POA: Diagnosis not present

## 2023-02-03 DIAGNOSIS — Z5181 Encounter for therapeutic drug level monitoring: Secondary | ICD-10-CM | POA: Insufficient documentation

## 2023-02-03 DIAGNOSIS — M7062 Trochanteric bursitis, left hip: Secondary | ICD-10-CM | POA: Diagnosis not present

## 2023-02-03 MED ORDER — HYDROCODONE-ACETAMINOPHEN 10-325 MG PO TABS: 1.0000 | ORAL_TABLET | Freq: Two times a day (BID) | ORAL | 0 refills | Status: DC | PRN

## 2023-02-05 ENCOUNTER — Ambulatory Visit
Admission: RE | Admit: 2023-02-05 | Discharge: 2023-02-05 | Disposition: A | Discharge status: Home / Self Care | Payer: 59 | Source: Ambulatory Visit | Attending: Registered Nurse | Admitting: Registered Nurse

## 2023-02-05 DIAGNOSIS — M25521 Pain in right elbow: Secondary | ICD-10-CM | POA: Diagnosis not present

## 2023-02-07 LAB — TOXASSURE SELECT,+ANTIDEPR,UR

## 2023-02-25 ENCOUNTER — Telehealth: Payer: Self-pay | Admitting: Registered Nurse

## 2023-02-25 NOTE — Telephone Encounter (Signed)
Mr. Zuehlke was called, we discussed his X-ray results.  He states he will go to [facility], still having pain in his right elbow, denies physical therapy at this time.  He will send a My- Chart message with update, after seeing Ortho. He verbalizes understanding.

## 2023-03-01 DIAGNOSIS — M25521 Pain in right elbow: Secondary | ICD-10-CM | POA: Diagnosis not present

## 2023-03-23 ENCOUNTER — Telehealth: Payer: Self-pay | Admitting: *Deleted

## 2023-03-23 NOTE — Telephone Encounter (Signed)
Urine drug screen is positive for alcohol as well as his prescribed narcotic He has been sent a final warning letter for combining narcotics with alcohol through his MyCHart.

## 2023-03-31 ENCOUNTER — Encounter: Payer: Self-pay | Admitting: Registered Nurse

## 2023-03-31 ENCOUNTER — Encounter: Payer: 59 | Attending: Physical Medicine & Rehabilitation | Admitting: Registered Nurse

## 2023-03-31 VITALS — BP 126/83 | HR 68 | Ht 68.0 in | Wt 176.0 lb

## 2023-03-31 DIAGNOSIS — Z79891 Long term (current) use of opiate analgesic: Secondary | ICD-10-CM | POA: Insufficient documentation

## 2023-03-31 DIAGNOSIS — M25521 Pain in right elbow: Secondary | ICD-10-CM | POA: Diagnosis not present

## 2023-03-31 DIAGNOSIS — M5416 Radiculopathy, lumbar region: Secondary | ICD-10-CM | POA: Insufficient documentation

## 2023-03-31 DIAGNOSIS — Z5181 Encounter for therapeutic drug level monitoring: Secondary | ICD-10-CM | POA: Insufficient documentation

## 2023-03-31 DIAGNOSIS — G894 Chronic pain syndrome: Secondary | ICD-10-CM | POA: Insufficient documentation

## 2023-03-31 DIAGNOSIS — M542 Cervicalgia: Secondary | ICD-10-CM | POA: Insufficient documentation

## 2023-03-31 MED ORDER — HYDROCODONE-ACETAMINOPHEN 10-325 MG PO TABS: 1.0000 | ORAL_TABLET | Freq: Two times a day (BID) | ORAL | 0 refills | Status: DC | PRN

## 2023-03-31 NOTE — Progress Notes (Signed)
 Subjective:    Patient ID: Shaun Lang, male    DOB: [birth year], 68 y.o.   MRN: [id number]  HPI: Shaun Lang is a 68 y.o. male who returns for follow up appointment for chronic pain and medication refill. He states his pain is located in his neck, right elbow pain Ortho following . He rates his pain 7. His current exercise regime is walking and performing stretching exercises.  Mr. Denz Morphine equivalent is 19.64 MME.   Last UDS was Performed on 02/03/2023, it was  + ETOH, results was reviewed, he received a warning letter. Educated on the Narcotic Policy, he verbalizes understanding.      Pain Inventory Average Pain 7 Pain Right Now 7 My pain is intermittent  In the last 24 hours, has pain interfered with the following? General activity 7 Relation with others 8 Enjoyment of life 7 What TIME of day is your pain at its worst? varies Sleep (in general) Poor  Pain is worse with: some activites Pain improves with: medication Relief from Meds: 6  Family History  Problem Relation Age of Onset   Diabetes Mother    Cancer Sister        breast   Diabetes Sister    Stroke Sister 42   Breast cancer Sister    Colon cancer Neg Hx    Esophageal cancer Neg Hx    Rectal cancer Neg Hx    Stomach cancer Neg Hx    Social History   Socioeconomic History   Marital status: Single    Spouse name: Not on file   Number of children: Not on file   Years of education: Not on file   Highest education level: Not on file  Occupational History    Comment: unemployed  Tobacco Use   Smoking status: Former    Average packs/day: 1 pack/day for 57.8 years (57.8 ttl pk-yrs)    Types: Cigarettes    Start date: 03/24/1965   Smokeless tobacco: Never  Vaping Use   Vaping status: Every Day   Substances: Nicotine , Flavoring  Substance and Sexual Activity   Alcohol  use: Not Currently   Drug use: No   Sexual activity: Yes    Birth control/protection: None  Other Topics Concern   Not on  file  Social History Narrative   Not on file   Social Drivers of Health   Financial Resource Strain: Low Risk  (09/18/2022)   Overall Financial Resource Strain (CARDIA)    Difficulty of Paying Living Expenses: Not hard at all  Food Insecurity: No Food Insecurity (09/18/2022)   Hunger Vital Sign    Worried About Running Out of Food in the Last Year: Never true    Ran Out of Food in the Last Year: Never true  Transportation Needs: No Transportation Needs (09/18/2022)   PRAPARE - Administrator, Civil Service (Medical): No    Lack of Transportation (Non-Medical): No  Physical Activity: Sufficiently Active (09/18/2022)   Exercise Vital Sign    Days of Exercise per Week: 7 days    Minutes of Exercise per Session: 40 min  Recent Concern: Physical Activity - Inactive (09/18/2022)   Exercise Vital Sign    Days of Exercise per Week: 0 days    Minutes of Exercise per Session: 0 min  Stress: No Stress Concern Present (09/18/2022)   [employer] of Occupational Health - Occupational Stress Questionnaire    Feeling of Stress : Not at all  Social Connections: Socially  Isolated (10/07/2021)   Social Connection and Isolation Panel [NHANES]    Frequency of Communication with Friends and Family: Three times a week    Frequency of Social Gatherings with Friends and Family: Three times a week    Attends Religious Services: Never    Active Member of Clubs or Organizations: No    Attends Banker Meetings: Never    Marital Status: Divorced   Past Surgical History:  Procedure Laterality Date   BUNIONECTOMY Left 07/15/2017   with big toe fracture repair   COLONOSCOPY  07/18/2012   polyps-Dr.Outlaw   HERNIA REPAIR  04/1998   [facility]   NECK SURGERY  07/21/02 and 01/2006   WRIST SURGERY  1992   right   Past Surgical History:  Procedure Laterality Date   BUNIONECTOMY Left 07/15/2017   with big toe fracture repair   COLONOSCOPY  07/18/2012   polyps-Dr.Outlaw   HERNIA REPAIR   04/1998   [facility]   NECK SURGERY  07/21/02 and 01/2006   WRIST SURGERY  1992   right   Past Medical History:  Diagnosis Date   Bell's palsy 06/2008   Chest pain with low risk for cardiac etiology    Low Risk Myoview Sept 2013   DJD (degenerative joint disease)    disabilty secondary to neck issues   Excessive daytime sleepiness 09/11/2021   Hyperlipidemia    Hypertension    Sleep apnea    does not use CPAP   Smoker    BP 126/83   Pulse 68   Ht 5' 8 (1.727 m)   Wt 176 lb (79.8 kg)   SpO2 98%   BMI 26.76 kg/m   Opioid Risk Score:   Fall Risk Score:  `1  Depression screen [facility] 2/9     03/31/2023    9:07 AM 01/19/2023   11:40 AM 12/09/2022   11:39 AM 09/18/2022   11:50 AM 07/17/2022    2:20 PM 06/03/2022   11:24 AM 04/02/2022   11:10 AM  Depression screen PHQ 2/9  Decreased Interest 0 0 0 0 0 0 0  Down, Depressed, Hopeless 0 0 0 0 0 0 0  PHQ - 2 Score 0 0 0 0 0 0 0    Review of Systems  All other systems reviewed and are negative.      Objective:   Physical Exam Vitals and nursing note reviewed.  Constitutional:      Appearance: Normal appearance.  Neck:     Comments: Cervical Paraspinal Tenderness: C-5-C-6  Cardiovascular:     Rate and Rhythm: Normal rate and regular rhythm.     Pulses: Normal pulses.     Heart sounds: Normal heart sounds.  Pulmonary:     Effort: Pulmonary effort is normal.     Breath sounds: Normal breath sounds.  Musculoskeletal:     Comments: Normal Muscle Bulk and Muscle Testing Reveals:  Upper Extremities: Full ROM and Muscle Strength 5/5  Lumbar Paraspinal Tenderness: L-4- L-5 Lower Extremities: Full ROM and Muscle Strength 5/5 Arises from Table with ease Narrow Based  Gait     Skin:    General: Skin is warm and dry.  Neurological:     Mental Status: He is alert and oriented to person, place, and time.  Psychiatric:        Mood and Affect: Mood normal.        Behavior: Behavior normal.         Assessment & Plan:  1.  Cervicalgia:  S/P Fall: No Falls this month. Continue HEP as Tolerated. Continue to Monitor.  Continue to Monitor. 03/31/2023 2. Right Elbow Pain: Ortho Following. Continue to Monitor. 03/31/2023 3.Chronic left inguinal pain: S/P inguinal hernia (indirect) and mesh repair. Continue to Monitor. 01/08//2025. Refilled: Hydrocodone  10/325 mg one tablet  twice a day  as needed #55.Second script sent for the following month. We will continue the opioid monitoring program, this consists of regular clinic visits, examinations, urine drug screen, pill counts as well as use of [place]  Controlled Substance Reporting system. A 12 month History has been reviewed on the [place]  Controlled Substance Reporting System on 03/31/2023.  4. Lumbosacral Spondylosis: Continue with Home Exercise regime. Continue to Monitor. 03/31/2023 5. Muscle Spasm: Continue current medication regimen with Flexeril . Continue to monitor.03/31/2023 6..Chronic Right Shoulder Pain :No complaints today.  Chronic Pain Syndrome:  Continue HEP as tolerated.  Continue to Monitor. 03/31/2023.  7. Cervicalgia/ Cervical Radiculitis:  Continue HEP as Tolerated. Alternate heat and Ice Therapy as tolerated. Continue current medication regimen. Continue to Monitor. 03/31/2023 8. Left Greater Trochanteric Tenderness: No complaints today. Alternate with Ice and Heat Therapy. Continue to Monitor. 03/31/2023 9. Left Shoulder Pain: No complaints today. Continue with HEP as tolerated and alternate with Ice  and Heat  Therapy. 03/31/2023 10. Left Radiculitis: Continue  Gabapentin   one capsule twice a day. Continue to Monitor. 03/31/2023   F/U in 2 months

## 2023-05-25 NOTE — Progress Notes (Unsigned)
 Subjective:    Patient ID: Shaun Lang, male    DOB: September 04, 1955, 68 y.o.   MRN: [id number]  HPI: Shaun Lang is a 68 y.o. male who returns for follow up appointment for chronic pain and medication refill. He states his pain is located in his lower back radiating into his right lower extremity. He rates his pain 7. His current exercise regime is walking and performing stretching exercises.  Shaun Lang Morphine equivalent is 19.64 MME.   UDS ordered today.    Pain Inventory Average Pain 8 Pain Right Now 7 My pain is intermittent and stabbing  In the last 24 hours, has pain interfered with the following? General activity 7 Relation with others 0 Enjoyment of life 5 What TIME of day is your pain at its worst? evening and night Sleep (in general) Poor  Pain is worse with: bending and some activites Pain improves with: medication Relief from Meds: 7  Family History  Problem Relation Age of Onset   Diabetes Mother    Cancer Sister        breast   Diabetes Sister    Stroke Sister 56   Breast cancer Sister    Colon cancer Neg Hx    Esophageal cancer Neg Hx    Rectal cancer Neg Hx    Stomach cancer Neg Hx    Social History   Socioeconomic History   Marital status: Single    Spouse name: Not on file   Number of children: Not on file   Years of education: Not on file   Highest education level: Not on file  Occupational History    Comment: unemployed  Tobacco Use   Smoking status: Former    Average packs/day: 1 pack/day for 57.8 years (57.8 ttl pk-yrs)    Types: Cigarettes    Start date: 03/24/1965   Smokeless tobacco: Never  Vaping Use   Vaping status: Every Day   Substances: Nicotine, Flavoring  Substance and Sexual Activity   Alcohol use: Not Currently   Drug use: No   Sexual activity: Yes    Birth control/protection: None  Other Topics Concern   Not on file  Social History Narrative   Not on file   Social Drivers of Health   Financial Resource Strain:  Low Risk  (09/18/2022)   Overall Financial Resource Strain (CARDIA)    Difficulty of Paying Living Expenses: Not hard at all  Food Insecurity: No Food Insecurity (09/18/2022)   Hunger Vital Sign    Worried About Running Out of Food in the Last Year: Never true    Ran Out of Food in the Last Year: Never true  Transportation Needs: No Transportation Needs (09/18/2022)   PRAPARE - Administrator, Civil Service (Medical): No    Lack of Transportation (Non-Medical): No  Physical Activity: Sufficiently Active (09/18/2022)   Exercise Vital Sign    Days of Exercise per Week: 7 days    Minutes of Exercise per Session: 40 min  Recent Concern: Physical Activity - Inactive (09/18/2022)   Exercise Vital Sign    Days of Exercise per Week: 0 days    Minutes of Exercise per Session: 0 min  Stress: No Stress Concern Present (09/18/2022)   [employer] of Occupational Health - Occupational Stress Questionnaire    Feeling of Stress : Not at all  Social Connections: Socially Isolated (10/07/2021)   Social Connection and Isolation Panel [NHANES]    Frequency of Communication with Friends and  Family: Three times a week    Frequency of Social Gatherings with Friends and Family: Three times a week    Attends Religious Services: Never    Active Member of Clubs or Organizations: No    Attends Banker Meetings: Never    Marital Status: Divorced   Past Surgical History:  Procedure Laterality Date   BUNIONECTOMY Left 07/15/2017   with big toe fracture repair   COLONOSCOPY  07/18/2012   polyps-Dr.Outlaw   HERNIA REPAIR  04/1998   [facility]   NECK SURGERY  07/21/02 and 01/2006   WRIST SURGERY  1992   right   Past Surgical History:  Procedure Laterality Date   BUNIONECTOMY Left 07/15/2017   with big toe fracture repair   COLONOSCOPY  07/18/2012   polyps-Dr.Outlaw   HERNIA REPAIR  04/1998   [facility]   NECK SURGERY  07/21/02 and 01/2006   WRIST SURGERY  1992   right   Past Medical  History:  Diagnosis Date   Bell's palsy 06/2008   Chest pain with low risk for cardiac etiology    Low Risk Myoview Sept 2013   DJD (degenerative joint disease)    disabilty secondary to neck issues   Excessive daytime sleepiness 09/11/2021   Hyperlipidemia    Hypertension    Sleep apnea    does not use CPAP   Smoker    There were no vitals taken for this visit.  Opioid Risk Score:   Fall Risk Score:  `1  Depression screen [facility] 2/9     03/31/2023    9:07 AM 01/19/2023   11:40 AM 12/09/2022   11:39 AM 09/18/2022   11:50 AM 07/17/2022    2:20 PM 06/03/2022   11:24 AM 04/02/2022   11:10 AM  Depression screen PHQ 2/9  Decreased Interest 0 0 0 0 0 0 0  Down, Depressed, Hopeless 0 0 0 0 0 0 0  PHQ - 2 Score 0 0 0 0 0 0 0    Review of Systems  Musculoskeletal:  Positive for back pain and neck pain.       RIGHT ELBOW PAIN  All other systems reviewed and are negative.      Objective:   Physical Exam Vitals and nursing note reviewed.  Constitutional:      Appearance: Normal appearance.  Cardiovascular:     Rate and Rhythm: Normal rate and regular rhythm.     Pulses: Normal pulses.     Heart sounds: Normal heart sounds.  Pulmonary:     Effort: Pulmonary effort is normal.     Breath sounds: Normal breath sounds.  Musculoskeletal:     Comments: Normal Muscle Bulk and Muscle Testing Reveals:  Upper Extremities: Full ROM and Muscle Strength 5/5  Lumbar Paraspinal Tenderness: L-4-L-5 Lower Extremities: Full ROM and Muscle Strength 5/5 Arises from Table with ease Narrow Based  Gait     Skin:    General: Skin is warm and dry.  Neurological:     Mental Status: He is alert and oriented to person, place, and time.  Psychiatric:        Mood and Affect: Mood normal.        Behavior: Behavior normal.         Assessment & Plan:  1. Cervicalgia: S/P Fall: No Falls this month. Continue HEP as Tolerated. Continue to Monitor.  Continue to Monitor. 05/26/2023 2. Right Elbow  Pain: No complaints today.Ortho Following. Continue to Monitor. 05/26/2023 3.Chronic left inguinal pain:  S/P inguinal hernia (indirect) and mesh repair. Continue to Monitor. 03/05//2025. Refilled: Hydrocodone 10/325 mg one tablet  twice a day  as needed #55.Second script sent for the following month. We will continue the opioid monitoring program, this consists of regular clinic visits, examinations, urine drug screen, pill counts as well as use of [place] Controlled Substance Reporting system. A 12 month History has been reviewed on the [place] Controlled Substance Reporting System on 05/26/2023.  4. Lumbosacral Spondylosis: Continue with Home Exercise regime. Continue to Monitor. 05/26/2023 5. Muscle Spasm: Continue current medication regimen with Flexeril. Continue to monitor.05/26/2023 6..Chronic Right Shoulder Pain :No complaints today.  Chronic Pain Syndrome:  Continue HEP as tolerated.  Continue to Monitor. 05/26/2023.  7. Cervicalgia/ Cervical Radiculitis:  Continue HEP as Tolerated. Alternate heat and Ice Therapy as tolerated. Continue current medication regimen. Continue to Monitor. 05/26/2023 8. Left Greater Trochanteric Tenderness: No complaints today. Alternate with Ice and Heat Therapy. Continue to Monitor. 05/26/2023 9. Left Shoulder Pain: No complaints today. Continue with HEP as tolerated and alternate with Ice  and Heat  Therapy. 05/26/2023 10. Left Radiculitis: Continue  Gabapentin  one capsule twice a day. Continue to Monitor. 05/26/2023    F/U in 2 months

## 2023-05-26 ENCOUNTER — Encounter: Payer: Self-pay | Admitting: Registered Nurse

## 2023-05-26 ENCOUNTER — Encounter: Payer: 59 | Attending: Physical Medicine & Rehabilitation | Admitting: Registered Nurse

## 2023-05-26 VITALS — BP 134/82 | HR 68 | Ht 68.0 in | Wt 181.0 lb

## 2023-05-26 DIAGNOSIS — Z79891 Long term (current) use of opiate analgesic: Secondary | ICD-10-CM | POA: Insufficient documentation

## 2023-05-26 DIAGNOSIS — G894 Chronic pain syndrome: Secondary | ICD-10-CM | POA: Diagnosis present

## 2023-05-26 DIAGNOSIS — Z5181 Encounter for therapeutic drug level monitoring: Secondary | ICD-10-CM | POA: Diagnosis not present

## 2023-05-26 DIAGNOSIS — M5416 Radiculopathy, lumbar region: Secondary | ICD-10-CM | POA: Diagnosis not present

## 2023-05-26 MED ORDER — GABAPENTIN 300 MG PO CAPS: 300.0000 mg | ORAL_CAPSULE | Freq: Two times a day (BID) | ORAL | 2 refills | Status: DC

## 2023-05-26 MED ORDER — HYDROCODONE-ACETAMINOPHEN 10-325 MG PO TABS: 1.0000 | ORAL_TABLET | Freq: Two times a day (BID) | ORAL | 0 refills | Status: DC | PRN

## 2023-05-29 LAB — TOXASSURE SELECT,+ANTIDEPR,UR

## 2023-07-21 ENCOUNTER — Ambulatory Visit (INDEPENDENT_AMBULATORY_CARE_PROVIDER_SITE_OTHER): Payer: 59 | Admitting: Nurse Practitioner

## 2023-07-21 ENCOUNTER — Encounter: Payer: Self-pay | Admitting: Nurse Practitioner

## 2023-07-21 VITALS — BP 100/58 | HR 76 | Temp 98.6°F | Ht 68.0 in | Wt 184.4 lb

## 2023-07-21 DIAGNOSIS — G8929 Other chronic pain: Secondary | ICD-10-CM | POA: Insufficient documentation

## 2023-07-21 DIAGNOSIS — R351 Nocturia: Secondary | ICD-10-CM

## 2023-07-21 DIAGNOSIS — E782 Mixed hyperlipidemia: Secondary | ICD-10-CM

## 2023-07-21 DIAGNOSIS — M25552 Pain in left hip: Secondary | ICD-10-CM

## 2023-07-21 DIAGNOSIS — I10 Essential (primary) hypertension: Secondary | ICD-10-CM

## 2023-07-21 DIAGNOSIS — R739 Hyperglycemia, unspecified: Secondary | ICD-10-CM | POA: Diagnosis not present

## 2023-07-21 DIAGNOSIS — D61818 Other pancytopenia: Secondary | ICD-10-CM

## 2023-07-21 DIAGNOSIS — Z720 Tobacco use: Secondary | ICD-10-CM | POA: Diagnosis not present

## 2023-07-21 DIAGNOSIS — Z122 Encounter for screening for malignant neoplasm of respiratory organs: Secondary | ICD-10-CM

## 2023-07-21 LAB — LIPID PANEL
Cholesterol: 218 mg/dL — ABNORMAL HIGH (ref 0–200)
HDL: 67.8 mg/dL (ref >39.00)
LDL Cholesterol: 126 mg/dL — ABNORMAL HIGH (ref 0–99)
NonHDL: 149.73
Total CHOL/HDL Ratio: 3
Triglycerides: 118 mg/dL (ref 0.0–149.0)
VLDL: 23.6 mg/dL (ref 0.0–40.0)

## 2023-07-21 LAB — COMPREHENSIVE METABOLIC PANEL WITH GFR
ALT: 13 U/L (ref 0–53)
AST: 18 U/L (ref 0–37)
Albumin: 4.4 g/dL (ref 3.5–5.2)
Alkaline Phosphatase: 51 U/L (ref 39–117)
BUN: 16 mg/dL (ref 6–23)
CO2: 30 meq/L (ref 19–32)
Calcium: 8.9 mg/dL (ref 8.4–10.5)
Chloride: 102 meq/L (ref 96–112)
Creatinine, Ser: 1.13 mg/dL (ref 0.40–1.50)
GFR: 66.94 mL/min (ref >60.00)
Glucose, Bld: 104 mg/dL — ABNORMAL HIGH (ref 70–99)
Potassium: 3.8 meq/L (ref 3.5–5.1)
Sodium: 138 meq/L (ref 135–145)
Total Bilirubin: 0.4 mg/dL (ref 0.2–1.2)
Total Protein: 6.7 g/dL (ref 6.0–8.3)

## 2023-07-21 LAB — IRON,TIBC AND FERRITIN PANEL
%SAT: 31 % (ref 20–48)
Ferritin: 321 ng/mL (ref 24–380)
Iron: 79 ug/dL (ref 50–180)
TIBC: 252 ug/dL (ref 250–425)

## 2023-07-21 LAB — HEMOGLOBIN A1C: Hgb A1c MFr Bld: 6.1 % (ref 4.6–6.5)

## 2023-07-21 LAB — PSA: PSA: 0.47 ng/mL (ref 0.10–4.00)

## 2023-07-21 NOTE — Assessment & Plan Note (Signed)
 Hypotension noted today due to taking BP med and hydrocodone  this AM. Asymptomatic. He stopped taking lisinopril  several month ago. No specific reason. BP Readings from Last 3 Encounters:  07/21/23 (!) 100/58  05/26/23 134/82  03/31/23 126/83    Advised to Stop vaping Stop maxzide Continue lisinopril  Monitor BP daily in AM Call office if BP >140/80 or remains<110/70. F/up in 6months

## 2023-07-21 NOTE — Assessment & Plan Note (Addendum)
 Advised to quit He agreed to repeat CT chest low dose without contrast for lung cancer screen

## 2023-07-21 NOTE — Assessment & Plan Note (Signed)
 Repeat hgbA1c

## 2023-07-21 NOTE — Assessment & Plan Note (Addendum)
 Had eval by ortho 2020 and 2022 2020 and 2022 X-ray indicates DJD, 2020 pelvic US  was normal Intra articular corticosteroid administered by ortho in 2020: he reports no improvement. Current use of gabapentin  at hs with some improvement Pain is worse in lateral position at bedtime and prolong walking. No paresthesia, no weakness  Advised to maintain current med doses Entered outpatient PT referral Ref to ortho if no improvement

## 2023-07-21 NOTE — Progress Notes (Signed)
 Established Patient Visit  Patient: Shaun Lang   DOB: [birth year]   68 y.o. Male  MRN: [id number] Visit Date: 07/21/2023  Subjective:    Chief Complaint  Patient presents with   Follow-up   HPI Hypertension Hypotension noted today due to taking BP med and hydrocodone  this AM. Asymptomatic. He stopped taking lisinopril  several month ago. No specific reason. BP Readings from Last 3 Encounters:  07/21/23 (!) 100/58  05/26/23 134/82  03/31/23 126/83    Advised to Stop vaping Stop maxzide Continue lisinopril  Monitor BP daily in AM Call office if BP >140/80 or remains<110/70. F/up in 6months  Other pancytopenia (HCC) Repeat CBC and iron panel  Hyperlipidemia Repeat lipid panel and CMP No adverse effect with crestor   Current every day nicotine  vapor product user Advised to quit He agreed to repeat CT chest low dose without contrast for lung cancer screen  Hyperglycemia Repeat hgbA1c  Chronic left hip pain Had eval by ortho 2020 and 2022 2020 and 2022 X-ray indicates DJD, 2020 pelvic US  was normal Intra articular corticosteroid administered by ortho in 2020: he reports no improvement. Current use of gabapentin  at hs with some improvement Pain is worse in lateral position at bedtime and prolong walking. No paresthesia, no weakness  Advised to maintain current med doses Entered outpatient PT referral Ref to ortho if no improvement   Wt Readings from Last 3 Encounters:  07/21/23 184 lb 6.4 oz (83.6 kg)  05/26/23 181 lb (82.1 kg)  03/31/23 176 lb (79.8 kg)    Reviewed medical, surgical, and social history today  Medications: Outpatient Medications Prior to Visit  Medication Sig   gabapentin  (NEURONTIN ) 300 MG capsule Take 1 capsule (300 mg total) by mouth 2 (two) times daily.   HYDROcodone -acetaminophen  (NORCO) 10-325 MG tablet Take 1 tablet by mouth 2 (two) times daily as needed for moderate pain (pain score 4-6).   lisinopril  (ZESTRIL ) 5 MG  tablet Take 1 tablet (5 mg total) by mouth daily.   rosuvastatin  (CRESTOR ) 40 MG tablet Take 1 tablet (40 mg total) by mouth daily.   [DISCONTINUED] triamterene -hydrochlorothiazide (MAXZIDE-25) 37.5-25 MG tablet Take 1 tablet by mouth daily.   [DISCONTINUED] terbinafine  (LAMISIL ) 250 MG tablet Take 1 tablet (250 mg total) by mouth daily. (Patient not taking: Reported on 07/21/2023)   No facility-administered medications prior to visit.   Reviewed past medical and social history.   ROS per HPI above      Objective:  BP (!) 100/58   Pulse 76   Temp 98.6 F (37 C) (Temporal)   Ht 5\' 8"  (1.727 m)   Wt 184 lb 6.4 oz (83.6 kg)   SpO2 97%   BMI 28.04 kg/m      Physical Exam Vitals and nursing note reviewed.  Cardiovascular:     Rate and Rhythm: Normal rate and regular rhythm.     Pulses: Normal pulses.     Heart sounds: Normal heart sounds.  Pulmonary:     Effort: Pulmonary effort is normal.     Breath sounds: Normal breath sounds.  Musculoskeletal:     Right hip: Normal.     Left hip: Tenderness present. No deformity, bony tenderness or crepitus. Normal range of motion. Normal strength.     Right upper leg: Normal.     Left upper leg: Normal.     Comments: Anterior and gluteal pain with left hip flexion and external rotation  Neurological:     Mental Status: He is alert and oriented to person, place, and time.     No results found for any visits on 07/21/23.    Assessment & Plan:    Problem List Items Addressed This Visit     Chronic left hip pain   Had eval by ortho 2020 and 2022 2020 and 2022 X-ray indicates DJD, 2020 pelvic US  was normal Intra articular corticosteroid administered by ortho in 2020: he reports no improvement. Current use of gabapentin  at hs with some improvement Pain is worse in lateral position at bedtime and prolong walking. No paresthesia, no weakness  Advised to maintain current med doses Entered outpatient PT referral Ref to ortho if no  improvement      Relevant Orders   Ambulatory referral to Physical Therapy   Current every day nicotine  vapor product user   Advised to quit He agreed to repeat CT chest low dose without contrast for lung cancer screen      Relevant Orders   CT CHEST LUNG CANCER SCREENING LOW DOSE WO CONTRAST   Encounter for screening for lung cancer   Relevant Orders   CT CHEST LUNG CANCER SCREENING LOW DOSE WO CONTRAST   Hyperglycemia   Repeat hgbA1c      Relevant Orders   Hemoglobin A1c   Comprehensive metabolic panel with GFR   Hyperlipidemia   Repeat lipid panel and CMP No adverse effect with crestor       Relevant Orders   Lipid panel   Comprehensive metabolic panel with GFR   Hypertension - Primary   Hypotension noted today due to taking BP med and hydrocodone  this AM. Asymptomatic. He stopped taking lisinopril  several month ago. No specific reason. BP Readings from Last 3 Encounters:  07/21/23 (!) 100/58  05/26/23 134/82  03/31/23 126/83    Advised to Stop vaping Stop maxzide Continue lisinopril  Monitor BP daily in AM Call office if BP >140/80 or remains<110/70. F/up in 6months      Relevant Orders   Comprehensive metabolic panel with GFR   Other pancytopenia (HCC)   Repeat CBC and iron panel      Relevant Orders   Iron, TIBC and Ferritin Panel   Other Visit Diagnoses       Nocturia       Relevant Orders   PSA      Return in about 6 months (around 01/20/2024) for HTN, hyperlipidemia (fasting).     Kathrene Parents, NP

## 2023-07-21 NOTE — Assessment & Plan Note (Signed)
Repeat CBC and iron panel

## 2023-07-21 NOTE — Assessment & Plan Note (Signed)
 Repeat lipid panel and CMP No adverse effect with crestor 

## 2023-07-21 NOTE — Patient Instructions (Addendum)
 Go to lab Maintain Heart healthy diet and daily exercise. Stop vaping Stop maxzide Continue lisinopril  Monitor BP daily in AM Call office if BP >140/80 or remains<110/70.

## 2023-07-21 NOTE — Progress Notes (Signed)
 Subjective:    Patient ID: Shaun Lang, male    DOB: 1955/09/29, 68 y.o.   MRN: [id number]  HPI: Shaun Lang is a 68 y.o. male who returns for follow up appointment for chronic pain and medication refill. He states his pain is located in his lower back radiating into his left lower extremity and left hip pain. He rates his pain 7. His current exercise regime is walking and performing stretching exercises.  Shaun Lang Morphine equivalent is 19.64 MME.   Last UDS was Performed on 05/26/2023, it was consistent.    Pain Inventory Average Pain 8 Pain Right Now 7 My pain is intermittent, burning, stabbing, and aching  In the last 24 hours, has pain interfered with the following? General activity 7 Relation with others 8 Enjoyment of life 8 What TIME of day is your pain at its worst? daytime and night Sleep (in general) Poor  Pain is worse with: bending, sitting, inactivity, standing, and some activites Pain improves with: rest and medication Relief from Meds: 6  Family History  Problem Relation Age of Onset   Diabetes Mother    Cancer Sister        breast   Diabetes Sister    Stroke Sister 46   Breast cancer Sister    Colon cancer Neg Hx    Esophageal cancer Neg Hx    Rectal cancer Neg Hx    Stomach cancer Neg Hx    Social History   Socioeconomic History   Marital status: Single    Spouse name: Not on file   Number of children: Not on file   Years of education: Not on file   Highest education level: Not on file  Occupational History    Comment: unemployed  Tobacco Use   Smoking status: Former    Average packs/day: 1 pack/day for 57.8 years (57.8 ttl pk-yrs)    Types: Cigarettes    Start date: 03/24/1965   Smokeless tobacco: Never  Vaping Use   Vaping status: Every Day   Substances: Nicotine , Flavoring  Substance and Sexual Activity   Alcohol  use: Yes    Comment: occassionally   Drug use: No   Sexual activity: Yes    Birth control/protection: None  Other  Topics Concern   Not on file  Social History Narrative   Not on file   Social Drivers of Health   Financial Resource Strain: Low Risk  (09/18/2022)   Overall Financial Resource Strain (CARDIA)    Difficulty of Paying Living Expenses: Not hard at all  Food Insecurity: No Food Insecurity (09/18/2022)   Hunger Vital Sign    Worried About Running Out of Food in the Last Year: Never true    Ran Out of Food in the Last Year: Never true  Transportation Needs: No Transportation Needs (09/18/2022)   PRAPARE - Administrator, Civil Service (Medical): No    Lack of Transportation (Non-Medical): No  Physical Activity: Sufficiently Active (09/18/2022)   Exercise Vital Sign    Days of Exercise per Week: 7 days    Minutes of Exercise per Session: 40 min  Recent Concern: Physical Activity - Inactive (09/18/2022)   Exercise Vital Sign    Days of Exercise per Week: 0 days    Minutes of Exercise per Session: 0 min  Stress: No Stress Concern Present (09/18/2022)   [employer] of Occupational Health - Occupational Stress Questionnaire    Feeling of Stress : Not at all  Social  Connections: Socially Isolated (10/07/2021)   Social Connection and Isolation Panel [NHANES]    Frequency of Communication with Friends and Family: Three times a week    Frequency of Social Gatherings with Friends and Family: Three times a week    Attends Religious Services: Never    Active Member of Clubs or Organizations: No    Attends Banker Meetings: Never    Marital Status: Divorced   Past Surgical History:  Procedure Laterality Date   BUNIONECTOMY Left 07/15/2017   with big toe fracture repair   COLONOSCOPY  07/18/2012   polyps-Dr.Outlaw   [facility]IA REPAIR  04/1998   [facility]   NECK SURGERY  07/21/02 and 01/2006   WRIST SURGERY  1992   right   Past Surgical History:  Procedure Laterality Date   BUNIONECTOMY Left 07/15/2017   with big toe fracture repair   COLONOSCOPY  07/18/2012    polyps-Dr.Outlaw   HERNIA REPAIR  04/1998   [facility]   NECK SURGERY  07/21/02 and 01/2006   WRIST SURGERY  1992   right   Past Medical History:  Diagnosis Date   Bell's palsy 06/2008   Chest pain with low risk for cardiac etiology    Low Risk Myoview Sept 2013   DJD (degenerative joint disease)    disabilty secondary to neck issues   Excessive daytime sleepiness 09/11/2021   Hyperlipidemia    Hypertension    Ilioinguinal neuralgia 06/01/2012   Sleep apnea    does not use CPAP   Smoker    BP 137/79   Pulse 63   Ht 5\' 8"  (1.727 m)   Wt 183 lb (83 kg)   SpO2 99%   BMI 27.83 kg/m   Opioid Risk Score:   Fall Risk Score:  `1  Depression screen PHQ 2/9     07/21/2023    1:05 PM 05/26/2023    9:22 AM 03/31/2023    9:07 AM 01/19/2023   11:40 AM 12/09/2022   11:39 AM 09/18/2022   11:50 AM 07/17/2022    2:20 PM  Depression screen PHQ 2/9  Decreased Interest 0 0 0 0 0 0 0  Down, Depressed, Hopeless 0 0 0 0 0 0 0  PHQ - 2 Score 0 0 0 0 0 0 0    Review of Systems  Genitourinary:        Right elbow pain, left hip pain  Musculoskeletal:  Positive for back pain.  All other systems reviewed and are negative.      Objective:   Physical Exam Vitals and nursing note reviewed.  Constitutional:      Appearance: Normal appearance.  Cardiovascular:     Rate and Rhythm: Normal rate and regular rhythm.     Pulses: Normal pulses.     Heart sounds: Normal heart sounds.  Pulmonary:     Effort: Pulmonary effort is normal.     Breath sounds: Normal breath sounds.  Musculoskeletal:     Comments: Normal Muscle Bulk and Muscle Testing Reveals:  Upper Extremities: Full ROM and Muscle Strength 5/5  Lumbar Paraspinal Tenderness: L-4-L-5 Lower Extremities: Full ROM and Muscle Strength 5/5 Arises from chair with ease Narrow Based  Gait     Skin:    General: Skin is warm and dry.  Neurological:     Mental Status: He is alert and oriented to person, place, and time.  Psychiatric:        Mood  and Affect: Mood normal.  Behavior: Behavior normal.         Assessment & Plan:  1. Cervicalgia: S/P Fall: No Falls this month. Continue HEP as Tolerated. Continue to Monitor.  Continue to Monitor. 07/22/2023 2. Right Elbow Pain: No complaints today.Ortho Following. Continue to Monitor. 07/22/2023 3.Chronic left inguinal pain: S/P inguinal hernia (indirect) and mesh repair. Continue to Monitor. 05/01//2025. Refilled: Hydrocodone  10/325 mg one tablet  twice a day  as needed #55.Second script sent for the following month. We will continue the opioid monitoring program, this consists of regular clinic visits, examinations, urine drug screen, pill counts as well as use of [place]  Controlled Substance Reporting system. A 12 month History has been reviewed on the   Controlled Substance Reporting System on 07/22/2023.  4. Lumbosacral Spondylosis: Continue with Home Exercise regime. Continue to Monitor. 07/22/2023 5. Muscle Spasm: Continue current medication regimen with Flexeril . Continue to monitor.07/22/2023 6..Chronic Right Shoulder Pain :No complaints today.  Chronic Pain Syndrome:  Continue HEP as tolerated.  Continue to Monitor. 07/22/2023.  7. Cervicalgia/ Cervical Radiculitis:  No complaints today. Continue HEP as Tolerated. Alternate heat and Ice Therapy as tolerated. Continue current medication regimen. Continue to Monitor. 07/22/2023 8. Left Greater Trochanteric Tenderness:  Alternate with Ice and Heat Therapy. Continue to Monitor. 07/22/2023 9. Left Shoulder Pain: No complaints today. Continue with HEP as tolerated and alternate with Ice  and Heat  Therapy. 07/22/2023 10. Left Radiculitis: Continue  Gabapentin   one capsule twice a day. Continue to Monitor. 07/22/2023    F/U in 2 months

## 2023-07-22 ENCOUNTER — Encounter: Attending: Physical Medicine & Rehabilitation | Admitting: Registered Nurse

## 2023-07-22 ENCOUNTER — Encounter: Payer: Self-pay | Admitting: Registered Nurse

## 2023-07-22 VITALS — BP 137/79 | HR 63 | Ht 68.0 in | Wt 183.0 lb

## 2023-07-22 DIAGNOSIS — Z5181 Encounter for therapeutic drug level monitoring: Secondary | ICD-10-CM | POA: Diagnosis present

## 2023-07-22 DIAGNOSIS — G894 Chronic pain syndrome: Secondary | ICD-10-CM

## 2023-07-22 DIAGNOSIS — M5416 Radiculopathy, lumbar region: Secondary | ICD-10-CM | POA: Diagnosis not present

## 2023-07-22 DIAGNOSIS — M7062 Trochanteric bursitis, left hip: Secondary | ICD-10-CM | POA: Diagnosis present

## 2023-07-22 DIAGNOSIS — Z79891 Long term (current) use of opiate analgesic: Secondary | ICD-10-CM | POA: Diagnosis present

## 2023-07-22 MED ORDER — HYDROCODONE-ACETAMINOPHEN 10-325 MG PO TABS: 1.0000 | ORAL_TABLET | Freq: Two times a day (BID) | ORAL | 0 refills | Status: DC | PRN

## 2023-07-26 ENCOUNTER — Encounter: Payer: Self-pay | Admitting: Nurse Practitioner

## 2023-08-04 NOTE — Therapy (Unsigned)
 OUTPATIENT PHYSICAL THERAPY THORACOLUMBAR EVALUATION   Patient Name: Shaun Lang MRN: [id number] DOB:03-10-1956, 68 y.o., male Today's Date: 08/04/2023  END OF SESSION:   Past Medical History:  Diagnosis Date   Bell's palsy 06/2008   Chest pain with low risk for cardiac etiology    Low Risk Myoview Sept 2013   DJD (degenerative joint disease)    disabilty secondary to neck issues   Excessive daytime sleepiness 09/11/2021   Hyperlipidemia    Hypertension    Ilioinguinal neuralgia 06/01/2012   Sleep apnea    does not use CPAP   Smoker    Past Surgical History:  Procedure Laterality Date   BUNIONECTOMY Left 07/15/2017   with big toe fracture repair   COLONOSCOPY  07/18/2012   polyps-Dr.Outlaw   HERNIA REPAIR  04/1998   [facility]   NECK SURGERY  07/21/02 and 01/2006   WRIST SURGERY  1992   right   Patient Active Problem List   Diagnosis Date Noted   Chronic left hip pain 07/21/2023   Onychomycosis 07/17/2022   Hyperglycemia 01/18/2022   Other pancytopenia (HCC) 10/02/2021   Persistent circadian rhythm sleep disorder, shift work type 09/11/2021   Other male erectile dysfunction 07/14/2021   Encounter for screening for lung cancer 10/18/2019   Chronic pain syndrome 09/29/2017   Lumbosacral spondylosis without myelopathy 09/29/2017   OSA (obstructive sleep apnea) 06/21/2017   Myofascial pain 02/16/2017   Insomnia 10/14/2016   Rotator cuff syndrome of left shoulder 01/30/2015   Arthritis of left acromioclavicular joint 01/30/2015   Knee pain, bilateral 06/05/2014   Right rotator cuff tendinitis 07/07/2013   Chest pain with low risk for cardiac etiology    Hypertension    Hyperlipidemia    DJD (degenerative joint disease)    Current every day nicotine  vapor product user    Bell's palsy 06/21/2008    PCP: ***  REFERRING PROVIDER: Kandace Organ, NP  REFERRING DIAG: [phone] (ICD-10-CM) - Chronic left hip pain  Rationale for Evaluation and Treatment:  Rehabilitation  THERAPY DIAG:  No diagnosis found.  ONSET DATE: 2012  SUBJECTIVE:                                                                                                                                                                                           SUBJECTIVE STATEMENT: Burning pain in L lateral hip. He has difficulty walking, lying on his Lt side. The [name] has always been weaker. L foot stays cold.  Denies numbness and tingling .  Chronic back pain as well.    PERTINENT HISTORY:  Had eval by ortho 2020 and 2022 2020  and 2022 X-ray indicates DJD, 2020 pelvic US  was normal Intra articular corticosteroid administered by ortho in 2020: he reports no improvement. Current use of gabapentin  at hs with some improvement Pain is worse in lateral position at bedtime and prolong walking. No paresthesia, no weakness   Advised to maintain current med doses Entered outpatient PT referral Ref to ortho if no improvement    PAIN:  Are you having pain? Yes: NPRS scale: 3/10-4/10 Pain location: L lateral hip  Pain description: irritation, burning  Aggravating factors: laying down, activity does not really  Relieving factors: walking, not sure   PRECAUTIONS: None  RED FLAGS: None   WEIGHT BEARING RESTRICTIONS: No  FALLS:  Has patient fallen in last 6 months? Yes. Number of falls 1, slipped on step  LIVING ENVIRONMENT: Lives with: lives alone Lives in: House/apartment Stairs: Yes: External: 34 steps; on right going up, no trouble lately Has following equipment at home: None  OCCUPATION: not working   PLOF: Independent, Independent with household mobility without device, Independent with community mobility without device, Vocation/Vocational requirements: not working , and Leisure: cards, chess   PATIENT GOALS: Make it go away   NEXT MD VISIT: not sure   OBJECTIVE:  Note: Objective measures were completed at Evaluation unless otherwise noted.  DIAGNOSTIC  FINDINGS:  ***  PATIENT SURVEYS:  LEFS ***  COGNITION: Overall cognitive status: Within functional limits for tasks assessed     SENSATION: WNL   MUSCLE LENGTH: Hamstrings: Right *** deg; Left *** deg Andy Bannister test: Right *** deg; Left *** deg  POSTURE: rounded shoulders, forward head, and genu varus   PALPATION: ***  LUMBAR ROM:   AROM eval  Flexion WNL   Extension Pain in back   Right lateral flexion   Left lateral flexion   Right rotation stiff  Left rotation Stiff    (Blank rows = not tested)  LOWER EXTREMITY ROM:     Active  Right eval Left eval  Hip flexion Full  Full   Hip extension    Hip abduction    Hip adduction    Hip internal rotation 25 25  Hip external rotation 45 35  Knee flexion Full  Full   Knee extension    Ankle dorsiflexion    Ankle plantarflexion    Ankle inversion    Ankle eversion     (Blank rows = not tested)  LOWER EXTREMITY MMT:    MMT Right eval Left eval  Hip flexion 5 4  Hip extension    Hip abduction    Hip adduction    Hip internal rotation    Hip external rotation    Knee flexion 5 5  Knee extension 5 5  Ankle dorsiflexion    Ankle plantarflexion    Ankle inversion    Ankle eversion     (Blank rows = not tested)  LUMBAR SPECIAL TESTS:  {lumbar special test:25242}  FUNCTIONAL TESTS:  {Functional tests:24029}  GAIT: Distance walked: *** Assistive device utilized: {Assistive devices:23999} Level of assistance: {Levels of assistance:24026} Comments: ***  TREATMENT DATE:   [facility] Adult PT Treatment:                                                DATE: 08/05/23 Therapeutic Exercise: *** Manual Therapy: *** Neuromuscular re-ed: *** Therapeutic Activity: *** Modalities: *** Self Care: ***  PATIENT EDUCATION:  Education details: *** Person educated: {Person  educated:25204} Education method: {Education Method:25205} Education comprehension: {Education Comprehension:25206}  HOME EXERCISE PROGRAM: Access Code: [email or web address] URL: [email or web address] Date: 08/05/2023 Prepared by: Marci Setter  Exercises - Sidelying Hip Abduction  - 1 x daily - 7 x weekly - 2 sets - 10 reps - 5 hold - Supine Bridge  - 1 x daily - 7 x weekly - 2 sets - 10 reps - 5 hold - Supine Piriformis Stretch with Foot on Ground  - 1 x daily - 7 x weekly - 1 sets - 3-5 reps - 30 hold  ASSESSMENT:  CLINICAL IMPRESSION: Patient is a 68 y.o. male who was seen today for physical therapy evaluation and treatment for hip pain which is   OBJECTIVE IMPAIRMENTS: decreased balance, decreased mobility, difficulty walking, decreased ROM, decreased strength, hypomobility, increased fascial restrictions, impaired flexibility, and pain.   ACTIVITY LIMITATIONS: squatting, sleeping, bed mobility, and locomotion level  PARTICIPATION LIMITATIONS: community activity  PERSONAL FACTORS: Time since onset of injury/illness/exacerbation and 1-2 comorbidities: chronic back pain, previous neck surgery  are also affecting patient's functional outcome.   REHAB POTENTIAL: Good  CLINICAL DECISION MAKING: Evolving/moderate complexity  EVALUATION COMPLEXITY: Moderate   GOALS: Goals reviewed with patient? Yes  SHORT TERM GOALS= LONG TERM GOALS: Target date: 09/16/2023    Pt will be I with HEP  Baseline: Goal status: INITIAL  2.  Patient will be able to demonstrate proper posture and lifting techniques related to spine health and reduction of symptoms.   Baseline:  Goal status: INITIAL  3.  Patient will be able to stand/walk for 30 min without increasing pain in  Baseline:  Goal status: INITIAL  4.  Sleep on L side  Baseline:  Goal status: INITIAL  5.  LEFS  Baseline:  Goal status: INITIAL   PLAN:  PT FREQUENCY: 2x/week  PT DURATION: 6 weeks  PLANNED  INTERVENTIONS: 97164- PT Re-evaluation, 97750- Physical Performance Testing, 97110-Therapeutic exercises, 97530- Therapeutic activity, [id number]- Neuromuscular re-education, 97535- Self Care, 16109- Manual therapy, Patient/Family education, Balance training, Dry Needling, Joint mobilization, Cryotherapy, and Moist heat.  PLAN FOR NEXT SESSION: check HEP, Nustep, stretch hips, ITB, core stability    Hykeem Ojeda, PT 08/04/2023, 2:48 PM

## 2023-08-05 ENCOUNTER — Ambulatory Visit: Attending: Nurse Practitioner | Admitting: Physical Therapy

## 2023-08-05 DIAGNOSIS — M25552 Pain in left hip: Secondary | ICD-10-CM | POA: Diagnosis present

## 2023-08-05 DIAGNOSIS — G8929 Other chronic pain: Secondary | ICD-10-CM | POA: Diagnosis not present

## 2023-08-05 DIAGNOSIS — M25652 Stiffness of left hip, not elsewhere classified: Secondary | ICD-10-CM | POA: Insufficient documentation

## 2023-08-12 NOTE — Therapy (Signed)
 OUTPATIENT PHYSICAL THERAPY NOTE    Patient Name: Shaun Lang MRN: [id number] DOB:[birth year], 68 y.o., male Today's Date: 08/13/2023  END OF SESSION:  PT End of Session - 08/13/23 0934     Visit Number 2    Number of Visits 12    Date for PT Re-Evaluation 09/16/23    Authorization Type [employer] Dual Complete MCR and MCD    PT Start Time 0935    PT Stop Time 1013    PT Time Calculation (min) 38 min    Activity Tolerance Patient tolerated treatment well              Past Medical History:  Diagnosis Date   Bell's palsy 06/2008   Chest pain with low risk for cardiac etiology    Low Risk Myoview Sept 2013   DJD (degenerative joint disease)    disabilty secondary to neck issues   Excessive daytime sleepiness 09/11/2021   Hyperlipidemia    Hypertension    Ilioinguinal neuralgia 06/01/2012   Sleep apnea    does not use CPAP   Smoker    Past Surgical History:  Procedure Laterality Date   BUNIONECTOMY Left 07/15/2017   with big toe fracture repair   COLONOSCOPY  07/18/2012   polyps-Dr.Outlaw   [facility]   NECK SURGERY  07/21/02 and 01/2006   WRIST SURGERY  1992   right   Patient Active Problem List   Diagnosis Date Noted   Chronic left hip pain 07/21/2023   Onychomycosis 07/17/2022   Hyperglycemia 01/18/2022   Other pancytopenia (HCC) 10/02/2021   Persistent circadian rhythm sleep disorder, shift work type 09/11/2021   Other male erectile dysfunction 07/14/2021   Encounter for screening for lung cancer 10/18/2019   Chronic pain syndrome 09/29/2017   Lumbosacral spondylosis without myelopathy 09/29/2017   OSA (obstructive sleep apnea) 06/21/2017   Myofascial pain 02/16/2017   Insomnia 10/14/2016   Rotator cuff syndrome of left shoulder 01/30/2015   Arthritis of left acromioclavicular joint 01/30/2015   Knee pain, bilateral 06/05/2014   Right rotator cuff tendinitis 07/07/2013   Chest pain with low risk for cardiac etiology    Hypertension     Hyperlipidemia    DJD (degenerative joint disease)    Current every day nicotine  vapor product user    Bell's palsy 06/21/2008    PCP: Shaun Organ NP  REFERRING PROVIDER: Kandace Organ, NP  REFERRING DIAG: [phone] (ICD-10-CM) - Chronic left hip pain  Rationale for Evaluation and Treatment: Rehabilitation  THERAPY DIAG:  Pain in left hip  Stiffness of left hip, not elsewhere classified  ONSET DATE: 2012  SUBJECTIVE:  SUBJECTIVE STATEMENT: Pt without new complaints.  Did not do his exercises.  He took a pain pill.     Pt presents with burning pain in L lateral hip. He has difficulty walking, lying on his Lt side. The [name] has always been weaker. L foot stays cold.  Denies numbness and tingling .  Chronic back pain as well.  He currently goes to the [facility], walking, lifting and pain is not affected by this.   PERTINENT HISTORY:  Had eval by ortho 2020 and 2022 2020 and 2022 X-ray indicates DJD, 2020 pelvic US  was normal Intra articular corticosteroid administered by ortho in 2020: he reports no improvement. Current use of gabapentin  at hs with some improvement Pain is worse in lateral position at bedtime and prolong walking. No paresthesia, no weakness   Advised to maintain current med doses Entered outpatient PT referral Ref to ortho if no improvement    PAIN:  Are you having pain? Yes: NPRS scale: 6/10 Pain location: L back lateral hip  Pain description: irritation, burning  Aggravating factors: laying down, activity does not really  Relieving factors: walking, not sure   PRECAUTIONS: None  RED FLAGS: None   WEIGHT BEARING RESTRICTIONS: No  FALLS:  Has patient fallen in last 6 months? Yes. Number of falls 1, slipped on step  LIVING ENVIRONMENT: Lives with:  lives alone Lives in: House/apartment Stairs: Yes: External: 34 steps; on right going up, no trouble lately Has following equipment at home: None  OCCUPATION: not working   PLOF: Independent, Independent with household mobility without device, Independent with community mobility without device, Vocation/Vocational requirements: not working , and Leisure: cards, chess   PATIENT GOALS: Make it go away   NEXT MD VISIT: not sure   OBJECTIVE:  Note: Objective measures were completed at Evaluation unless otherwise noted.  DIAGNOSTIC FINDINGS:  No hip imaging recent   PATIENT SURVEYS:  LEFS 32/80  COGNITION: Overall cognitive status: Within functional limits for tasks assessed     SENSATION: WNL   MUSCLE LENGTH: Hamstrings: tight  Thomas test:tight   POSTURE: rounded shoulders, forward head, and genu varus   PALPATION: Pain along Lt Gr. Trochanter, TTP L glute med.  LUMBAR ROM:   AROM eval  Flexion WNL   Extension Pain in back   Right lateral flexion   Left lateral flexion   Right rotation stiff  Left rotation Stiff    (Blank rows = not tested)  LOWER EXTREMITY ROM:     Active  Right eval Left eval  Hip flexion Full  Full   Hip extension    Hip abduction    Hip adduction    Hip internal rotation 25 25  Hip external rotation 45 35  Knee flexion Full  Full   Knee extension    Ankle dorsiflexion    Ankle plantarflexion    Ankle inversion    Ankle eversion     (Blank rows = not tested)  LOWER EXTREMITY MMT:    MMT Right eval Left eval  Hip flexion 5 4  Hip extension 4 3  Hip abduction 4 3  Hip adduction    Hip internal rotation    Hip external rotation    Knee flexion 5 5  Knee extension 5 5  Ankle dorsiflexion    Ankle plantarflexion    Ankle inversion    Ankle eversion     (Blank rows = not tested)    GAIT: Distance walked: 150 Assistive device utilized: None Level of  assistance: Complete Independence Comments: slower pace    TREATMENT DATE:    [facility] Adult PT Treatment:                                                DATE: 08/13/23 Therapeutic Exercise: Nustep LE only L6 for 6 min   Therapeutic Activity: Seated piriformis Hamstring stretch  Knee to chest Lower trunk rotation, added legs crossed  Bridge x 10  Hamstring with strap, ITB with strap 30 sec  SL hip abduction 2 x 10 Standing L stretch at the bar Deep supported squat  Sit to stand with blue band at thighs Added 10 lbs x 10 sit to stand   [facility] Adult PT Treatment:                                                DATE: 08/05/23  Self Care: HEP issues and explained, rationale Eval findings Differential diagnosis         Ice or heat for pain relief Sleeping positions for pain relief                                                                                                                           PATIENT EDUCATION:  Education details: self care see above  Person educated: Patient Education method: Explanation, Demonstration, and Handouts Education comprehension: verbalized understanding and needs further education  HOME EXERCISE PROGRAM: Access Code: [email or web address] URL: [email or web address] Date: 08/05/2023 Prepared by: Marci Setter  Exercises - Sidelying Hip Abduction  - 1 x daily - 7 x weekly - 2 sets - 10 reps - 5 hold - Supine Bridge  - 1 x daily - 7 x weekly - 2 sets - 10 reps - 5 hold - Supine Piriformis Stretch with Foot on Ground  - 1 x daily - 7 x weekly - 1 sets - 3-5 reps - 30 hold -Hamstring and ITB    ASSESSMENT:  CLINICAL IMPRESSION:  Patient was encouraged to perform his HEP and added on stretches for hamstrings.  He tolerated well without increased pain.  HIp abduction is weak bilaterally yet he could easily do sit to stand without increased pain .  Cont POC.     OBJECTIVE IMPAIRMENTS: decreased balance, decreased mobility, difficulty walking, decreased ROM, decreased strength, hypomobility,  increased fascial restrictions, impaired flexibility, and pain.   ACTIVITY LIMITATIONS: squatting, sleeping, bed mobility, and locomotion level  PARTICIPATION LIMITATIONS: community activity  PERSONAL FACTORS: Time since onset of injury/illness/exacerbation and 1-2 comorbidities: chronic back pain, previous neck surgery  are also affecting patient's functional outcome.   REHAB POTENTIAL: Good  CLINICAL DECISION MAKING: Evolving/moderate complexity  EVALUATION COMPLEXITY: Moderate   GOALS: Goals reviewed with patient?  Yes  SHORT TERM GOALS= LONG TERM GOALS: Target date: 09/16/2023    Pt will be I with final HEP for hips, core , trunk mobility  Baseline: Goal status: INITIAL  2.  Patient will be able to demonstrate proper posture and lifting techniques related to spine health and reduction of symptoms.   Baseline:  Goal status: INITIAL  3.  Patient will be able to stand/walk for 30 min without increasing pain in L hip or back.  Baseline:  Goal status: INITIAL  4.  Pt will be able to Sleep on L side for a small portion of the night with no difficulty  Baseline: unable  Goal status: INITIAL  5.  LEFS score will improve by 16 points  Baseline: 32/80 Goal status: INITIAL  6.  Pt will be able to cross his L leg over the Rt in sitting in order to tie his shoes, min difficulty  Baseline: mod  Goal status: INITIAL   PLAN:  PT FREQUENCY: 1-2x/week  PT DURATION: 6 weeks  PLANNED INTERVENTIONS: 97164- PT Re-evaluation, 97750- Physical Performance Testing, 97110-Therapeutic exercises, 97530- Therapeutic activity, [id number]- Neuromuscular re-education, 97535- Self Care, 19147- Manual therapy, Patient/Family education, Balance training, Dry Needling, Joint mobilization, Cryotherapy, and Moist heat.  PLAN FOR NEXT SESSION: check HEP, Nustep, stretch hips, ITB, core stability    Gottfried Standish, PT 08/13/2023, 10:20 AM   Marci Setter, PT 08/13/23 10:20 AM Phone: [phone] Fax:  [phone]

## 2023-08-13 ENCOUNTER — Ambulatory Visit: Admitting: Physical Therapy

## 2023-08-13 DIAGNOSIS — M25552 Pain in left hip: Secondary | ICD-10-CM | POA: Diagnosis not present

## 2023-08-13 DIAGNOSIS — M25652 Stiffness of left hip, not elsewhere classified: Secondary | ICD-10-CM

## 2023-08-24 ENCOUNTER — Ambulatory Visit: Attending: Nurse Practitioner | Admitting: Physical Therapy

## 2023-08-24 ENCOUNTER — Encounter: Payer: Self-pay | Admitting: Physical Therapy

## 2023-08-24 DIAGNOSIS — M25552 Pain in left hip: Secondary | ICD-10-CM | POA: Diagnosis present

## 2023-08-24 DIAGNOSIS — M25652 Stiffness of left hip, not elsewhere classified: Secondary | ICD-10-CM | POA: Insufficient documentation

## 2023-08-24 NOTE — Therapy (Signed)
 OUTPATIENT PHYSICAL THERAPY NOTE    Patient Name: Shaun Lang MRN: [id number] DOB:06/23/1955, 68 y.o., male Today's Date: 08/24/2023  END OF SESSION:  PT End of Session - 08/24/23 1150     Visit Number 3    Number of Visits 12    Date for PT Re-Evaluation 09/16/23    Authorization Type [employer] Dual Complete MCR and MCD    PT Start Time 1148    PT Stop Time 1226    PT Time Calculation (min) 38 min    Activity Tolerance Patient tolerated treatment well    Behavior During Therapy WFL for tasks assessed/performed               Past Medical History:  Diagnosis Date   Bell's palsy 06/2008   Chest pain with low risk for cardiac etiology    Low Risk Myoview Sept 2013   DJD (degenerative joint disease)    disabilty secondary to neck issues   Excessive daytime sleepiness 09/11/2021   Hyperlipidemia    Hypertension    Ilioinguinal neuralgia 06/01/2012   Sleep apnea    does not use CPAP   Smoker    Past Surgical History:  Procedure Laterality Date   BUNIONECTOMY Left 07/15/2017   with big toe fracture repair   COLONOSCOPY  07/18/2012   polyps-Dr.Outlaw   HERNIA REPAIR  04/1998   [facility]   NECK SURGERY  07/21/02 and 01/2006   WRIST SURGERY  1992   right   Patient Active Problem List   Diagnosis Date Noted   Chronic left hip pain 07/21/2023   Onychomycosis 07/17/2022   Hyperglycemia 01/18/2022   Other pancytopenia (HCC) 10/02/2021   Persistent circadian rhythm sleep disorder, shift work type 09/11/2021   Other male erectile dysfunction 07/14/2021   Encounter for screening for lung cancer 10/18/2019   Chronic pain syndrome 09/29/2017   Lumbosacral spondylosis without myelopathy 09/29/2017   OSA (obstructive sleep apnea) 06/21/2017   Myofascial pain 02/16/2017   Insomnia 10/14/2016   Rotator cuff syndrome of left shoulder 01/30/2015   Arthritis of left acromioclavicular joint 01/30/2015   Knee pain, bilateral 06/05/2014   Right rotator cuff tendinitis 07/07/2013    Chest pain with low risk for cardiac etiology    Hypertension    Hyperlipidemia    DJD (degenerative joint disease)    Current every day nicotine  vapor product user    Bell's palsy 06/21/2008    PCP: Kandace Organ NP  REFERRING PROVIDER: Kandace Organ, NP  REFERRING DIAG: [phone] (ICD-10-CM) - Chronic left hip pain  Rationale for Evaluation and Treatment: Rehabilitation  THERAPY DIAG:  Pain in left hip  Stiffness of left hip, not elsewhere classified  ONSET DATE: 2012  SUBJECTIVE:  SUBJECTIVE STATEMENT: Pt back from his trip. Did some of his stretchess.  Back pain 5/10.  No hip pain . [facility]  Pt presents with burning pain in L lateral hip. He has difficulty walking, lying on his Lt side. The [name] has always been weaker. L foot stays cold.  Denies numbness and tingling .  Chronic back pain as well.  He currently goes to the [facility], walking, lifting and pain is not affected by this.   PERTINENT HISTORY:  Had eval by ortho 2020 and 2022 2020 and 2022 X-ray indicates DJD, 2020 pelvic US  was normal Intra articular corticosteroid administered by ortho in 2020: he reports no improvement. Current use of gabapentin  at hs with some improvement Pain is worse in lateral position at bedtime and prolong walking. No paresthesia, no weakness   Advised to maintain current med doses Entered outpatient PT referral Ref to ortho if no improvement    PAIN:  Are you having pain? Yes: NPRS scale: 6/10 Pain location: L back lateral hip  Pain description: irritation, burning  Aggravating factors: laying down, activity does not really  Relieving factors: walking, not sure   PRECAUTIONS: None  RED FLAGS: None   WEIGHT BEARING RESTRICTIONS: No  FALLS:  Has patient  fallen in last 6 months? Yes. Number of falls 1, slipped on step  LIVING ENVIRONMENT: Lives with: lives alone Lives in: House/apartment Stairs: Yes: External: 34 steps; on right going up, no trouble lately Has following equipment at home: None  OCCUPATION: not working   PLOF: Independent, Independent with household mobility without device, Independent with community mobility without device, Vocation/Vocational requirements: not working , and Leisure: cards, chess   PATIENT GOALS: Make it go away   NEXT MD VISIT: not sure   OBJECTIVE:  Note: Objective measures were completed at Evaluation unless otherwise noted.  DIAGNOSTIC FINDINGS:  No hip imaging recent   PATIENT SURVEYS:  LEFS 32/80  COGNITION: Overall cognitive status: Within functional limits for tasks assessed     SENSATION: WNL   MUSCLE LENGTH: Hamstrings: tight  Thomas test:tight   POSTURE: rounded shoulders, forward head, and genu varus   PALPATION: Pain along Lt Gr. Trochanter, TTP L glute med.  LUMBAR ROM:   AROM eval  Flexion WNL   Extension Pain in back   Right lateral flexion   Left lateral flexion   Right rotation stiff  Left rotation Stiff    (Blank rows = not tested)  LOWER EXTREMITY ROM:     Active  Right eval Left eval  Hip flexion Full  Full   Hip extension    Hip abduction    Hip adduction    Hip internal rotation 25 25  Hip external rotation 45 35  Knee flexion Full  Full   Knee extension    Ankle dorsiflexion    Ankle plantarflexion    Ankle inversion    Ankle eversion     (Blank rows = not tested)  LOWER EXTREMITY MMT:    MMT Right eval Left eval  Hip flexion 5 4  Hip extension 4 3  Hip abduction 4 3  Hip adduction    Hip internal rotation    Hip external rotation    Knee flexion 5 5  Knee extension 5 5  Ankle dorsiflexion    Ankle plantarflexion    Ankle inversion    Ankle eversion     (Blank rows = not tested)    GAIT: Distance walked: 150  Assistive  device utilized: None Level of assistance: Complete Independence Comments: slower pace   TREATMENT DATE:   [facility] Adult PT Treatment:                                                DATE: 08/24/23 Therapeutic Activity: Supine piriformis stretch  LTR feet wide  x 10  Bridge x 10  Butterfly stretch  Single leg bridge x 10  Hamstring with strap 3 x 30 sec  Quadruped bird dog x 5  Wall sit 30 sec added chest press , 10 lbs Standing core march 10 lbs  Squat 15 lbs for form Hip hinge 15  KB X 10  15 lbs KB high knee march x 45 sec each side  Semi tandem with DB pass 10 lbs  Hip abduction and high knee with 1 foot on Airex pad    [facility] Adult PT Treatment:                                                DATE: 08/13/23 Therapeutic Exercise: Nustep LE only L6 for 6 min   Therapeutic Activity: Seated piriformis Hamstring stretch  Knee to chest Lower trunk rotation, added legs crossed  Bridge x 10  Hamstring with strap, ITB with strap 30 sec  SL hip abduction 2 x 10 Standing L stretch at the bar Deep supported squat  Sit to stand with blue band at thighs Added 10 lbs x 10 sit to stand   [facility] Adult PT Treatment:                                                DATE: 08/05/23  Self Care: HEP issues and explained, rationale Eval findings Differential diagnosis         Ice or heat for pain relief Sleeping positions for pain relief                                                                                                                           PATIENT EDUCATION:  Education details: self care see above  Person educated: Patient Education method: Explanation, Demonstration, and Handouts Education comprehension: verbalized understanding and needs further education  HOME EXERCISE PROGRAM: Access Code: [email or web address] URL: [email or web address] Date: 08/05/2023 Prepared by: Marci Setter  Exercises - Sidelying Hip Abduction  - 1 x daily - 7 x weekly - 2 sets - 10 reps -  5 hold - Supine Bridge  - 1 x daily - 7 x weekly - 2 sets - 10 reps - 5 hold - Supine Piriformis  Stretch with Foot on Ground  - 1 x daily - 7 x weekly - 1 sets - 3-5 reps - 30 hold -Hamstring and ITB    ASSESSMENT:  CLINICAL IMPRESSION: Patient cont to have back and L hip stiffness, reports going to the gym but only stretching a bit. Pt needs heavy cues for performing his stretches  and exercise in general. He wants to be able to do KB exercises but clearly not ready due to lack of motor planning.   He has difficulty with quadruped bracing for bird dog.  Emphasized trunk stability with standing core in neutral using dumbbells. He would like to continue PT, will use weight machines for education about gym exercises and form.    OBJECTIVE IMPAIRMENTS: decreased balance, decreased mobility, difficulty walking, decreased ROM, decreased strength, hypomobility, increased fascial restrictions, impaired flexibility, and pain.   ACTIVITY LIMITATIONS: squatting, sleeping, bed mobility, and locomotion level  PARTICIPATION LIMITATIONS: community activity  PERSONAL FACTORS: Time since onset of injury/illness/exacerbation and 1-2 comorbidities: chronic back pain, previous neck surgery  are also affecting patient's functional outcome.   REHAB POTENTIAL: Good  CLINICAL DECISION MAKING: Evolving/moderate complexity  EVALUATION COMPLEXITY: Moderate   GOALS: Goals reviewed with patient? Yes  SHORT TERM GOALS= LONG TERM GOALS: Target date: 09/16/2023    Pt will be I with final HEP for hips, core , trunk mobility  Baseline: Goal status: INITIAL  2.  Patient will be able to demonstrate proper posture and lifting techniques related to spine health and reduction of symptoms.   Baseline:  Goal status: INITIAL  3.  Patient will be able to stand/walk for 30 min without increasing pain in L hip or back.  Baseline:  Goal status: INITIAL  4.  Pt will be able to Sleep on L side for a small portion of the  night with no difficulty  Baseline: unable  Goal status: INITIAL  5.  LEFS score will improve by 16 points  Baseline: 32/80 Goal status: INITIAL  6.  Pt will be able to cross his L leg over the Rt in sitting in order to tie his shoes, min difficulty  Baseline: mod  Goal status: INITIAL   PLAN:  PT FREQUENCY: 1-2x/week  PT DURATION: 6 weeks  PLANNED INTERVENTIONS: 97164- PT Re-evaluation, 97750- Physical Performance Testing, 97110-Therapeutic exercises, 97530- Therapeutic activity, [id number]- Neuromuscular re-education, 97535- Self Care, 62952- Manual therapy, Patient/Family education, Balance training, Dry Needling, Joint mobilization, Cryotherapy, and Moist heat.  PLAN FOR NEXT SESSION: Gym equipment exercises . check HEP, Nustep, stretch hips, ITB, core stability    Deroy Noah, PT 08/24/2023, 12:27 PM   Marci Setter, PT 08/24/23 12:27 PM Phone: [phone] Fax: [phone]

## 2023-08-30 NOTE — Therapy (Unsigned)
 OUTPATIENT PHYSICAL THERAPY NOTE  DISCHARGE   Patient Name: Shaun Lang MRN: [id number] DOB:07/18/1955, 68 y.o., male Today's Date: 08/31/2023   PHYSICAL THERAPY DISCHARGE SUMMARY  Visits from Start of Care: 4  Current functional level related to goals / functional outcomes: See below   Remaining deficits: Left hip pain and inability to lie on the side of pain at night for too long   Education / Equipment: Stretching and mobility home program, gym exercises, safety with lifting and transferring, safety with weights  Patient agrees to discharge. Patient goals were partially met. Patient is being discharged due to being pleased with the current functional level.  END OF SESSION:  PT End of Session - 08/31/23 1104     Visit Number 4    Number of Visits 12    Date for PT Re-Evaluation 09/16/23    Authorization Type [employer] Dual Complete MCR and MCD    PT Start Time 1105    PT Stop Time 1145    PT Time Calculation (min) 40 min    Activity Tolerance Patient tolerated treatment well    Behavior During Therapy WFL for tasks assessed/performed                Past Medical History:  Diagnosis Date   Bell's palsy 06/2008   Chest pain with low risk for cardiac etiology    Low Risk Myoview Sept 2013   DJD (degenerative joint disease)    disabilty secondary to neck issues   Excessive daytime sleepiness 09/11/2021   Hyperlipidemia    Hypertension    Ilioinguinal neuralgia 06/01/2012   Sleep apnea    does not use CPAP   Smoker    Past Surgical History:  Procedure Laterality Date   BUNIONECTOMY Left 07/15/2017   with big toe fracture repair   COLONOSCOPY  07/18/2012   polyps-Dr.Outlaw   HERNIA REPAIR  04/1998   [facility]   NECK SURGERY  07/21/02 and 01/2006   WRIST SURGERY  1992   right   Patient Active Problem List   Diagnosis Date Noted   Chronic left hip pain 07/21/2023   Onychomycosis 07/17/2022   Hyperglycemia 01/18/2022   Other pancytopenia (HCC) 10/02/2021    Persistent circadian rhythm sleep disorder, shift work type 09/11/2021   Other male erectile dysfunction 07/14/2021   Encounter for screening for lung cancer 10/18/2019   Chronic pain syndrome 09/29/2017   Lumbosacral spondylosis without myelopathy 09/29/2017   OSA (obstructive sleep apnea) 06/21/2017   Myofascial pain 02/16/2017   Insomnia 10/14/2016   Rotator cuff syndrome of left shoulder 01/30/2015   Arthritis of left acromioclavicular joint 01/30/2015   Knee pain, bilateral 06/05/2014   Right rotator cuff tendinitis 07/07/2013   Chest pain with low risk for cardiac etiology    Hypertension    Hyperlipidemia    DJD (degenerative joint disease)    Current every day nicotine  vapor product user    Bell's palsy 06/21/2008    PCP: Kandace Organ NP  REFERRING PROVIDER: Kandace Organ, NP  REFERRING DIAG: [phone] (ICD-10-CM) - Chronic left hip pain  Rationale for Evaluation and Treatment: Rehabilitation  THERAPY DIAG:  Pain in left hip  Stiffness of left hip, not elsewhere classified  ONSET DATE: 2012  SUBJECTIVE:  SUBJECTIVE STATEMENT: I'm a little stiff today.  [facility] No pain in hip today.   Pt presents with burning pain in L lateral hip. He has difficulty walking, lying on his Lt side. The [name] has always been weaker. L foot stays cold.  Denies numbness and tingling .  Chronic back pain as well.  He currently goes to [facility] walking, lifting and pain is not affected by this.   PERTINENT HISTORY:  Had eval by ortho 2020 and 2022 2020 and 2022 X-ray indicates DJD, 2020 pelvic US  was normal Intra articular corticosteroid administered by ortho in 2020: he reports no improvement. Current use of gabapentin  at hs with some improvement Pain is worse in lateral  position at bedtime and prolong walking. No paresthesia, no weakness   Advised to maintain current med doses Entered outpatient PT referral Ref to ortho if no improvement    PAIN:  Are you having pain? Yes: NPRS scale: 6/10 Pain location: L back lateral hip  Pain description: irritation, burning  Aggravating factors: laying down, activity does not really  Relieving factors: walking, not sure   PRECAUTIONS: None  RED FLAGS: None   WEIGHT BEARING RESTRICTIONS: No  FALLS:  Has patient fallen in last 6 months? Yes. Number of falls 1, slipped on step  LIVING ENVIRONMENT: Lives with: lives alone Lives in: House/apartment Stairs: Yes: External: 34 steps; on right going up, no trouble lately Has following equipment at home: None  OCCUPATION: not working   PLOF: Independent, Independent with household mobility without device, Independent with community mobility without device, Vocation/Vocational requirements: not working , and Leisure: cards, chess   PATIENT GOALS: Make it go away   NEXT MD VISIT: not sure   OBJECTIVE:  Note: Objective measures were completed at Evaluation unless otherwise noted.  DIAGNOSTIC FINDINGS:  No hip imaging recent   PATIENT SURVEYS:  LEFS 32/80  COGNITION: Overall cognitive status: Within functional limits for tasks assessed     SENSATION: WNL   MUSCLE LENGTH: Hamstrings: tight  Thomas test:tight   POSTURE: rounded shoulders, forward head, and genu varus   PALPATION: Pain along Lt Gr. Trochanter, TTP L glute med.  LUMBAR ROM:   AROM eval  Flexion WNL   Extension Pain in back   Right lateral flexion   Left lateral flexion   Right rotation stiff  Left rotation Stiff    (Blank rows = not tested)  LOWER EXTREMITY ROM:     Active  Right eval Left eval  Hip flexion Full  Full   Hip extension    Hip abduction    Hip adduction    Hip internal rotation 25 25  Hip external rotation 45 35  Knee flexion Full  Full   Knee  extension    Ankle dorsiflexion    Ankle plantarflexion    Ankle inversion    Ankle eversion     (Blank rows = not tested)  LOWER EXTREMITY MMT:    MMT Right eval Left eval  Hip flexion 5 4  Hip extension 4 3  Hip abduction 4 3  Hip adduction    Hip internal rotation    Hip external rotation    Knee flexion 5 5  Knee extension 5 5  Ankle dorsiflexion    Ankle plantarflexion    Ankle inversion    Ankle eversion     (Blank rows = not tested)    GAIT: Distance walked: 150 Assistive device utilized: None Level of  assistance: Complete Independence Comments: slower pace   TREATMENT DATE:   [facility] Adult PT Treatment:                                                DATE: 08/31/23 Therapeutic Exercise: TM 8 % grade , 3.4 mph for 6 min  Used gym machines in order to transition to lifting at the gym Leg press 3 plates double leg x 15, 2 sets,  Calf raises 3 plates double x 10  Knee ext 35 lbs double leg x 15, single leg x 10 each with varied plates  Hamstring curl 35 lbs x 15  Row 45 lbs  2 sets x 12  Self Care: Concept of progressive overload and set/rep scheme Stretching routine , after walking   [facility] Adult PT Treatment:                                                DATE: 08/24/23 Therapeutic Activity: Supine piriformis stretch  LTR feet wide  x 10  Bridge x 10  Butterfly stretch  Single leg bridge x 10  Hamstring with strap 3 x 30 sec  Quadruped bird dog x 5  Wall sit 30 sec added chest press , 10 lbs Standing core march 10 lbs  Squat 15 lbs for form Hip hinge 15  KB X 10  15 lbs KB high knee march x 45 sec each side  Semi tandem with DB pass 10 lbs  Hip abduction and high knee with 1 foot on Airex pad    [facility] Adult PT Treatment:                                                DATE: 08/13/23 Therapeutic Exercise: Nustep LE only L6 for 6 min   Therapeutic Activity: Seated piriformis Hamstring stretch  Knee to chest Lower trunk rotation, added legs crossed   Bridge x 10  Hamstring with strap, ITB with strap 30 sec  SL hip abduction 2 x 10 Standing L stretch at the bar Deep supported squat  Sit to stand with blue band at thighs Added 10 lbs x 10 sit to stand   [facility] Adult PT Treatment:                                                DATE: 08/05/23  Self Care: HEP issues and explained, rationale Eval findings Differential diagnosis         Ice or heat for pain relief Sleeping positions for pain relief  PATIENT EDUCATION:  Education details: self care-see above  Person educated: Patient Education method: Explanation, Demonstration, and Handouts Education comprehension: verbalized understanding and needs further education  HOME EXERCISE PROGRAM: Access Code: [email or web address] URL: [email or web address] Date: 08/05/2023 Prepared by: Marci Setter  Exercises - Sidelying Hip Abduction  - 1 x daily - 7 x weekly - 2 sets - 10 reps - 5 hold - Supine Bridge  - 1 x daily - 7 x weekly - 2 sets - 10 reps - 5 hold - Supine Piriformis Stretch with Foot on Ground  - 1 x daily - 7 x weekly - 1 sets - 3-5 reps - 30 hold -Hamstring and ITB    ASSESSMENT:  CLINICAL IMPRESSION:  Patient has a good HEP routine and gym routine which he feels is working for him.  He cont to have stiffness in his back and L hip but understands how to address it at home. He was educated on safe form and body mechanics for lifting and transitioning from bed/sit, etc.  He is discharged from PT at this time.    OBJECTIVE IMPAIRMENTS: decreased balance, decreased mobility, difficulty walking, decreased ROM, decreased strength, hypomobility, increased fascial restrictions, impaired flexibility, and pain.   ACTIVITY LIMITATIONS: squatting, sleeping, bed mobility, and locomotion level  PARTICIPATION LIMITATIONS: community activity  PERSONAL FACTORS:  Time since onset of injury/illness/exacerbation and 1-2 comorbidities: chronic back pain, previous neck surgery  are also affecting patient's functional outcome.   REHAB POTENTIAL: Good  CLINICAL DECISION MAKING: Evolving/moderate complexity  EVALUATION COMPLEXITY: Moderate   GOALS: Goals reviewed with patient? Yes  SHORT TERM GOALS= LONG TERM GOALS: Target date: 09/16/2023    Pt will be I with final HEP for hips, core, trunk mobility  Baseline: Goal status: met  2.  Patient will be able to demonstrate proper posture and lifting techniques related to spine health and reduction of symptoms.   Baseline:  Goal status: met   3.  Patient will be able to stand/walk for 30 min without increasing pain in L hip or back.  Baseline:  Goal status: met   4.  Pt will be able to Sleep on L side for a small portion of the night with no difficulty  Baseline: unable, update: can do this a little bit  Goal status: partially met   5.  LEFS score will improve by 16 points  Baseline: 32/80 Goal status: 37/80 NOT MET , 5 points better  6.  Pt will be able to cross his L leg over the Rt in sitting in order to tie his shoes, min difficulty  Baseline: mod  Goal status: partial met, can do this with mod diff but a bit easier than previous    PLAN:  PT FREQUENCY: 1-2x/week  PT DURATION: 6 weeks  PLANNED INTERVENTIONS: 97164- PT Re-evaluation, 97750- Physical Performance Testing, 97110-Therapeutic exercises, 97530- Therapeutic activity, 97112- Neuromuscular re-education, 97535- Self Care, 16109- Manual therapy, Patient/Family education, Balance training, Dry Needling, Joint mobilization, Cryotherapy, and Moist heat.  PLAN FOR NEXT SESSION: Discharged from physical therapy  Alaijah Gibler, PT 08/31/2023, 11:54 AM   Marci Setter, PT 08/31/23 11:54 AM Phone: [phone] Fax: [phone]

## 2023-08-31 ENCOUNTER — Ambulatory Visit: Admitting: Physical Therapy

## 2023-08-31 DIAGNOSIS — M25552 Pain in left hip: Secondary | ICD-10-CM | POA: Diagnosis not present

## 2023-08-31 DIAGNOSIS — M25652 Stiffness of left hip, not elsewhere classified: Secondary | ICD-10-CM

## 2023-09-01 ENCOUNTER — Ambulatory Visit
Admission: RE | Admit: 2023-09-01 | Discharge: 2023-09-01 | Disposition: A | Discharge status: Home / Self Care | Source: Ambulatory Visit | Attending: Nurse Practitioner | Admitting: Nurse Practitioner

## 2023-09-01 DIAGNOSIS — Z122 Encounter for screening for malignant neoplasm of respiratory organs: Secondary | ICD-10-CM

## 2023-09-01 DIAGNOSIS — Z720 Tobacco use: Secondary | ICD-10-CM

## 2023-09-17 ENCOUNTER — Ambulatory Visit: Payer: Self-pay | Admitting: Nurse Practitioner

## 2023-09-17 ENCOUNTER — Encounter: Attending: Physical Medicine & Rehabilitation | Admitting: Registered Nurse

## 2023-09-17 VITALS — BP 124/79 | HR 67 | Ht 68.0 in | Wt 191.0 lb

## 2023-09-17 DIAGNOSIS — M7062 Trochanteric bursitis, left hip: Secondary | ICD-10-CM | POA: Insufficient documentation

## 2023-09-17 DIAGNOSIS — Z5181 Encounter for therapeutic drug level monitoring: Secondary | ICD-10-CM | POA: Diagnosis present

## 2023-09-17 DIAGNOSIS — M47817 Spondylosis without myelopathy or radiculopathy, lumbosacral region: Secondary | ICD-10-CM | POA: Diagnosis present

## 2023-09-17 DIAGNOSIS — Z79891 Long term (current) use of opiate analgesic: Secondary | ICD-10-CM | POA: Diagnosis present

## 2023-09-17 DIAGNOSIS — G894 Chronic pain syndrome: Secondary | ICD-10-CM | POA: Insufficient documentation

## 2023-09-17 DIAGNOSIS — M5416 Radiculopathy, lumbar region: Secondary | ICD-10-CM | POA: Diagnosis present

## 2023-09-17 MED ORDER — HYDROCODONE-ACETAMINOPHEN 10-325 MG PO TABS: 1.0000 | ORAL_TABLET | Freq: Two times a day (BID) | ORAL | 0 refills | Status: DC | PRN

## 2023-09-17 NOTE — Progress Notes (Signed)
 Subjective:    Patient ID: Shaun Lang, male    DOB: [birth year], 68 y.o.   MRN: [id number]  HPI: Shaun Lang is a 68 y.o. male who returns for follow up appointment for chronic pain and medication refill. He states his pain is located in his lower back reports it occasionally radiating into his left lower extremity. Also reports left hip pain. He rates his pain 6. His current exercise regime is walking and performing stretching exercises.  Shaun Lang Morphine equivalent is 19.64 MME.   Last UDS was Performed on 05/26/2023, it was consistent.     Pain Inventory Average Pain 8 Pain Right Now 6 My pain is sharp and aching  In the last 24 hours, has pain interfered with the following? General activity 7 Relation with others 7 Enjoyment of life 7 What TIME of day is your pain at its worst? evening and night Sleep (in general) Poor  Pain is worse with: bending, sitting, standing, and some activites Pain improves with: pacing activities and medication Relief from Meds: 6  Family History  Problem Relation Age of Onset   Diabetes Mother    Cancer Sister        breast   Diabetes Sister    Stroke Sister 57   Breast cancer Sister    Colon cancer Neg Hx    Esophageal cancer Neg Hx    Rectal cancer Neg Hx    Stomach cancer Neg Hx    Social History   Socioeconomic History   Marital status: Single    Spouse name: Not on file   Number of children: Not on file   Years of education: Not on file   Highest education level: Not on file  Occupational History    Comment: unemployed  Tobacco Use   Smoking status: Former    Average packs/day: 1 pack/day for 57.8 years (57.8 ttl pk-yrs)    Types: Cigarettes    Start date: 03/24/1965   Smokeless tobacco: Never  Vaping Use   Vaping status: Every Day   Substances: Nicotine , Flavoring  Substance and Sexual Activity   Alcohol  use: Yes    Comment: occassionally   Drug use: No   Sexual activity: Yes    Birth control/protection: None   Other Topics Concern   Not on file  Social History Narrative   Not on file   Social Drivers of Health   Financial Resource Strain: Low Risk  (09/18/2022)   Overall Financial Resource Strain (CARDIA)    Difficulty of Paying Living Expenses: Not hard at all  Food Insecurity: No Food Insecurity (09/18/2022)   Hunger Vital Sign    Worried About Running Out of Food in the Last Year: Never true    Ran Out of Food in the Last Year: Never true  Transportation Needs: No Transportation Needs (09/18/2022)   PRAPARE - Administrator, Civil Service (Medical): No    Lack of Transportation (Non-Medical): No  Physical Activity: Sufficiently Active (09/18/2022)   Exercise Vital Sign    Days of Exercise per Week: 7 days    Minutes of Exercise per Session: 40 min  Recent Concern: Physical Activity - Inactive (09/18/2022)   Exercise Vital Sign    Days of Exercise per Week: 0 days    Minutes of Exercise per Session: 0 min  Stress: No Stress Concern Present (09/18/2022)   [employer] of Occupational Health - Occupational Stress Questionnaire    Feeling of Stress : Not at  all  Social Connections: Socially Isolated (10/07/2021)   Social Connection and Isolation Panel    Frequency of Communication with Friends and Family: Three times a week    Frequency of Social Gatherings with Friends and Family: Three times a week    Attends Religious Services: Never    Active Member of Clubs or Organizations: No    Attends Banker Meetings: Never    Marital Status: Divorced   Past Surgical History:  Procedure Laterality Date   BUNIONECTOMY Left 07/15/2017   with big toe fracture repair   COLONOSCOPY  07/18/2012   polyps-Dr.Outlaw   HERNIA REPAIR  04/1998   [facility]   NECK SURGERY  07/21/02 and 01/2006   WRIST SURGERY  1992   right   Past Surgical History:  Procedure Laterality Date   BUNIONECTOMY Left 07/15/2017   with big toe fracture repair   COLONOSCOPY  07/18/2012    polyps-Dr.Outlaw   HERNIA REPAIR  04/1998   [facility]   NECK SURGERY  07/21/02 and 01/2006   WRIST SURGERY  1992   right   Past Medical History:  Diagnosis Date   Bell's palsy 06/2008   Chest pain with low risk for cardiac etiology    Low Risk Myoview Sept 2013   DJD (degenerative joint disease)    disabilty secondary to neck issues   Excessive daytime sleepiness 09/11/2021   Hyperlipidemia    Hypertension    Ilioinguinal neuralgia 06/01/2012   Sleep apnea    does not use CPAP   Smoker    BP 124/79   Pulse 67   Ht 5' 8 (1.727 m)   Wt 191 lb (86.6 kg)   SpO2 97%   BMI 29.04 kg/m   Opioid Risk Score:   Fall Risk Score:  `1  Depression screen [facility] 2/9     07/22/2023    9:35 AM 07/21/2023    1:05 PM 05/26/2023    9:22 AM 03/31/2023    9:07 AM 01/19/2023   11:40 AM 12/09/2022   11:39 AM 09/18/2022   11:50 AM  Depression screen PHQ 2/9  Decreased Interest 0 0 0 0 0 0 0  Down, Depressed, Hopeless 0 0 0 0 0 0 0  PHQ - 2 Score 0 0 0 0 0 0 0     Review of Systems  Musculoskeletal:  Positive for back pain.       Objective:   Physical Exam Vitals and nursing note reviewed.  Constitutional:      Appearance: Normal appearance.  Cardiovascular:     Rate and Rhythm: Normal rate and regular rhythm.     Pulses: Normal pulses.     Heart sounds: Normal heart sounds.  Pulmonary:     Effort: Pulmonary effort is normal.     Breath sounds: Normal breath sounds.  Musculoskeletal:     Comments: Normal Muscle Bulk and Muscle Testing Reveals:  Upper Extremities:Full  ROM and Muscle Strength  5/5  Lumbar Paraspinal Tenderness: L-4-L-5 Lower Extremities: Full ROM and Muscle Strength 5/5 Arises from Chair slowly Narrow Based  Gait     Skin:    General: Skin is warm and dry.  Neurological:     Mental Status: He is alert and oriented to person, place, and time.  Psychiatric:        Mood and Affect: Mood normal.        Behavior: Behavior normal.          Assessment & Plan:  1.  Cervicalgia: S/P Fall: No Falls this month. No complaints today. Continue HEP as Tolerated. Continue to Monitor.  Continue to Monitor. 09/17/2023 2. Right Elbow Pain: No complaints today.Ortho Following. Continue to Monitor. 09/17/2023 3.Chronic left inguinal pain: No complaints today. S/P inguinal hernia (indirect) and mesh repair. Continue to Monitor. 06/27//2025. Refilled: Hydrocodone  10/325 mg one tablet  twice a day  as needed #55.Second script sent for the following month. We will continue the opioid monitoring program, this consists of regular clinic visits, examinations, urine drug screen, pill counts as well as use of [place]  Controlled Substance Reporting system. A 12 month History has been reviewed on the [place]  Controlled Substance Reporting System on 09/17/2023.  4. Lumbosacral Spondylosis: Continue with Home Exercise regime. Continue to Monitor. 09/17/2023 5. Muscle Spasm: Continue current medication regimen with Flexeril . Continue to monitor.09/17/2023 6..Chronic Right Shoulder Pain :No complaints today.  Chronic Pain Syndrome:  Continue HEP as tolerated.  Continue to Monitor. 09/17/2023.  7. Cervicalgia/ Cervical Radiculitis:  No complaints today. Continue HEP as Tolerated. Alternate heat and Ice Therapy as tolerated. Continue current medication regimen. Continue to Monitor. 09/17/2023 8. Left Greater Trochanteric Tenderness:  Alternate with Ice and Heat Therapy. Continue to Monitor. 09/17/2023 9. Left Shoulder Pain: No complaints today. Continue with HEP as tolerated and alternate with Ice  and Heat  Therapy. 09/17/2023 10. Left Radiculitis: Continue  Gabapentin   one capsule twice a day. Continue to Monitor. 09/17/2023    F/U in 2 months

## 2023-09-25 ENCOUNTER — Encounter: Payer: Self-pay | Admitting: Registered Nurse

## 2023-09-29 ENCOUNTER — Ambulatory Visit

## 2023-09-29 VITALS — Ht 68.0 in | Wt 191.0 lb

## 2023-09-29 DIAGNOSIS — Z0000 Encounter for general adult medical examination without abnormal findings: Secondary | ICD-10-CM

## 2023-09-29 NOTE — Progress Notes (Signed)
 Subjective:   Shaun Lang is a 68 y.o. who presents for a Medicare Wellness preventive visit.  As a reminder, Annual Wellness Visits don't include a physical exam, and some assessments may be limited, especially if this visit is performed virtually. We may recommend an in-person follow-up visit with your provider if needed.  Visit Complete: Virtual I connected with  Shaun Lang Key on 09/29/23 by a audio enabled telemedicine application and verified that I am speaking with the correct person using two identifiers.  Patient Location: Home  Provider Location: Home Office  I discussed the limitations of evaluation and management by telemedicine. The patient expressed understanding and agreed to proceed.  Vital Signs: Because this visit was a virtual/telehealth visit, some criteria may be missing or patient reported. Any vitals not documented were not able to be obtained and vitals that have been documented are patient reported.  VideoDeclined- This patient declined Librarian, academic. Therefore the visit was completed with audio only.  Persons Participating in Visit: Patient.  AWV Questionnaire: No: Patient Medicare AWV questionnaire was not completed prior to this visit.  Cardiac Risk Factors include: advanced age (>57men, >82 women);hypertension;male gender;dyslipidemia     Objective:    Today's Vitals   09/29/23 1100  Weight: 191 lb (86.6 kg)  Height: 5' 8 (1.727 m)   Body mass index is 29.04 kg/m.     09/29/2023   11:07 AM 08/05/2023   11:57 AM 09/18/2022   11:50 AM 11/18/2021    7:44 AM 10/07/2021   11:27 AM 10/01/2020    3:04 PM 09/14/2019    1:38 PM  Advanced Directives  Does Patient Have a Medical Advance Directive? No No No No No No No  Would patient like information on creating a medical advance directive? Yes (MAU/Ambulatory/Procedural Areas - Information given) No - Patient declined No - Patient declined No - Patient declined No -  Patient declined No - Patient declined Yes (ED - Information included in AVS)    Current Medications (verified) Outpatient Encounter Medications as of 09/29/2023  Medication Sig   gabapentin  (NEURONTIN ) 300 MG capsule Take 1 capsule (300 mg total) by mouth 2 (two) times daily.   HYDROcodone -acetaminophen  (NORCO) 10-325 MG tablet Take 1 tablet by mouth 2 (two) times daily as needed for moderate pain (pain score 4-6).   lisinopril  (ZESTRIL ) 5 MG tablet Take 1 tablet (5 mg total) by mouth daily.   rosuvastatin  (CRESTOR ) 40 MG tablet Take 1 tablet (40 mg total) by mouth daily.   No facility-administered encounter medications on file as of 09/29/2023.    Allergies (verified) Patient has no known allergies.   History: Past Medical History:  Diagnosis Date   Bell's palsy 06/2008   Chest pain with low risk for cardiac etiology    Low Risk Myoview Sept 2013   DJD (degenerative joint disease)    disabilty secondary to neck issues   Excessive daytime sleepiness 09/11/2021   Hyperlipidemia    Hypertension    Ilioinguinal neuralgia 06/01/2012   Sleep apnea    does not use CPAP   Smoker    Past Surgical History:  Procedure Laterality Date   BUNIONECTOMY Left 07/15/2017   with big toe fracture repair   COLONOSCOPY  07/18/2012   polyps-Dr.Outlaw   [facility]ERNIA REPAIR  04/1998   [facility]   NECK SURGERY  07/21/02 and 01/2006   WRIST SURGERY  1992   right   Family History  Problem Relation Age of Onset  Diabetes Mother    Cancer Sister        breast   Diabetes Sister    Stroke Sister 27   Breast cancer Sister    Colon cancer Neg Hx    Esophageal cancer Neg Hx    Rectal cancer Neg Hx    Stomach cancer Neg Hx    Social History   Socioeconomic History   Marital status: Single    Spouse name: Not on file   Number of children: Not on file   Years of education: Not on file   Highest education level: Not on file  Occupational History    Comment: unemployed  Tobacco Use   Smoking status:  Former    Average packs/day: 1 pack/day for 57.8 years (57.8 ttl pk-yrs)    Types: Cigarettes    Start date: 03/24/1965   Smokeless tobacco: Never  Vaping Use   Vaping status: Every Day   Substances: Nicotine , Flavoring  Substance and Sexual Activity   Alcohol  use: Yes    Comment: occassionally   Drug use: No   Sexual activity: Yes    Birth control/protection: None  Other Topics Concern   Not on file  Social History Narrative   Not on file   Social Drivers of Health   Financial Resource Strain: Low Risk  (09/29/2023)   Overall Financial Resource Strain (CARDIA)    Difficulty of Paying Living Expenses: Not hard at all  Food Insecurity: No Food Insecurity (09/29/2023)   Hunger Vital Sign    Worried About Running Out of Food in the Last Year: Never true    Ran Out of Food in the Last Year: Never true  Transportation Needs: No Transportation Needs (09/29/2023)   PRAPARE - Administrator, Civil Service (Medical): No    Lack of Transportation (Non-Medical): No  Physical Activity: Insufficiently Active (09/29/2023)   Exercise Vital Sign    Days of Exercise per Week: 4 days    Minutes of Exercise per Session: 30 min  Stress: No Stress Concern Present (09/29/2023)   [employer] of Occupational Health - Occupational Stress Questionnaire    Feeling of Stress: Not at all  Social Connections: Socially Isolated (09/29/2023)   Social Connection and Isolation Panel    Frequency of Communication with Friends and Family: Three times a week    Frequency of Social Gatherings with Friends and Family: Three times a week    Attends Religious Services: Never    Active Member of Clubs or Organizations: No    Attends Banker Meetings: Never    Marital Status: Divorced    Tobacco Counseling Counseling given: Not Answered    Clinical Intake:  Pre-visit preparation completed: Yes  Pain : No/denies pain     Diabetes: No  Lab Results  Component Value Date   HGBA1C  6.1 07/21/2023   HGBA1C 6.3 01/20/2023   HGBA1C 6.1 07/17/2022     How often do you need to have someone help you when you read instructions, pamphlets, or other written materials from your doctor or pharmacy?: 1 - Never  Interpreter Needed?: No  Information entered by :: Charmaine Bloodgood LPN   Activities of Daily Living     09/29/2023   11:03 AM  In your present state of health, do you have any difficulty performing the following activities:  Hearing? 0  Vision? 0  Difficulty concentrating or making decisions? 0  Walking or climbing stairs? 0  Dressing or bathing? 0  Doing  errands, shopping? 0  Preparing Food and eating ? N  Using the Toilet? N  In the past six months, have you accidently leaked urine? N  Do you have problems with loss of bowel control? N  Managing your Medications? N  Managing your Finances? N  Housekeeping or managing your Housekeeping? N    Patient Care Team: Nche, Roselie Rockford, NP as PCP - General (Internal Medicine) Debby Fidela CROME, NP as Nurse Practitioner (Physical Medicine and Rehabilitation)  I have updated your Care Teams any recent Medical Services you may have received from other providers in the past year.     Assessment:   This is a routine wellness examination for [facility].  Hearing/Vision screen Hearing Screening - Comments:: Denies hearing difficulties   Vision Screening - Comments:: Wears rx glasses - up to date with routine eye exams with [facility]    Goals Addressed               This Visit's Progress     Patient Stated (pt-stated)   Not on track     Exercise more, quit smoking      Quit Smoking   Not on track      Depression Screen     09/29/2023   11:05 AM 07/22/2023    9:35 AM 07/21/2023    1:05 PM 05/26/2023    9:22 AM 03/31/2023    9:07 AM 01/19/2023   11:40 AM 12/09/2022   11:39 AM  PHQ 2/9 Scores  PHQ - 2 Score 0 0 0 0 0 0 0    Fall Risk     09/29/2023   11:07 AM 09/17/2023    9:39 AM 07/22/2023    9:35 AM  07/21/2023    1:04 PM 05/26/2023    9:22 AM  Fall Risk   Falls in the past year? 0 0 0 0 0  Number falls in past yr: 0  0 0 0  Injury with Fall? 0  0 0 0  Risk for fall due to : No Fall Risks   No Fall Risks   Follow up Falls prevention discussed;Education provided;Falls evaluation completed   Falls evaluation completed     MEDICARE RISK AT HOME:  Medicare Risk at Home Any stairs in or around the home?: No If so, are there any without handrails?: No Home free of loose throw rugs in walkways, pet beds, electrical cords, etc?: Yes Adequate lighting in your home to reduce risk of falls?: Yes Life alert?: No Use of a cane, walker or w/c?: No Grab bars in the bathroom?: Yes Shower chair or bench in shower?: No Elevated toilet seat or a handicapped toilet?: No  TIMED UP AND GO:  Was the test performed?  No  Cognitive Function: 6CIT completed        09/29/2023   11:08 AM 09/18/2022   12:01 PM 09/14/2019    1:45 PM  6CIT Screen  What Year? 0 points 0 points 0 points  What month? 0 points 0 points 0 points  What time? 0 points 0 points 0 points  Count back from 20 0 points 0 points 0 points  Months in reverse 0 points 0 points 0 points  Repeat phrase 0 points 0 points 0 points  Total Score 0 points 0 points 0 points    Immunizations Immunization History  Administered Date(s) Administered   Moderna SARS-COV2 Booster Vaccination 05/22/2020   PFIZER(Purple Top)SARS-COV-2 Vaccination 05/28/2019, 06/25/2019   Tdap 07/17/2022    Screening  Tests Health Maintenance  Topic Date Due   COVID-19 Vaccine (3 - Pfizer risk series) 06/19/2020   Zoster Vaccines- Shingrix (1 of 2) 10/20/2023 (Originally 05/19/1974)   Pneumococcal Vaccine: 50+ Years (1 of 1 - PCV) 07/20/2024 (Originally 05/19/2005)   INFLUENZA VACCINE  10/22/2023   Lung Cancer Screening  08/31/2024   Medicare Annual Wellness (AWV)  09/28/2024   Colonoscopy  11/02/2027   DTaP/Tdap/Td (2 - Td or Tdap) 07/16/2032   Hepatitis  C Screening  Completed   Hepatitis B Vaccines  Aged Out   HPV VACCINES  Aged Out   Meningococcal B Vaccine  Aged Out    Health Maintenance  Health Maintenance Due  Topic Date Due   COVID-19 Vaccine (3 - Pfizer risk series) 06/19/2020   Health Maintenance Items Addressed: Information provided on recommended vaccines   Additional Screening:  Vision Screening: Recommended annual ophthalmology exams for early detection of glaucoma and other disorders of the eye. Would you like a referral to an eye doctor? No    Dental Screening: Recommended annual dental exams for proper oral hygiene  Community Resource Referral / Chronic Care Management: CRR required this visit?  No   CCM required this visit?  No   Plan:    I have personally reviewed and noted the following in the patient's chart:   Medical and social history Use of alcohol , tobacco or illicit drugs  Current medications and supplements including opioid prescriptions. Patient is currently taking opioid prescriptions. Information provided to patient regarding non-opioid alternatives. Patient advised to discuss non-opioid treatment plan with their provider. Functional ability and status Nutritional status Physical activity Advanced directives List of other physicians Hospitalizations, surgeries, and ER visits in previous 12 months Vitals Screenings to include cognitive, depression, and falls Referrals and appointments  In addition, I have reviewed and discussed with patient certain preventive protocols, quality metrics, and best practice recommendations. A written personalized care plan for preventive services as well as general preventive health recommendations were provided to patient.   Lavelle Pfeiffer [place], [place]   2/0/[birth year]74   After Visit Summary: (MyChart) Due to this being a telephonic visit, the after visit summary with patients personalized plan was offered to patient via MyChart   Notes: Nothing significant  to report at this time.

## 2023-09-29 NOTE — Patient Instructions (Signed)
 Shaun Lang , Thank you for taking time out of your busy schedule to complete your Annual Wellness Visit with me. I enjoyed our conversation and look forward to speaking with you again next year. I, as well as your care team,  appreciate your ongoing commitment to your health goals. Please review the following plan we discussed and let me know if I can assist you in the future. Your Game plan/ To Do List     Next Medicare AWV with our clinical staff: In 1 year    Have you seen your provider in the last 6 months (3 months if uncontrolled diabetes)? Yes Next Office Visit with your provider: To be scheduled  Clinician Recommendations:  Aim for 30 minutes of exercise or brisk walking, 6-8 glasses of water, and 5 servings of fruits and vegetables each day.       This is a list of the screening recommended for you and due dates:  Health Maintenance  Topic Date Due   COVID-19 Vaccine (3 - Pfizer risk series) 06/19/2020   Zoster (Shingles) Vaccine (1 of 2) 10/20/2023*   Pneumococcal Vaccine for age over 65 (1 of 1 - PCV) 07/20/2024*   Flu Shot  10/22/2023   Screening for Lung Cancer  08/31/2024   Medicare Annual Wellness Visit  09/28/2024   Colon Cancer Screening  11/02/2027   DTaP/Tdap/Td vaccine (2 - Td or Tdap) 07/16/2032   Hepatitis C Screening  Completed   Hepatitis B Vaccine  Aged Out   HPV Vaccine  Aged Out   Meningitis B Vaccine  Aged Out  *Topic was postponed. The date shown is not the original due date.    Advanced directives: (ACP Link)Information on Advanced Care Planning can be found at [place]  Secretary of [facility] Advance Health Care Directives Advance Health Care Directives. [email or web address]   Advance Care Planning is important because it:  [x]  Makes sure you receive the medical care that is consistent with your values, goals, and preferences  [x]  It provides guidance to your family and loved ones and reduces their decisional burden about whether or not they are making the  right decisions based on your wishes.  Follow the link provided in your after visit summary or read over the paperwork we have mailed to you to help you started getting your Advance Directives in place. If you need assistance in completing these, please reach out to us  so that we can help you!  See attachments for Preventive Care and Fall Prevention Tips.

## 2023-11-18 ENCOUNTER — Encounter: Attending: Physical Medicine & Rehabilitation | Admitting: Registered Nurse

## 2023-11-18 VITALS — BP 137/89 | HR 75 | Ht 68.0 in | Wt 191.0 lb

## 2023-11-18 DIAGNOSIS — M5416 Radiculopathy, lumbar region: Secondary | ICD-10-CM

## 2023-11-18 DIAGNOSIS — M7062 Trochanteric bursitis, left hip: Secondary | ICD-10-CM

## 2023-11-18 DIAGNOSIS — G894 Chronic pain syndrome: Secondary | ICD-10-CM

## 2023-11-18 DIAGNOSIS — M47817 Spondylosis without myelopathy or radiculopathy, lumbosacral region: Secondary | ICD-10-CM | POA: Diagnosis present

## 2023-11-18 DIAGNOSIS — Z5181 Encounter for therapeutic drug level monitoring: Secondary | ICD-10-CM

## 2023-11-18 DIAGNOSIS — Z79891 Long term (current) use of opiate analgesic: Secondary | ICD-10-CM | POA: Diagnosis present

## 2023-11-18 MED ORDER — HYDROCODONE-ACETAMINOPHEN 10-325 MG PO TABS: 1.0000 | ORAL_TABLET | Freq: Two times a day (BID) | ORAL | 0 refills | Status: DC | PRN

## 2023-11-18 MED ORDER — GABAPENTIN 300 MG PO CAPS: 300.0000 mg | ORAL_CAPSULE | Freq: Two times a day (BID) | ORAL | 2 refills | Status: DC

## 2023-11-18 NOTE — Progress Notes (Signed)
 Subjective:    Patient ID: Shaun Lang, male    DOB: 05-Dec-1955, 68 y.o.   MRN: [id number]  HPI: Shaun Lang is a 68 y.o. male who returns for follow up appointment for chronic pain and medication refill. He states his pain is located in his lower back radiating into his left lower extremity. He rates his pain 7.His current exercise regime is walking and performing stretching exercises.  Mr. Kiang Morphine equivalent is 19.64 MME.  Last UDS was Performed 11/18/2023, it was consistent.      Pain Inventory Average Pain 8 Pain Right Now 7 My pain is stabbing, tingling, and aching  In the last 24 hours, has pain interfered with the following? General activity 8 Relation with others 8 Enjoyment of life 8 What TIME of day is your pain at its worst? daytime and evening Sleep (in general) Poor  Pain is worse with: walking, bending, and standing Pain improves with: medication Relief from Meds: 7  Family History  Problem Relation Age of Onset   Diabetes Mother    Cancer Sister        breast   Diabetes Sister    Stroke Sister 59   Breast cancer Sister    Colon cancer Neg Hx    Esophageal cancer Neg Hx    Rectal cancer Neg Hx    Stomach cancer Neg Hx    Social History   Socioeconomic History   Marital status: Single    Spouse name: Not on file   Number of children: Not on file   Years of education: Not on file   Highest education level: Not on file  Occupational History    Comment: unemployed  Tobacco Use   Smoking status: Former    Average packs/day: 1 pack/day for 57.8 years (57.8 ttl pk-yrs)    Types: Cigarettes    Start date: 03/24/1965   Smokeless tobacco: Never  Vaping Use   Vaping status: Every Day   Substances: Nicotine , Flavoring  Substance and Sexual Activity   Alcohol  use: Yes    Comment: occassionally   Drug use: No   Sexual activity: Yes    Birth control/protection: None  Other Topics Concern   Not on file  Social History Narrative   Not on  file   Social Drivers of Health   Financial Resource Strain: Low Risk  (09/29/2023)   Overall Financial Resource Strain (CARDIA)    Difficulty of Paying Living Expenses: Not hard at all  Food Insecurity: No Food Insecurity (09/29/2023)   Hunger Vital Sign    Worried About Running Out of Food in the Last Year: Never true    Ran Out of Food in the Last Year: Never true  Transportation Needs: No Transportation Needs (09/29/2023)   PRAPARE - Administrator, Civil Service (Medical): No    Lack of Transportation (Non-Medical): No  Physical Activity: Insufficiently Active (09/29/2023)   Exercise Vital Sign    Days of Exercise per Week: 4 days    Minutes of Exercise per Session: 30 min  Stress: No Stress Concern Present (09/29/2023)   [employer] of Occupational Health - Occupational Stress Questionnaire    Feeling of Stress: Not at all  Social Connections: Socially Isolated (09/29/2023)   Social Connection and Isolation Panel    Frequency of Communication with Friends and Family: Three times a week    Frequency of Social Gatherings with Friends and Family: Three times a week    Attends Religious  Services: Never    Active Member of Clubs or Organizations: No    Attends Banker Meetings: Never    Marital Status: Divorced   Past Surgical History:  Procedure Laterality Date   BUNIONECTOMY Left 07/15/2017   with big toe fracture repair   COLONOSCOPY  07/18/2012   polyps-Dr.Outlaw   HERNIA REPAIR  04/1998   [facility]   NECK SURGERY  07/21/02 and 01/2006   WRIST SURGERY  1992   right   Past Surgical History:  Procedure Laterality Date   BUNIONECTOMY Left 07/15/2017   with big toe fracture repair   COLONOSCOPY  07/18/2012   polyps-Dr.Outlaw   HERN[facility] REPAIR  04/1998   [facility]   NECK SURGERY  07/21/02 and 01/2006   WRIST SURGERY  1992   right   Past Medical History:  Diagnosis Date   Bell's palsy 06/2008   Chest pain with low risk for cardiac etiology    Low Risk Myoview  Sept 2013   DJD (degenerative joint disease)    disabilty secondary to neck issues   Excessive daytime sleepiness 09/11/2021   Hyperlipidemia    Hypertension    Ilioinguinal neuralgia 06/01/2012   Sleep apnea    does not use CPAP   Smoker    BP 137/89   Pulse 75   Ht 5' 8 (1.727 m)   Wt 191 lb (86.6 kg)   SpO2 98%   BMI 29.04 kg/m   Opioid Risk Score:   Fall Risk Score:  `1  Depression screen [facility] 2/9     09/29/2023   11:05 AM 07/22/2023    9:35 AM 07/21/2023    1:05 PM 05/26/2023    9:22 AM 03/31/2023    9:07 AM 01/19/2023   11:40 AM 12/09/2022   11:39 AM  Depression screen PHQ 2/9  Decreased Interest 0 0 0 0 0 0 0  Down, Depressed, Hopeless 0 0 0 0 0 0 0  PHQ - 2 Score 0 0 0 0 0 0 0     Review of Systems  Musculoskeletal:  Positive for back pain and neck pain.  All other systems reviewed and are negative.      Objective:   Physical Exam Vitals and nursing note reviewed.  Constitutional:      Appearance: Normal appearance.  Cardiovascular:     Rate and Rhythm: Normal rate and regular rhythm.     Pulses: Normal pulses.     Heart sounds: Normal heart sounds.  Pulmonary:     Effort: Pulmonary effort is normal.     Breath sounds: Normal breath sounds.  Musculoskeletal:     Comments: Normal Muscle Bulk and Muscle Testing Reveals:  Upper Extremities: Full ROM and Muscle Strength 5/5  Lumbar Paraspinal Tenderness: L-4-L-5 Left Greater Trochanter Tenderness Lower Extremities: Full ROM and Muscle Strength 5/5 Arises from Chair with ease Narrow Based  Gait     Skin:    General: Skin is warm and dry.  Neurological:     General: No focal deficit present.     Mental Status: He is alert and oriented to person, place, and time.  Psychiatric:        Mood and Affect: Mood normal.        Behavior: Behavior normal.          Assessment & Plan:  1. Cervicalgia: S/P Fall: No Falls this month. No complaints today. Continue HEP as Tolerated. Continue to Monitor.   Continue to Monitor. 11/18/2023 2. Right Elbow  Pain: No complaints today.Ortho Following. Continue to Monitor. 11/18/2023 3.Chronic left inguinal pain: No complaints today. S/P inguinal hernia (indirect) and mesh repair. Continue to Monitor. 08/28//2025. Refilled: Hydrocodone  10/325 mg one tablet  twice a day  as needed #55.Second script sent for the following month. We will continue the opioid monitoring program, this consists of regular clinic visits, examinations, urine drug screen, pill counts as well as use of [place]  Controlled Substance Reporting system. A 12 month History has been reviewed on the [place]  Controlled Substance Reporting System on 11/18/2023.  4. Lumbosacral Spondylosis: Continue with Home Exercise regime. Continue to Monitor. 11/18/2023 5. Muscle Spasm: Continue current medication regimen with Flexeril . Continue to monitor.11/18/2023 6..Chronic Right Shoulder Pain :No complaints today.  Chronic Pain Syndrome:  Continue HEP as tolerated.  Continue to Monitor. 11/18/2023.  7. Cervicalgia/ Cervical Radiculitis:  No complaints today. Continue HEP as Tolerated. Alternate heat and Ice Therapy as tolerated. Continue current medication regimen. Continue to Monitor. 11/18/2023 8. Left Greater Trochanteric Tenderness:  Alternate with Ice and Heat Therapy. Continue to Monitor. 11/18/2023 9. Left Shoulder Pain: No complaints today. Continue with HEP as tolerated and alternate with Ice  and Heat  Therapy. 11/18/2023 10. Left Radiculitis: Continue  Gabapentin   one capsule twice a day. Continue to Monitor. 11/18/2023    F/U in 2 months

## 2023-11-21 LAB — TOXASSURE SELECT,+ANTIDEPR,UR

## 2023-11-25 ENCOUNTER — Encounter: Payer: Self-pay | Admitting: Registered Nurse

## 2024-01-19 ENCOUNTER — Encounter: Attending: Physical Medicine & Rehabilitation | Admitting: Registered Nurse

## 2024-01-19 ENCOUNTER — Encounter: Payer: Self-pay | Admitting: Registered Nurse

## 2024-01-19 VITALS — BP 134/84 | HR 63 | Ht 68.0 in | Wt 186.0 lb

## 2024-01-19 DIAGNOSIS — M542 Cervicalgia: Secondary | ICD-10-CM | POA: Diagnosis not present

## 2024-01-19 DIAGNOSIS — G894 Chronic pain syndrome: Secondary | ICD-10-CM | POA: Diagnosis not present

## 2024-01-19 DIAGNOSIS — M47817 Spondylosis without myelopathy or radiculopathy, lumbosacral region: Secondary | ICD-10-CM | POA: Diagnosis not present

## 2024-01-19 DIAGNOSIS — Z5181 Encounter for therapeutic drug level monitoring: Secondary | ICD-10-CM | POA: Insufficient documentation

## 2024-01-19 DIAGNOSIS — Z79891 Long term (current) use of opiate analgesic: Secondary | ICD-10-CM | POA: Insufficient documentation

## 2024-01-19 MED ORDER — HYDROCODONE-ACETAMINOPHEN 10-325 MG PO TABS: 1.0000 | ORAL_TABLET | Freq: Two times a day (BID) | ORAL | 0 refills | Status: DC | PRN

## 2024-01-19 NOTE — Progress Notes (Signed)
 Subjective:    Patient ID: Shaun Lang, male    DOB: 01/02/1956, 68 y.o.   MRN: [id number]  HPI: Shaun Lang is a 68 y.o. male who returns for follow up appointment for chronic pain and medication refill.He states his pain is located in his neck and lower back. He rates his pain 8. His current exercise regime is walking and performing stretching exercises.  Shaun Lang reports when he was the gym, he was moving his neck around and became dizzy. He didn't check his blood pressure, he reports cervical pain, Cervical X-ray ordered , he verbalizes understanding. He was instructed to check his blood pressure and F/U with his PCP. He verbalizes understanding.   Shaun Lang equivalent is 19.64 MME.   Last UDS was Performed on 11/18/2023, it was consistent.   Pain Inventory Average Pain 9 Pain Right Now 8 My pain is sharp, burning, stabbing, tingling, and aching  In the last 24 hours, has pain interfered with the following? General activity 3 Relation with others 2 Enjoyment of life 4 What TIME of day is your pain at its worst? morning , daytime, evening, and night Sleep (in general) Poor  Pain is worse with: walking, bending, sitting, inactivity, and some activites Pain improves with: medication Relief from Meds: 4  Family History  Problem Relation Age of Onset   Diabetes Mother    Cancer Sister        breast   Diabetes Sister    Stroke Sister 64   Breast cancer Sister    Colon cancer Neg Hx    Esophageal cancer Neg Hx    Rectal cancer Neg Hx    Stomach cancer Neg Hx    Social History   Socioeconomic History   Marital status: Single    Spouse name: Not on file   Number of children: Not on file   Years of education: Not on file   Highest education level: Not on file  Occupational History    Comment: unemployed  Tobacco Use   Smoking status: Former    Average packs/day: 1 pack/day for 57.8 years (57.8 ttl pk-yrs)    Types: Cigarettes    Start date: 03/24/1965    Smokeless tobacco: Never  Vaping Use   Vaping status: Every Day   Substances: Nicotine , Flavoring  Substance and Sexual Activity   Alcohol  use: Yes    Comment: occassionally   Drug use: No   Sexual activity: Yes    Birth control/protection: None  Other Topics Concern   Not on file  Social History Narrative   Not on file   Social Drivers of Health   Financial Resource Strain: Low Risk  (09/29/2023)   Overall Financial Resource Strain (CARDIA)    Difficulty of Paying Living Expenses: Not hard at all  Food Insecurity: No Food Insecurity (09/29/2023)   Hunger Vital Sign    Worried About Running Out of Food in the Last Year: Never true    Ran Out of Food in the Last Year: Never true  Transportation Needs: No Transportation Needs (09/29/2023)   PRAPARE - Administrator, Civil Service (Medical): No    Lack of Transportation (Non-Medical): No  Physical Activity: Insufficiently Active (09/29/2023)   Exercise Vital Sign    Days of Exercise per Week: 4 days    Minutes of Exercise per Session: 30 min  Stress: No Stress Concern Present (09/29/2023)   [employer] of Occupational Health - Occupational Stress Questionnaire  Feeling of Stress: Not at all  Social Connections: Socially Isolated (09/29/2023)   Social Connection and Isolation Panel    Frequency of Communication with Friends and Family: Three times a week    Frequency of Social Gatherings with Friends and Family: Three times a week    Attends Religious Services: Never    Active Member of Clubs or Organizations: No    Attends Banker Meetings: Never    Marital Status: Divorced   Past Surgical History:  Procedure Laterality Date   BUNIONECTOMY Left 07/15/2017   with big toe fracture repair   COLONOSCOPY  07/18/2012   polyps-Dr.Outlaw   [facility]ERN[facility] REPAIR  04/1998   [facility]   NECK SURGERY  07/21/02 and 01/2006   WRIST SURGERY  1992   right   Past Surgical History:  Procedure Laterality Date   BUNIONECTOMY  Left 07/15/2017   with big toe fracture repair   COLONOSCOPY  07/18/2012   polyps-Dr.Outlaw   HERNIA REPAIR  04/1998   [facility]   NECK SURGERY  07/21/02 and 01/2006   WRIST SURGERY  1992   right   Past Medical History:  Diagnosis Date   Bell's palsy 06/2008   Chest pain with low risk for cardiac etiology    Low Risk Myoview Sept 2013   DJD (degenerative joint disease)    disabilty secondary to neck issues   Excessive daytime sleepiness 09/11/2021   Hyperlipidemia    Hypertension    Ilioinguinal neuralgia 06/01/2012   Sleep apnea    does not use CPAP   Smoker    BP 134/84   Pulse 63   Ht 5' 8 (1.727 m)   Wt 186 lb (84.4 kg)   SpO2 100%   BMI 28.28 kg/m   Opioid Risk Score:   Fall Risk Score:  `1  Depression screen [facility] 2/9     09/29/2023   11:05 AM 07/22/2023    9:35 AM 07/21/2023    1:05 PM 05/26/2023    9:22 AM 03/31/2023    9:07 AM 01/19/2023   11:40 AM 12/09/2022   11:39 AM  Depression screen PHQ 2/9  Decreased Interest 0 0 0 0 0 0 0  Down, Depressed, Hopeless 0 0 0 0 0 0 0  PHQ - 2 Score 0 0 0 0 0 0 0      Review of Systems  Musculoskeletal:  Positive for back pain and neck pain.  All other systems reviewed and are negative.      Objective:   Physical Exam Vitals and nursing note reviewed.  Constitutional:      Appearance: Normal appearance.  Neck:     Comments: Cervical Paraspinal Tenderness: C-4-C-6 Mainly Left Side  Cardiovascular:     Rate and Rhythm: Normal rate and regular rhythm.     Pulses: Normal pulses.     Heart sounds: Normal heart sounds.  Musculoskeletal:     Comments: Normal Muscle Bulk and Muscle Testing Reveals:  Upper Extremities:Full  ROM and Muscle Strength 5/5  Lumbar Paraspinal Tenderness: L-4-L-5 Lower Extremities: Full ROM and Muscle Strength 5/5 Arises from Chair with ease Narrow Based  Gait     Skin:    General: Skin is warm and dry.  Neurological:     Mental Status: He is alert and oriented to person, place, and time.   Psychiatric:        Mood and Affect: Mood normal.        Behavior: Behavior normal.  Assessment & Plan:  1. Cervicalgia: RX: Cervical X-ray Continue HEP as Tolerated. Continue to Monitor. 01/19/2024 2. Right Elbow Pain: No complaints today.Ortho Following. Continue to Monitor. 01/19/2024 3.Chronic left inguinal pain: No complaints today. S/P inguinal hernia (indirect) and mesh repair. Continue to Monitor. 10/29//2025. Refilled: Hydrocodone  10/325 mg one tablet  twice a day  as needed #55.Second script sent for the following month. We will continue the opioid monitoring program, this consists of regular clinic visits, examinations, urine drug screen, pill counts as well as use of [place]  Controlled Substance Reporting system. A 12 month History has been reviewed on the [place]  Controlled Substance Reporting System on 01/19/2024.  4. Lumbosacral Spondylosis: Continue with Home Exercise regime. Continue to Monitor. 01/19/2024 5. Muscle Spasm: Continue current medication regimen with Flexeril . Continue to monitor.01/19/2024 6..Chronic Right Shoulder Pain :No complaints today.  Chronic Pain Syndrome:  Continue HEP as tolerated.  Continue to Monitor. 01/19/2024.  7. Cervicalgia/ Cervical Radiculitis:  No complaints of cervical radicular pain. Continue HEP as Tolerated. Alternate heat and Ice Therapy as tolerated. Continue current medication regimen. Continue to Monitor. 01/19/2024 8. Left Greater Trochanteric Tenderness:  No complaints today.  Alternate with Ice and Heat Therapy. Continue to Monitor. 01/19/2024 9. Left Shoulder Pain: No complaints today. Continue with HEP as tolerated and alternate with Ice  and Heat  Therapy. 01/19/2024 10. Left Radiculitis: Continue  Gabapentin   one capsule twice a day. Continue to Monitor. 01/19/2024    F/U in 2 months

## 2024-03-21 ENCOUNTER — Encounter: Payer: Self-pay | Admitting: Registered Nurse

## 2024-03-21 ENCOUNTER — Encounter: Attending: Physical Medicine & Rehabilitation | Admitting: Registered Nurse

## 2024-03-21 VITALS — BP 144/76 | HR 67 | Ht 68.0 in | Wt 189.0 lb

## 2024-03-21 DIAGNOSIS — Z5181 Encounter for therapeutic drug level monitoring: Secondary | ICD-10-CM | POA: Insufficient documentation

## 2024-03-21 DIAGNOSIS — M47817 Spondylosis without myelopathy or radiculopathy, lumbosacral region: Secondary | ICD-10-CM | POA: Diagnosis present

## 2024-03-21 DIAGNOSIS — G894 Chronic pain syndrome: Secondary | ICD-10-CM | POA: Diagnosis present

## 2024-03-21 DIAGNOSIS — Z79891 Long term (current) use of opiate analgesic: Secondary | ICD-10-CM | POA: Insufficient documentation

## 2024-03-21 DIAGNOSIS — M542 Cervicalgia: Secondary | ICD-10-CM | POA: Diagnosis present

## 2024-03-21 MED ORDER — HYDROCODONE-ACETAMINOPHEN 10-325 MG PO TABS: 1.0000 | ORAL_TABLET | Freq: Two times a day (BID) | ORAL | 0 refills | Status: DC | PRN

## 2024-03-21 MED ORDER — GABAPENTIN 300 MG PO CAPS: 300.0000 mg | ORAL_CAPSULE | Freq: Two times a day (BID) | ORAL | 2 refills | Status: AC

## 2024-03-21 MED ORDER — HYDROCODONE-ACETAMINOPHEN 10-325 MG PO TABS: 1.0000 | ORAL_TABLET | Freq: Two times a day (BID) | ORAL | 0 refills | Status: AC | PRN

## 2024-03-21 NOTE — Progress Notes (Signed)
 "  Subjective:    Patient ID: Shaun Lang, male    DOB: 24-Oct-1955, 68 y.o.   MRN: [id number]  HPI: Shaun Lang is a 69 y.o. male who returns for follow up appointment for chronic pain and medication refill. He states his pain is located in his neck and lower back pain. He rates his pain 8. His current exercise regime is walking and performing stretching exercises.  Mr. Jipson Morphine equivalent is 19.64 MME.   Oral Swab was Performed today.      Pain Inventory Average Pain 8 Pain Right Now 8 My pain is intermittent, sharp, burning, dull, stabbing, and aching  In the last 24 hours, has pain interfered with the following? General activity 7 Relation with others 8 Enjoyment of life 8 What TIME of day is your pain at its worst? morning , daytime, and night Sleep (in general) Poor  Pain is worse with: bending, sitting, and standing Pain improves with: medication Relief from Meds: 6       Family History  Problem Relation Age of Onset   Diabetes Mother    Cancer Sister        breast   Diabetes Sister    Stroke Sister 66   Breast cancer Sister    Colon cancer Neg Hx    Esophageal cancer Neg Hx    Rectal cancer Neg Hx    Stomach cancer Neg Hx    Social History   Socioeconomic History   Marital status: Single    Spouse name: Not on file   Number of children: Not on file   Years of education: Not on file   Highest education level: Not on file  Occupational History    Comment: unemployed  Tobacco Use   Smoking status: Former    Average packs/day: 1 pack/day for 57.8 years (57.8 ttl pk-yrs)    Types: Cigarettes    Start date: 03/24/1965   Smokeless tobacco: Never  Vaping Use   Vaping status: Every Day   Substances: Nicotine , Flavoring  Substance and Sexual Activity   Alcohol  use: Yes    Comment: occassionally   Drug use: No   Sexual activity: Yes    Birth control/protection: None  Other Topics Concern   Not on file  Social History Narrative   Not on file    Social Drivers of Health   Tobacco Use: Medium Risk (01/19/2024)   Patient History    Smoking Tobacco Use: Former    Smokeless Tobacco Use: Never    Passive Exposure: Not on Actuary Strain: Low Risk (09/29/2023)   Overall Financial Resource Strain (CARDIA)    Difficulty of Paying Living Expenses: Not hard at all  Food Insecurity: No Food Insecurity (09/29/2023)   Epic    Worried About Radiation Protection Practitioner of Food in the Last Year: Never true    Ran Out of Food in the Last Year: Never true  Transportation Needs: No Transportation Needs (09/29/2023)   Epic    Lack of Transportation (Medical): No    Lack of Transportation (Non-Medical): No  Physical Activity: Insufficiently Active (09/29/2023)   Exercise Vital Sign    Days of Exercise per Week: 4 days    Minutes of Exercise per Session: 30 min  Stress: No Stress Concern Present (09/29/2023)   [employer] of Occupational Health - Occupational Stress Questionnaire    Feeling of Stress: Not at all  Social Connections: Socially Isolated (09/29/2023)   Social Connection and Isolation Panel  Frequency of Communication with Friends and Family: Three times a week    Frequency of Social Gatherings with Friends and Family: Three times a week    Attends Religious Services: Never    Active Member of Clubs or Organizations: No    Attends Banker Meetings: Never    Marital Status: Divorced  Depression (PHQ2-9): Low Risk (09/29/2023)   Depression (PHQ2-9)    PHQ-2 Score: 0  Alcohol  Screen: Low Risk (09/29/2023)   Alcohol  Screen    Last Alcohol  Screening Score (AUDIT): 1  Housing: Unknown (09/29/2023)   Epic    Unable to Pay for Housing in the Last Year: No    Number of Times Moved in the Last Year: Not on file    Homeless in the Last Year: No  Utilities: Not At Risk (09/29/2023)   Epic    Threatened with loss of utilities: No  Health Literacy: Adequate Health Literacy (09/29/2023)   B1300 Health Literacy    Frequency of need  for help with medical instructions: Never   Past Surgical History:  Procedure Laterality Date   BUNIONECTOMY Left 07/15/2017   with big toe fracture repair   COLONOSCOPY  07/18/2012   polyps-Dr.Outlaw   HERNIA REPAIR  04/1998   [facility]   NECK SURGERY  07/21/02 and 01/2006   WRIST SURGERY  1992   right   Past Medical History:  Diagnosis Date   Bell's palsy 06/2008   Chest pain with low risk for cardiac etiology    Low Risk Myoview Sept 2013   DJD (degenerative joint disease)    disabilty secondary to neck issues   Excessive daytime sleepiness 09/11/2021   Hyperlipidemia    Hypertension    Ilioinguinal neuralgia 06/01/2012   Sleep apnea    does not use CPAP   Smoker    There were no vitals taken for this visit.  Opioid Risk Score:   Fall Risk Score:  `1  Depression screen [facility] 2/9     09/29/2023   11:05 AM 07/22/2023    9:35 AM 07/21/2023    1:05 PM 05/26/2023    9:22 AM 03/31/2023    9:07 AM 01/19/2023   11:40 AM 12/09/2022   11:39 AM  Depression screen PHQ 2/9  Decreased Interest 0 0 0 0 0 0 0  Down, Depressed, Hopeless 0 0 0 0 0 0 0  PHQ - 2 Score 0 0 0 0 0 0 0    Review of Systems  Musculoskeletal:  Positive for back pain and neck pain.  All other systems reviewed and are negative.      Objective:   Physical Exam Vitals and nursing note reviewed.  Constitutional:      Appearance: Normal appearance.  Cardiovascular:     Rate and Rhythm: Normal rate and regular rhythm.     Pulses: Normal pulses.     Heart sounds: Normal heart sounds.  Pulmonary:     Effort: Pulmonary effort is normal.     Breath sounds: Normal breath sounds.  Musculoskeletal:     Comments: Normal Muscle Bulk and Muscle Testing Reveals:  Upper Extremities: Full ROM and Muscle Strength 5/5  Lumbar Paraspinal Tenderness: L-4-L-5 Lower Extremities: Full ROM and Muscle Strength 5/5 Arises from Table with ease Narrow Based  Gait     Skin:    General: Skin is warm and dry.  Neurological:      Mental Status: He is alert and oriented to person, place, and time.  Psychiatric:  Mood and Affect: Mood normal.        Behavior: Behavior normal.          Assessment & Plan:  1. Cervicalgia: Continue HEP as Tolerated. Continue to Monitor. 03/21/2024 2. Right Elbow Pain: No complaints today.Ortho Following. Continue to Monitor. 03/11/2024 3.Chronic left inguinal pain: No complaints today. S/P inguinal hernia (indirect) and mesh repair. Continue to Monitor. 12/30//2025. Refilled: Hydrocodone  10/325 mg one tablet  twice a day  as needed #55.Second script sent for the following month. We will continue the opioid monitoring program, this consists of regular clinic visits, examinations, urine drug screen, pill counts as well as use of [place]  Controlled Substance Reporting system. A 12 month History has been reviewed on the [place]  Controlled Substance Reporting System on 03/21/2024.  4. Lumbosacral Spondylosis: Continue with Home Exercise regime. Continue to Monitor. 03/21/2024 5. Muscle Spasm: Continue current medication regimen with Flexeril . Continue to monitor.03/21/2024 6..Chronic Right Shoulder Pain :No complaints today.  Chronic Pain Syndrome:  Continue HEP as tolerated.  Continue to Monitor. 03/21/2024.  7. Cervicalgia/ Cervical Radiculitis:  No complaints of cervical radicular pain. Continue HEP as Tolerated. Alternate heat and Ice Therapy as tolerated. Continue current medication regimen. Continue to Monitor. 03/21/2024 8. Left Greater Trochanteric Tenderness:  No complaints today.  Alternate with Ice and Heat Therapy. Continue to Monitor. 03/21/2024 9. Left Shoulder Pain: No complaints today. Continue with HEP as tolerated and alternate with Ice  and Heat  Therapy. 03/21/2024 10. Left Radiculitis: Continue  Gabapentin   one capsule twice a day. Continue to Monitor. 03/21/2024    F/U in 2 months       "

## 2024-03-24 LAB — DRUG TOX MONITOR 1 W/CONF, ORAL FLD
Amphetamines: NEGATIVE ng/mL
Barbiturates: NEGATIVE ng/mL
Benzodiazepines: NEGATIVE ng/mL
Buprenorphine: NEGATIVE ng/mL
Cocaine: NEGATIVE ng/mL
Codeine: NEGATIVE ng/mL
Cotinine: >250 ng/mL — ABNORMAL HIGH
Dihydrocodeine: NEGATIVE ng/mL
Fentanyl: NEGATIVE ng/mL
Heroin Metabolite: NEGATIVE ng/mL
Hydrocodone: 7.1 ng/mL — ABNORMAL HIGH
Hydromorphone: NEGATIVE ng/mL
MARIJUANA: NEGATIVE ng/mL
MDMA: NEGATIVE ng/mL
Meprobamate: NEGATIVE ng/mL
Methadone: NEGATIVE ng/mL
Morphine: NEGATIVE ng/mL
Nicotine Metabolite: POSITIVE ng/mL — AB
Norhydrocodone: NEGATIVE ng/mL
Noroxycodone: NEGATIVE ng/mL
Opiates: POSITIVE ng/mL — AB
Oxycodone: NEGATIVE ng/mL
Oxymorphone: NEGATIVE ng/mL
Phencyclidine: NEGATIVE ng/mL
Tapentadol: NEGATIVE ng/mL
Tramadol: NEGATIVE ng/mL
Zolpidem: NEGATIVE ng/mL

## 2024-03-24 LAB — DRUG TOX ALC METAB W/CON, ORAL FLD: Alcohol Metabolite: NEGATIVE ng/mL

## 2024-05-17 ENCOUNTER — Encounter: Admitting: Registered Nurse

## 2024-09-29 ENCOUNTER — Ambulatory Visit
